# Patient Record
Sex: Female | Born: 1950 | Race: White | Hispanic: No | Marital: Married | Smoking: Never smoker
Health system: Southern US, Community
[De-identification: ages and names within clinical notes are randomized; demographics above are authoritative.]

## PROBLEM LIST (undated history)

## (undated) DIAGNOSIS — I509 Heart failure, unspecified: Secondary | ICD-10-CM

## (undated) DIAGNOSIS — F419 Anxiety disorder, unspecified: Secondary | ICD-10-CM

## (undated) DIAGNOSIS — F329 Major depressive disorder, single episode, unspecified: Secondary | ICD-10-CM

## (undated) DIAGNOSIS — Z973 Presence of spectacles and contact lenses: Secondary | ICD-10-CM

## (undated) DIAGNOSIS — Q760 Spina bifida occulta: Secondary | ICD-10-CM

## (undated) DIAGNOSIS — I10 Essential (primary) hypertension: Secondary | ICD-10-CM

## (undated) DIAGNOSIS — Z972 Presence of dental prosthetic device (complete) (partial): Secondary | ICD-10-CM

## (undated) DIAGNOSIS — G629 Polyneuropathy, unspecified: Secondary | ICD-10-CM

## (undated) DIAGNOSIS — M1990 Unspecified osteoarthritis, unspecified site: Secondary | ICD-10-CM

## (undated) DIAGNOSIS — Z9289 Personal history of other medical treatment: Secondary | ICD-10-CM

## (undated) DIAGNOSIS — N189 Chronic kidney disease, unspecified: Secondary | ICD-10-CM

## (undated) DIAGNOSIS — F32A Depression, unspecified: Secondary | ICD-10-CM

## (undated) DIAGNOSIS — E119 Type 2 diabetes mellitus without complications: Secondary | ICD-10-CM

## (undated) HISTORY — PX: OTHER SURGICAL HISTORY: SHX169

## (undated) HISTORY — PX: WISDOM TOOTH EXTRACTION: SHX21

## (undated) HISTORY — DX: Essential (primary) hypertension: I10

## (undated) HISTORY — DX: Type 2 diabetes mellitus without complications: E11.9

## (undated) HISTORY — DX: Spina bifida occulta: Q76.0

---

## 2014-07-08 ENCOUNTER — Encounter: Payer: Self-pay | Admitting: Nutrition

## 2014-07-08 ENCOUNTER — Encounter: Payer: Medicare Other | Attending: "Endocrinology | Admitting: Nutrition

## 2014-07-08 VITALS — Ht 66.0 in | Wt 247.0 lb

## 2014-07-08 DIAGNOSIS — E119 Type 2 diabetes mellitus without complications: Secondary | ICD-10-CM | POA: Insufficient documentation

## 2014-07-08 DIAGNOSIS — E1165 Type 2 diabetes mellitus with hyperglycemia: Secondary | ICD-10-CM

## 2014-07-08 DIAGNOSIS — E118 Type 2 diabetes mellitus with unspecified complications: Secondary | ICD-10-CM

## 2014-07-08 DIAGNOSIS — N183 Chronic kidney disease, stage 3 unspecified: Secondary | ICD-10-CM

## 2014-07-08 DIAGNOSIS — Z6839 Body mass index (BMI) 39.0-39.9, adult: Secondary | ICD-10-CM | POA: Insufficient documentation

## 2014-07-08 DIAGNOSIS — Z713 Dietary counseling and surveillance: Secondary | ICD-10-CM | POA: Insufficient documentation

## 2014-07-08 DIAGNOSIS — IMO0002 Reserved for concepts with insufficient information to code with codable children: Secondary | ICD-10-CM

## 2014-07-08 DIAGNOSIS — Z794 Long term (current) use of insulin: Secondary | ICD-10-CM | POA: Insufficient documentation

## 2014-07-08 NOTE — Progress Notes (Signed)
Medical Nutrition Therapy:  Appt start time: 1330 end time:  1500.  Assessment:  Primary concerns today: Diabetes. Most recent A1C was 7.8%.  Lost 35 bls in the past by following a very low carb high protein diet. 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 Has cut out one of her herbs that she feels may be making her kidneys worse. Is frustrated trying to figure out what to eat to help improve her blood sugars. Carbs are insuffient at meal times. Eats whole grains and fresh fruits and vegeables as she grow a garden and sells produce. Has eaten a lot more starchy vegetables and complex carbs over the winter months. Avoids processed foods and fast foods. Has Native [place] heritage.  Taking Levemir 20 units once a day accurately.  Most  meals are baked and broiled and grilled. Eats a lot of soups and stews. Grows a lot of her own vegetables.    BS 189-200's in am. Before lunch 140-160 and before supper 170-277 and bedtime in the upper 200's.    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    Has never been educated by an RD or CDE before. Diagnosed three years ago.    Has CKD and HTN. Doesn't eat a lot of salty or processed foods.  LDL elevated at 118 mg/dl. Cardiovascular risk factors.  Preferred Learning Style:  Auditory  Visual  Hands on  Learning Readiness:  Ready to make changes to improve her diabetes and cardiovascular risk.    Change in progress  MEDICATIONS: See list   DIETARY INTAKE:  24-hr recall:  B ( AM): 1-2 eggs with cheese or  Bacon, ezekiel bread, 1/2 of grapefruit. Snk ( AM): none  L ( PM): Toss salad, olives, avacado with dressing, with egg salad and 4 crackers, 4 shrimp, water and 1 cup hot tea sweetened stevia. Snk ( PM): none D ( PM): SHrimp , tomatoes, onions, and polenta 1/4 c, asparagus. 1/2 apple. Snk ( PM) :Beverages: water Usual physical activity:  gardening,  Estimated energy needs: 1600 calories 180 g carbohydrates 120 g protein 44 g fat  Progress Towards Goal(s):  In progress.   Nutritional Diagnosis:  NB-1.1 Food and nutrition-related knowledge deficit As related to Diabetes.  As evidenced by A1C 7.8%.    Intervention:  Nutrition counseling on diabetes disease, My Plate, CHO Counting, meal planning, portion control, treatment and s/s of hyper/hypoglycemia, complications of DM, target goals for BS and benefits of weight loss for improved BS control and need for cardio exercise of at least 30 minutes most days of the week.. Stressed need for a low fat high fiber low sodium diet based on cardiovascular risk factors. Goals: 1. Cut out graprefruit products as it may interfere with medications. 2. Eat 30-45 g Carbs per meal. Don't skip meals. Try to eat 4-5 hours apart. 3. Follow the Plate Method as discussed. 4. Increase physical activity to 30 minutes at least three times per week for needed weight loss. 5. Avoid snacks. 6. Measure foods out for accuracy of Carb intake. 7. Continue to test blood sugars as instructed. 8. Keep a food journal x  1 month. 9. Lose 1 lb per week 10. Get A1C down to 7% in 3-6 months.  Teaching Method Utilized:  Visual Auditory Hands on  Handouts given during visit include:  The Plate Method  The Carb Counting Book  Diabetes and You book  The Meal Plan Card  Barriers to learning/adherence to lifestyle change: none  Demonstrated degree of understanding via:  Teach Back   Monitoring/Evaluation:  Dietary intake, exercise, meal planning, food journal and body weight in 1 month(s).

## 2014-07-08 NOTE — Patient Instructions (Signed)
Goals: 1. Cut out graprefruit products as it may interfere with medications. 2. Eat 30-45 g Carbs per meal. Don't skip meals. Try to eat 4-5 hours apart. 3. Follow the Plate Method as discussed. 4. Increase physical activity to 30 minutes at least three times per week for needed weight loss. 5. Avoid snacks. 6. Measure foods out for accuracy of Carb intake. 7. Continue to test blood sugars as instructed. 8. Keep a food journal x  1 month. 9. Lose 1 lb per week 10. Get A1C down to 7% in 3-6 months.

## 2014-07-16 ENCOUNTER — Ambulatory Visit: Payer: Medicare Other | Admitting: Nutrition

## 2014-07-31 ENCOUNTER — Ambulatory Visit: Payer: Medicare Other | Admitting: Nutrition

## 2014-08-20 ENCOUNTER — Encounter: Payer: Medicare Other | Attending: "Endocrinology | Admitting: Nutrition

## 2014-08-20 VITALS — Ht 66.0 in | Wt 243.5 lb

## 2014-08-20 DIAGNOSIS — Z713 Dietary counseling and surveillance: Secondary | ICD-10-CM | POA: Insufficient documentation

## 2014-08-20 DIAGNOSIS — E1165 Type 2 diabetes mellitus with hyperglycemia: Secondary | ICD-10-CM

## 2014-08-20 DIAGNOSIS — Z794 Long term (current) use of insulin: Secondary | ICD-10-CM | POA: Insufficient documentation

## 2014-08-20 DIAGNOSIS — E118 Type 2 diabetes mellitus with unspecified complications: Secondary | ICD-10-CM

## 2014-08-20 DIAGNOSIS — N183 Chronic kidney disease, stage 3 (moderate): Secondary | ICD-10-CM | POA: Insufficient documentation

## 2014-08-20 DIAGNOSIS — E119 Type 2 diabetes mellitus without complications: Secondary | ICD-10-CM | POA: Diagnosis present

## 2014-08-20 DIAGNOSIS — Z6839 Body mass index (BMI) 39.0-39.9, adult: Secondary | ICD-10-CM | POA: Insufficient documentation

## 2014-08-20 DIAGNOSIS — IMO0002 Reserved for concepts with insufficient information to code with codable children: Secondary | ICD-10-CM

## 2014-08-20 NOTE — Patient Instructions (Signed)
Goals: 1. Increase more low carb vegetables with meals. 2. Eat 30-45 g Carbs per meal. Don't skip meals. Try to eat 4-5 hours apart. 3. Follow the Plate Method as discussed. 4. Increase physical activity to 30 minutes at least three times per week for needed weight loss. 5. Avoid snacks. 6. Chose apple or other fresh fruits intead of grapes with meal to see if it will help blood sugars. 7. Get A1C down to 7% in three months. 8. Keep testing blood sugars as instructed and take meds and insulin as prescribed.

## 2014-08-20 NOTE — Progress Notes (Signed)
  Medical Nutrition Therapy:  Appt start time: 0815end time:  0845 Assessment:  Primary concerns today: Diabetes follow up. I have been trying to do better. 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 I found out licorice herb makes blood sugars go up. 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 Eating better balanced meals more consistently now.  Most recent A1C was 8.2%.,  Lost 4 lbs. Doesn't like the metformin-thinks it causes her constipation and nausea.. Left ankle issues. FBS log brought in. BS higher at night before bed. Metformin and Levemir 30 units at night.  Food journal reveals she is eating better protein with meals and more consistent CHO. Needs more lower carb veggies and not just lettuce and cucumbers. PHysically  Busy working in [employer] but not exercising otherwise.    Has CKD and HTN.   Preferred Learning Style:  Auditory  Visual  Hands on  Learning Readiness:  Ready to make changes to improve her diabetes and cardiovascular risk.    Change in progress  MEDICATIONS: See list   DIETARY INTAKE:  24-hr recall:  B ( AM): 1 egg, a whole grain toast, and some fruit and occassional piece of homemade sausage. Snk ( AM): none  L ( PM):  Snk ( PM): none D ( PM): SHrimp , tomatoes, onions, and polenta 1/4 c, asparagus. 1/2 apple. Snk ( PM) :Beverages: water Usual physical activity: gardening,  Estimated energy needs: 1600 calories 180 g carbohydrates 120 g protein 44 g fat  Progress Towards Goal(s):  In progress.   Nutritional Diagnosis:  NB-1.1 Food and nutrition-related knowledge deficit As related to Diabetes.  As evidenced by A1C 7.8%.    Intervention:  Nutrition counseling on diabetes disease, My Plate, CHO Counting, meal planning, portion control, treatment and s/s of hyper/hypoglycemia, complications of DM, target goals for BS and benefits of weight loss for improved BS control and need for cardio exercise of at least 30 minutes most days of the  week.. Stressed need for a low fat high fiber low sodium diet based on cardiovascular risk factors. Goals: 1. Increase more low carb vegetables with meals. 2. Eat 30-45 g Carbs per meal. Don't skip meals. Try to eat 4-5 hours apart. 3. Follow the Plate Method as discussed. 4. Increase physical activity to 30 minutes at least three times per week for needed weight loss. 5. Avoid snacks. 6. Chose apple or other fresh fruits intead of grapes with meal to see if it will help blood sugars. 7. Get A1C down to 7% in three months. 8. Keep testing blood sugars as instructed and take meds and insulin as prescribed.  Teaching Method Utilized:  Visual Auditory Hands on  Handouts given during visit include:  The Plate Method  The Carb Counting Book  Diabetes and You book  The Meal Plan Card  Barriers to learning/adherence to lifestyle change: none  Demonstrated degree of understanding via:  Teach Back   Monitoring/Evaluation:  Dietary intake, exercise, meal planning, food journal and body weight in 1 month(s).

## 2015-07-10 HISTORY — PX: OTHER SURGICAL HISTORY: SHX169

## 2015-08-03 ENCOUNTER — Ambulatory Visit (INDEPENDENT_AMBULATORY_CARE_PROVIDER_SITE_OTHER): Payer: Medicare Other | Admitting: Orthopaedic Surgery

## 2015-08-03 ENCOUNTER — Encounter: Payer: Self-pay | Admitting: Orthopaedic Surgery

## 2015-08-03 ENCOUNTER — Ambulatory Visit (INDEPENDENT_AMBULATORY_CARE_PROVIDER_SITE_OTHER): Payer: Medicare Other

## 2015-08-03 VITALS — BP 205/87 | HR 79 | Temp 98.2°F | Ht 68.0 in | Wt 260.0 lb

## 2015-08-03 DIAGNOSIS — M25561 Pain in right knee: Secondary | ICD-10-CM

## 2015-08-03 NOTE — Progress Notes (Signed)
Subjective:    Patient ID: Natalie Friedman, female    DOB: 03-May-1950, 65 y.o.   MRN: [id number]  Knee Pain  The incident occurred more than 1 week ago. The incident occurred at home. The injury mechanism was a twisting injury. The pain is present in the right knee. The quality of the pain is described as aching. The pain is at a severity of 5/10. The pain is moderate. The pain has been worsening since onset. Associated symptoms include an inability to bear weight and a loss of motion. Pertinent negatives include no loss of sensation, muscle weakness, numbness or tingling. The symptoms are aggravated by weight bearing and movement. She has tried heat, immobilization, ice, non-weight bearing, rest and NSAIDs for the symptoms. The treatment provided mild relief.   She had a twisting injury and knee pain on the right in October, 2016.  She was seen by chiropractor and accupunturist.  She had continued pain.  She took Tylenol and used a cane.  Her knee got better by mid January but still hurt with any type of squatting.  She raises herbs on a farm.  Her pain came back several weeks ago.  She was seen in [place] and had x-rays and was told she needed surgery on the knee.  She did not bring in the CD disk of the x-rays.  She has no new trauma, no redness.  She has problems with full extension at times and has feeling of giving way but not actually giving way.  She is getting worse.  She is a diabetic on insulin.  Her last A1C was 7.5.  She had blood drawn this morning.  Her blood sugar was 79 this morning.  She says it is well controlled.  She also has hypertension well controlled.  Review of Systems  HENT: Negative for congestion.   Respiratory: Negative for cough and shortness of breath.   Cardiovascular: Negative for chest pain and leg swelling.  Endocrine: Positive for cold intolerance.  Musculoskeletal: Positive for joint swelling, arthralgias and gait problem.  Allergic/Immunologic: Positive for  environmental allergies.  Neurological: Negative for tingling and numbness.   Past Medical History  Diagnosis Date  . Diabetes mellitus without complication ([place])   . Hypertension   . Occult spina bifida     Past Surgical History  Procedure Laterality Date  . Ectopic      Current Outpatient Prescriptions on File Prior to Visit  Medication Sig Dispense Refill  . insulin detemir (LEVEMIR) 100 UNIT/ML injection Inject 20 Units into the skin at bedtime.    . Omega 3 1000 MG CAPS Take 1,000 mg by mouth.     No current facility-administered medications on file prior to visit.    Social History   Social History  . Marital Status: Unknown    Spouse Name: N/A  . Number of Children: N/A  . Years of Education: N/A   Occupational History  . Not on file.   Social History Main Topics  . Smoking status: Never Smoker   . Smokeless tobacco: Never Used  . Alcohol Use: No  . Drug Use: No  . Sexual Activity: Not on file   Other Topics Concern  . Not on file   Social History Narrative    BP 205/87 mmHg  Pulse 79  Temp(Src) 98.2 F (36.8 C)  Ht 5\' 8"  (1.727 m)  Wt 260 lb (117.935 kg)  BMI 39.54 kg/m2     Objective:   Physical Exam  Constitutional:  She is oriented to person, place, and time. She appears well-developed and well-nourished.  HENT:  Head: Normocephalic and atraumatic.  Eyes: Conjunctivae and EOM are normal. Pupils are equal, round, and reactive to light.  Neck: Normal range of motion. Neck supple.  Cardiovascular: Normal rate, regular rhythm and intact distal pulses.   Pulmonary/Chest: Effort normal.  Abdominal: Soft.  Musculoskeletal: She exhibits tenderness (Pain right knee with ROM 5 to 100 with crepitus and lateral pain.  NV intact.  1+ effusion.  Limp to the right.  Uses a cane.).       Right knee: She exhibits decreased range of motion and effusion. Tenderness found. Lateral joint line tenderness noted.       Legs: Neurological: She is alert and  oriented to person, place, and time. She displays normal reflexes. No cranial nerve deficit. She exhibits normal muscle tone. Coordination normal.  Skin: Skin is warm and dry.  Psychiatric: She has a normal mood and affect. Her behavior is normal. Judgment and thought content normal.    X-rays were done of the right knee and reported separately.  PROCEDURE NOTE:  The patient requests injections of the right knee , verbal consent was obtained.  The right knee was prepped appropriately after time out was performed.   Sterile technique was observed and injection of 1 cc of Depo-Medrol 40 mg with several cc's of plain xylocaine. Anesthesia was provided by ethyl chloride and a 20-gauge needle was used to inject the knee area. The injection was tolerated well.  A band aid dressing was applied.  The patient was advised to apply ice later today and tomorrow to the injection sight as needed.      Assessment & Plan:   Encounter Diagnosis  Name Primary?  . Right knee pain Yes   I told her and showed her the x-rays of the right knee and the findings.  She is wearing out the lateral side of the knee.  She is to get Medicare supplemental insurance and does not want any MRI or consideration of any knee surgery until she obtains that.  I injected the knee.  She has Ultram at home and will start that.  Return to clinic in two weeks.  Call if any problem  Precautions given.

## 2015-08-10 ENCOUNTER — Ambulatory Visit (INDEPENDENT_AMBULATORY_CARE_PROVIDER_SITE_OTHER): Payer: Medicare Other | Admitting: Orthopaedic Surgery

## 2015-08-10 VITALS — BP 191/105 | HR 85 | Temp 100.2°F | Ht 68.0 in | Wt 260.0 lb

## 2015-08-10 DIAGNOSIS — M25561 Pain in right knee: Secondary | ICD-10-CM | POA: Diagnosis not present

## 2015-08-10 NOTE — Patient Instructions (Signed)
Get MRI of the right knee

## 2015-08-10 NOTE — Progress Notes (Signed)
Patient Natalie Friedman, female DOB:12/15/1950, 65 y.o. [id number]  Chief Complaint  Patient presents with  . Follow-up    Right knee pain fell again 08/05/15 and 08/08/15    HPI  Natalie Friedman is a 65 y.o. female who has right knee pain.  I gave her an injection about a week ago. She went out in the garden and tripped over a vine in the ground and fell on the right knee and re-injured it.  She twisted as she fell.  The knee is more painful.  She cannot fully extend the knee now.  She has pain when trying to stand.  She is worse.  She is taking her medicine.  She has no other injury.  She is very painful over the medial joint line.  She has effusion.  I will order a MRI.  HPI  Body mass index is 39.54 kg/(m^2).   Review of Systems  HENT: Negative for congestion.   Respiratory: Negative for cough and shortness of breath.   Cardiovascular: Negative for chest pain and leg swelling.  Endocrine: Positive for cold intolerance.  Musculoskeletal: Positive for joint swelling, arthralgias and gait problem.  Allergic/Immunologic: Positive for environmental allergies.  Neurological: Negative for numbness.    Past Medical History  Diagnosis Date  . Diabetes mellitus without complication ([employer])   . Hypertension   . Occult spina bifida     Past Surgical History  Procedure Laterality Date  . Ectopic      No family history on file.  Social History Social History  Substance Use Topics  . Smoking status: Never Smoker   . Smokeless tobacco: Never Used  . Alcohol Use: No    No Known Allergies  Current Outpatient Prescriptions  Medication Sig Dispense Refill  . amLODipine (NORVASC) 10 MG tablet   0  . insulin detemir (LEVEMIR) 100 UNIT/ML injection Inject 20 Units into the skin at bedtime.    . Omega 3 1000 MG CAPS Take 1,000 mg by mouth.    . traMADol (ULTRAM) 50 MG tablet Take 50 mg by mouth every 6 (six) hours as needed.     No current facility-administered medications for this  visit.     Physical Exam  Blood pressure 191/105, pulse 85, temperature 100.2 F (37.9 C), height 5\' 8"  (1.727 m), weight 260 lb (117.935 kg).  Constitutional: overall normal hygiene, normal nutrition, well developed, normal grooming, normal body habitus. Assistive device:wheelchair  Musculoskeletal: gait and station Limp on right, difficult to stand, muscle tone and strength are normal, no tremors or atrophy is present.  .  Neurological: coordination overall normal.  Deep tendon reflex/nerve stretch intact.  Sensation normal.  Cranial nerves II-XII intact.   Skin:   normal overall no scars, lesions, ulcers or rashes. No psoriasis.  Psychiatric: Alert and oriented x 3.  Recent memory intact, remote memory unclear.  Normal mood and affect. Well groomed.  Good eye contact.  Cardiovascular: overall no swelling, no varicosities, no edema bilaterally, normal temperatures of the legs and arms, no clubbing, cyanosis and good capillary refill.  Lymphatic: palpation is normal.  The right lower extremity is examined:  Inspection:  Thigh:  Non-tender and no defects  Knee has swelling 2+ effusion.                        Joint tenderness is present  Patient is tender over the medial joint line  Lower Leg:  Has normal appearance and no tenderness or defects  Ankle:  Non-tender and no defects  Foot:  Non-tender and no defects Range of Motion:  Knee:  Range of motion is: -5 to 85 with pain, very tender.                        Crepitus is  present  Ankle:  Range of motion is normal. Strength and Tone:  The left lower extremity has normal strength and tone. Stability:  Knee:  The knee has positive medial McMurray.  Ankle:  The ankle is stable.  Left knee negative.  The patient has been educated about the nature of the problem(s) and counseled on treatment options.  The patient appeared to understand what I have discussed and is in agreement with it.  Encounter  Diagnosis  Name Primary?  . Right knee pain Yes    PLAN Call if any problems.  Precautions discussed.  Continue current medications.   Return to clinic after MRI of the right knee.  She has hurt it again and now has lack of full extension and more pain and more swelling.

## 2015-08-17 ENCOUNTER — Ambulatory Visit: Payer: Medicare Other | Admitting: Orthopaedic Surgery

## 2015-08-18 ENCOUNTER — Ambulatory Visit (HOSPITAL_COMMUNITY)
Admission: RE | Admit: 2015-08-18 | Discharge: 2015-08-18 | Disposition: A | Discharge status: Home / Self Care | Payer: Medicare Other | Source: Ambulatory Visit | Attending: Orthopaedic Surgery | Admitting: Orthopaedic Surgery

## 2015-08-18 DIAGNOSIS — M1711 Unilateral primary osteoarthritis, right knee: Secondary | ICD-10-CM | POA: Diagnosis not present

## 2015-08-18 DIAGNOSIS — M25561 Pain in right knee: Secondary | ICD-10-CM | POA: Diagnosis present

## 2015-08-18 DIAGNOSIS — M25461 Effusion, right knee: Secondary | ICD-10-CM | POA: Insufficient documentation

## 2015-08-18 DIAGNOSIS — S82144A Nondisplaced bicondylar fracture of right tibia, initial encounter for closed fracture: Secondary | ICD-10-CM | POA: Diagnosis not present

## 2015-08-18 DIAGNOSIS — S83281A Other tear of lateral meniscus, current injury, right knee, initial encounter: Secondary | ICD-10-CM | POA: Diagnosis not present

## 2015-08-18 DIAGNOSIS — X58XXXA Exposure to other specified factors, initial encounter: Secondary | ICD-10-CM | POA: Diagnosis not present

## 2015-08-19 ENCOUNTER — Encounter: Payer: Self-pay | Admitting: Orthopaedic Surgery

## 2015-08-19 ENCOUNTER — Ambulatory Visit (INDEPENDENT_AMBULATORY_CARE_PROVIDER_SITE_OTHER): Payer: Medicare Other | Admitting: Orthopaedic Surgery

## 2015-08-19 VITALS — BP 197/95 | HR 76 | Temp 98.1°F | Ht 68.0 in | Wt 260.0 lb

## 2015-08-19 DIAGNOSIS — M25561 Pain in right knee: Secondary | ICD-10-CM

## 2015-08-19 DIAGNOSIS — S82141A Displaced bicondylar fracture of right tibia, initial encounter for closed fracture: Secondary | ICD-10-CM

## 2015-08-19 DIAGNOSIS — E119 Type 2 diabetes mellitus without complications: Secondary | ICD-10-CM

## 2015-08-19 DIAGNOSIS — I10 Essential (primary) hypertension: Secondary | ICD-10-CM | POA: Diagnosis not present

## 2015-08-19 DIAGNOSIS — E669 Obesity, unspecified: Secondary | ICD-10-CM

## 2015-08-19 NOTE — Patient Instructions (Signed)
Stay off the right knee.  Use walker or crutches  X-rays of the right knee on return.

## 2015-08-19 NOTE — Progress Notes (Signed)
Patient Natalie Friedman, female DOB:1950-04-17, 64 y.o. [id number]  Chief Complaint  Patient presents with  . Follow-up    Review MRI of right knee    HPI  Natalie Friedman is a 65 y.o. female who had a fall to the right knee and knee pain.  She fell two weeks ago.  Her pain continued and was marked at times.  I had her get a MRI.  It was done yesterday.  Based on the report, she was called in today to be seen.  The MRI shows:  IMPRESSION: 1. Nondisplaced fracture of the anterior 2/3 of the medial tibial plateau without significant step-off. Extensive surrounding marrow edema. 2. Severe osteoarthritis. 3. Degenerative tearing of the lateral meniscus in the anterior horn and probably at the posterior root. 4. Abnormally thickened MCL with some adjacent edema. This could be from chronic remote tear or grade 2 sprain. 5. Severe chondral thinning in the lateral compartment. Moderate medial compartmental chondral thinning with some focal full-thickness cartilage loss. Patellofemoral cartilage cannot be readily assessed due to artifact. 6. Moderate knee effusion.  I went over each item of the MRI report and explained it to her in detail  I used a model to show her.  She asked appropriate questions.  She will stay off the leg and use walker or crutches.  She understands the significance of the injury.  It is stable but she needs to be careful.  Her diabetes controlled nicely.  Her hypertension is controlled.  She was unable to take Toradol and is taking Tylenol and herbs.  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 HPI  Body mass index is 39.54 kg/(m^2).   Review of Systems  HENT: Negative for congestion.   Respiratory: Negative for cough and shortness of breath.   Cardiovascular: Negative for chest pain and leg swelling.  Endocrine: Positive for cold intolerance.  Musculoskeletal: Positive for joint swelling, arthralgias and gait problem.  Allergic/Immunologic: Positive for environmental  allergies.  Neurological: Negative for numbness.    Past Medical History  Diagnosis Date  . Diabetes mellitus without complication ([place])   . Hypertension   . Occult spina bifida     Past Surgical History  Procedure Laterality Date  . Ectopic      History reviewed. No pertinent family history.  Social History Social History  Substance Use Topics  . Smoking status: Never Smoker   . Smokeless tobacco: Never Used  . Alcohol Use: No    No Known Allergies  Current Outpatient Prescriptions  Medication Sig Dispense Refill  . amLODipine (NORVASC) 10 MG tablet   0  . insulin detemir (LEVEMIR) 100 UNIT/ML injection Inject 20 Units into the skin at bedtime.    . Omega 3 1000 MG CAPS Take 1,000 mg by mouth.    . traMADol (ULTRAM) 50 MG tablet Take 50 mg by mouth every 6 (six) hours as needed. Reported on 08/19/2015     No current facility-administered medications for this visit.     Physical Exam  Blood pressure 197/95, pulse 76, temperature 98.1 F (36.7 C), height 5\' 8"  (1.727 m), weight 260 lb (117.935 kg).  Constitutional: overall normal hygiene, normal nutrition, well developed, normal grooming, normal body habitus. Assistive device:wheelchair  Musculoskeletal: gait and station Limp right, muscle tone and strength are normal, no tremors or atrophy is present.  .  Neurological: coordination overall normal.  Deep tendon reflex/nerve stretch intact.  Sensation normal.  Cranial nerves II-XII intact.   Skin:   normal overall no scars, lesions,  ulcers or rashes. No psoriasis.  Psychiatric: Alert and oriented x 3.  Recent memory intact, remote memory unclear.  Normal mood and affect. Well groomed.  Good eye contact.  Cardiovascular: overall no swelling, no varicosities, no edema bilaterally, normal temperatures of the legs and arms, no clubbing, cyanosis and good capillary refill.  Lymphatic: palpation is normal.  The right lower extremity is  examined:  Inspection:  Thigh:  Non-tender and no defects  Knee has swelling 2+ effusion.                        Joint tenderness is present                        Patient is tender over the medial joint line  Lower Leg:  Has normal appearance and no tenderness or defects  Ankle:  Non-tender and no defects  Foot:  Non-tender and no defects Range of Motion:  Knee:  Range of motion is: 0-100                        Crepitus is  present  Ankle:  Range of motion is normal. Strength and Tone:  The right lower extremity has normal strength and tone. Stability:  Knee:  The knee is stable.  Ankle:  The ankle is stable.   The left knee is negative.  The patient has been educated about the nature of the problem(s) and counseled on treatment options.  The patient appeared to understand what I have discussed and is in agreement with it.  Encounter Diagnoses  Name Primary?  . Tibial plateau fracture, right, closed, initial encounter Yes  . Right knee pain   . Diabetes mellitus without complication ([place])   . Essential hypertension   . Obesity     PLAN Call if any problems.  Precautions discussed.  Continue current medications.   Return to clinic 2 weeks  X-rays of the right knee on return. Use walker or crutches. Stay off right leg.

## 2015-08-24 ENCOUNTER — Ambulatory Visit: Payer: Medicare Other | Admitting: Orthopaedic Surgery

## 2015-09-02 ENCOUNTER — Encounter: Payer: Self-pay | Admitting: Orthopaedic Surgery

## 2015-09-02 ENCOUNTER — Ambulatory Visit: Payer: Medicare Other | Admitting: Orthopedic Surgery

## 2015-09-02 ENCOUNTER — Ambulatory Visit (INDEPENDENT_AMBULATORY_CARE_PROVIDER_SITE_OTHER): Payer: Medicare Other | Admitting: Orthopaedic Surgery

## 2015-09-02 ENCOUNTER — Ambulatory Visit (INDEPENDENT_AMBULATORY_CARE_PROVIDER_SITE_OTHER): Payer: Medicare Other

## 2015-09-02 VITALS — BP 148/84 | HR 84 | Temp 99.0°F | Ht 68.0 in | Wt 260.0 lb

## 2015-09-02 DIAGNOSIS — S82141D Displaced bicondylar fracture of right tibia, subsequent encounter for closed fracture with routine healing: Secondary | ICD-10-CM

## 2015-09-02 NOTE — Progress Notes (Signed)
CC:  My knee is better  She has a healing medial tibial plateau fracture nondisplaced on the right doing well.  She has little pain.  She has no new trauma.  ROM is 0 to 110.  NV is intact.  Encounter Diagnosis  Name Primary?  . Tibial plateau fracture, right, closed, with routine healing, subsequent encounter Yes   Return in one month.  Continue no weight bearing.  X-rays on return.  Call if any problem.  Precautions discussed.

## 2015-09-16 DIAGNOSIS — E119 Type 2 diabetes mellitus without complications: Secondary | ICD-10-CM | POA: Diagnosis not present

## 2015-09-16 DIAGNOSIS — H25013 Cortical age-related cataract, bilateral: Secondary | ICD-10-CM | POA: Diagnosis not present

## 2015-09-30 ENCOUNTER — Ambulatory Visit (INDEPENDENT_AMBULATORY_CARE_PROVIDER_SITE_OTHER): Payer: Medicaid Other

## 2015-09-30 ENCOUNTER — Encounter: Payer: Self-pay | Admitting: Orthopaedic Surgery

## 2015-09-30 ENCOUNTER — Ambulatory Visit: Payer: Medicare Other | Admitting: Orthopaedic Surgery

## 2015-09-30 VITALS — BP 175/92 | HR 76 | Temp 98.1°F

## 2015-09-30 DIAGNOSIS — S82141D Displaced bicondylar fracture of right tibia, subsequent encounter for closed fracture with routine healing: Secondary | ICD-10-CM

## 2015-09-30 DIAGNOSIS — M25561 Pain in right knee: Secondary | ICD-10-CM

## 2015-09-30 DIAGNOSIS — E669 Obesity, unspecified: Secondary | ICD-10-CM

## 2015-09-30 DIAGNOSIS — E119 Type 2 diabetes mellitus without complications: Secondary | ICD-10-CM

## 2015-09-30 DIAGNOSIS — I10 Essential (primary) hypertension: Secondary | ICD-10-CM

## 2015-09-30 NOTE — Progress Notes (Signed)
CC:  My knee is better  She has right knee pain that is improving.  She still lacks about 3 to 5 degrees of full extension of the knee.  She has less effusion.  She has no new trauma.  X-rays were done and reported separately.  Encounter Diagnoses  Name Primary?  . Right knee pain Yes  . Tibial plateau fracture, right, closed, with routine healing, subsequent encounter   . Essential hypertension   . Diabetes mellitus without complication ([place])   . Obesity     I have told her to begin weight bearing as tolerated.  Consider water therapy.  Begin PT for the knee.  She will need a total knee in the near future.  We talked about it and risks and imponderables.  She is planning to have it done.  Return in two weeks.  Call if any problem  Precautions discussed.  Electronically Signed Sanjuana Kava, MD 6/22/20174:04 PM

## 2015-10-06 ENCOUNTER — Telehealth: Payer: Self-pay

## 2015-10-06 DIAGNOSIS — M25661 Stiffness of right knee, not elsewhere classified: Secondary | ICD-10-CM | POA: Diagnosis not present

## 2015-10-06 DIAGNOSIS — M25561 Pain in right knee: Secondary | ICD-10-CM | POA: Diagnosis not present

## 2015-10-06 DIAGNOSIS — S82124S Nondisplaced fracture of lateral condyle of right tibia, sequela: Secondary | ICD-10-CM | POA: Diagnosis not present

## 2015-10-06 DIAGNOSIS — M62451 Contracture of muscle, right thigh: Secondary | ICD-10-CM | POA: Diagnosis not present

## 2015-10-14 ENCOUNTER — Ambulatory Visit: Payer: Medicare Other | Admitting: Orthopaedic Surgery

## 2015-10-14 DIAGNOSIS — M62451 Contracture of muscle, right thigh: Secondary | ICD-10-CM | POA: Diagnosis not present

## 2015-10-14 DIAGNOSIS — M25561 Pain in right knee: Secondary | ICD-10-CM | POA: Diagnosis not present

## 2015-10-14 DIAGNOSIS — M25661 Stiffness of right knee, not elsewhere classified: Secondary | ICD-10-CM | POA: Diagnosis not present

## 2015-10-14 DIAGNOSIS — S82124S Nondisplaced fracture of lateral condyle of right tibia, sequela: Secondary | ICD-10-CM | POA: Diagnosis not present

## 2015-10-18 DIAGNOSIS — S82124S Nondisplaced fracture of lateral condyle of right tibia, sequela: Secondary | ICD-10-CM | POA: Diagnosis not present

## 2015-10-18 DIAGNOSIS — M25661 Stiffness of right knee, not elsewhere classified: Secondary | ICD-10-CM | POA: Diagnosis not present

## 2015-10-18 DIAGNOSIS — M25561 Pain in right knee: Secondary | ICD-10-CM | POA: Diagnosis not present

## 2015-10-18 DIAGNOSIS — M62451 Contracture of muscle, right thigh: Secondary | ICD-10-CM | POA: Diagnosis not present

## 2015-10-19 DIAGNOSIS — M62451 Contracture of muscle, right thigh: Secondary | ICD-10-CM | POA: Diagnosis not present

## 2015-10-19 DIAGNOSIS — M25561 Pain in right knee: Secondary | ICD-10-CM | POA: Diagnosis not present

## 2015-10-19 DIAGNOSIS — M25661 Stiffness of right knee, not elsewhere classified: Secondary | ICD-10-CM | POA: Diagnosis not present

## 2015-10-19 DIAGNOSIS — S82124S Nondisplaced fracture of lateral condyle of right tibia, sequela: Secondary | ICD-10-CM | POA: Diagnosis not present

## 2015-10-20 ENCOUNTER — Encounter: Payer: Self-pay | Admitting: Orthopaedic Surgery

## 2015-10-20 ENCOUNTER — Ambulatory Visit: Payer: Medicare Other | Admitting: Orthopaedic Surgery

## 2015-10-20 VITALS — BP 137/89 | HR 84 | Temp 98.4°F | Ht 65.0 in | Wt 256.0 lb

## 2015-10-20 DIAGNOSIS — M25561 Pain in right knee: Secondary | ICD-10-CM

## 2015-10-20 DIAGNOSIS — I10 Essential (primary) hypertension: Secondary | ICD-10-CM

## 2015-10-20 DIAGNOSIS — E119 Type 2 diabetes mellitus without complications: Secondary | ICD-10-CM

## 2015-10-20 DIAGNOSIS — S82141D Displaced bicondylar fracture of right tibia, subsequent encounter for closed fracture with routine healing: Secondary | ICD-10-CM

## 2015-10-20 DIAGNOSIS — E669 Obesity, unspecified: Secondary | ICD-10-CM

## 2015-10-20 NOTE — Progress Notes (Signed)
CC:  I like therapy.  I am better  Her right knee has better motion after going to PT. She still has a ways to go however.  She is using her [place].  She still lacks full extension of the right knee by about 5 degrees actively, she can go to full extension with passive help.  NV is intact.  Encounter Diagnoses  Name Primary?  . Right knee pain Yes  . Tibial plateau fracture, right, closed, with routine healing, subsequent encounter   . Essential hypertension   . Diabetes mellitus without complication ([place])   . Obesity    Continue PT.    I will see her in two weeks.  Call if any problem.  Precautions discussed.  Electronically Signed Sanjuana Kava, MD 7/12/20172:37 PM

## 2015-10-20 NOTE — Patient Instructions (Addendum)
Continue therapy.  Continue to work on straightening the leg.

## 2015-10-21 DIAGNOSIS — M62451 Contracture of muscle, right thigh: Secondary | ICD-10-CM | POA: Diagnosis not present

## 2015-10-21 DIAGNOSIS — S82124S Nondisplaced fracture of lateral condyle of right tibia, sequela: Secondary | ICD-10-CM | POA: Diagnosis not present

## 2015-10-21 DIAGNOSIS — M25561 Pain in right knee: Secondary | ICD-10-CM | POA: Diagnosis not present

## 2015-10-21 DIAGNOSIS — M25661 Stiffness of right knee, not elsewhere classified: Secondary | ICD-10-CM | POA: Diagnosis not present

## 2015-10-25 DIAGNOSIS — S82124S Nondisplaced fracture of lateral condyle of right tibia, sequela: Secondary | ICD-10-CM | POA: Diagnosis not present

## 2015-10-25 DIAGNOSIS — M62451 Contracture of muscle, right thigh: Secondary | ICD-10-CM | POA: Diagnosis not present

## 2015-10-25 DIAGNOSIS — M25561 Pain in right knee: Secondary | ICD-10-CM | POA: Diagnosis not present

## 2015-10-25 DIAGNOSIS — M25661 Stiffness of right knee, not elsewhere classified: Secondary | ICD-10-CM | POA: Diagnosis not present

## 2015-10-26 DIAGNOSIS — M25561 Pain in right knee: Secondary | ICD-10-CM | POA: Diagnosis not present

## 2015-10-26 DIAGNOSIS — M25661 Stiffness of right knee, not elsewhere classified: Secondary | ICD-10-CM | POA: Diagnosis not present

## 2015-10-26 DIAGNOSIS — S82124S Nondisplaced fracture of lateral condyle of right tibia, sequela: Secondary | ICD-10-CM | POA: Diagnosis not present

## 2015-10-26 DIAGNOSIS — M62451 Contracture of muscle, right thigh: Secondary | ICD-10-CM | POA: Diagnosis not present

## 2015-10-28 DIAGNOSIS — M62451 Contracture of muscle, right thigh: Secondary | ICD-10-CM | POA: Diagnosis not present

## 2015-10-28 DIAGNOSIS — S82124S Nondisplaced fracture of lateral condyle of right tibia, sequela: Secondary | ICD-10-CM | POA: Diagnosis not present

## 2015-10-28 DIAGNOSIS — M25561 Pain in right knee: Secondary | ICD-10-CM | POA: Diagnosis not present

## 2015-10-28 DIAGNOSIS — M25661 Stiffness of right knee, not elsewhere classified: Secondary | ICD-10-CM | POA: Diagnosis not present

## 2015-11-01 DIAGNOSIS — M62451 Contracture of muscle, right thigh: Secondary | ICD-10-CM | POA: Diagnosis not present

## 2015-11-01 DIAGNOSIS — M25561 Pain in right knee: Secondary | ICD-10-CM | POA: Diagnosis not present

## 2015-11-01 DIAGNOSIS — S82124S Nondisplaced fracture of lateral condyle of right tibia, sequela: Secondary | ICD-10-CM | POA: Diagnosis not present

## 2015-11-01 DIAGNOSIS — M25661 Stiffness of right knee, not elsewhere classified: Secondary | ICD-10-CM | POA: Diagnosis not present

## 2015-11-02 DIAGNOSIS — M25661 Stiffness of right knee, not elsewhere classified: Secondary | ICD-10-CM | POA: Diagnosis not present

## 2015-11-02 DIAGNOSIS — N183 Chronic kidney disease, stage 3 (moderate): Secondary | ICD-10-CM | POA: Diagnosis not present

## 2015-11-02 DIAGNOSIS — E782 Mixed hyperlipidemia: Secondary | ICD-10-CM | POA: Diagnosis not present

## 2015-11-02 DIAGNOSIS — M25561 Pain in right knee: Secondary | ICD-10-CM | POA: Diagnosis not present

## 2015-11-02 DIAGNOSIS — E119 Type 2 diabetes mellitus without complications: Secondary | ICD-10-CM | POA: Diagnosis not present

## 2015-11-02 DIAGNOSIS — S82124S Nondisplaced fracture of lateral condyle of right tibia, sequela: Secondary | ICD-10-CM | POA: Diagnosis not present

## 2015-11-02 DIAGNOSIS — M62451 Contracture of muscle, right thigh: Secondary | ICD-10-CM | POA: Diagnosis not present

## 2015-11-02 DIAGNOSIS — I10 Essential (primary) hypertension: Secondary | ICD-10-CM | POA: Diagnosis not present

## 2015-11-03 ENCOUNTER — Encounter: Payer: Self-pay | Admitting: Orthopaedic Surgery

## 2015-11-03 ENCOUNTER — Ambulatory Visit: Payer: Medicare Other | Admitting: Orthopaedic Surgery

## 2015-11-03 VITALS — BP 170/85 | HR 82 | Temp 98.1°F | Ht 65.0 in | Wt 251.0 lb

## 2015-11-03 DIAGNOSIS — E669 Obesity, unspecified: Secondary | ICD-10-CM

## 2015-11-03 DIAGNOSIS — M25561 Pain in right knee: Secondary | ICD-10-CM

## 2015-11-03 DIAGNOSIS — E119 Type 2 diabetes mellitus without complications: Secondary | ICD-10-CM

## 2015-11-03 DIAGNOSIS — S82141D Displaced bicondylar fracture of right tibia, subsequent encounter for closed fracture with routine healing: Secondary | ICD-10-CM

## 2015-11-03 DIAGNOSIS — I10 Essential (primary) hypertension: Secondary | ICD-10-CM

## 2015-11-03 NOTE — Progress Notes (Signed)
CC:  My knee is better  She is going to PT and has improved.  She lacks about 15 degrees from full extension of the right knee.  She is riding a stationary bicycle now where before she could not.  Her pain is less.  ROM of right knee is 15 to 90 with some pain.  She has an effusion and edema of both lower legs.  NV is intact.  Encounter Diagnoses  Name Primary?  . Right knee pain Yes  . Tibial plateau fracture, right, closed, with routine healing, subsequent encounter   . Essential hypertension   . Diabetes mellitus without complication ([place])   . Obesity     Return in three weeks.  Continue PT.  Call if any problem.  Electronically Signed Sanjuana Kava, MD 7/26/20172:37 PM

## 2015-11-03 NOTE — Patient Instructions (Signed)
Return in 3 weeks

## 2015-11-04 DIAGNOSIS — S82124S Nondisplaced fracture of lateral condyle of right tibia, sequela: Secondary | ICD-10-CM | POA: Diagnosis not present

## 2015-11-04 DIAGNOSIS — M62451 Contracture of muscle, right thigh: Secondary | ICD-10-CM | POA: Diagnosis not present

## 2015-11-04 DIAGNOSIS — M25561 Pain in right knee: Secondary | ICD-10-CM | POA: Diagnosis not present

## 2015-11-04 DIAGNOSIS — M25661 Stiffness of right knee, not elsewhere classified: Secondary | ICD-10-CM | POA: Diagnosis not present

## 2015-11-08 DIAGNOSIS — M25561 Pain in right knee: Secondary | ICD-10-CM | POA: Diagnosis not present

## 2015-11-08 DIAGNOSIS — M62451 Contracture of muscle, right thigh: Secondary | ICD-10-CM | POA: Diagnosis not present

## 2015-11-08 DIAGNOSIS — S82124S Nondisplaced fracture of lateral condyle of right tibia, sequela: Secondary | ICD-10-CM | POA: Diagnosis not present

## 2015-11-08 DIAGNOSIS — M25661 Stiffness of right knee, not elsewhere classified: Secondary | ICD-10-CM | POA: Diagnosis not present

## 2015-11-09 DIAGNOSIS — M62451 Contracture of muscle, right thigh: Secondary | ICD-10-CM | POA: Diagnosis not present

## 2015-11-09 DIAGNOSIS — M25661 Stiffness of right knee, not elsewhere classified: Secondary | ICD-10-CM | POA: Diagnosis not present

## 2015-11-09 DIAGNOSIS — M25561 Pain in right knee: Secondary | ICD-10-CM | POA: Diagnosis not present

## 2015-11-09 DIAGNOSIS — S82124S Nondisplaced fracture of lateral condyle of right tibia, sequela: Secondary | ICD-10-CM | POA: Diagnosis not present

## 2015-11-11 DIAGNOSIS — M25561 Pain in right knee: Secondary | ICD-10-CM | POA: Diagnosis not present

## 2015-11-11 DIAGNOSIS — M62451 Contracture of muscle, right thigh: Secondary | ICD-10-CM | POA: Diagnosis not present

## 2015-11-11 DIAGNOSIS — S82124S Nondisplaced fracture of lateral condyle of right tibia, sequela: Secondary | ICD-10-CM | POA: Diagnosis not present

## 2015-11-11 DIAGNOSIS — M25661 Stiffness of right knee, not elsewhere classified: Secondary | ICD-10-CM | POA: Diagnosis not present

## 2015-11-15 DIAGNOSIS — M25561 Pain in right knee: Secondary | ICD-10-CM | POA: Diagnosis not present

## 2015-11-15 DIAGNOSIS — S82124S Nondisplaced fracture of lateral condyle of right tibia, sequela: Secondary | ICD-10-CM | POA: Diagnosis not present

## 2015-11-15 DIAGNOSIS — M25661 Stiffness of right knee, not elsewhere classified: Secondary | ICD-10-CM | POA: Diagnosis not present

## 2015-11-15 DIAGNOSIS — M62451 Contracture of muscle, right thigh: Secondary | ICD-10-CM | POA: Diagnosis not present

## 2015-11-16 DIAGNOSIS — S82124S Nondisplaced fracture of lateral condyle of right tibia, sequela: Secondary | ICD-10-CM | POA: Diagnosis not present

## 2015-11-16 DIAGNOSIS — M62451 Contracture of muscle, right thigh: Secondary | ICD-10-CM | POA: Diagnosis not present

## 2015-11-16 DIAGNOSIS — M25661 Stiffness of right knee, not elsewhere classified: Secondary | ICD-10-CM | POA: Diagnosis not present

## 2015-11-16 DIAGNOSIS — M25561 Pain in right knee: Secondary | ICD-10-CM | POA: Diagnosis not present

## 2015-11-18 DIAGNOSIS — S82124S Nondisplaced fracture of lateral condyle of right tibia, sequela: Secondary | ICD-10-CM | POA: Diagnosis not present

## 2015-11-18 DIAGNOSIS — M62451 Contracture of muscle, right thigh: Secondary | ICD-10-CM | POA: Diagnosis not present

## 2015-11-18 DIAGNOSIS — M25661 Stiffness of right knee, not elsewhere classified: Secondary | ICD-10-CM | POA: Diagnosis not present

## 2015-11-18 DIAGNOSIS — M25561 Pain in right knee: Secondary | ICD-10-CM | POA: Diagnosis not present

## 2015-11-22 DIAGNOSIS — S82124S Nondisplaced fracture of lateral condyle of right tibia, sequela: Secondary | ICD-10-CM | POA: Diagnosis not present

## 2015-11-22 DIAGNOSIS — M62451 Contracture of muscle, right thigh: Secondary | ICD-10-CM | POA: Diagnosis not present

## 2015-11-22 DIAGNOSIS — M25561 Pain in right knee: Secondary | ICD-10-CM | POA: Diagnosis not present

## 2015-11-22 DIAGNOSIS — M25661 Stiffness of right knee, not elsewhere classified: Secondary | ICD-10-CM | POA: Diagnosis not present

## 2015-11-23 DIAGNOSIS — S82124S Nondisplaced fracture of lateral condyle of right tibia, sequela: Secondary | ICD-10-CM | POA: Diagnosis not present

## 2015-11-23 DIAGNOSIS — M25561 Pain in right knee: Secondary | ICD-10-CM | POA: Diagnosis not present

## 2015-11-23 DIAGNOSIS — M25661 Stiffness of right knee, not elsewhere classified: Secondary | ICD-10-CM | POA: Diagnosis not present

## 2015-11-23 DIAGNOSIS — M62451 Contracture of muscle, right thigh: Secondary | ICD-10-CM | POA: Diagnosis not present

## 2015-11-24 ENCOUNTER — Encounter: Payer: Self-pay | Admitting: Orthopaedic Surgery

## 2015-11-24 ENCOUNTER — Ambulatory Visit (INDEPENDENT_AMBULATORY_CARE_PROVIDER_SITE_OTHER): Payer: Medicare Other | Admitting: Orthopaedic Surgery

## 2015-11-24 VITALS — BP 123/95 | HR 81 | Temp 98.1°F | Ht 65.5 in | Wt 261.0 lb

## 2015-11-24 DIAGNOSIS — S82141D Displaced bicondylar fracture of right tibia, subsequent encounter for closed fracture with routine healing: Secondary | ICD-10-CM | POA: Diagnosis not present

## 2015-11-24 DIAGNOSIS — M25561 Pain in right knee: Secondary | ICD-10-CM

## 2015-11-24 NOTE — Progress Notes (Signed)
Patient BP:[id number] Natalie Friedman, female DOB:[birth year], 65 y.o. [id number]  Chief Complaint  Patient presents with  . Follow-up    right knee    HPI  Natalie Friedman is a 65 y.o. female who has pain in the right knee post tibial plateau fracture.  She has had lack of full extension of the knee and is now going to PT and doing exercises at home.  She is improving.  She can now walk with the heel touching the ground and lacks about 5 degrees from full extension.  She still limps and uses her crutches but is better and has less pain.  She needs to continue. HPI  Body mass index is 42.77 kg/m.  ROS  Review of Systems  HENT: Negative for congestion.   Respiratory: Negative for cough and shortness of breath.   Cardiovascular: Negative for chest pain and leg swelling.  Endocrine: Positive for cold intolerance.  Musculoskeletal: Positive for arthralgias, gait problem and joint swelling.  Allergic/Immunologic: Positive for environmental allergies.  Neurological: Negative for numbness.    Past Medical History:  Diagnosis Date  . Diabetes mellitus without complication ([place])   . Hypertension   . Occult spina bifida     Past Surgical History:  Procedure Laterality Date  . ectopic      No family history on file.  Social History Social History  Substance Use Topics  . Smoking status: Never Smoker  . Smokeless tobacco: Never Used  . Alcohol use No    No Known Allergies  Current Outpatient Prescriptions  Medication Sig Dispense Refill  . amLODipine (NORVASC) 10 MG tablet   0  . insulin detemir (LEVEMIR) 100 UNIT/ML injection Inject 20 Units into the skin at bedtime.    . Omega 3 1000 MG CAPS Take 1,000 mg by mouth.    . traMADol (ULTRAM) 50 MG tablet Take 50 mg by mouth every 6 (six) hours as needed. Reported on 08/19/2015     No current facility-administered medications for this visit.      Physical Exam  Blood pressure (!) 123/95, pulse 81, temperature 98.1 F (36.7 C),  height 5' 5.5" (1.664 m), weight 261 lb (118.4 kg).  Constitutional: overall normal hygiene, normal nutrition, well developed, normal grooming, normal body habitus. Assistive device:crutches  Musculoskeletal: gait and station Limp right, muscle tone and strength are normal, no tremors or atrophy is present.  .  Neurological: coordination overall normal.  Deep tendon reflex/nerve stretch intact.  Sensation normal.  Cranial nerves II-XII intact.   Skin:   normal overall no scars, lesions, ulcers or rashes. No psoriasis.  Psychiatric: Alert and oriented x 3.  Recent memory intact, remote memory unclear.  Normal mood and affect. Well groomed.  Good eye contact.  Cardiovascular: overall no swelling, no varicosities, no edema bilaterally, normal temperatures of the legs and arms, no clubbing, cyanosis and good capillary refill.  Lymphatic: palpation is normal.  The right knee lacks about 5 degrees of full extension.  She is less tight around the hamstrings and is able to fully put her foot on the ground when walking now.  Previously she had heel rise of about a half inch.  NV is intact.  She has some effusion of the right knee but less.  Pulses normal.    The patient has been educated about the nature of the problem(s) and counseled on treatment options.  The patient appeared to understand what I have discussed and is in agreement with it.  Encounter Diagnoses  Name Primary?  Marland Kitchen  Tibial plateau fracture, right, closed, with routine healing, subsequent encounter Yes  . Right knee pain     PLAN Call if any problems.  Precautions discussed.  Continue current medications.   Return to clinic 3 weeks   Continue PT.  Electronically Signed Sanjuana Kava, MD 8/16/20172:17 PM

## 2015-11-24 NOTE — Progress Notes (Signed)
f °

## 2015-11-24 NOTE — Patient Instructions (Signed)
Continue therapy.  Precautions discussed.

## 2015-11-25 DIAGNOSIS — M25661 Stiffness of right knee, not elsewhere classified: Secondary | ICD-10-CM | POA: Diagnosis not present

## 2015-11-25 DIAGNOSIS — M25561 Pain in right knee: Secondary | ICD-10-CM | POA: Diagnosis not present

## 2015-11-25 DIAGNOSIS — S82124S Nondisplaced fracture of lateral condyle of right tibia, sequela: Secondary | ICD-10-CM | POA: Diagnosis not present

## 2015-11-25 DIAGNOSIS — M62451 Contracture of muscle, right thigh: Secondary | ICD-10-CM | POA: Diagnosis not present

## 2015-11-30 DIAGNOSIS — S82124S Nondisplaced fracture of lateral condyle of right tibia, sequela: Secondary | ICD-10-CM | POA: Diagnosis not present

## 2015-11-30 DIAGNOSIS — M62451 Contracture of muscle, right thigh: Secondary | ICD-10-CM | POA: Diagnosis not present

## 2015-11-30 DIAGNOSIS — M25561 Pain in right knee: Secondary | ICD-10-CM | POA: Diagnosis not present

## 2015-11-30 DIAGNOSIS — M25661 Stiffness of right knee, not elsewhere classified: Secondary | ICD-10-CM | POA: Diagnosis not present

## 2015-12-02 DIAGNOSIS — M25561 Pain in right knee: Secondary | ICD-10-CM | POA: Diagnosis not present

## 2015-12-02 DIAGNOSIS — M25661 Stiffness of right knee, not elsewhere classified: Secondary | ICD-10-CM | POA: Diagnosis not present

## 2015-12-02 DIAGNOSIS — S82124S Nondisplaced fracture of lateral condyle of right tibia, sequela: Secondary | ICD-10-CM | POA: Diagnosis not present

## 2015-12-02 DIAGNOSIS — M62451 Contracture of muscle, right thigh: Secondary | ICD-10-CM | POA: Diagnosis not present

## 2015-12-06 DIAGNOSIS — M62451 Contracture of muscle, right thigh: Secondary | ICD-10-CM | POA: Diagnosis not present

## 2015-12-06 DIAGNOSIS — M25661 Stiffness of right knee, not elsewhere classified: Secondary | ICD-10-CM | POA: Diagnosis not present

## 2015-12-06 DIAGNOSIS — M25561 Pain in right knee: Secondary | ICD-10-CM | POA: Diagnosis not present

## 2015-12-06 DIAGNOSIS — S82124S Nondisplaced fracture of lateral condyle of right tibia, sequela: Secondary | ICD-10-CM | POA: Diagnosis not present

## 2015-12-07 DIAGNOSIS — M62451 Contracture of muscle, right thigh: Secondary | ICD-10-CM | POA: Diagnosis not present

## 2015-12-07 DIAGNOSIS — M25561 Pain in right knee: Secondary | ICD-10-CM | POA: Diagnosis not present

## 2015-12-07 DIAGNOSIS — M25661 Stiffness of right knee, not elsewhere classified: Secondary | ICD-10-CM | POA: Diagnosis not present

## 2015-12-07 DIAGNOSIS — S82124S Nondisplaced fracture of lateral condyle of right tibia, sequela: Secondary | ICD-10-CM | POA: Diagnosis not present

## 2015-12-09 DIAGNOSIS — M25561 Pain in right knee: Secondary | ICD-10-CM | POA: Diagnosis not present

## 2015-12-09 DIAGNOSIS — M62451 Contracture of muscle, right thigh: Secondary | ICD-10-CM | POA: Diagnosis not present

## 2015-12-09 DIAGNOSIS — M25661 Stiffness of right knee, not elsewhere classified: Secondary | ICD-10-CM | POA: Diagnosis not present

## 2015-12-09 DIAGNOSIS — S82124S Nondisplaced fracture of lateral condyle of right tibia, sequela: Secondary | ICD-10-CM | POA: Diagnosis not present

## 2015-12-14 DIAGNOSIS — M62451 Contracture of muscle, right thigh: Secondary | ICD-10-CM | POA: Diagnosis not present

## 2015-12-14 DIAGNOSIS — M25561 Pain in right knee: Secondary | ICD-10-CM | POA: Diagnosis not present

## 2015-12-14 DIAGNOSIS — M25661 Stiffness of right knee, not elsewhere classified: Secondary | ICD-10-CM | POA: Diagnosis not present

## 2015-12-14 DIAGNOSIS — S82124S Nondisplaced fracture of lateral condyle of right tibia, sequela: Secondary | ICD-10-CM | POA: Diagnosis not present

## 2015-12-15 ENCOUNTER — Ambulatory Visit: Payer: Medicare Other | Admitting: Orthopaedic Surgery

## 2015-12-15 DIAGNOSIS — M25661 Stiffness of right knee, not elsewhere classified: Secondary | ICD-10-CM | POA: Diagnosis not present

## 2015-12-15 DIAGNOSIS — M62451 Contracture of muscle, right thigh: Secondary | ICD-10-CM | POA: Diagnosis not present

## 2015-12-15 DIAGNOSIS — M25561 Pain in right knee: Secondary | ICD-10-CM | POA: Diagnosis not present

## 2015-12-15 DIAGNOSIS — S82124S Nondisplaced fracture of lateral condyle of right tibia, sequela: Secondary | ICD-10-CM | POA: Diagnosis not present

## 2015-12-16 DIAGNOSIS — M62451 Contracture of muscle, right thigh: Secondary | ICD-10-CM | POA: Diagnosis not present

## 2015-12-16 DIAGNOSIS — M25661 Stiffness of right knee, not elsewhere classified: Secondary | ICD-10-CM | POA: Diagnosis not present

## 2015-12-16 DIAGNOSIS — S82124S Nondisplaced fracture of lateral condyle of right tibia, sequela: Secondary | ICD-10-CM | POA: Diagnosis not present

## 2015-12-16 DIAGNOSIS — M25561 Pain in right knee: Secondary | ICD-10-CM | POA: Diagnosis not present

## 2015-12-20 DIAGNOSIS — M62451 Contracture of muscle, right thigh: Secondary | ICD-10-CM | POA: Diagnosis not present

## 2015-12-20 DIAGNOSIS — M25661 Stiffness of right knee, not elsewhere classified: Secondary | ICD-10-CM | POA: Diagnosis not present

## 2015-12-20 DIAGNOSIS — S82124S Nondisplaced fracture of lateral condyle of right tibia, sequela: Secondary | ICD-10-CM | POA: Diagnosis not present

## 2015-12-20 DIAGNOSIS — M25561 Pain in right knee: Secondary | ICD-10-CM | POA: Diagnosis not present

## 2015-12-22 ENCOUNTER — Ambulatory Visit (INDEPENDENT_AMBULATORY_CARE_PROVIDER_SITE_OTHER): Payer: Medicare Other | Admitting: Orthopaedic Surgery

## 2015-12-22 VITALS — BP 192/97 | HR 77 | Ht 65.5 in

## 2015-12-22 DIAGNOSIS — M25561 Pain in right knee: Secondary | ICD-10-CM | POA: Diagnosis not present

## 2015-12-22 DIAGNOSIS — I10 Essential (primary) hypertension: Secondary | ICD-10-CM | POA: Diagnosis not present

## 2015-12-22 DIAGNOSIS — E119 Type 2 diabetes mellitus without complications: Secondary | ICD-10-CM

## 2015-12-22 DIAGNOSIS — M62451 Contracture of muscle, right thigh: Secondary | ICD-10-CM | POA: Diagnosis not present

## 2015-12-22 DIAGNOSIS — M25661 Stiffness of right knee, not elsewhere classified: Secondary | ICD-10-CM | POA: Diagnosis not present

## 2015-12-22 DIAGNOSIS — E669 Obesity, unspecified: Secondary | ICD-10-CM

## 2015-12-22 DIAGNOSIS — S82124S Nondisplaced fracture of lateral condyle of right tibia, sequela: Secondary | ICD-10-CM | POA: Diagnosis not present

## 2015-12-22 NOTE — Progress Notes (Signed)
Patient Natalie Friedman, female DOB:04/13/1950, 65 y.o. IOE:[id number]  Chief Complaint  Patient presents with  . Follow-up    RIGHT KNEE     HPI  Natalie Friedman is a 65 y.o. female who continues to have right knee pain.  She is post fracture of the tibial plateau on the right and post global period.  She still lacks about 5 to 10 degrees of full extension of the knee.  She is going to PT and is making slow but some progress.  She will need more PT.  She is on crutches.  She will need a total knee after she gets full extension.  She has no new trauma.  HPI  There is no height or weight on file to calculate BMI.  ROS  Review of Systems  HENT: Negative for congestion.   Respiratory: Negative for cough and shortness of breath.   Cardiovascular: Negative for chest pain and leg swelling.  Endocrine: Positive for cold intolerance.  Musculoskeletal: Positive for arthralgias, gait problem and joint swelling.  Allergic/Immunologic: Positive for environmental allergies.  Neurological: Negative for numbness.    Past Medical History:  Diagnosis Date  . Diabetes mellitus without complication ([place])   . Hypertension   . Occult spina bifida     Past Surgical History:  Procedure Laterality Date  . ectopic      No family history on file.  Social History Social History  Substance Use Topics  . Smoking status: Never Smoker  . Smokeless tobacco: Never Used  . Alcohol use No    No Known Allergies  Current Outpatient Prescriptions  Medication Sig Dispense Refill  . amLODipine (NORVASC) 10 MG tablet   0  . insulin detemir (LEVEMIR) 100 UNIT/ML injection Inject 20 Units into the skin at bedtime.    . Omega 3 1000 MG CAPS Take 1,000 mg by mouth.     No current facility-administered medications for this visit.      Physical Exam  Blood pressure (!) 192/97, pulse 77, height 5' 5.5" (1.664 m).  Constitutional: overall normal hygiene, normal nutrition, well developed, normal  grooming, normal body habitus. Assistive device:crutches  Musculoskeletal: gait and station Limp right, muscle tone and strength are normal, no tremors or atrophy is present.  .  Neurological: coordination overall normal.  Deep tendon reflex/nerve stretch intact.  Sensation normal.  Cranial nerves II-XII intact.   Skin:   Normal overall no scars, lesions, ulcers or rashes. No psoriasis.  Psychiatric: Alert and oriented x 3.  Recent memory intact, remote memory unclear.  Normal mood and affect. Well groomed.  Good eye contact.  Cardiovascular: overall no swelling, no varicosities, no edema bilaterally, normal temperatures of the legs and arms, no clubbing, cyanosis and good capillary refill.  Lymphatic: palpation is normal.  The right lower extremity is examined:  Inspection:  Thigh:  Non-tender and no defects  Knee has swelling 1+ effusion.                        Joint tenderness is present                        Patient is tender over the medial joint line  Lower Leg:  Has normal appearance and no tenderness or defects  Ankle:  Non-tender and no defects  Foot:  Non-tender and no defects Range of Motion:  Knee:  Range of motion is: 8 to 95  Crepitus is  present  Ankle:  Range of motion is normal. Strength and Tone:  The right lower extremity has normal strength and tone. Stability:  Knee:  The knee is stable.  Ankle:  The ankle is stable.    The patient has been educated about the nature of the problem(s) and counseled on treatment options.  The patient appeared to understand what I have discussed and is in agreement with it.  Encounter Diagnoses  Name Primary?  . Right knee pain Yes  . Essential hypertension   . Diabetes mellitus without complication ([place])   . Obesity     PLAN Call if any problems.  Precautions discussed.  Continue current medications.   Return to clinic 1 month   Continue PT.  Electronically Signed Sanjuana Kava,  MD 9/13/201710:34 AM

## 2015-12-23 DIAGNOSIS — M62451 Contracture of muscle, right thigh: Secondary | ICD-10-CM | POA: Diagnosis not present

## 2015-12-23 DIAGNOSIS — M25661 Stiffness of right knee, not elsewhere classified: Secondary | ICD-10-CM | POA: Diagnosis not present

## 2015-12-23 DIAGNOSIS — M25561 Pain in right knee: Secondary | ICD-10-CM | POA: Diagnosis not present

## 2015-12-23 DIAGNOSIS — S82124S Nondisplaced fracture of lateral condyle of right tibia, sequela: Secondary | ICD-10-CM | POA: Diagnosis not present

## 2015-12-27 DIAGNOSIS — M25561 Pain in right knee: Secondary | ICD-10-CM | POA: Diagnosis not present

## 2015-12-27 DIAGNOSIS — M62451 Contracture of muscle, right thigh: Secondary | ICD-10-CM | POA: Diagnosis not present

## 2015-12-27 DIAGNOSIS — S82124S Nondisplaced fracture of lateral condyle of right tibia, sequela: Secondary | ICD-10-CM | POA: Diagnosis not present

## 2015-12-27 DIAGNOSIS — M25661 Stiffness of right knee, not elsewhere classified: Secondary | ICD-10-CM | POA: Diagnosis not present

## 2015-12-28 DIAGNOSIS — M25561 Pain in right knee: Secondary | ICD-10-CM | POA: Diagnosis not present

## 2015-12-28 DIAGNOSIS — M62451 Contracture of muscle, right thigh: Secondary | ICD-10-CM | POA: Diagnosis not present

## 2015-12-28 DIAGNOSIS — M25661 Stiffness of right knee, not elsewhere classified: Secondary | ICD-10-CM | POA: Diagnosis not present

## 2015-12-28 DIAGNOSIS — S82124S Nondisplaced fracture of lateral condyle of right tibia, sequela: Secondary | ICD-10-CM | POA: Diagnosis not present

## 2015-12-30 DIAGNOSIS — M25661 Stiffness of right knee, not elsewhere classified: Secondary | ICD-10-CM | POA: Diagnosis not present

## 2015-12-30 DIAGNOSIS — M62451 Contracture of muscle, right thigh: Secondary | ICD-10-CM | POA: Diagnosis not present

## 2015-12-30 DIAGNOSIS — M25561 Pain in right knee: Secondary | ICD-10-CM | POA: Diagnosis not present

## 2015-12-30 DIAGNOSIS — S82124S Nondisplaced fracture of lateral condyle of right tibia, sequela: Secondary | ICD-10-CM | POA: Diagnosis not present

## 2016-01-03 DIAGNOSIS — M25661 Stiffness of right knee, not elsewhere classified: Secondary | ICD-10-CM | POA: Diagnosis not present

## 2016-01-03 DIAGNOSIS — S82124S Nondisplaced fracture of lateral condyle of right tibia, sequela: Secondary | ICD-10-CM | POA: Diagnosis not present

## 2016-01-03 DIAGNOSIS — M62451 Contracture of muscle, right thigh: Secondary | ICD-10-CM | POA: Diagnosis not present

## 2016-01-03 DIAGNOSIS — M25561 Pain in right knee: Secondary | ICD-10-CM | POA: Diagnosis not present

## 2016-01-05 DIAGNOSIS — M25661 Stiffness of right knee, not elsewhere classified: Secondary | ICD-10-CM | POA: Diagnosis not present

## 2016-01-05 DIAGNOSIS — M25561 Pain in right knee: Secondary | ICD-10-CM | POA: Diagnosis not present

## 2016-01-05 DIAGNOSIS — M62451 Contracture of muscle, right thigh: Secondary | ICD-10-CM | POA: Diagnosis not present

## 2016-01-05 DIAGNOSIS — S82124S Nondisplaced fracture of lateral condyle of right tibia, sequela: Secondary | ICD-10-CM | POA: Diagnosis not present

## 2016-01-07 DIAGNOSIS — S82124S Nondisplaced fracture of lateral condyle of right tibia, sequela: Secondary | ICD-10-CM | POA: Diagnosis not present

## 2016-01-07 DIAGNOSIS — M62451 Contracture of muscle, right thigh: Secondary | ICD-10-CM | POA: Diagnosis not present

## 2016-01-07 DIAGNOSIS — M25661 Stiffness of right knee, not elsewhere classified: Secondary | ICD-10-CM | POA: Diagnosis not present

## 2016-01-07 DIAGNOSIS — M25561 Pain in right knee: Secondary | ICD-10-CM | POA: Diagnosis not present

## 2016-01-11 DIAGNOSIS — M62451 Contracture of muscle, right thigh: Secondary | ICD-10-CM | POA: Diagnosis not present

## 2016-01-11 DIAGNOSIS — M25661 Stiffness of right knee, not elsewhere classified: Secondary | ICD-10-CM | POA: Diagnosis not present

## 2016-01-11 DIAGNOSIS — S82124S Nondisplaced fracture of lateral condyle of right tibia, sequela: Secondary | ICD-10-CM | POA: Diagnosis not present

## 2016-01-11 DIAGNOSIS — M25561 Pain in right knee: Secondary | ICD-10-CM | POA: Diagnosis not present

## 2016-01-13 DIAGNOSIS — S82124S Nondisplaced fracture of lateral condyle of right tibia, sequela: Secondary | ICD-10-CM | POA: Diagnosis not present

## 2016-01-13 DIAGNOSIS — M25661 Stiffness of right knee, not elsewhere classified: Secondary | ICD-10-CM | POA: Diagnosis not present

## 2016-01-13 DIAGNOSIS — M62451 Contracture of muscle, right thigh: Secondary | ICD-10-CM | POA: Diagnosis not present

## 2016-01-13 DIAGNOSIS — M25561 Pain in right knee: Secondary | ICD-10-CM | POA: Diagnosis not present

## 2016-01-14 DIAGNOSIS — M25661 Stiffness of right knee, not elsewhere classified: Secondary | ICD-10-CM | POA: Diagnosis not present

## 2016-01-14 DIAGNOSIS — M25561 Pain in right knee: Secondary | ICD-10-CM | POA: Diagnosis not present

## 2016-01-14 DIAGNOSIS — S82124S Nondisplaced fracture of lateral condyle of right tibia, sequela: Secondary | ICD-10-CM | POA: Diagnosis not present

## 2016-01-14 DIAGNOSIS — M62451 Contracture of muscle, right thigh: Secondary | ICD-10-CM | POA: Diagnosis not present

## 2016-01-17 DIAGNOSIS — M62451 Contracture of muscle, right thigh: Secondary | ICD-10-CM | POA: Diagnosis not present

## 2016-01-17 DIAGNOSIS — M25661 Stiffness of right knee, not elsewhere classified: Secondary | ICD-10-CM | POA: Diagnosis not present

## 2016-01-17 DIAGNOSIS — M25561 Pain in right knee: Secondary | ICD-10-CM | POA: Diagnosis not present

## 2016-01-17 DIAGNOSIS — S82124S Nondisplaced fracture of lateral condyle of right tibia, sequela: Secondary | ICD-10-CM | POA: Diagnosis not present

## 2016-01-19 ENCOUNTER — Ambulatory Visit (INDEPENDENT_AMBULATORY_CARE_PROVIDER_SITE_OTHER): Payer: Medicare Other | Admitting: Orthopaedic Surgery

## 2016-01-19 ENCOUNTER — Encounter: Payer: Self-pay | Admitting: Orthopaedic Surgery

## 2016-01-19 VITALS — BP 196/89 | HR 88 | Temp 98.1°F | Ht 65.5 in | Wt 271.0 lb

## 2016-01-19 DIAGNOSIS — M25661 Stiffness of right knee, not elsewhere classified: Secondary | ICD-10-CM | POA: Diagnosis not present

## 2016-01-19 DIAGNOSIS — G8929 Other chronic pain: Secondary | ICD-10-CM

## 2016-01-19 DIAGNOSIS — M25561 Pain in right knee: Secondary | ICD-10-CM | POA: Diagnosis not present

## 2016-01-19 DIAGNOSIS — M62451 Contracture of muscle, right thigh: Secondary | ICD-10-CM | POA: Diagnosis not present

## 2016-01-19 DIAGNOSIS — S82124S Nondisplaced fracture of lateral condyle of right tibia, sequela: Secondary | ICD-10-CM | POA: Diagnosis not present

## 2016-01-19 DIAGNOSIS — I10 Essential (primary) hypertension: Secondary | ICD-10-CM | POA: Diagnosis not present

## 2016-01-19 NOTE — Progress Notes (Signed)
Patient Natalie Friedman, female DOB:09-11-1950, 65 y.o. ZHY:[id number]  Chief Complaint  Patient presents with  . Follow-up    Right knee    HPI  Natalie Friedman is a 65 y.o. female who has chronic pain of the right knee post tibial plateau fracture, marked DJD and inability to fully extend her knee on the right. She has been doing to PT and has made good progress with getting better strength of the right thigh.  She is reaching a point that the knee cannot be further extended secondary to the degenerative changes.I have talked to the therapist from PT today, in fact, he came to the office to discuss her progress.  I want her to continue PT.  She is well motivated. She needs a total knee and once her overall strength improves and if we can get full extension of the knee or very close to it, then surgery can be done.  She understands this. HPI  Body mass index is 44.41 kg/m.  ROS  Review of Systems  HENT: Negative for congestion.   Respiratory: Negative for cough and shortness of breath.   Cardiovascular: Negative for chest pain and leg swelling.  Endocrine: Positive for cold intolerance.  Musculoskeletal: Positive for arthralgias, gait problem and joint swelling.  Allergic/Immunologic: Positive for environmental allergies.  Neurological: Negative for numbness.    Past Medical History:  Diagnosis Date  . Diabetes mellitus without complication ([place])   . Hypertension   . Occult spina bifida     Past Surgical History:  Procedure Laterality Date  . ectopic      History reviewed. No pertinent family history.  Social History Social History  Substance Use Topics  . Smoking status: Never Smoker  . Smokeless tobacco: Never Used  . Alcohol use No    No Known Allergies  Current Outpatient Prescriptions  Medication Sig Dispense Refill  . amLODipine (NORVASC) 10 MG tablet   0  . insulin detemir (LEVEMIR) 100 UNIT/ML injection Inject 20 Units into the skin at bedtime.    . Omega  3 1000 MG CAPS Take 1,000 mg by mouth.     No current facility-administered medications for this visit.      Physical Exam  Blood pressure (!) 196/89, pulse 88, temperature 98.1 F (36.7 C), height 5' 5.5" (1.664 m), weight 271 lb (122.9 kg).  Constitutional: overall normal hygiene, normal nutrition, well developed, normal grooming, normal body habitus. Assistive device:crutches  Musculoskeletal: gait and station Limp right, muscle tone and strength are normal, no tremors or atrophy is present.  .  Neurological: coordination overall normal.  Deep tendon reflex/nerve stretch intact.  Sensation normal.  Cranial nerves II-XII intact.   Skin:   Normal overall no scars, lesions, ulcers or rashes. No psoriasis.  Psychiatric: Alert and oriented x 3.  Recent memory intact, remote memory unclear.  Normal mood and affect. Well groomed.  Good eye contact.  Cardiovascular: overall no swelling, no varicosities, no edema bilaterally, normal temperatures of the legs and arms, no clubbing, cyanosis and good capillary refill.  Lymphatic: palpation is normal.  Her right knee lacks full extension today by 5 to 7 degrees actively.  She has a limp on the right but she is much stronger in the quads and hamstrings now.  She has less of a limp.  She has some effusion of the right knee but less.  NV intact.  Flexion to 105.  The patient has been educated about the nature of the problem(s) and counseled on treatment  options.  The patient appeared to understand what I have discussed and is in agreement with it.  Encounter Diagnoses  Name Primary?  . Chronic pain of right knee Yes  . Essential hypertension     PLAN Call if any problems.  Precautions discussed.  Continue current medications.   Return to clinic 6 weeks   Continue PT.  Electronically Signed Sanjuana Kava, MD 10/11/20179:35 AM

## 2016-01-20 DIAGNOSIS — S82124S Nondisplaced fracture of lateral condyle of right tibia, sequela: Secondary | ICD-10-CM | POA: Diagnosis not present

## 2016-01-20 DIAGNOSIS — M25561 Pain in right knee: Secondary | ICD-10-CM | POA: Diagnosis not present

## 2016-01-20 DIAGNOSIS — M25661 Stiffness of right knee, not elsewhere classified: Secondary | ICD-10-CM | POA: Diagnosis not present

## 2016-01-20 DIAGNOSIS — M62451 Contracture of muscle, right thigh: Secondary | ICD-10-CM | POA: Diagnosis not present

## 2016-01-24 DIAGNOSIS — M25661 Stiffness of right knee, not elsewhere classified: Secondary | ICD-10-CM | POA: Diagnosis not present

## 2016-01-24 DIAGNOSIS — M25561 Pain in right knee: Secondary | ICD-10-CM | POA: Diagnosis not present

## 2016-01-24 DIAGNOSIS — S82124S Nondisplaced fracture of lateral condyle of right tibia, sequela: Secondary | ICD-10-CM | POA: Diagnosis not present

## 2016-01-24 DIAGNOSIS — M62451 Contracture of muscle, right thigh: Secondary | ICD-10-CM | POA: Diagnosis not present

## 2016-01-28 DIAGNOSIS — M25661 Stiffness of right knee, not elsewhere classified: Secondary | ICD-10-CM | POA: Diagnosis not present

## 2016-01-28 DIAGNOSIS — S82124S Nondisplaced fracture of lateral condyle of right tibia, sequela: Secondary | ICD-10-CM | POA: Diagnosis not present

## 2016-01-28 DIAGNOSIS — M62451 Contracture of muscle, right thigh: Secondary | ICD-10-CM | POA: Diagnosis not present

## 2016-01-28 DIAGNOSIS — M25561 Pain in right knee: Secondary | ICD-10-CM | POA: Diagnosis not present

## 2016-01-31 DIAGNOSIS — S82124S Nondisplaced fracture of lateral condyle of right tibia, sequela: Secondary | ICD-10-CM | POA: Diagnosis not present

## 2016-01-31 DIAGNOSIS — M62451 Contracture of muscle, right thigh: Secondary | ICD-10-CM | POA: Diagnosis not present

## 2016-01-31 DIAGNOSIS — M25661 Stiffness of right knee, not elsewhere classified: Secondary | ICD-10-CM | POA: Diagnosis not present

## 2016-01-31 DIAGNOSIS — M25561 Pain in right knee: Secondary | ICD-10-CM | POA: Diagnosis not present

## 2016-02-02 DIAGNOSIS — M25561 Pain in right knee: Secondary | ICD-10-CM | POA: Diagnosis not present

## 2016-02-02 DIAGNOSIS — M62451 Contracture of muscle, right thigh: Secondary | ICD-10-CM | POA: Diagnosis not present

## 2016-02-02 DIAGNOSIS — S82124S Nondisplaced fracture of lateral condyle of right tibia, sequela: Secondary | ICD-10-CM | POA: Diagnosis not present

## 2016-02-02 DIAGNOSIS — M25661 Stiffness of right knee, not elsewhere classified: Secondary | ICD-10-CM | POA: Diagnosis not present

## 2016-02-04 DIAGNOSIS — M25661 Stiffness of right knee, not elsewhere classified: Secondary | ICD-10-CM | POA: Diagnosis not present

## 2016-02-04 DIAGNOSIS — S82124S Nondisplaced fracture of lateral condyle of right tibia, sequela: Secondary | ICD-10-CM | POA: Diagnosis not present

## 2016-02-04 DIAGNOSIS — M25561 Pain in right knee: Secondary | ICD-10-CM | POA: Diagnosis not present

## 2016-02-04 DIAGNOSIS — M62451 Contracture of muscle, right thigh: Secondary | ICD-10-CM | POA: Diagnosis not present

## 2016-02-07 DIAGNOSIS — M62451 Contracture of muscle, right thigh: Secondary | ICD-10-CM | POA: Diagnosis not present

## 2016-02-07 DIAGNOSIS — M25561 Pain in right knee: Secondary | ICD-10-CM | POA: Diagnosis not present

## 2016-02-07 DIAGNOSIS — S82124S Nondisplaced fracture of lateral condyle of right tibia, sequela: Secondary | ICD-10-CM | POA: Diagnosis not present

## 2016-02-07 DIAGNOSIS — M25661 Stiffness of right knee, not elsewhere classified: Secondary | ICD-10-CM | POA: Diagnosis not present

## 2016-02-09 DIAGNOSIS — S82124S Nondisplaced fracture of lateral condyle of right tibia, sequela: Secondary | ICD-10-CM | POA: Diagnosis not present

## 2016-02-09 DIAGNOSIS — M25661 Stiffness of right knee, not elsewhere classified: Secondary | ICD-10-CM | POA: Diagnosis not present

## 2016-02-09 DIAGNOSIS — M25561 Pain in right knee: Secondary | ICD-10-CM | POA: Diagnosis not present

## 2016-02-09 DIAGNOSIS — M62451 Contracture of muscle, right thigh: Secondary | ICD-10-CM | POA: Diagnosis not present

## 2016-02-16 DIAGNOSIS — M25661 Stiffness of right knee, not elsewhere classified: Secondary | ICD-10-CM | POA: Diagnosis not present

## 2016-02-16 DIAGNOSIS — M25561 Pain in right knee: Secondary | ICD-10-CM | POA: Diagnosis not present

## 2016-02-16 DIAGNOSIS — S82124S Nondisplaced fracture of lateral condyle of right tibia, sequela: Secondary | ICD-10-CM | POA: Diagnosis not present

## 2016-02-16 DIAGNOSIS — M62451 Contracture of muscle, right thigh: Secondary | ICD-10-CM | POA: Diagnosis not present

## 2016-02-18 DIAGNOSIS — S82124S Nondisplaced fracture of lateral condyle of right tibia, sequela: Secondary | ICD-10-CM | POA: Diagnosis not present

## 2016-02-18 DIAGNOSIS — M25561 Pain in right knee: Secondary | ICD-10-CM | POA: Diagnosis not present

## 2016-02-18 DIAGNOSIS — M25661 Stiffness of right knee, not elsewhere classified: Secondary | ICD-10-CM | POA: Diagnosis not present

## 2016-02-18 DIAGNOSIS — M62451 Contracture of muscle, right thigh: Secondary | ICD-10-CM | POA: Diagnosis not present

## 2016-02-21 DIAGNOSIS — M25561 Pain in right knee: Secondary | ICD-10-CM | POA: Diagnosis not present

## 2016-02-21 DIAGNOSIS — S82124S Nondisplaced fracture of lateral condyle of right tibia, sequela: Secondary | ICD-10-CM | POA: Diagnosis not present

## 2016-02-21 DIAGNOSIS — M62451 Contracture of muscle, right thigh: Secondary | ICD-10-CM | POA: Diagnosis not present

## 2016-02-21 DIAGNOSIS — M25661 Stiffness of right knee, not elsewhere classified: Secondary | ICD-10-CM | POA: Diagnosis not present

## 2016-02-23 DIAGNOSIS — M62451 Contracture of muscle, right thigh: Secondary | ICD-10-CM | POA: Diagnosis not present

## 2016-02-23 DIAGNOSIS — M25561 Pain in right knee: Secondary | ICD-10-CM | POA: Diagnosis not present

## 2016-02-23 DIAGNOSIS — S82124S Nondisplaced fracture of lateral condyle of right tibia, sequela: Secondary | ICD-10-CM | POA: Diagnosis not present

## 2016-02-23 DIAGNOSIS — M25661 Stiffness of right knee, not elsewhere classified: Secondary | ICD-10-CM | POA: Diagnosis not present

## 2016-02-25 DIAGNOSIS — M25561 Pain in right knee: Secondary | ICD-10-CM | POA: Diagnosis not present

## 2016-02-25 DIAGNOSIS — M62451 Contracture of muscle, right thigh: Secondary | ICD-10-CM | POA: Diagnosis not present

## 2016-02-25 DIAGNOSIS — M25661 Stiffness of right knee, not elsewhere classified: Secondary | ICD-10-CM | POA: Diagnosis not present

## 2016-02-25 DIAGNOSIS — S82124S Nondisplaced fracture of lateral condyle of right tibia, sequela: Secondary | ICD-10-CM | POA: Diagnosis not present

## 2016-02-27 DIAGNOSIS — M25661 Stiffness of right knee, not elsewhere classified: Secondary | ICD-10-CM | POA: Diagnosis not present

## 2016-02-27 DIAGNOSIS — M25561 Pain in right knee: Secondary | ICD-10-CM | POA: Diagnosis not present

## 2016-02-27 DIAGNOSIS — M62451 Contracture of muscle, right thigh: Secondary | ICD-10-CM | POA: Diagnosis not present

## 2016-02-27 DIAGNOSIS — S82124S Nondisplaced fracture of lateral condyle of right tibia, sequela: Secondary | ICD-10-CM | POA: Diagnosis not present

## 2016-02-29 DIAGNOSIS — S82124S Nondisplaced fracture of lateral condyle of right tibia, sequela: Secondary | ICD-10-CM | POA: Diagnosis not present

## 2016-02-29 DIAGNOSIS — M25661 Stiffness of right knee, not elsewhere classified: Secondary | ICD-10-CM | POA: Diagnosis not present

## 2016-02-29 DIAGNOSIS — M62451 Contracture of muscle, right thigh: Secondary | ICD-10-CM | POA: Diagnosis not present

## 2016-02-29 DIAGNOSIS — M25561 Pain in right knee: Secondary | ICD-10-CM | POA: Diagnosis not present

## 2016-03-01 ENCOUNTER — Ambulatory Visit (INDEPENDENT_AMBULATORY_CARE_PROVIDER_SITE_OTHER): Payer: Medicare Other | Admitting: Orthopaedic Surgery

## 2016-03-01 ENCOUNTER — Encounter: Payer: Self-pay | Admitting: Orthopaedic Surgery

## 2016-03-01 ENCOUNTER — Encounter: Payer: Self-pay | Admitting: Orthopedic Surgery

## 2016-03-01 VITALS — BP 174/86 | HR 80 | Temp 98.1°F | Ht 66.0 in | Wt 278.0 lb

## 2016-03-01 DIAGNOSIS — M25561 Pain in right knee: Secondary | ICD-10-CM | POA: Diagnosis not present

## 2016-03-01 DIAGNOSIS — G8929 Other chronic pain: Secondary | ICD-10-CM | POA: Diagnosis not present

## 2016-03-01 DIAGNOSIS — I10 Essential (primary) hypertension: Secondary | ICD-10-CM

## 2016-03-01 DIAGNOSIS — E119 Type 2 diabetes mellitus without complications: Secondary | ICD-10-CM

## 2016-03-01 NOTE — Progress Notes (Signed)
Patient referred to me by Dr. Luna Glasgow for possible total knee replacement  I reviewed his note is incorporated by reference she basically had some knee pain then sustained a tibial plateau fracture lost motion went to see physical therapy motion did not return especially extension.  The tibial plateau fracture was approximate 6 months ago. She was treated appropriately with nonoperative measures including cortisone injection and herbal medication treatments per her preference. 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 she has intermittent days where she can't walk or stand  She has risk factors of diabetes hypertension and a BMI of 44 she's currently using a cane since her injury  She has other joints with bother her including spina bifida occulta with probable herniated disc sometime ago which she is managed with activity modification and chiropractic manipulation  She complains that her right leg rolls out which she can correct with concentration  I discussed the risks of surgery included but not limited to bleeding infection pulmonary embolus blood clot preoperative knee stiffness leading to postoperative knee stiffness  She would like to think about things first and see [place] in December for rediscussion regarding right total knee replacement

## 2016-03-01 NOTE — Progress Notes (Signed)
CC:  I have pain of my right knee. I would like an injection.  The patient has chronic pain of the right knee.  There is no recent trauma.  There is no redness.  Injections in the past have helped.  The knee has no redness, has an effusion and crepitus present.  ROM of the right knee is 10 to 90 with pain.  She has been to PT and they are not getting any better extension even after multiple visits.  Impression:  Chronic knee pain right  Return: to see Dr. Aline Brochure for evaluation for possible total knee.  PROCEDURE NOTE:  The patient requests injections of the right knee , verbal consent was obtained.  The right knee was prepped appropriately after time out was performed.   Sterile technique was observed and injection of 1 cc of Depo-Medrol 40 mg with several cc's of plain xylocaine. Anesthesia was provided by ethyl chloride and a 20-gauge needle was used to inject the knee area. The injection was tolerated well.  A band aid dressing was applied.  The patient was advised to apply ice later today and tomorrow to the injection sight as needed.  Electronically Signed Sanjuana Kava, MD 11/22/201710:07 AM

## 2016-03-06 DIAGNOSIS — S82124S Nondisplaced fracture of lateral condyle of right tibia, sequela: Secondary | ICD-10-CM | POA: Diagnosis not present

## 2016-03-06 DIAGNOSIS — M25561 Pain in right knee: Secondary | ICD-10-CM | POA: Diagnosis not present

## 2016-03-06 DIAGNOSIS — M62451 Contracture of muscle, right thigh: Secondary | ICD-10-CM | POA: Diagnosis not present

## 2016-03-06 DIAGNOSIS — M25661 Stiffness of right knee, not elsewhere classified: Secondary | ICD-10-CM | POA: Diagnosis not present

## 2016-03-08 DIAGNOSIS — M25661 Stiffness of right knee, not elsewhere classified: Secondary | ICD-10-CM | POA: Diagnosis not present

## 2016-03-08 DIAGNOSIS — M62451 Contracture of muscle, right thigh: Secondary | ICD-10-CM | POA: Diagnosis not present

## 2016-03-08 DIAGNOSIS — M25561 Pain in right knee: Secondary | ICD-10-CM | POA: Diagnosis not present

## 2016-03-08 DIAGNOSIS — S82124S Nondisplaced fracture of lateral condyle of right tibia, sequela: Secondary | ICD-10-CM | POA: Diagnosis not present

## 2016-03-13 DIAGNOSIS — M62451 Contracture of muscle, right thigh: Secondary | ICD-10-CM | POA: Diagnosis not present

## 2016-03-13 DIAGNOSIS — S82124S Nondisplaced fracture of lateral condyle of right tibia, sequela: Secondary | ICD-10-CM | POA: Diagnosis not present

## 2016-03-13 DIAGNOSIS — M25661 Stiffness of right knee, not elsewhere classified: Secondary | ICD-10-CM | POA: Diagnosis not present

## 2016-03-13 DIAGNOSIS — M25561 Pain in right knee: Secondary | ICD-10-CM | POA: Diagnosis not present

## 2016-03-15 DIAGNOSIS — S82124S Nondisplaced fracture of lateral condyle of right tibia, sequela: Secondary | ICD-10-CM | POA: Diagnosis not present

## 2016-03-15 DIAGNOSIS — M25561 Pain in right knee: Secondary | ICD-10-CM | POA: Diagnosis not present

## 2016-03-15 DIAGNOSIS — M25661 Stiffness of right knee, not elsewhere classified: Secondary | ICD-10-CM | POA: Diagnosis not present

## 2016-03-15 DIAGNOSIS — M62451 Contracture of muscle, right thigh: Secondary | ICD-10-CM | POA: Diagnosis not present

## 2016-03-16 DIAGNOSIS — M25661 Stiffness of right knee, not elsewhere classified: Secondary | ICD-10-CM | POA: Diagnosis not present

## 2016-03-16 DIAGNOSIS — M25561 Pain in right knee: Secondary | ICD-10-CM | POA: Diagnosis not present

## 2016-03-16 DIAGNOSIS — S82124S Nondisplaced fracture of lateral condyle of right tibia, sequela: Secondary | ICD-10-CM | POA: Diagnosis not present

## 2016-03-16 DIAGNOSIS — M62451 Contracture of muscle, right thigh: Secondary | ICD-10-CM | POA: Diagnosis not present

## 2016-03-21 DIAGNOSIS — S82124S Nondisplaced fracture of lateral condyle of right tibia, sequela: Secondary | ICD-10-CM | POA: Diagnosis not present

## 2016-03-21 DIAGNOSIS — M62451 Contracture of muscle, right thigh: Secondary | ICD-10-CM | POA: Diagnosis not present

## 2016-03-21 DIAGNOSIS — M25661 Stiffness of right knee, not elsewhere classified: Secondary | ICD-10-CM | POA: Diagnosis not present

## 2016-03-21 DIAGNOSIS — M25561 Pain in right knee: Secondary | ICD-10-CM | POA: Diagnosis not present

## 2016-03-23 DIAGNOSIS — S82124S Nondisplaced fracture of lateral condyle of right tibia, sequela: Secondary | ICD-10-CM | POA: Diagnosis not present

## 2016-03-23 DIAGNOSIS — M62451 Contracture of muscle, right thigh: Secondary | ICD-10-CM | POA: Diagnosis not present

## 2016-03-23 DIAGNOSIS — M25561 Pain in right knee: Secondary | ICD-10-CM | POA: Diagnosis not present

## 2016-03-23 DIAGNOSIS — M25661 Stiffness of right knee, not elsewhere classified: Secondary | ICD-10-CM | POA: Diagnosis not present

## 2016-03-27 DIAGNOSIS — M25561 Pain in right knee: Secondary | ICD-10-CM | POA: Diagnosis not present

## 2016-03-27 DIAGNOSIS — M62451 Contracture of muscle, right thigh: Secondary | ICD-10-CM | POA: Diagnosis not present

## 2016-03-27 DIAGNOSIS — M25661 Stiffness of right knee, not elsewhere classified: Secondary | ICD-10-CM | POA: Diagnosis not present

## 2016-03-27 DIAGNOSIS — S82124S Nondisplaced fracture of lateral condyle of right tibia, sequela: Secondary | ICD-10-CM | POA: Diagnosis not present

## 2016-04-17 ENCOUNTER — Ambulatory Visit: Payer: Medicare Other | Admitting: Orthopedic Surgery

## 2016-04-17 DIAGNOSIS — J060 Acute laryngopharyngitis: Secondary | ICD-10-CM | POA: Diagnosis not present

## 2016-04-17 DIAGNOSIS — R05 Cough: Secondary | ICD-10-CM | POA: Diagnosis not present

## 2016-04-24 ENCOUNTER — Encounter: Payer: Self-pay | Admitting: Orthopedic Surgery

## 2016-04-24 ENCOUNTER — Ambulatory Visit (INDEPENDENT_AMBULATORY_CARE_PROVIDER_SITE_OTHER): Payer: Medicare Other | Admitting: Orthopedic Surgery

## 2016-04-24 VITALS — BP 167/98 | HR 96 | Ht 66.0 in | Wt 300.0 lb

## 2016-04-24 DIAGNOSIS — S82141D Displaced bicondylar fracture of right tibia, subsequent encounter for closed fracture with routine healing: Secondary | ICD-10-CM | POA: Diagnosis not present

## 2016-04-24 DIAGNOSIS — M25561 Pain in right knee: Secondary | ICD-10-CM | POA: Diagnosis not present

## 2016-04-24 DIAGNOSIS — M1711 Unilateral primary osteoarthritis, right knee: Secondary | ICD-10-CM | POA: Diagnosis not present

## 2016-04-24 DIAGNOSIS — E119 Type 2 diabetes mellitus without complications: Secondary | ICD-10-CM

## 2016-04-24 DIAGNOSIS — G8929 Other chronic pain: Secondary | ICD-10-CM

## 2016-04-24 NOTE — Progress Notes (Signed)
Patient ID: Natalie Friedman, female   DOB: 1950-05-06, 66 y.o.   MRN: [id number]  Chief Complaint  Patient presents with  . Knee Problem    DISCUSS RIGHT KNEE REPLACEMENT    HPI Natalie Friedman is a 66 y.o. female.  Natalie Friedman comes back to discuss right total knee. She had a tibial plateau fracture developed a post injury flexion contracture which could not be managed with physical therapy. She has a lot of risk factors that we discussed today including diabetes, deconditioning, obesity with a elevated BMI and peripheral edema with a risk for blood clot  Review of Systems Review of Systems  Constitutional: Negative for fever.  Respiratory: Negative for shortness of breath.      Past Medical History:  Diagnosis Date  . Diabetes mellitus without complication ([place])   . Hypertension   . Occult spina bifida     Past Surgical History:  Procedure Laterality Date  . ectopic      Social History Social History  Substance Use Topics  . Smoking status: Never Smoker  . Smokeless tobacco: Never Used  . Alcohol use No    No Known Allergies  Current Meds  Medication Sig  . amLODipine (NORVASC) 10 MG tablet   . insulin detemir (LEVEMIR) 100 UNIT/ML injection Inject 20 Units into the skin at bedtime.  . Omega 3 1000 MG CAPS Take 1,000 mg by mouth.      Physical Exam Physical Exam BP (!) 167/98   Pulse 96   Ht 5\' 6"  (1.676 m)   Wt 300 lb (136.1 kg)   BMI 48.42 kg/m   Gen. appearance. The patient is well-developed and well-nourished, grooming and hygiene are normal. There are no gross congenital abnormalities  The patient is alert and oriented to person place and time  Mood and affect Emotional, flat affect  Ambulation labored ambulation supported by a cane  Examination reveals the following: She has significant edema both lower extremities she just has really big ankles and lower legs. She has flexion contracture of the right knee and limited flexion of both knees  She has  tenderness along the medial and lateral joint lines of the right knee Data Reviewed X-ray reports reveal arthritis significant in the joint of the right knee  Assessment    Encounter Diagnoses  Name Primary?  . Chronic pain of right knee Yes  . Diabetes mellitus without complication ([place])   . Tibial plateau fracture, right, closed, with routine healing, subsequent encounter   . Primary osteoarthritis of right knee        Plan    She will get an A1c test done we will hear back from her try to schedule her knee surgery before the [employer] meeting       Arther Abbott 04/24/2016, 3:53 PM

## 2016-04-25 ENCOUNTER — Telehealth: Payer: Self-pay | Admitting: Orthopedic Surgery

## 2016-04-25 NOTE — Telephone Encounter (Signed)
Patient left message on voicemail for you to give her a call. She stated that she has some information for you.  [phone]

## 2016-04-28 NOTE — Telephone Encounter (Signed)
The information is patient's request to please keep her on Dr Harrison's surgery schedule on 05/25/16 - states take from "tentative" to scheduled, as she has moved up her appointment with primary care, Dr Wende Neighbors, to 05/16/16, for follow-up and A1C labs.  Also asking if she needs to bring in a copy of these results, or will she need other notes, etc, from Dr [place] Crane, for Dr Aline Brochure to review.  Her PH# is [phone]

## 2016-05-02 NOTE — Telephone Encounter (Signed)
SPOKE WITH PATIENT AND SHE IS TENTATIVELY ON THE BOOK FOR FEB 21

## 2016-05-08 DIAGNOSIS — R609 Edema, unspecified: Secondary | ICD-10-CM | POA: Diagnosis not present

## 2016-05-08 DIAGNOSIS — R601 Generalized edema: Secondary | ICD-10-CM | POA: Diagnosis not present

## 2016-05-12 ENCOUNTER — Encounter (HOSPITAL_COMMUNITY): Payer: Self-pay

## 2016-05-12 ENCOUNTER — Inpatient Hospital Stay (HOSPITAL_COMMUNITY): Payer: Medicare Other

## 2016-05-12 ENCOUNTER — Emergency Department (HOSPITAL_COMMUNITY): Payer: Medicare Other

## 2016-05-12 ENCOUNTER — Inpatient Hospital Stay (HOSPITAL_COMMUNITY)
Admission: EM | Admit: 2016-05-12 | Discharge: 2016-05-16 | DRG: 683 | Disposition: A | Discharge status: Home / Self Care | Payer: Medicare Other | Attending: Family Medicine | Admitting: Family Medicine

## 2016-05-12 DIAGNOSIS — E1121 Type 2 diabetes mellitus with diabetic nephropathy: Secondary | ICD-10-CM | POA: Diagnosis present

## 2016-05-12 DIAGNOSIS — R809 Proteinuria, unspecified: Secondary | ICD-10-CM | POA: Diagnosis not present

## 2016-05-12 DIAGNOSIS — M7989 Other specified soft tissue disorders: Secondary | ICD-10-CM

## 2016-05-12 DIAGNOSIS — L03115 Cellulitis of right lower limb: Secondary | ICD-10-CM | POA: Diagnosis not present

## 2016-05-12 DIAGNOSIS — E669 Obesity, unspecified: Secondary | ICD-10-CM | POA: Diagnosis present

## 2016-05-12 DIAGNOSIS — Z6841 Body Mass Index (BMI) 40.0 and over, adult: Secondary | ICD-10-CM

## 2016-05-12 DIAGNOSIS — R339 Retention of urine, unspecified: Secondary | ICD-10-CM | POA: Diagnosis present

## 2016-05-12 DIAGNOSIS — N189 Chronic kidney disease, unspecified: Secondary | ICD-10-CM | POA: Diagnosis not present

## 2016-05-12 DIAGNOSIS — N049 Nephrotic syndrome with unspecified morphologic changes: Secondary | ICD-10-CM | POA: Diagnosis not present

## 2016-05-12 DIAGNOSIS — E8770 Fluid overload, unspecified: Secondary | ICD-10-CM | POA: Diagnosis present

## 2016-05-12 DIAGNOSIS — R600 Localized edema: Secondary | ICD-10-CM | POA: Diagnosis not present

## 2016-05-12 DIAGNOSIS — Z7409 Other reduced mobility: Secondary | ICD-10-CM | POA: Diagnosis present

## 2016-05-12 DIAGNOSIS — E1122 Type 2 diabetes mellitus with diabetic chronic kidney disease: Secondary | ICD-10-CM | POA: Diagnosis not present

## 2016-05-12 DIAGNOSIS — Z794 Long term (current) use of insulin: Secondary | ICD-10-CM

## 2016-05-12 DIAGNOSIS — I509 Heart failure, unspecified: Secondary | ICD-10-CM | POA: Diagnosis not present

## 2016-05-12 DIAGNOSIS — I5030 Unspecified diastolic (congestive) heart failure: Secondary | ICD-10-CM | POA: Diagnosis not present

## 2016-05-12 DIAGNOSIS — J810 Acute pulmonary edema: Secondary | ICD-10-CM | POA: Diagnosis not present

## 2016-05-12 DIAGNOSIS — R079 Chest pain, unspecified: Secondary | ICD-10-CM | POA: Diagnosis not present

## 2016-05-12 DIAGNOSIS — I10 Essential (primary) hypertension: Secondary | ICD-10-CM

## 2016-05-12 DIAGNOSIS — R0602 Shortness of breath: Secondary | ICD-10-CM | POA: Diagnosis not present

## 2016-05-12 DIAGNOSIS — I129 Hypertensive chronic kidney disease with stage 1 through stage 4 chronic kidney disease, or unspecified chronic kidney disease: Principal | ICD-10-CM | POA: Diagnosis present

## 2016-05-12 DIAGNOSIS — N289 Disorder of kidney and ureter, unspecified: Secondary | ICD-10-CM | POA: Diagnosis not present

## 2016-05-12 DIAGNOSIS — N179 Acute kidney failure, unspecified: Secondary | ICD-10-CM

## 2016-05-12 DIAGNOSIS — J811 Chronic pulmonary edema: Secondary | ICD-10-CM | POA: Diagnosis not present

## 2016-05-12 DIAGNOSIS — I110 Hypertensive heart disease with heart failure: Secondary | ICD-10-CM | POA: Diagnosis not present

## 2016-05-12 DIAGNOSIS — R2689 Other abnormalities of gait and mobility: Secondary | ICD-10-CM

## 2016-05-12 DIAGNOSIS — I160 Hypertensive urgency: Secondary | ICD-10-CM | POA: Diagnosis present

## 2016-05-12 DIAGNOSIS — E1129 Type 2 diabetes mellitus with other diabetic kidney complication: Secondary | ICD-10-CM | POA: Diagnosis not present

## 2016-05-12 LAB — COMPREHENSIVE METABOLIC PANEL
ALK PHOS: 74 U/L (ref 38–126)
ALT: 14 U/L (ref 14–54)
AST: 13 U/L — AB (ref 15–41)
Albumin: 3.2 g/dL — ABNORMAL LOW (ref 3.5–5.0)
Anion gap: 6 (ref 5–15)
BILIRUBIN TOTAL: 0.5 mg/dL (ref 0.3–1.2)
BUN: 28 mg/dL — AB (ref 6–20)
CALCIUM: 9.1 mg/dL (ref 8.9–10.3)
CHLORIDE: 101 mmol/L (ref 101–111)
CO2: 33 mmol/L — ABNORMAL HIGH (ref 22–32)
CREATININE: 1.66 mg/dL — AB (ref 0.44–1.00)
GFR calc Af Amer: 36 mL/min — ABNORMAL LOW (ref >60)
GFR, EST NON AFRICAN AMERICAN: 31 mL/min — AB (ref >60)
Glucose, Bld: 94 mg/dL (ref 65–99)
Potassium: 3.8 mmol/L (ref 3.5–5.1)
Sodium: 140 mmol/L (ref 135–145)
Total Protein: 6.4 g/dL — ABNORMAL LOW (ref 6.5–8.1)

## 2016-05-12 LAB — CBC
HEMATOCRIT: 40.9 % (ref 36.0–46.0)
HEMOGLOBIN: 12.6 g/dL (ref 12.0–15.0)
MCH: 27.8 pg (ref 26.0–34.0)
MCHC: 30.8 g/dL (ref 30.0–36.0)
MCV: 90.1 fL (ref 78.0–100.0)
PLATELETS: 210 10*3/uL (ref 150–400)
RBC: 4.54 MIL/uL (ref 3.87–5.11)
RDW: 14.3 % (ref 11.5–15.5)
WBC: 6.7 10*3/uL (ref 4.0–10.5)

## 2016-05-12 LAB — D-DIMER, QUANTITATIVE (NOT AT ARMC): D DIMER QUANT: 1.28 ug{FEU}/mL — AB (ref 0.00–0.50)

## 2016-05-12 LAB — URINALYSIS, ROUTINE W REFLEX MICROSCOPIC
Bilirubin Urine: NEGATIVE
GLUCOSE, UA: 50 mg/dL — AB
Hgb urine dipstick: NEGATIVE
Ketones, ur: NEGATIVE mg/dL
LEUKOCYTES UA: NEGATIVE
Nitrite: NEGATIVE
PROTEIN: 100 mg/dL — AB
SPECIFIC GRAVITY, URINE: 1.01 (ref 1.005–1.030)
pH: 7 (ref 5.0–8.0)

## 2016-05-12 LAB — GLUCOSE, CAPILLARY: Glucose-Capillary: 134 mg/dL — ABNORMAL HIGH (ref 65–99)

## 2016-05-12 LAB — BRAIN NATRIURETIC PEPTIDE: B Natriuretic Peptide: 267 pg/mL — ABNORMAL HIGH (ref 0.0–100.0)

## 2016-05-12 LAB — TROPONIN I
Troponin I: 0.05 ng/mL (ref <0.03)
Troponin I: 0.04 ng/mL (ref <0.03)

## 2016-05-12 MED ORDER — TECHNETIUM TO 99M ALBUMIN AGGREGATED
4.0000 | Freq: Once | INTRAVENOUS | Status: AC | PRN
  Administered 2016-05-12: 4.4 via INTRAVENOUS

## 2016-05-12 MED ORDER — CARVEDILOL 3.125 MG PO TABS
3.1250 mg | ORAL_TABLET | Freq: Two times a day (BID) | ORAL | Status: DC
  Administered 2016-05-12 – 2016-05-16 (×9): 3.125 mg via ORAL
  Filled 2016-05-12 (×10): qty 1

## 2016-05-12 MED ORDER — ACETAMINOPHEN 325 MG PO TABS: 650.0000 mg | ORAL_TABLET | ORAL | Status: DC | PRN

## 2016-05-12 MED ORDER — POTASSIUM CHLORIDE CRYS ER 20 MEQ PO TBCR
40.0000 meq | EXTENDED_RELEASE_TABLET | Freq: Two times a day (BID) | ORAL | Status: DC
  Administered 2016-05-12 – 2016-05-16 (×8): 40 meq via ORAL
  Filled 2016-05-12 (×9): qty 2

## 2016-05-12 MED ORDER — TECHNETIUM TC 99M DIETHYLENETRIAME-PENTAACETIC ACID
30.0000 | Freq: Once | INTRAVENOUS | Status: AC | PRN
  Administered 2016-05-12: 32 via RESPIRATORY_TRACT

## 2016-05-12 MED ORDER — ZOLPIDEM TARTRATE 5 MG PO TABS: 5.0000 mg | ORAL_TABLET | Freq: Every evening | ORAL | Status: DC | PRN

## 2016-05-12 MED ORDER — ASPIRIN EC 81 MG PO TBEC
81.0000 mg | DELAYED_RELEASE_TABLET | Freq: Every day | ORAL | Status: DC
  Administered 2016-05-13 – 2016-05-16 (×4): 81 mg via ORAL
  Filled 2016-05-12 (×4): qty 1

## 2016-05-12 MED ORDER — INSULIN ASPART 100 UNIT/ML ~~LOC~~ SOLN
3.0000 [IU] | Freq: Three times a day (TID) | SUBCUTANEOUS | Status: DC
  Administered 2016-05-13 – 2016-05-16 (×11): 3 [IU] via SUBCUTANEOUS

## 2016-05-12 MED ORDER — INSULIN ASPART 100 UNIT/ML ~~LOC~~ SOLN
0.0000 [IU] | Freq: Three times a day (TID) | SUBCUTANEOUS | Status: DC
  Administered 2016-05-13 (×2): 1 [IU] via SUBCUTANEOUS
  Administered 2016-05-14: 2 [IU] via SUBCUTANEOUS
  Administered 2016-05-14 – 2016-05-15 (×2): 1 [IU] via SUBCUTANEOUS
  Administered 2016-05-15: 2 [IU] via SUBCUTANEOUS
  Administered 2016-05-15: 1 [IU] via SUBCUTANEOUS
  Administered 2016-05-16: 2 [IU] via SUBCUTANEOUS

## 2016-05-12 MED ORDER — ONDANSETRON HCL 4 MG/2ML IJ SOLN: 4.0000 mg | Freq: Four times a day (QID) | INTRAMUSCULAR | Status: DC | PRN

## 2016-05-12 MED ORDER — SODIUM CHLORIDE 0.9 % IV SOLN: 250.0000 mL | INTRAVENOUS | Status: DC | PRN

## 2016-05-12 MED ORDER — INSULIN DETEMIR 100 UNIT/ML ~~LOC~~ SOLN
10.0000 [IU] | Freq: Every day | SUBCUTANEOUS | Status: DC
  Administered 2016-05-12 – 2016-05-15 (×4): 10 [IU] via SUBCUTANEOUS
  Filled 2016-05-12 (×5): qty 0.1

## 2016-05-12 MED ORDER — FUROSEMIDE 10 MG/ML IJ SOLN
40.0000 mg | Freq: Once | INTRAMUSCULAR | Status: AC
  Administered 2016-05-12: 40 mg via INTRAVENOUS
  Filled 2016-05-12: qty 4

## 2016-05-12 MED ORDER — HEPARIN SODIUM (PORCINE) 5000 UNIT/ML IJ SOLN
5000.0000 [IU] | Freq: Three times a day (TID) | INTRAMUSCULAR | Status: DC
  Administered 2016-05-12 – 2016-05-16 (×11): 5000 [IU] via SUBCUTANEOUS
  Filled 2016-05-12 (×12): qty 1

## 2016-05-12 MED ORDER — SODIUM CHLORIDE 0.9% FLUSH
3.0000 mL | Freq: Two times a day (BID) | INTRAVENOUS | Status: DC
  Administered 2016-05-12 – 2016-05-16 (×8): 3 mL via INTRAVENOUS

## 2016-05-12 MED ORDER — HYDRALAZINE HCL 20 MG/ML IJ SOLN: 10.0000 mg | INTRAMUSCULAR | Status: DC | PRN

## 2016-05-12 MED ORDER — FUROSEMIDE 10 MG/ML IJ SOLN
40.0000 mg | Freq: Three times a day (TID) | INTRAMUSCULAR | Status: DC
Start: 2016-05-12 — End: 2016-05-14
  Administered 2016-05-12 – 2016-05-14 (×5): 40 mg via INTRAVENOUS
  Filled 2016-05-12 (×5): qty 4

## 2016-05-12 MED ORDER — ORAL CARE MOUTH RINSE
15.0000 mL | Freq: Two times a day (BID) | OROMUCOSAL | Status: DC
  Administered 2016-05-12 – 2016-05-16 (×6): 15 mL via OROMUCOSAL

## 2016-05-12 MED ORDER — SODIUM CHLORIDE 0.9% FLUSH: 3.0000 mL | INTRAVENOUS | Status: DC | PRN

## 2016-05-12 NOTE — H&P (Signed)
History and Physical  Natalie Friedman TDD:[id number] DOB: 09-10-1950 DOA: 05/12/2016  Referring physician: Tomi Bamberger PCP: Wende Neighbors, MD   Chief Complaint: edema  HPI: Natalie Friedman is a 66 y.o. female with past medical history of hypertension, no known history of heart failure who was sent to the emergency department after being seen by the PCP earlier today. There was concerned with worsening edema in the lower extremities. The patient had recently been treated for respiratory infection steroid taper. The patient reported that she continued to have increasing edema and shortness of breath over the past 2 weeks. The patient had been started on oral Lasix by the primary care provider there has been no significant improvement in the edema. She reports a gain of 30 pounds in weight over the past 2 weeks. The patient denies chest pain. The patient reports a dry hacking cough and shortness of breath with minimal exertion. In the emergency department to have hypertension with blood pressures in the 623J systolic range. The patient reported that she takes herbal medications to control her blood pressure. The patient also reports that she did not tolerate ACE inhibitor secondary to chronic cough. She also reports that she had intolerance to beta blockers secondary to severe fatigue. She was noted in the ED to have an elevated BNP and some pulmonary edema on chest x-ray. The patient reports that because of a knee injury she has been essentially immobile for the past 3 months. She reports very minimal ambulation. The patient is going to be admitted into the hospital for an acute exacerbation of CHF. She does not have a known history of this. She was also noted to have renal insufficiency which is not known to be chronic however old records and labs not available.  Review of Systems: All systems reviewed and apart from history of presenting illness, are negative.  Past Medical History:  Diagnosis Date  . Diabetes  mellitus without complication ([place])   . Hypertension   . Occult spina bifida    Past Surgical History:  Procedure Laterality Date  . ectopic    . patella fx Right 07/2015   Social History:  reports that she has never smoked. She has never used smokeless tobacco. She reports that she does not drink alcohol or use drugs.  No Known Allergies  History reviewed. No pertinent family history.   Prior to Admission medications   Medication Sig Start Date End Date Taking? Authorizing Provider  amLODipine (NORVASC) 10 MG tablet Take 10 mg by mouth daily.  07/26/15  Yes Historical Provider, MD  furosemide (LASIX) 20 MG tablet Take 60 mg by mouth daily.  05/08/16  Yes Historical Provider, MD  insulin detemir (LEVEMIR) 100 UNIT/ML injection Inject 20 Units into the skin at bedtime.   Yes Historical Provider, MD  Omega 3 1000 MG CAPS Take 1,000 mg by mouth.   Yes Historical Provider, MD  potassium chloride SA (K-DUR,KLOR-CON) 20 MEQ tablet Take 20 mEq by mouth daily.  05/08/16  Yes Historical Provider, MD   Physical Exam: Vitals:   05/12/16 1200 05/12/16 1230 05/12/16 1334 05/12/16 1400  BP: 174/84 176/89 (!) 206/95 (!) 203/96  Pulse: 66 66 74 79  Resp: 20   13  Temp:      TempSrc:      SpO2: 96% 97% 96% 98%  Weight:      Height:         General exam: obese female, lying comfortably supine on the gurney in no obvious  distress.  Head, eyes and ENT: Nontraumatic and normocephalic. Pupils equally reacting to light and accommodation. Oral mucosa moist.  Neck: Supple. No thyromegaly.  Lymphatics: No lymphadenopathy.  Respiratory system:  No increased work of breathing.  Cardiovascular system: S1 and S2 heard.   Gastrointestinal system: Abdomen is distended, soft and nontender. Normal bowel sounds heard. No organomegaly or masses appreciated.  Central nervous system: Alert and oriented. No focal neurological deficits.  Extremities: Symmetric 5 x 5 power. Peripheral pulses symmetrically  felt.  2+ pitting edema bilateral lower extremities.  Skin: chronic venous stasis changes BLEs.  Psychiatry: Pleasant and cooperative.  Labs on Admission:  Basic Metabolic Panel:  Recent Labs Lab 05/12/16 1202  NA 140  K 3.8  CL 101  CO2 33*  GLUCOSE 94  BUN 28*  CREATININE 1.66*  CALCIUM 9.1   Liver Function Tests:  Recent Labs Lab 05/12/16 1202  AST 13*  ALT 14  ALKPHOS 74  BILITOT 0.5  PROT 6.4*  ALBUMIN 3.2*   No results for input(s): LIPASE, AMYLASE in the last 168 hours. No results for input(s): AMMONIA in the last 168 hours. CBC:  Recent Labs Lab 05/12/16 1202  WBC 6.7  HGB 12.6  HCT 40.9  MCV 90.1  PLT 210   Cardiac Enzymes:  Recent Labs Lab 05/12/16 1202  TROPONINI 0.05*    BNP (last 3 results) No results for input(s): PROBNP in the last 8760 hours. CBG: No results for input(s): GLUCAP in the last 168 hours.  Radiological Exams on Admission: Dg Chest 2 View  Result Date: 05/12/2016 CLINICAL DATA:  Urinary retention.  Lower extremity swelling. EXAM: CHEST  2 VIEW COMPARISON:  None. FINDINGS: The patient has marked cardiomegaly. There is interstitial pulmonary edema. No pneumothorax or pleural effusion. No focal airspace disease. IMPRESSION: Cardiomegaly and mild interstitial edema. Electronically Signed   By: Inge Rise M.D.   On: 05/12/2016 12:33   EKG: Independently reviewed. NSR  Assessment/Plan Principal Problem:   CHF (congestive heart failure) (HCC) Active Problems:   Hypertensive urgency   Uncontrolled hypertension   DM (diabetes mellitus), type 2 with renal complications (HCC)   anasarca   Immobility   Proteinuria   1. Acute exacerbation of CHF - new diagnosis - given cardiomegaly, elevated BNP, poorly controlled hypertension I suspect this is congestive heart failure. The patient will need an echocardiogram to evaluate heart function and valves. Cardiology consult requested. Diuresis with IV furosemide, monitor intake  and output, daily weights. Monitor electrolytes closely. Evaluate for right heart strain. The patient does have immobility and will evaluate for PE. 2. Hypertensive urgency-IV hydralazine ordered. IV lasix as noted above.   3. Anasarca - secondary to profound volume overload, diuresing as noted above and follow closely. Discontinue amlodipine. 4. Proteinuria-I'm concerned about diabetic nephropathy. Monitor closely. Check renal ultrasound. 5. Acute on chronic renal insufficiency-unable to obtain any old labs to compare previous renal function tests but I suspect she has a chronic renal insufficiency secondary to medical renal disease including uncontrolled hypertension and diabetes mellitus. Requesting nephrology consultation. 6. Mobility-patient reports being immobile for last 3 months. I'm concerned about DVT, check ultrasound venous lower extremity studies. Check d-dimer. If elevated check VQ scan. 7. Diabetes mellitus with renal complications-resume basal insulin, reduced dose given renal insufficiency, monitor blood glucose with supplemental scale coverage as needed. Check A1c. 8. Order placed to try and obtain old records and labs.    DVT Prophylaxis: hep Code Status: full  Family Communication: bedside  Disposition  Plan: TBD   Time spent: 60 mins  Irwin Brakeman, MD [facility] Pager [phone]  If 7PM-7AM, please contact night-coverage [email or web address] Password [email or web address] 05/12/2016, 2:40 PM

## 2016-05-12 NOTE — Progress Notes (Signed)
Pt in room with side rails up and call light within reach. Nothing needed at this time.

## 2016-05-12 NOTE — ED Provider Notes (Signed)
[facility] DEPT Provider Note   CSN: [id number] Arrival date & time: 05/12/16  1006  By signing my name below, I, Hilbert Odor, attest that this documentation has been prepared under the direction and in the presence of Dorie Rank, MD. Electronically Signed: Hilbert Odor, Scribe. 05/12/16. 11:44 AM. History   Chief Complaint Chief Complaint  Patient presents with  . Urinary Retention  . Leg Swelling     The history is provided by the patient. No language interpreter was used.   HPI Comments: Natalie Friedman is a 66 y.o. female who presents to the Emergency Department complaining of urinary retention with associated edema for the past couple of weeks. Patient was recently diagnosed with bronchitis a couple of weeks and was given steroids. Shortly after she noticed some edema in her legs. She was seen by her PCP on Tuesday and was given Lasix with no significant relief thus far. She states that she has had significant weight gain over the past month. She reports a gain of 30 lbs. She also reports a dry cough and some SOB upon minimal exertion. She has a hx of diabetes.  Past Medical History:  Diagnosis Date  . Diabetes mellitus without complication ([place])   . Hypertension   . Occult spina bifida     Patient Active Problem List   Diagnosis Date Noted  . Diabetes mellitus without complication ([place]) 16/04/930  . Hypertension 08/19/2015    Past Surgical History:  Procedure Laterality Date  . ectopic    . patella fx Right 07/2015    OB History    No data available       Home Medications    Prior to Admission medications   Medication Sig Start Date End Date Taking? Authorizing Provider  amLODipine (NORVASC) 10 MG tablet Take 10 mg by mouth daily.  07/26/15  Yes Historical Provider, MD  furosemide (LASIX) 20 MG tablet Take 60 mg by mouth daily.  05/08/16  Yes Historical Provider, MD  insulin detemir (LEVEMIR) 100 UNIT/ML injection Inject 20 Units into the skin at  bedtime.   Yes Historical Provider, MD  Omega 3 1000 MG CAPS Take 1,000 mg by mouth.   Yes Historical Provider, MD  potassium chloride SA (K-DUR,KLOR-CON) 20 MEQ tablet Take 20 mEq by mouth daily.  05/08/16  Yes Historical Provider, MD    Family History No family history on file.  Social History Social History  Substance Use Topics  . Smoking status: Never Smoker  . Smokeless tobacco: Never Used  . Alcohol use No     Allergies   Patient has no known allergies.   Review of Systems Review of Systems  All other systems reviewed and are negative.    Physical Exam Updated Vital Signs BP (!) 206/95   Pulse 74   Temp 98.1 F (36.7 C) (Oral)   Resp 20   Ht 5\' 6"  (1.676 m)   Wt (!) 139.4 kg   SpO2 96%   BMI 49.60 kg/m   Physical Exam  Constitutional: No distress.  Overweight.  HENT:  Head: Normocephalic and atraumatic.  Right Ear: External ear normal.  Left Ear: External ear normal.  Eyes: Conjunctivae are normal. Right eye exhibits no discharge. Left eye exhibits no discharge. No scleral icterus.  Neck: Neck supple. No tracheal deviation present.  Cardiovascular: Normal rate, regular rhythm and intact distal pulses.   Pulmonary/Chest: Effort normal and breath sounds normal. No stridor. No respiratory distress. She has no wheezes. She has no rales.  Abdominal: Soft. Bowel sounds are normal. She exhibits no distension. There is no tenderness. There is no rebound and no guarding.  Edema of the pannus.  Musculoskeletal: She exhibits edema. She exhibits no tenderness.  Large amount of edema bilateral lower extremities up to the thighs.   Neurological: She is alert. She has normal strength. No cranial nerve deficit (no facial droop, extraocular movements intact, no slurred speech) or sensory deficit. She exhibits normal muscle tone. She displays no seizure activity. Coordination normal.  Skin: Skin is warm and dry. No rash noted. She is not diaphoretic.  Psychiatric: She has  a normal mood and affect.  Nursing note and vitals reviewed.    ED Treatments / Results  DIAGNOSTIC STUDIES: Oxygen Saturation is 90% on RA, normal by my interpretation.    COORDINATION OF CARE: 11:07 AM Discussed treatment plan with pt at bedside, which includes labs and CXR, and pt agreed to plan.  Labs (all labs ordered are listed, but only abnormal results are displayed) Labs Reviewed  COMPREHENSIVE METABOLIC PANEL - Abnormal; Notable for the following:       Result Value   CO2 33 (*)    BUN 28 (*)    Creatinine, Ser 1.66 (*)    Total Protein 6.4 (*)    Albumin 3.2 (*)    AST 13 (*)    GFR calc non Af Amer 31 (*)    GFR calc Af Amer 36 (*)    All other components within normal limits  TROPONIN I - Abnormal; Notable for the following:    Troponin I 0.05 (*)    All other components within normal limits  BRAIN NATRIURETIC PEPTIDE - Abnormal; Notable for the following:    B Natriuretic Peptide 267.0 (*)    All other components within normal limits  URINALYSIS, ROUTINE W REFLEX MICROSCOPIC - Abnormal; Notable for the following:    Glucose, UA 50 (*)    Protein, ur 100 (*)    Bacteria, UA RARE (*)    All other components within normal limits  CBC    EKG  EKG Interpretation  Date/Time:  Friday May 12 2016 10:40:28 EST Ventricular Rate:  79 PR Interval:    QRS Duration: 98 QT Interval:  409 QTC Calculation: 469 R Axis:   -111 Text Interpretation:  Sinus rhythm Atrial premature complex Left anterior fascicular block Consider right ventricular hypertrophy Consider anterior infarct No old tracing to compare Confirmed by Rubel Heckard  MD-J, Giordan Fordham ([id number]) on 05/12/2016 12:05:35 PM       Radiology Dg Chest 2 View  Result Date: 05/12/2016 CLINICAL DATA:  Urinary retention.  Lower extremity swelling. EXAM: CHEST  2 VIEW COMPARISON:  None. FINDINGS: The patient has marked cardiomegaly. There is interstitial pulmonary edema. No pneumothorax or pleural effusion. No focal airspace  disease. IMPRESSION: Cardiomegaly and mild interstitial edema. Electronically Signed   By: Inge Rise M.D.   On: 05/12/2016 12:33    Procedures Procedures (including critical care time)  Medications Ordered in ED Medications  furosemide (LASIX) injection 40 mg (not administered)     Initial Impression / Assessment and Plan / ED Course  I have reviewed the triage vital signs and the nursing notes.  Pertinent labs & imaging results that were available during my care of the patient were reviewed by me and considered in my medical decision making (see chart for details).  Clinical Course as of May 12 1353  Fri May 12, 2016  1217 Elevated protein in the urine.  ?  nephrotic syndrome  [JK]  1327 ?CHF vs nephrotic syndome.  She does have an elevated BNP and signs of pulm edema.  Will consult with medical service for admission, further evaluation.  [JK]    Clinical Course User Index [JK] Dorie Rank, MD   Patient's x-ray shows cardiomegaly and pulmonary edema. She also has proteinuria and renal deficiency. Possible her symptoms may be related to congestive heart failure less likely nephrotic syndrome. She has already been on oral Lasix without any significant relief. Patient does not have a history of congestive heart failure.  Consult with medical service for admission for further evaluation and treatment.  Final Clinical Impressions(s) / ED Diagnoses   Final diagnoses:  Acute pulmonary edema (HCC)  Acute on chronic congestive heart failure, unspecified congestive heart failure type (HCC)  Proteinuria, unspecified type   I personally performed the services described in this documentation, which was scribed in my presence.  The recorded information has been reviewed and is accurate.    Dorie Rank, MD 05/12/16 1355

## 2016-05-12 NOTE — ED Notes (Signed)
CRITICAL VALUE ALERT  Critical value received:  Troponin 0.05  Date of notification:  05/12/16  Time of notification:  1305  Critical value read back:yes  Nurse who received alert:  Tilden Fossa RN  MD notified (1st page):  Hillard Danker   Time of first page:  1306  MD notified (2nd page):  Time of second page:  Responding MD:  Hillard Danker  Time MD responded:  [phone]

## 2016-05-12 NOTE — ED Triage Notes (Signed)
Pt sent by PA from Dr Durene Cal office for evaluation of urinary retention, edema in lower extremities and abdomen, and abnormal kidney function. Pt reports that she feels as if she is not emptying bladder completely. States treated for bronchitis couple weeks ago and was given steroids and noticed edema in legs. Was given lasix of Tuesday by provider

## 2016-05-13 ENCOUNTER — Inpatient Hospital Stay (HOSPITAL_COMMUNITY): Payer: Medicare Other

## 2016-05-13 DIAGNOSIS — R079 Chest pain, unspecified: Secondary | ICD-10-CM

## 2016-05-13 LAB — BASIC METABOLIC PANEL
Anion gap: 7 (ref 5–15)
BUN: 28 mg/dL — AB (ref 6–20)
CHLORIDE: 101 mmol/L (ref 101–111)
CO2: 35 mmol/L — ABNORMAL HIGH (ref 22–32)
CREATININE: 1.8 mg/dL — AB (ref 0.44–1.00)
Calcium: 8.7 mg/dL — ABNORMAL LOW (ref 8.9–10.3)
GFR calc Af Amer: 32 mL/min — ABNORMAL LOW (ref >60)
GFR, EST NON AFRICAN AMERICAN: 28 mL/min — AB (ref >60)
Glucose, Bld: 145 mg/dL — ABNORMAL HIGH (ref 65–99)
Potassium: 3.8 mmol/L (ref 3.5–5.1)
SODIUM: 143 mmol/L (ref 135–145)

## 2016-05-13 LAB — CBC WITH DIFFERENTIAL/PLATELET
Basophils Absolute: 0 10*3/uL (ref 0.0–0.1)
Basophils Relative: 0 %
EOS ABS: 0.1 10*3/uL (ref 0.0–0.7)
EOS PCT: 2 %
HCT: 38.7 % (ref 36.0–46.0)
Hemoglobin: 12 g/dL (ref 12.0–15.0)
LYMPHS ABS: 1.4 10*3/uL (ref 0.7–4.0)
Lymphocytes Relative: 21 %
MCH: 27.9 pg (ref 26.0–34.0)
MCHC: 31 g/dL (ref 30.0–36.0)
MCV: 90 fL (ref 78.0–100.0)
MONO ABS: 0.6 10*3/uL (ref 0.1–1.0)
Monocytes Relative: 10 %
Neutro Abs: 4.4 10*3/uL (ref 1.7–7.7)
Neutrophils Relative %: 67 %
PLATELETS: 196 10*3/uL (ref 150–400)
RBC: 4.3 MIL/uL (ref 3.87–5.11)
RDW: 14.4 % (ref 11.5–15.5)
WBC: 6.5 10*3/uL (ref 4.0–10.5)

## 2016-05-13 LAB — TROPONIN I
TROPONIN I: 0.04 ng/mL — AB (ref <0.03)
Troponin I: 0.04 ng/mL (ref <0.03)

## 2016-05-13 LAB — GLUCOSE, CAPILLARY
GLUCOSE-CAPILLARY: 124 mg/dL — AB (ref 65–99)
GLUCOSE-CAPILLARY: 161 mg/dL — AB (ref 65–99)
Glucose-Capillary: 115 mg/dL — ABNORMAL HIGH (ref 65–99)
Glucose-Capillary: 133 mg/dL — ABNORMAL HIGH (ref 65–99)

## 2016-05-13 LAB — MAGNESIUM: MAGNESIUM: 1.9 mg/dL (ref 1.7–2.4)

## 2016-05-13 LAB — ECHOCARDIOGRAM COMPLETE
HEIGHTINCHES: 66 in
WEIGHTICAEL: 4698.44 [oz_av]

## 2016-05-13 NOTE — Progress Notes (Signed)
PROGRESS NOTE    Natalie Friedman  AOZ:[id number]  DOB: [birth year]  DOA: 05/12/2016 PCP: Wende Neighbors, MD  Hospital course: Yolandra Habig is a 66 y.o. female with past medical history of hypertension, no known history of heart failure who was sent to the emergency department after being seen by the PCP earlier today. There was concerned with worsening edema in the lower extremities.  She was admitted with CHF exacerbation and renal insufficiency.   Assessment & Plan:   1. Acute exacerbation of CHF - new diagnosis - given cardiomegaly, elevated BNP, poorly controlled hypertension I suspect this is congestive heart failure. The patient will need an echocardiogram to evaluate heart function and valves. Cardiology consult requested. Diuresis with IV furosemide, monitor intake and output, daily weights. Monitor electrolytes closely. Evaluate for right heart strain. The patient does have immobility and will evaluate for PE. 2. Hypertensive urgency-IV hydralazine ordered. IV lasix as noted above.   3. Anasarca - secondary to profound volume overload, diuresing as noted above and follow closely. Discontinue amlodipine. 4. Proteinuria-I'm concerned about diabetic nephropathy. Monitor closely. Check renal ultrasound. 5. Acute on chronic renal insufficiency-unable to obtain any old labs to compare previous renal function tests but I suspect she has a chronic renal insufficiency secondary to medical renal disease including uncontrolled hypertension and diabetes mellitus. Appreciate nephrology consultation. 6. Mobility-patient reports being immobile for last 3 months.  BLE dopplers and VQ scan negative for VTE. 7. Diabetes mellitus with renal complications-resume basal insulin, reduced dose given renal insufficiency, monitor blood glucose with supplemental scale coverage as needed. Check A1c. 8. Order placed to try and obtain old records and labs.    DVT Prophylaxis: hep Code Status: full  Family  Communication: bedside  Disposition Plan: TBD   Consultants:  Cardiology  nephrology  Subjective: Pt is starting to feel better after diuresis.  She has lost over 7 pounds since admission.    Objective: Vitals:   05/12/16 1500 05/12/16 1514 05/12/16 2012 05/13/16 0404  BP: (!) 141/122 157/95 (!) 167/69 (!) 151/68  Pulse:  77 71 69  Resp:   16 16  Temp:   98.3 F (36.8 C) 97.8 F (36.6 C)  TempSrc:   Oral Oral  SpO2:  93% 96% 96%  Weight:   133.3 kg (293 lb 14 oz) 133.2 kg (293 lb 10.4 oz)  Height:        Intake/Output Summary (Last 24 hours) at 05/13/16 0942 Last data filed at 05/13/16 0900  Gross per 24 hour  Intake              483 ml  Output             4300 ml  Net            -3817 ml   Filed Weights   05/12/16 1028 05/12/16 2012 05/13/16 0404  Weight: (!) 139.4 kg (307 lb 4.8 oz) 133.3 kg (293 lb 14 oz) 133.2 kg (293 lb 10.4 oz)    Exam:  General exam: obese female, lying comfortably supine on the gurney in no obvious distress.  Respiratory system:  No increased work of breathing. Cardiovascular system: S1 & S2 heard.  Gastrointestinal system: Abdomen is nondistended, soft and nontender. Normal bowel sounds heard. Central nervous system: Alert and oriented. No focal neurological deficits. Extremities: 2+ edema bilateral LEs.  Data Reviewed: Basic Metabolic Panel:  Recent Labs Lab 05/12/16 1202 05/13/16 0337  NA 140 143  K 3.8 3.8  CL 101 101  CO2 33* 35*  GLUCOSE 94 145*  BUN 28* 28*  CREATININE 1.66* 1.80*  CALCIUM 9.1 8.7*  MG  --  1.9   Liver Function Tests:  Recent Labs Lab 05/12/16 1202  AST 13*  ALT 14  ALKPHOS 74  BILITOT 0.5  PROT 6.4*  ALBUMIN 3.2*   No results for input(s): LIPASE, AMYLASE in the last 168 hours. No results for input(s): AMMONIA in the last 168 hours. CBC:  Recent Labs Lab 05/12/16 1202 05/13/16 0337  WBC 6.7 6.5  NEUTROABS  --  4.4  HGB 12.6 12.0  HCT 40.9 38.7  MCV 90.1 90.0  PLT 210 196    Cardiac Enzymes:  Recent Labs Lab 05/12/16 1202 05/12/16 1608 05/12/16 2239 05/13/16 0337  TROPONINI 0.05* 0.04* 0.04* 0.04*   CBG (last 3)   Recent Labs  05/12/16 2109 05/13/16 0724  GLUCAP 134* 115*   No results found for this or any previous visit (from the past 240 hour(s)).   Studies: Dg Chest 2 View  Result Date: 05/12/2016 CLINICAL DATA:  Urinary retention.  Lower extremity swelling. EXAM: CHEST  2 VIEW COMPARISON:  None. FINDINGS: The patient has marked cardiomegaly. There is interstitial pulmonary edema. No pneumothorax or pleural effusion. No focal airspace disease. IMPRESSION: Cardiomegaly and mild interstitial edema. Electronically Signed   By: Inge Rise M.D.   On: 05/12/2016 12:33   US Renal  Result Date: 05/12/2016 CLINICAL DATA:  Acute renal insufficiency. EXAM: RENAL / URINARY TRACT ULTRASOUND COMPLETE COMPARISON:  None. FINDINGS: Right Kidney: Length: 10.4 cm. Normal renal cortical thickness and echogenicity without focal lesions or hydronephrosis. No renal calculi. Left Kidney: Length: 12.3 cm. Normal renal cortical thickness and echogenicity without focal lesions or hydronephrosis. No renal calculi. Bladder: Normal IMPRESSION: Normal renal ultrasound examination. Electronically Signed   By: Marijo Sanes M.D.   On: 05/12/2016 17:01   Nm Pulmonary Perf And Vent  Result Date: 05/12/2016 CLINICAL DATA:  Shortness of breath and cough for 2 weeks. EXAM: NUCLEAR MEDICINE VENTILATION - PERFUSION LUNG SCAN TECHNIQUE: Ventilation images were obtained in multiple projections using inhaled aerosol Tc-87m DTPA. Perfusion images were obtained in multiple projections after intravenous injection of Tc-25m MAA. RADIOPHARMACEUTICALS:  Thirty-two mCi Technetium-29m DTPA aerosol inhalation and 4.4 mCi Technetium-62m MAA IV COMPARISON:  Chest x-ray 11/29/2016 FINDINGS: Ventilation: Deposition of pharmaceutical along the airways. Perfusion: No wedge shaped peripheral perfusion  defects to suggest acute pulmonary embolism. IMPRESSION: Low probability for clinically significant pulmonary embolus. Electronically Signed   By: Nolon Nations M.D.   On: 05/12/2016 20:17   US Venous Img Lower Bilateral  Result Date: 05/12/2016 CLINICAL DATA:  Bilateral lower extremity edema with shortness of breath and cough. EXAM: BILATERAL LOWER EXTREMITY VENOUS DUPLEX ULTRASOUND TECHNIQUE: Doppler venous assessment of the bilateral lower extremity deep venous system was performed, including characterization of spectral flow, compressibility, and phasicity. COMPARISON:  None. FINDINGS: There is complete compressibility of the bilateral common femoral, femoral, and popliteal veins. Doppler analysis demonstrates respiratory phasicity and augmentation of flow with calf compression. No obvious superficial vein or calf vein thrombosis. IMPRESSION: No evidence of lower extremity DVT. Electronically Signed   By: Marybelle Killings M.D.   On: 05/12/2016 15:40   Scheduled Meds: . aspirin EC  81 mg Oral Daily  . carvedilol  3.125 mg Oral BID WC  . furosemide  40 mg Intravenous Q8H  . heparin  5,000 Units Subcutaneous Q8H  . insulin aspart  0-9 Units Subcutaneous TID WC  . insulin  aspart  3 Units Subcutaneous TID WC  . insulin detemir  10 Units Subcutaneous QHS  . mouth rinse  15 mL Mouth Rinse BID  . potassium chloride SA  40 mEq Oral BID  . sodium chloride flush  3 mL Intravenous Q12H   Continuous Infusions:  Principal Problem:   CHF (congestive heart failure) (HCC) Active Problems:   Hypertensive urgency   Uncontrolled hypertension   DM (diabetes mellitus), type 2 with renal complications (HCC)   anasarca   Immobility   Proteinuria  Time spent:   Irwin Brakeman, MD, FAAFP [facility] Pager [phone]  If 7PM-7AM, please contact night-coverage [email or web address] Password [email or web address] 05/13/2016, 9:42 AM    LOS: 1 day

## 2016-05-13 NOTE — Consult Note (Signed)
Reason for Consult: Renal failure Referring Physician: Dr. Dolores Lory Fredenburg is an 66 y.o. female.  HPI: She is a patient with history of diabetes, hypertension, history of right knee injury presently came to the emergency room with complaints of increasing leg swelling, difficulty breathing for the last couple of weeks. According to patient she has recent history of bronchitis and was treated with steroid. After she completed she started having leg swelling, gained about 30 pounds the last couple of weeks. According to patient she has history of renal failure about 2 years ago which was thought to be secondary to medications. However she states that her renal function has improved and recently she thought it has recovered. Patient denies any previous history of kidney stone. She denies using any nonsteroidal however patient seems to be using some herbal medication and at this moment she does not the names. Patient still complains of weakness but otherwise feels better.  Past Medical History:  Diagnosis Date  . Diabetes mellitus without complication ([place])   . Hypertension   . Occult spina bifida     Past Surgical History:  Procedure Laterality Date  . ectopic    . patella fx Right 07/2015    History reviewed. No pertinent family history.  Social History:  reports that she has never smoked. She has never used smokeless tobacco. She reports that she does not drink alcohol or use drugs.  Allergies: No Known Allergies  Medications: I have reviewed the patient's current medications.  Results for orders placed or performed during the hospital encounter of 05/12/16 (from the past 48 hour(s))  Urinalysis, Routine w reflex microscopic     Status: Abnormal   Collection Time: 05/12/16 11:13 AM  Result Value Ref Range   Color, Urine YELLOW YELLOW   APPearance CLEAR CLEAR   Specific Gravity, Urine 1.010 1.005 - 1.030   pH 7.0 5.0 - 8.0   Glucose, UA 50 (A) NEGATIVE mg/dL   Hgb urine  dipstick NEGATIVE NEGATIVE   Bilirubin Urine NEGATIVE NEGATIVE   Ketones, ur NEGATIVE NEGATIVE mg/dL   Protein, ur 100 (A) NEGATIVE mg/dL   Nitrite NEGATIVE NEGATIVE   Leukocytes, UA NEGATIVE NEGATIVE   RBC / HPF 0-5 0 - 5 RBC/hpf   WBC, UA 0-5 0 - 5 WBC/hpf   Bacteria, UA RARE (A) NONE SEEN  CBC     Status: None   Collection Time: 05/12/16 12:02 PM  Result Value Ref Range   WBC 6.7 4.0 - 10.5 K/uL   RBC 4.54 3.87 - 5.11 MIL/uL   Hemoglobin 12.6 12.0 - 15.0 g/dL   HCT 40.9 36.0 - 46.0 %   MCV 90.1 78.0 - 100.0 fL   MCH 27.8 26.0 - 34.0 pg   MCHC 30.8 30.0 - 36.0 g/dL   RDW 14.3 11.5 - 15.5 %   Platelets 210 150 - 400 K/uL  Comprehensive metabolic panel     Status: Abnormal   Collection Time: 05/12/16 12:02 PM  Result Value Ref Range   Sodium 140 135 - 145 mmol/L   Potassium 3.8 3.5 - 5.1 mmol/L   Chloride 101 101 - 111 mmol/L   CO2 33 (H) 22 - 32 mmol/L   Glucose, Bld 94 65 - 99 mg/dL   BUN 28 (H) 6 - 20 mg/dL   Creatinine, Ser 1.66 (H) 0.44 - 1.00 mg/dL   Calcium 9.1 8.9 - 10.3 mg/dL   Total Protein 6.4 (L) 6.5 - 8.1 g/dL   Albumin 3.2 (L) 3.5 -  5.0 g/dL   AST 13 (L) 15 - 41 U/L   ALT 14 14 - 54 U/L   Alkaline Phosphatase 74 38 - 126 U/L   Total Bilirubin 0.5 0.3 - 1.2 mg/dL   GFR calc non Af Amer 31 (L) >60 mL/min   GFR calc Af Amer 36 (L) >60 mL/min    Comment: (NOTE) The eGFR has been calculated using the CKD EPI equation. This calculation has not been validated in all clinical situations. eGFR's persistently <60 mL/min signify possible Chronic Kidney Disease.    Anion gap 6 5 - 15  Troponin I     Status: Abnormal   Collection Time: 05/12/16 12:02 PM  Result Value Ref Range   Troponin I 0.05 (HH) <0.03 ng/mL    Comment: CRITICAL RESULT CALLED TO, READ BACK BY AND VERIFIED WITH: LASHLEY F. AT 1304 ON [id number] BY THOMPSON S.   Brain natriuretic peptide     Status: Abnormal   Collection Time: 05/12/16 12:02 PM  Result Value Ref Range   B Natriuretic Peptide  267.0 (H) 0.0 - 100.0 pg/mL  D-dimer, quantitative (not at [facility])     Status: Abnormal   Collection Time: 05/12/16 12:02 PM  Result Value Ref Range   D-Dimer, Quant 1.28 (H) 0.00 - 0.50 ug/mL-FEU    Comment: (NOTE) At the manufacturer cut-off of 0.50 ug/mL FEU, this assay has been documented to exclude PE with a sensitivity and negative predictive value of 97 to 99%.  At this time, this assay has not been approved by the FDA to exclude DVT/VTE. Results should be correlated with clinical presentation.   Troponin I     Status: Abnormal   Collection Time: 05/12/16  4:08 PM  Result Value Ref Range   Troponin I 0.04 (HH) <0.03 ng/mL    Comment: CRITICAL VALUE NOTED.  VALUE IS CONSISTENT WITH PREVIOUSLY REPORTED AND CALLED VALUE.  Glucose, capillary     Status: Abnormal   Collection Time: 05/12/16  9:09 PM  Result Value Ref Range   Glucose-Capillary 134 (H) 65 - 99 mg/dL   Comment 1 Notify RN    Comment 2 Document in Chart   Troponin I     Status: Abnormal   Collection Time: 05/12/16 10:39 PM  Result Value Ref Range   Troponin I 0.04 (HH) <0.03 ng/mL    Comment: CRITICAL VALUE NOTED.  VALUE IS CONSISTENT WITH PREVIOUSLY REPORTED AND CALLED VALUE.  Troponin I     Status: Abnormal   Collection Time: 05/13/16  3:37 AM  Result Value Ref Range   Troponin I 0.04 (HH) <0.03 ng/mL    Comment: CRITICAL VALUE NOTED.  VALUE IS CONSISTENT WITH PREVIOUSLY REPORTED AND CALLED VALUE.  Basic metabolic panel     Status: Abnormal   Collection Time: 05/13/16  3:37 AM  Result Value Ref Range   Sodium 143 135 - 145 mmol/L   Potassium 3.8 3.5 - 5.1 mmol/L   Chloride 101 101 - 111 mmol/L   CO2 35 (H) 22 - 32 mmol/L   Glucose, Bld 145 (H) 65 - 99 mg/dL   BUN 28 (H) 6 - 20 mg/dL   Creatinine, Ser 1.80 (H) 0.44 - 1.00 mg/dL   Calcium 8.7 (L) 8.9 - 10.3 mg/dL   GFR calc non Af Amer 28 (L) >60 mL/min   GFR calc Af Amer 32 (L) >60 mL/min    Comment: (NOTE) The eGFR has been calculated using the CKD EPI  equation. This calculation has not been  validated in all clinical situations. eGFR's persistently <60 mL/min signify possible Chronic Kidney Disease.    Anion gap 7 5 - 15  CBC WITH DIFFERENTIAL     Status: None   Collection Time: 05/13/16  3:37 AM  Result Value Ref Range   WBC 6.5 4.0 - 10.5 K/uL   RBC 4.30 3.87 - 5.11 MIL/uL   Hemoglobin 12.0 12.0 - 15.0 g/dL   HCT 38.7 36.0 - 46.0 %   MCV 90.0 78.0 - 100.0 fL   MCH 27.9 26.0 - 34.0 pg   MCHC 31.0 30.0 - 36.0 g/dL   RDW 14.4 11.5 - 15.5 %   Platelets 196 150 - 400 K/uL   Neutrophils Relative % 67 %   Neutro Abs 4.4 1.7 - 7.7 K/uL   Lymphocytes Relative 21 %   Lymphs Abs 1.4 0.7 - 4.0 K/uL   Monocytes Relative 10 %   Monocytes Absolute 0.6 0.1 - 1.0 K/uL   Eosinophils Relative 2 %   Eosinophils Absolute 0.1 0.0 - 0.7 K/uL   Basophils Relative 0 %   Basophils Absolute 0.0 0.0 - 0.1 K/uL  Magnesium     Status: None   Collection Time: 05/13/16  3:37 AM  Result Value Ref Range   Magnesium 1.9 1.7 - 2.4 mg/dL  Glucose, capillary     Status: Abnormal   Collection Time: 05/13/16  7:24 AM  Result Value Ref Range   Glucose-Capillary 115 (H) 65 - 99 mg/dL    Dg Chest 2 View  Result Date: 05/12/2016 CLINICAL DATA:  Urinary retention.  Lower extremity swelling. EXAM: CHEST  2 VIEW COMPARISON:  None. FINDINGS: The patient has marked cardiomegaly. There is interstitial pulmonary edema. No pneumothorax or pleural effusion. No focal airspace disease. IMPRESSION: Cardiomegaly and mild interstitial edema. Electronically Signed   By: Inge Rise M.D.   On: 05/12/2016 12:33   US Renal  Result Date: 05/12/2016 CLINICAL DATA:  Acute renal insufficiency. EXAM: RENAL / URINARY TRACT ULTRASOUND COMPLETE COMPARISON:  None. FINDINGS: Right Kidney: Length: 10.4 cm. Normal renal cortical thickness and echogenicity without focal lesions or hydronephrosis. No renal calculi. Left Kidney: Length: 12.3 cm. Normal renal cortical thickness and  echogenicity without focal lesions or hydronephrosis. No renal calculi. Bladder: Normal IMPRESSION: Normal renal ultrasound examination. Electronically Signed   By: Marijo Sanes M.D.   On: 05/12/2016 17:01   Nm Pulmonary Perf And Vent  Result Date: 05/12/2016 CLINICAL DATA:  Shortness of breath and cough for 2 weeks. EXAM: NUCLEAR MEDICINE VENTILATION - PERFUSION LUNG SCAN TECHNIQUE: Ventilation images were obtained in multiple projections using inhaled aerosol Tc-26mDTPA. Perfusion images were obtained in multiple projections after intravenous injection of Tc-941mAA. RADIOPHARMACEUTICALS:  Thirty-two mCi Technetium-9973mPA aerosol inhalation and 4.4 mCi Technetium-82m44m IV COMPARISON:  Chest x-ray 11/29/2016 FINDINGS: Ventilation: Deposition of pharmaceutical along the airways. Perfusion: No wedge shaped peripheral perfusion defects to suggest acute pulmonary embolism. IMPRESSION: Low probability for clinically significant pulmonary embolus. Electronically Signed   By: ElizNolon Nations.   On: 05/12/2016 20:17   Us VK[place]ous Img Lower Bilateral  Result Date: 05/12/2016 CLINICAL DATA:  Bilateral lower extremity edema with shortness of breath and cough. EXAM: BILATERAL LOWER EXTREMITY VENOUS DUPLEX ULTRASOUND TECHNIQUE: Doppler venous assessment of the bilateral lower extremity deep venous system was performed, including characterization of spectral flow, compressibility, and phasicity. COMPARISON:  None. FINDINGS: There is complete compressibility of the bilateral common femoral, femoral, and popliteal veins. Doppler analysis demonstrates respiratory phasicity and  augmentation of flow with calf compression. No obvious superficial vein or calf vein thrombosis. IMPRESSION: No evidence of lower extremity DVT. Electronically Signed   By: Marybelle Killings M.D.   On: 05/12/2016 15:40    Review of Systems  Constitutional: Positive for malaise/fatigue. Negative for chills and fever.  HENT: Positive for  congestion.   Respiratory: Positive for shortness of breath. Negative for cough.   Cardiovascular: Positive for leg swelling. Negative for orthopnea.  Gastrointestinal: Negative for abdominal pain, nausea and vomiting.  Genitourinary: Negative for frequency and urgency.  Neurological: Positive for weakness.   Blood pressure (!) 151/68, pulse 69, temperature 97.8 F (36.6 C), temperature source Oral, resp. rate 16, height '5\' 6"'  (1.676 m), weight 133.2 kg (293 lb 10.4 oz), SpO2 96 %. Physical Exam  Constitutional: She is oriented to person, place, and time. No distress.  Eyes: Left eye exhibits no discharge.  Neck: No JVD present.  Cardiovascular: Normal rate and regular rhythm.   Respiratory: No respiratory distress. She has wheezes. She has no rales.  GI: She exhibits no distension. There is no tenderness.  Musculoskeletal: She exhibits edema.  Neurological: She is alert and oriented to person, place, and time.    Assessment/Plan: Problem #1 CHF: Patient with severe cardiomegaly and pulmonary edema. Presently she is on Lasix she has a good urine output. Even though she has proteinuria at this moment it doesn't seem to be nephrotic range. She had about 4300 mL of urine output and feeling better. Problem #2 renal failure: At this moment no sure whether this is acute or chronic. Patient has previous history of acute kidney injury about 2 years ago. According to her renal function has improved. Ultrasound of the kidneys showed right kidney to be 10 .4 and left kidney to be 12.3. Hence patient with unequal kidney. There is no hydronephrosis. This could be secondary to hypertension/diabetes/renal artery stenosis Problem #3 hypertension: Poorly controlled. Patient does no seem to believe in medications and she has been switching on and off to herbal medications. Hence there seems to be an element of noncompliance. She seemed to have cough with ACE inhibitor and also feeling sluggish with beta  blockers. Since patient has unequal kidney at this moment need to rule out renal artery stenosis. 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 Other secondary and primary causes cannot be ruled out. Plan: Agree with Lasix 2] will do 24-hour urine for protein and given her electrophoresis 3] we'll check ANA, complement, hepatitis B surface antigen, hepatitis C antibody, ASO titer 4] will check Anca 5] will check renal panel in the morning  [facility] S 05/13/2016, 8:47 AM

## 2016-05-13 NOTE — Progress Notes (Signed)
*  PRELIMINARY RESULTS* Echocardiogram 2D Echocardiogram has been performed.  Leavy Cella 05/13/2016, 9:59 AM

## 2016-05-13 NOTE — Progress Notes (Signed)
Nutrition Brief Note   Noted new consult for diet education. RD not on site this date. If pt still admitted MOnday, will follow up to provide education in person.   Burtis Junes RD, LDN, CNSC Clinical Nutrition Pager: [phone] 05/13/2016 7:53 AM

## 2016-05-14 LAB — C4 COMPLEMENT: Complement C4, Body Fluid: 19 mg/dL (ref 14–44)

## 2016-05-14 LAB — BASIC METABOLIC PANEL
Anion gap: 7 (ref 5–15)
BUN: 30 mg/dL — AB (ref 6–20)
CALCIUM: 9.1 mg/dL (ref 8.9–10.3)
CO2: 36 mmol/L — ABNORMAL HIGH (ref 22–32)
Chloride: 98 mmol/L — ABNORMAL LOW (ref 101–111)
Creatinine, Ser: 1.87 mg/dL — ABNORMAL HIGH (ref 0.44–1.00)
GFR calc Af Amer: 31 mL/min — ABNORMAL LOW (ref >60)
GFR, EST NON AFRICAN AMERICAN: 27 mL/min — AB (ref >60)
Glucose, Bld: 133 mg/dL — ABNORMAL HIGH (ref 65–99)
POTASSIUM: 4.2 mmol/L (ref 3.5–5.1)
SODIUM: 141 mmol/L (ref 135–145)

## 2016-05-14 LAB — RENAL FUNCTION PANEL
ALBUMIN: 3.1 g/dL — AB (ref 3.5–5.0)
Anion gap: 7 (ref 5–15)
BUN: 30 mg/dL — AB (ref 6–20)
CALCIUM: 9.1 mg/dL (ref 8.9–10.3)
CO2: 37 mmol/L — ABNORMAL HIGH (ref 22–32)
CREATININE: 1.84 mg/dL — AB (ref 0.44–1.00)
Chloride: 99 mmol/L — ABNORMAL LOW (ref 101–111)
GFR calc Af Amer: 32 mL/min — ABNORMAL LOW (ref >60)
GFR, EST NON AFRICAN AMERICAN: 27 mL/min — AB (ref >60)
GLUCOSE: 131 mg/dL — AB (ref 65–99)
PHOSPHORUS: 3.9 mg/dL (ref 2.5–4.6)
Potassium: 4.3 mmol/L (ref 3.5–5.1)
SODIUM: 143 mmol/L (ref 135–145)

## 2016-05-14 LAB — GLUCOSE, CAPILLARY
GLUCOSE-CAPILLARY: 195 mg/dL — AB (ref 65–99)
GLUCOSE-CAPILLARY: 143 mg/dL — AB (ref 65–99)
Glucose-Capillary: 120 mg/dL — ABNORMAL HIGH (ref 65–99)
Glucose-Capillary: 184 mg/dL — ABNORMAL HIGH (ref 65–99)

## 2016-05-14 LAB — MAGNESIUM: Magnesium: 2 mg/dL (ref 1.7–2.4)

## 2016-05-14 LAB — CBC WITH DIFFERENTIAL/PLATELET
BASOS ABS: 0 10*3/uL (ref 0.0–0.1)
BASOS PCT: 0 %
EOS ABS: 0.1 10*3/uL (ref 0.0–0.7)
EOS PCT: 2 %
HCT: 41.3 % (ref 36.0–46.0)
Hemoglobin: 12.9 g/dL (ref 12.0–15.0)
LYMPHS PCT: 26 %
Lymphs Abs: 1.5 10*3/uL (ref 0.7–4.0)
MCH: 28.3 pg (ref 26.0–34.0)
MCHC: 31.2 g/dL (ref 30.0–36.0)
MCV: 90.6 fL (ref 78.0–100.0)
Monocytes Absolute: 0.8 10*3/uL (ref 0.1–1.0)
Monocytes Relative: 14 %
Neutro Abs: 3.3 10*3/uL (ref 1.7–7.7)
Neutrophils Relative %: 58 %
PLATELETS: 203 10*3/uL (ref 150–400)
RBC: 4.56 MIL/uL (ref 3.87–5.11)
RDW: 14.2 % (ref 11.5–15.5)
WBC: 5.8 10*3/uL (ref 4.0–10.5)

## 2016-05-14 LAB — HEMOGLOBIN A1C
Hgb A1c MFr Bld: 6.5 % — ABNORMAL HIGH (ref 4.8–5.6)
Mean Plasma Glucose: 140 mg/dL

## 2016-05-14 LAB — C3 COMPLEMENT: C3 Complement: 143 mg/dL (ref 82–167)

## 2016-05-14 LAB — COMPLEMENT, TOTAL: Compl, Total (CH50): >60 U/mL — ABNORMAL HIGH (ref 42–60)

## 2016-05-14 LAB — PROTEIN, URINE, 24 HOUR
Collection Interval-UPROT: 24 h
Protein, 24H Urine: 8104 mg/d — ABNORMAL HIGH (ref 50–100)
Protein, Urine: 139 mg/dL
Urine Total Volume-UPROT: 5830 mL

## 2016-05-14 LAB — HEPATITIS B SURFACE ANTIGEN: HEP B S AG: NEGATIVE

## 2016-05-14 LAB — HEPATITIS C ANTIBODY: HCV Ab: 0.1 {s_co_ratio} (ref 0.0–0.9)

## 2016-05-14 MED ORDER — TORSEMIDE 20 MG PO TABS
40.0000 mg | ORAL_TABLET | Freq: Every day | ORAL | Status: DC
  Administered 2016-05-14 – 2016-05-16 (×3): 40 mg via ORAL
  Filled 2016-05-14 (×3): qty 2

## 2016-05-14 NOTE — Progress Notes (Signed)
PROGRESS NOTE    Karelly Dewalt  IHK:[id number]  DOB: November 12, 1950  DOA: 05/12/2016 PCP: Wende Neighbors, MD  Hospital course: Camala Talwar is a 66 y.o. female with past medical history of hypertension, no known history of heart failure who was sent to the emergency department after being seen by the PCP earlier today. There was concerned with worsening edema in the lower extremities.  She was admitted with CHF exacerbation and renal insufficiency.   Assessment & Plan:   1. Acute exacerbation of CHF - new diagnosis - given cardiomegaly, elevated BNP, poorly controlled hypertension I suspect this is congestive heart failure. The patient will need an echocardiogram to evaluate heart function and valves. Echo with systolic function lower limit of normal but grade 2 DD.  Cardiology consult requested.  Nephrology team started patient on demadex 2/4.  Monitor intake and output, daily weights. Monitor electrolytes closely. PE and DVT ruled out.  2. Hypertensive urgency-improved now, on coreg BID, IV hydralazine ordered. IV lasix discontinued, now on demadex.   3. Anasarca - secondary to profound volume overload, diuresing as noted above and follow closely. Discontinue amlodipine. 4. Proteinuria-I'm concerned about diabetic nephropathy. 24 hour urine collection in process for nephrology team evaluation. Monitor closely.  5. Acute on chronic renal insufficiency-unable to obtain any old labs to compare previous renal function tests but I suspect she has a chronic renal insufficiency secondary to medical renal disease including uncontrolled hypertension and diabetes mellitus. Appreciate nephrology consultation. 6. Mobility-patient reports being immobile for last 3 months.  BLE dopplers and VQ scan negative for VTE. 7. Diabetes mellitus with renal complications-resume basal insulin, reduced dose given renal insufficiency, monitor blood glucose with supplemental scale coverage as needed. A1c 6.5%. 8. Order placed to  try and obtain old records and labs.    DVT Prophylaxis: hep Code Status: full  Family Communication: bedside  Disposition Plan: TBD   Consultants:  Cardiology  nephrology  Subjective: Pt is starting to feel better with diuresis.  She has lost over 11 pounds since admission.    Objective: Vitals:   05/13/16 0404 05/13/16 1526 05/13/16 2132 05/14/16 0634  BP: (!) 151/68 134/66 (!) 165/79 (!) 151/59  Pulse: 69 74 67 66  Resp: 16 16 16 16   Temp: 97.8 F (36.6 C) 98.2 F (36.8 C) 98.6 F (37 C) 98.1 F (36.7 C)  TempSrc: Oral Oral Oral Oral  SpO2: 96% (!) 85% 97% 96%  Weight: 133.2 kg (293 lb 10.4 oz)   131.4 kg (289 lb 11 oz)  Height:        Intake/Output Summary (Last 24 hours) at 05/14/16 1049 Last data filed at 05/13/16 2230  Gross per 24 hour  Intake              480 ml  Output             2850 ml  Net            -2370 ml   Filed Weights   05/12/16 2012 05/13/16 0404 05/14/16 0634  Weight: 133.3 kg (293 lb 14 oz) 133.2 kg (293 lb 10.4 oz) 131.4 kg (289 lb 11 oz)    Exam:  General exam: obese female, lying comfortably supine on the gurney in no obvious distress.  Respiratory system:  No increased work of breathing. Cardiovascular system: S1 & S2 heard.  Gastrointestinal system: Abdomen is slightly less edematous, nondistended, soft and nontender. Normal bowel sounds heard. Central nervous system: Alert and oriented. No focal neurological deficits.  Extremities: 2+ edema bilateral LEs.  Data Reviewed: Basic Metabolic Panel:  Recent Labs Lab 05/12/16 1202 05/13/16 0337 05/14/16 0540  NA 140 143 143  141  K 3.8 3.8 4.3  4.2  CL 101 101 99*  98*  CO2 33* 35* 37*  36*  GLUCOSE 94 145* 131*  133*  BUN 28* 28* 30*  30*  CREATININE 1.66* 1.80* 1.84*  1.87*  CALCIUM 9.1 8.7* 9.1  9.1  MG  --  1.9 2.0  PHOS  --   --  3.9   Liver Function Tests:  Recent Labs Lab 05/12/16 1202 05/14/16 0540  AST 13*  --   ALT 14  --   ALKPHOS 74  --     BILITOT 0.5  --   PROT 6.4*  --   ALBUMIN 3.2* 3.1*   No results for input(s): LIPASE, AMYLASE in the last 168 hours. No results for input(s): AMMONIA in the last 168 hours. CBC:  Recent Labs Lab 05/12/16 1202 05/13/16 0337 05/14/16 0540  WBC 6.7 6.5 5.8  NEUTROABS  --  4.4 3.3  HGB 12.6 12.0 12.9  HCT 40.9 38.7 41.3  MCV 90.1 90.0 90.6  PLT 210 196 203   Cardiac Enzymes:  Recent Labs Lab 05/12/16 1202 05/12/16 1608 05/12/16 2239 05/13/16 0337  TROPONINI 0.05* 0.04* 0.04* 0.04*   CBG (last 3)   Recent Labs  05/13/16 1615 05/13/16 2130 05/14/16 0722  GLUCAP 124* 161* 195*   No results found for this or any previous visit (from the past 240 hour(s)).   Studies: Dg Chest 2 View  Result Date: 05/13/2016 CLINICAL DATA:  CHF.  Leg swelling and shortness of breath. EXAM: CHEST  2 VIEW COMPARISON:  May 12, 2016 FINDINGS: Cardiomegaly and CHF. Scarring or atelectasis in the lateral left lung base. No other interval changes. IMPRESSION: Cardiomegaly and stable mild CHF. Electronically Signed   By: Dorise Bullion III M.D   On: 05/13/2016 12:05   Dg Chest 2 View  Result Date: 05/12/2016 CLINICAL DATA:  Urinary retention.  Lower extremity swelling. EXAM: CHEST  2 VIEW COMPARISON:  None. FINDINGS: The patient has marked cardiomegaly. There is interstitial pulmonary edema. No pneumothorax or pleural effusion. No focal airspace disease. IMPRESSION: Cardiomegaly and mild interstitial edema. Electronically Signed   By: Inge Rise M.D.   On: 05/12/2016 12:33   US Renal  Result Date: 05/12/2016 CLINICAL DATA:  Acute renal insufficiency. EXAM: RENAL / URINARY TRACT ULTRASOUND COMPLETE COMPARISON:  None. FINDINGS: Right Kidney: Length: 10.4 cm. Normal renal cortical thickness and echogenicity without focal lesions or hydronephrosis. No renal calculi. Left Kidney: Length: 12.3 cm. Normal renal cortical thickness and echogenicity without focal lesions or hydronephrosis. No renal  calculi. Bladder: Normal IMPRESSION: Normal renal ultrasound examination. Electronically Signed   By: Marijo Sanes M.D.   On: 05/12/2016 17:01   Nm Pulmonary Perf And Vent  Result Date: 05/12/2016 CLINICAL DATA:  Shortness of breath and cough for 2 weeks. EXAM: NUCLEAR MEDICINE VENTILATION - PERFUSION LUNG SCAN TECHNIQUE: Ventilation images were obtained in multiple projections using inhaled aerosol Tc-64m DTPA. Perfusion images were obtained in multiple projections after intravenous injection of Tc-54m MAA. RADIOPHARMACEUTICALS:  Thirty-two mCi Technetium-55m DTPA aerosol inhalation and 4.4 mCi Technetium-7m MAA IV COMPARISON:  Chest x-ray 11/29/2016 FINDINGS: Ventilation: Deposition of pharmaceutical along the airways. Perfusion: No wedge shaped peripheral perfusion defects to suggest acute pulmonary embolism. IMPRESSION: Low probability for clinically significant pulmonary embolus. Electronically Signed   By: Nolon Nations  M.D.   On: 05/12/2016 20:17   US Venous Img Lower Bilateral  Result Date: 05/12/2016 CLINICAL DATA:  Bilateral lower extremity edema with shortness of breath and cough. EXAM: BILATERAL LOWER EXTREMITY VENOUS DUPLEX ULTRASOUND TECHNIQUE: Doppler venous assessment of the bilateral lower extremity deep venous system was performed, including characterization of spectral flow, compressibility, and phasicity. COMPARISON:  None. FINDINGS: There is complete compressibility of the bilateral common femoral, femoral, and popliteal veins. Doppler analysis demonstrates respiratory phasicity and augmentation of flow with calf compression. No obvious superficial vein or calf vein thrombosis. IMPRESSION: No evidence of lower extremity DVT. Electronically Signed   By: Marybelle Killings M.D.   On: 05/12/2016 15:40   Scheduled Meds: . aspirin EC  81 mg Oral Daily  . carvedilol  3.125 mg Oral BID WC  . heparin  5,000 Units Subcutaneous Q8H  . insulin aspart  0-9 Units Subcutaneous TID WC  . insulin  aspart  3 Units Subcutaneous TID WC  . insulin detemir  10 Units Subcutaneous QHS  . mouth rinse  15 mL Mouth Rinse BID  . potassium chloride SA  40 mEq Oral BID  . sodium chloride flush  3 mL Intravenous Q12H  . torsemide  40 mg Oral Daily   Continuous Infusions:  Principal Problem:   CHF (congestive heart failure) ([facility]) Active Problems:   Hypertensive urgency   Uncontrolled hypertension   DM (diabetes mellitus), type 2 with renal complications ([facility])   anasarca   Immobility   Proteinuria  Time spent:   Irwin Brakeman, MD, FAAFP [facility] Pager [phone]  If 7PM-7AM, please contact night-coverage [email or web address] Password [email or web address] 05/14/2016, 10:49 AM    LOS: 2 days

## 2016-05-14 NOTE — Progress Notes (Signed)
Subjective: Interval History: has no complaint of nausea or vomiting. Patient is feeling much better. She denies any difficulty breathing..  Objective: Vital signs in last 24 hours: Temp:  [98.1 F (36.7 C)-98.6 F (37 C)] 98.1 F (36.7 C) (02/04 0634) Pulse Rate:  [66-74] 66 (02/04 0634) Resp:  [16] 16 (02/04 0634) BP: (134-165)/(59-79) 151/59 (02/04 0634) SpO2:  [85 %-97 %] 96 % (02/04 0634) Weight:  [131.4 kg (289 lb 11 oz)] 131.4 kg (289 lb 11 oz) (02/04 0634) Weight change: -7.99 kg (-17 lb 9.9 oz)  Intake/Output from previous day: 02/03 0701 - 02/04 0700 In: 720 [P.O.:720] Out: 2850 [Urine:2850] Intake/Output this shift: No intake/output data recorded.  General appearance: alert, cooperative and no distress Resp: diminished breath sounds posterior - bilateral Cardio: regular rate and rhythm Extremities: edema 2+ edema  Lab Results:  Recent Labs  05/13/16 0337 05/14/16 0540  WBC 6.5 5.8  HGB 12.0 12.9  HCT 38.7 41.3  PLT 196 203   BMET:  Recent Labs  05/13/16 0337 05/14/16 0540  NA 143 143  141  K 3.8 4.3  4.2  CL 101 99*  98*  CO2 35* 37*  36*  GLUCOSE 145* 131*  133*  BUN 28* 30*  30*  CREATININE 1.80* 1.84*  1.87*  CALCIUM 8.7* 9.1  9.1   No results for input(s): PTH in the last 72 hours. Iron Studies: No results for input(s): IRON, TIBC, TRANSFERRIN, FERRITIN in the last 72 hours.  Studies/Results: Dg Chest 2 View  Result Date: 05/13/2016 CLINICAL DATA:  CHF.  Leg swelling and shortness of breath. EXAM: CHEST  2 VIEW COMPARISON:  May 12, 2016 FINDINGS: Cardiomegaly and CHF. Scarring or atelectasis in the lateral left lung base. No other interval changes. IMPRESSION: Cardiomegaly and stable mild CHF. Electronically Signed   By: Dorise Bullion III M.D   On: 05/13/2016 12:05   Dg Chest 2 View  Result Date: 05/12/2016 CLINICAL DATA:  Urinary retention.  Lower extremity swelling. EXAM: CHEST  2 VIEW COMPARISON:  None. FINDINGS: The  patient has marked cardiomegaly. There is interstitial pulmonary edema. No pneumothorax or pleural effusion. No focal airspace disease. IMPRESSION: Cardiomegaly and mild interstitial edema. Electronically Signed   By: Inge Rise M.D.   On: 05/12/2016 12:33   US Renal  Result Date: 05/12/2016 CLINICAL DATA:  Acute renal insufficiency. EXAM: RENAL / URINARY TRACT ULTRASOUND COMPLETE COMPARISON:  None. FINDINGS: Right Kidney: Length: 10.4 cm. Normal renal cortical thickness and echogenicity without focal lesions or hydronephrosis. No renal calculi. Left Kidney: Length: 12.3 cm. Normal renal cortical thickness and echogenicity without focal lesions or hydronephrosis. No renal calculi. Bladder: Normal IMPRESSION: Normal renal ultrasound examination. Electronically Signed   By: Marijo Sanes M.D.   On: 05/12/2016 17:01   Nm Pulmonary Perf And Vent  Result Date: 05/12/2016 CLINICAL DATA:  Shortness of breath and cough for 2 weeks. EXAM: NUCLEAR MEDICINE VENTILATION - PERFUSION LUNG SCAN TECHNIQUE: Ventilation images were obtained in multiple projections using inhaled aerosol Tc-59m DTPA. Perfusion images were obtained in multiple projections after intravenous injection of Tc-87m MAA. RADIOPHARMACEUTICALS:  Thirty-two mCi Technetium-32m DTPA aerosol inhalation and 4.4 mCi Technetium-10m MAA IV COMPARISON:  Chest x-ray 11/29/2016 FINDINGS: Ventilation: Deposition of pharmaceutical along the airways. Perfusion: No wedge shaped peripheral perfusion defects to suggest acute pulmonary embolism. IMPRESSION: Low probability for clinically significant pulmonary embolus. Electronically Signed   By: Nolon Nations M.D.   On: 05/12/2016 20:17   US Venous Img Lower Bilateral  Result Date: 05/12/2016 CLINICAL DATA:  Bilateral lower extremity edema with shortness of breath and cough. EXAM: BILATERAL LOWER EXTREMITY VENOUS DUPLEX ULTRASOUND TECHNIQUE: Doppler venous assessment of the bilateral lower extremity deep venous  system was performed, including characterization of spectral flow, compressibility, and phasicity. COMPARISON:  None. FINDINGS: There is complete compressibility of the bilateral common femoral, femoral, and popliteal veins. Doppler analysis demonstrates respiratory phasicity and augmentation of flow with calf compression. No obvious superficial vein or calf vein thrombosis. IMPRESSION: No evidence of lower extremity DVT. Electronically Signed   By: Marybelle Killings M.D.   On: 05/12/2016 15:40    Scheduled: . aspirin EC  81 mg Oral Daily  . carvedilol  3.125 mg Oral BID WC  . furosemide  40 mg Intravenous Q8H  . heparin  5,000 Units Subcutaneous Q8H  . insulin aspart  0-9 Units Subcutaneous TID WC  . insulin aspart  3 Units Subcutaneous TID WC  . insulin detemir  10 Units Subcutaneous QHS  . mouth rinse  15 mL Mouth Rinse BID  . potassium chloride SA  40 mEq Oral BID  . sodium chloride flush  3 mL Intravenous Q12H    Assessment/Plan: Problem #1 CHF: Presently she is on Lasix IV. Her urine output is good she is about 2800 mL of urine output. Patient presently denies any difficulty breathing. Patient however was significant edema. Problem #2 renal failure possibly chronic. Her renal function at this moment seems to be further declining. Most likely from fluid removal. Problem #3 history of hypertension: Her blood pressure is reasonably controlled Problem #4 history of diabetes Problem #5 obesity Problem #6 history of chronic kidney: Need to rule out renal artery stenosis Problem #7 proteinuria Plan: We'll DC IV Lasix We'll start patient on Demadex 40 mg once a day We'll check her renal panel in the morning.    LOS: 2 days   Alzada Brazee S 05/14/2016,9:56 AM

## 2016-05-15 DIAGNOSIS — I5030 Unspecified diastolic (congestive) heart failure: Secondary | ICD-10-CM

## 2016-05-15 DIAGNOSIS — N049 Nephrotic syndrome with unspecified morphologic changes: Secondary | ICD-10-CM

## 2016-05-15 LAB — GLUCOSE, CAPILLARY
GLUCOSE-CAPILLARY: 181 mg/dL — AB (ref 65–99)
Glucose-Capillary: 136 mg/dL — ABNORMAL HIGH (ref 65–99)
Glucose-Capillary: 190 mg/dL — ABNORMAL HIGH (ref 65–99)
Glucose-Capillary: 145 mg/dL — ABNORMAL HIGH (ref 65–99)

## 2016-05-15 LAB — ANTINUCLEAR ANTIBODIES, IFA: ANTINUCLEAR ANTIBODIES, IFA: NEGATIVE

## 2016-05-15 LAB — RENAL FUNCTION PANEL
ALBUMIN: 3.1 g/dL — AB (ref 3.5–5.0)
Anion gap: 8 (ref 5–15)
BUN: 35 mg/dL — AB (ref 6–20)
CALCIUM: 9 mg/dL (ref 8.9–10.3)
CO2: 34 mmol/L — AB (ref 22–32)
Chloride: 98 mmol/L — ABNORMAL LOW (ref 101–111)
Creatinine, Ser: 2.02 mg/dL — ABNORMAL HIGH (ref 0.44–1.00)
GFR calc Af Amer: 28 mL/min — ABNORMAL LOW (ref >60)
GFR calc non Af Amer: 24 mL/min — ABNORMAL LOW (ref >60)
GLUCOSE: 162 mg/dL — AB (ref 65–99)
PHOSPHORUS: 4.1 mg/dL (ref 2.5–4.6)
Potassium: 4.5 mmol/L (ref 3.5–5.1)
SODIUM: 140 mmol/L (ref 135–145)

## 2016-05-15 LAB — BASIC METABOLIC PANEL
Anion gap: 7 (ref 5–15)
BUN: 35 mg/dL — AB (ref 6–20)
CALCIUM: 8.9 mg/dL (ref 8.9–10.3)
CO2: 34 mmol/L — ABNORMAL HIGH (ref 22–32)
CREATININE: 1.95 mg/dL — AB (ref 0.44–1.00)
Chloride: 99 mmol/L — ABNORMAL LOW (ref 101–111)
GFR calc non Af Amer: 25 mL/min — ABNORMAL LOW (ref >60)
GFR, EST AFRICAN AMERICAN: 29 mL/min — AB (ref >60)
Glucose, Bld: 160 mg/dL — ABNORMAL HIGH (ref 65–99)
Potassium: 4.5 mmol/L (ref 3.5–5.1)
SODIUM: 140 mmol/L (ref 135–145)

## 2016-05-15 LAB — MPO/PR-3 (ANCA) ANTIBODIES

## 2016-05-15 LAB — VITAMIN D 25 HYDROXY (VIT D DEFICIENCY, FRACTURES): VIT D 25 HYDROXY: 31.2 ng/mL (ref 30.0–100.0)

## 2016-05-15 NOTE — Progress Notes (Addendum)
Subjective: Interval History: Patient complains of some weakness otherwise feels okay. No difficulty breathing.  Objective: Vital signs in last 24 hours: Temp:  [97.8 F (36.6 C)-98.4 F (36.9 C)] 98.4 F (36.9 C) (02/05 0530) Pulse Rate:  [60-71] 60 (02/05 0813) Resp:  [18-20] 20 (02/05 0530) BP: (137-158)/(66-89) 147/66 (02/05 0813) SpO2:  [95 %-97 %] 97 % (02/05 0530) Weight:  [129.2 kg (284 lb 13.4 oz)] 129.2 kg (284 lb 13.4 oz) (02/05 0530) Weight change: -2.2 kg (-4 lb 13.6 oz)  Intake/Output from previous day: 02/04 0701 - 02/05 0700 In: 720 [P.O.:720] Out: 1600 [Urine:1600] Intake/Output this shift: No intake/output data recorded.  General appearance: alert, cooperative and no distress Resp: diminished breath sounds posterior - bilateral Cardio: regular rate and rhythm Extremities: edema 2+ edema  Lab Results:  Recent Labs  05/13/16 0337 05/14/16 0540  WBC 6.5 5.8  HGB 12.0 12.9  HCT 38.7 41.3  PLT 196 203   BMET:   Recent Labs  05/14/16 0540 05/15/16 0424  NA 143  141 140  140  K 4.3  4.2 4.5  4.5  CL 99*  98* 99*  98*  CO2 37*  36* 34*  34*  GLUCOSE 131*  133* 160*  162*  BUN 30*  30* 35*  35*  CREATININE 1.84*  1.87* 1.95*  2.02*  CALCIUM 9.1  9.1 8.9  9.0   No results for input(s): PTH in the last 72 hours. Iron Studies: No results for input(s): IRON, TIBC, TRANSFERRIN, FERRITIN in the last 72 hours.  Studies/Results: Dg Chest 2 View  Result Date: 05/13/2016 CLINICAL DATA:  CHF.  Leg swelling and shortness of breath. EXAM: CHEST  2 VIEW COMPARISON:  May 12, 2016 FINDINGS: Cardiomegaly and CHF. Scarring or atelectasis in the lateral left lung base. No other interval changes. IMPRESSION: Cardiomegaly and stable mild CHF. Electronically Signed   By: Dorise Bullion III M.D   On: 05/13/2016 12:05    Scheduled: . aspirin EC  81 mg Oral Daily  . carvedilol  3.125 mg Oral BID WC  . heparin  5,000 Units Subcutaneous Q8H  .  insulin aspart  0-9 Units Subcutaneous TID WC  . insulin aspart  3 Units Subcutaneous TID WC  . insulin detemir  10 Units Subcutaneous QHS  . mouth rinse  15 mL Mouth Rinse BID  . potassium chloride SA  40 mEq Oral BID  . sodium chloride flush  3 mL Intravenous Q12H  . torsemide  40 mg Oral Daily    Assessment/Plan: Problem #1 CHF: Patient is on Demadex 40 mg once a day. She has 1600 mL of urine output. Patient has lost about 10 kg since her admission. Still she seems to have some swelling. Problem #2 renal failure possibly chronic. Her renal function continued to decline. Patient is asymptomatic. At this moment seems to be related to her fluid removal. Baseline creatinine is not clear. Problem #3 history of hypertension: Her blood pressure is reasonably controlled Problem #4 history of diabetes: Reasonably controlled. Problem #5 obesity Problem #6 history of unequal kidney: Need to rule out renal artery stenosis Problem #7 proteinuria: Patient with 8 g of protein. Hence nephrotic range. Hepatitis B surface antigen is negative, complements normal, hepatitis C antibody is negative. The etiology for her proteinuria seems to be most likely from her diabetes. 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 Patient presently is not on ACE inhibitor. Patient seems to have reaction to ACE mainly cough. However  once her renal function stabilized she may benefit from Cozaar. Plan: We'll continue with Demadex Patient advised to decrease her salt and fluid intake We'll check her renal panel in the morning.    LOS: 3 days   Tylan Kinn S 05/15/2016,10:11 AM

## 2016-05-15 NOTE — Progress Notes (Signed)
PROGRESS NOTE    Natalie Friedman  HQI:[id number]  DOB: 10-Nov-1950  DOA: 05/12/2016 PCP: Wende Neighbors, MD  Hospital course: Natalie Friedman is a 66 y.o. female with past medical history of hypertension, no known history of heart failure who was sent to the emergency department after being seen by the PCP earlier today. There was concerned with worsening edema in the lower extremities.  She was admitted with CHF exacerbation and renal insufficiency.   Assessment & Plan:   1. Acute volume overload from nephrotic syndrome - new diagnosis - cardiology evaluated and don't feel like this is CHF, elevated BNP was mild,  Echo with systolic function lower limit of normal but grade 2 DD.  Cardiology consult requested.  Nephrology team started patient on demadex 2/4.  Monitor intake and output, daily weights. Monitor electrolytes closely. PE and DVT ruled out.  2. Hypertensive urgency-improved now, on coreg BID, IV hydralazine ordered. IV lasix discontinued, now on demadex.   3. Anasarca - secondary to profound volume overload, diuresing as noted above and follow closely. Discontinue amlodipine. 4. 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 24 hour urine collection with 8 gm of protein seen.    5. Acute on chronic renal insufficiency-unable to obtain any old labs to compare previous renal function tests but I suspect she has a chronic renal insufficiency secondary to medical renal disease including uncontrolled hypertension and diabetes mellitus. Appreciate nephrology consultation. 6. Mobility-patient reports being immobile for last 3 months.  BLE dopplers and VQ scan negative for VTE.  PT recommending outpatient PT (pt already has been doing this and thus will continue at discharge).  7. Diabetes mellitus with renal complications-resume basal insulin, reduced dose given renal insufficiency, monitor blood glucose with supplemental scale coverage as needed. A1c 6.5%. CBG (last 3)   Recent  Labs  05/14/16 2127 05/15/16 0756 05/15/16 1159  GLUCAP 184* 136* 190*     DVT Prophylaxis: hep Code Status: full  Family Communication: bedside  Disposition Plan: TBD   Consultants:  Cardiology  nephrology  Subjective: Pt is starting to feel better with diuresis.  She has lost over 11 pounds since admission.    Objective: Vitals:   05/14/16 1300 05/14/16 2119 05/15/16 0530 05/15/16 0813  BP: (!) 158/69 (!) 143/75 137/89 (!) 147/66  Pulse: 70 71 70 60  Resp: 18 18 20    Temp: 97.8 F (36.6 C) 98.2 F (36.8 C) 98.4 F (36.9 C)   TempSrc: Oral Oral Oral   SpO2: 95% 95% 97%   Weight:   129.2 kg (284 lb 13.4 oz)   Height:        Intake/Output Summary (Last 24 hours) at 05/15/16 1258 Last data filed at 05/15/16 0537  Gross per 24 hour  Intake              240 ml  Output             1600 ml  Net            -1360 ml   Filed Weights   05/13/16 0404 05/14/16 0634 05/15/16 0530  Weight: 133.2 kg (293 lb 10.4 oz) 131.4 kg (289 lb 11 oz) 129.2 kg (284 lb 13.4 oz)    Exam:  General exam: obese female, lying comfortably supine on the gurney in no obvious distress.  Respiratory system:  No increased work of breathing. Cardiovascular system: S1 & S2 heard.  Gastrointestinal system: Abdomen is slightly less edematous, nondistended, soft and nontender. Normal bowel sounds heard.  Central nervous system: Alert and oriented. No focal neurological deficits. Extremities: 2+ edema bilateral LEs.  Data Reviewed: Basic Metabolic Panel:  Recent Labs Lab 05/12/16 1202 05/13/16 0337 05/14/16 0540 05/15/16 0424  NA 140 143 143  141 140  140  K 3.8 3.8 4.3  4.2 4.5  4.5  CL 101 101 99*  98* 99*  98*  CO2 33* 35* 37*  36* 34*  34*  GLUCOSE 94 145* 131*  133* 160*  162*  BUN 28* 28* 30*  30* 35*  35*  CREATININE 1.66* 1.80* 1.84*  1.87* 1.95*  2.02*  CALCIUM 9.1 8.7* 9.1  9.1 8.9  9.0  MG  --  1.9 2.0  --   PHOS  --   --  3.9 4.1   Liver Function  Tests:  Recent Labs Lab 05/12/16 1202 05/14/16 0540 05/15/16 0424  AST 13*  --   --   ALT 14  --   --   ALKPHOS 74  --   --   BILITOT 0.5  --   --   PROT 6.4*  --   --   ALBUMIN 3.2* 3.1* 3.1*   No results for input(s): LIPASE, AMYLASE in the last 168 hours. No results for input(s): AMMONIA in the last 168 hours. CBC:  Recent Labs Lab 05/12/16 1202 05/13/16 0337 05/14/16 0540  WBC 6.7 6.5 5.8  NEUTROABS  --  4.4 3.3  HGB 12.6 12.0 12.9  HCT 40.9 38.7 41.3  MCV 90.1 90.0 90.6  PLT 210 196 203   Cardiac Enzymes:  Recent Labs Lab 05/12/16 1202 05/12/16 1608 05/12/16 2239 05/13/16 0337  TROPONINI 0.05* 0.04* 0.04* 0.04*   CBG (last 3)   Recent Labs  05/14/16 2127 05/15/16 0756 05/15/16 1159  GLUCAP 184* 136* 190*   No results found for this or any previous visit (from the past 240 hour(s)).   Studies: No results found. Scheduled Meds: . aspirin EC  81 mg Oral Daily  . carvedilol  3.125 mg Oral BID WC  . heparin  5,000 Units Subcutaneous Q8H  . insulin aspart  0-9 Units Subcutaneous TID WC  . insulin aspart  3 Units Subcutaneous TID WC  . insulin detemir  10 Units Subcutaneous QHS  . mouth rinse  15 mL Mouth Rinse BID  . potassium chloride SA  40 mEq Oral BID  . sodium chloride flush  3 mL Intravenous Q12H  . torsemide  40 mg Oral Daily   Continuous Infusions:  Principal Problem:   CHF (congestive heart failure) (HCC) Active Problems:   Hypertensive urgency   Uncontrolled hypertension   DM (diabetes mellitus), type 2 with renal complications (HCC)   anasarca   Immobility   Proteinuria  Time spent:   Irwin Brakeman, MD, FAAFP [facility] Pager [phone]  If 7PM-7AM, please contact night-coverage [email or web address] Password [email or web address] 05/15/2016, 12:58 PM    LOS: 3 days

## 2016-05-15 NOTE — Evaluation (Signed)
Physical Therapy Evaluation Patient Details Name: Natalie Friedman MRN: [id number] DOB: 1950/04/18 Today's Date: 05/15/2016   History of Present Illness  66 y.o. female with past medical history of hypertension, no known history of heart failure who was sent to the emergency department after being seen by the PCP earlier today. There was concerned with worsening edema in the lower extremities.  She was admitted with CHF exacerbation and renal insufficiency.   Clinical Impression  Pt received in bed, husband present, but stepped out for mobility assessment.  Pt normally uses a cane for ambulation, and requires some assistance for dressing - socks and shoes.  She is usually independent for bathing using the shower chair, however occasionally requires supervision.  During PT evaluation, she was modified independent for sit<>stand with RW, and was able to ambulate with min guard and RW x 142ft.  Further gait distance limited due to fatigue.  Pt is recommended to continue with OPPT upon d/c - she already is a patient at [facility] hand rehabilitation by Dr. Ruthe Mannan office and would like to continue there.  She may need a RW at d/c.      Follow Up Recommendations Outpatient PT (Pt already going to [facility] by Dr. Ruthe Mannan office.  She would like to continue there. )    Equipment Recommendations  Rolling walker with 5" wheels    Recommendations for Other Services       Precautions / Restrictions Precautions Precautions: None Restrictions Weight Bearing Restrictions: No      Mobility  Bed Mobility Overal bed mobility: Modified Independent                Transfers Overall transfer level: Modified independent Equipment used: Rolling walker (2 wheeled) Transfers: Sit to/from Stand Sit to Stand: Modified independent (Device/Increase time)            Ambulation/Gait Ambulation/Gait assistance: Min guard Ambulation Distance (Feet): 110 Feet Assistive device:  Rolling walker (2 wheeled) Gait Pattern/deviations: Step-through pattern;Antalgic   Gait velocity interpretation: <1.8 ft/sec, indicative of risk for recurrent falls General Gait Details: Increased toe out positioning on the right.  Noted antalgic gait pattern, and pt required cues to keep body inside the base of the RW.   Stairs            Wheelchair Mobility    Modified Rankin (Stroke Patients Only)       Balance Overall balance assessment: Needs assistance Sitting-balance support: Bilateral upper extremity supported;Feet supported Sitting balance-Leahy Scale: Good     Standing balance support: Bilateral upper extremity supported Standing balance-Leahy Scale: Fair Standing balance comment: UE's supported on the RW.                              Pertinent Vitals/Pain Pain Assessment: 0-10 Pain Score: 4  Pain Location: R knee, back pain Pain Descriptors / Indicators: Constant Pain Intervention(s): Limited activity within patient's tolerance;Monitored during session;Repositioned    Home Living   Living Arrangements: Spouse/significant other Available Help at Discharge: Family;Available 24 hours/day Type of Home: House Home Access: Stairs to enter   [employer] of Steps: 2 steps  Home Layout: Able to live on main level with bedroom/bathroom;Two level Home Equipment: Cane - single point;Walker - 4 wheels;Tub bench      Prior Function Level of Independence: Independent with assistive device(s)   Gait / Transfers Assistance Needed: Pt has been ambulating with a cane since November.  Pt states that  she mostly mobilizes at home, but does get out to doctors appointments and to PT at [facility] rehabilitation specialists.    ADL's / Homemaking Assistance Needed: assistance with dressing for socks and shoes only, and occasional supervision for bathing.  Not driving since April - states that stopping is the issues and cannot lift the R LE to switch  pedal.        Hand Dominance   Dominant Hand: Right    Extremity/Trunk Assessment   Upper Extremity Assessment Upper Extremity Assessment: Overall WFL for tasks assessed    Lower Extremity Assessment Lower Extremity Assessment: Generalized weakness       Communication   Communication: No difficulties  Cognition Arousal/Alertness: Awake/alert Behavior During Therapy: WFL for tasks assessed/performed Overall Cognitive Status: Within Functional Limits for tasks assessed                      General Comments General comments (skin integrity, edema, etc.): Noted B LE edema and R anterior tibial area is red compared to the L.  Pt states that her LE's were weeping at one point.  Also noted dry scaling skin on both LE's.     Exercises     Assessment/Plan    PT Assessment Patient needs continued PT services  PT Problem List Decreased strength;Decreased activity tolerance;Decreased balance;Decreased mobility;Cardiopulmonary status limiting activity;Decreased skin integrity;Obesity          PT Treatment Interventions DME instruction;Gait training;Functional mobility training;Therapeutic activities;Balance training;Patient/family education    PT Goals (Current goals can be found in the Care Plan section)  Acute Rehab PT Goals Patient Stated Goal: Pt wants to go home and continue her OPPT PT Goal Formulation: With patient Time For Goal Achievement: 05/22/16 Potential to Achieve Goals: Good    Frequency Min 2X/week   Barriers to discharge        Co-evaluation               End of Session Equipment Utilized During Treatment: Gait belt Activity Tolerance: Patient tolerated treatment well Patient left: in chair;with call bell/phone within reach Nurse Communication: Mobility status (Val, RN notified of pt's mobility, and mobility sheet left hanging in the room.)    Functional Assessment Tool Used: [employer] AM-PAC "6-clicks"  Functional Limitation:  Mobility: Walking and moving around Mobility: Walking and Moving Around Current Status [phone]): At least 20 percent but less than 40 percent impaired, limited or restricted Mobility: Walking and Moving Around Goal Status [phone]): At least 1 percent but less than 20 percent impaired, limited or restricted    Time: 8413-2440 PT Time Calculation (min) (ACUTE ONLY): 27 min   Charges:   PT Evaluation $PT Eval Low Complexity: 1 Procedure PT Treatments $Gait Training: 8-22 mins   PT G Codes:   PT G-Codes **NOT FOR INPATIENT CLASS** Functional Assessment Tool Used: [employer] AM-PAC "6-clicks"  Functional Limitation: Mobility: Walking and moving around Mobility: Walking and Moving Around Current Status [phone]): At least 20 percent but less than 40 percent impaired, limited or restricted Mobility: Walking and Moving Around Goal Status [phone]): At least 1 percent but less than 20 percent impaired, limited or restricted    Beth Jasleen Riepe, PT, DPT X: [phone]

## 2016-05-15 NOTE — Consult Note (Signed)
CARDIOLOGY CONSULT NOTE       Patient ID: Natalie Friedman MRN: [id number] DOB/AGE: Nov 11, 1950 66 y.o.  Admit date: 05/12/2016 Referring Physician: Wynetta Emery Primary Physician: Wende Neighbors, MD Primary Cardiologist:  [place]  Reason for Consultation: CHF  Principal Problem:   CHF (congestive heart failure) ([place]) Active Problems:   Uncontrolled hypertension   DM (diabetes mellitus), type 2 with renal complications ([place])   Hypertensive urgency   anasarca   Immobility   Proteinuria   HPI:  66 y.o. admitted with increasing LE edema. History of HTN.  Previous renal insuficiency That improved. Admitted with Cr 2.02  But nephrotic range proteinuria over 8 grams. No chest pain. Indicates strict low carb diet but has cheese puffs on tray.  Reviewed her echo and EF  Low normal with no valve disease. Her edema started a few weeks ago after getting steroids For bronchitis.  This am in no distress Slept well No palpitations, chest pain or syncope  ROS All other systems reviewed and negative except as noted above  Past Medical History:  Diagnosis Date  . Diabetes mellitus without complication ([place])   . Hypertension   . Occult spina bifida     History reviewed. No pertinent family history.  Social History   Social History  . Marital status: Unknown    Spouse name: N/A  . Number of children: N/A  . Years of education: N/A   Occupational History  . Not on file.   Social History Main Topics  . Smoking status: Never Smoker  . Smokeless tobacco: Never Used  . Alcohol use No  . Drug use: No  . Sexual activity: Not on file   Other Topics Concern  . Not on file   Social History Narrative  . No narrative on file    Past Surgical History:  Procedure Laterality Date  . ectopic    . patella fx Right 07/2015     . aspirin EC  81 mg Oral Daily  . carvedilol  3.125 mg Oral BID WC  . heparin  5,000 Units Subcutaneous Q8H  . insulin aspart  0-9 Units Subcutaneous TID WC  .  insulin aspart  3 Units Subcutaneous TID WC  . insulin detemir  10 Units Subcutaneous QHS  . mouth rinse  15 mL Mouth Rinse BID  . potassium chloride SA  40 mEq Oral BID  . sodium chloride flush  3 mL Intravenous Q12H  . torsemide  40 mg Oral Daily     Physical Exam: Blood pressure 137/89, pulse 70, temperature 98.4 F (36.9 C), temperature source Oral, resp. rate 20, height 5\' 6"  (1.676 m), weight 284 lb 13.4 oz (129.2 kg), SpO2 97 %.   Affect appropriate Chronically ill unkept obese female  HEENT: normal Neck supple with no adenopathy JVP normal no bruits no thyromegaly Lungs clear with no wheezing and good diaphragmatic motion Heart:  S1/S2 no murmur, no rub, gallop or click PMI normal Abdomen: benighn, BS positve, no tenderness, no AAA no bruit.  No HSM or HJR Distal pulses intact with no bruits Plus 2-3 bilateral edema with erythema  Neuro non-focal Skin warm and dry No muscular weakness   Labs:   Lab Results  Component Value Date   WBC 5.8 05/14/2016   HGB 12.9 05/14/2016   HCT 41.3 05/14/2016   MCV 90.6 05/14/2016   PLT 203 05/14/2016    Recent Labs Lab 05/12/16 1202  05/15/16 0424  NA 140  < > 140  140  K 3.8  < > 4.5  4.5  CL 101  < > 99*  98*  CO2 33*  < > 34*  34*  BUN 28*  < > 35*  35*  CREATININE 1.66*  < > 1.95*  2.02*  CALCIUM 9.1  < > 8.9  9.0  PROT 6.4*  --   --   BILITOT 0.5  --   --   ALKPHOS 74  --   --   ALT 14  --   --   AST 13*  --   --   GLUCOSE 94  < > 160*  162*  < > = values in this interval not displayed. Lab Results  Component Value Date   TROPONINI 0.04 ([place]) 05/13/2016   No results found for: CHOL No results found for: HDL No results found for: LDLCALC No results found for: TRIG No results found for: CHOLHDL No results found for: LDLDIRECT    Radiology: Dg Chest 2 View  Result Date: 05/13/2016 CLINICAL DATA:  CHF.  Leg swelling and shortness of breath. EXAM: CHEST  2 VIEW COMPARISON:  May 12, 2016  FINDINGS: Cardiomegaly and CHF. Scarring or atelectasis in the lateral left lung base. No other interval changes. IMPRESSION: Cardiomegaly and stable mild CHF. Electronically Signed   By: Dorise Bullion III M.D   On: 05/13/2016 12:05   Dg Chest 2 View  Result Date: 05/12/2016 CLINICAL DATA:  Urinary retention.  Lower extremity swelling. EXAM: CHEST  2 VIEW COMPARISON:  None. FINDINGS: The patient has marked cardiomegaly. There is interstitial pulmonary edema. No pneumothorax or pleural effusion. No focal airspace disease. IMPRESSION: Cardiomegaly and mild interstitial edema. Electronically Signed   By: Inge Rise M.D.   On: 05/12/2016 12:33   US Renal  Result Date: 05/12/2016 CLINICAL DATA:  Acute renal insufficiency. EXAM: RENAL / URINARY TRACT ULTRASOUND COMPLETE COMPARISON:  None. FINDINGS: Right Kidney: Length: 10.4 cm. Normal renal cortical thickness and echogenicity without focal lesions or hydronephrosis. No renal calculi. Left Kidney: Length: 12.3 cm. Normal renal cortical thickness and echogenicity without focal lesions or hydronephrosis. No renal calculi. Bladder: Normal IMPRESSION: Normal renal ultrasound examination. Electronically Signed   By: Marijo Sanes M.D.   On: 05/12/2016 17:01   Nm Pulmonary Perf And Vent  Result Date: 05/12/2016 CLINICAL DATA:  Shortness of breath and cough for 2 weeks. EXAM: NUCLEAR MEDICINE VENTILATION - PERFUSION LUNG SCAN TECHNIQUE: Ventilation images were obtained in multiple projections using inhaled aerosol Tc-66m DTPA. Perfusion images were obtained in multiple projections after intravenous injection of Tc-52m MAA. RADIOPHARMACEUTICALS:  Thirty-two mCi Technetium-103m DTPA aerosol inhalation and 4.4 mCi Technetium-60m MAA IV COMPARISON:  Chest x-ray 11/29/2016 FINDINGS: Ventilation: Deposition of pharmaceutical along the airways. Perfusion: No wedge shaped peripheral perfusion defects to suggest acute pulmonary embolism. IMPRESSION: Low probability for  clinically significant pulmonary embolus. Electronically Signed   By: Nolon Nations M.D.   On: 05/12/2016 20:17   US Venous Img Lower Bilateral  Result Date: 05/12/2016 CLINICAL DATA:  Bilateral lower extremity edema with shortness of breath and cough. EXAM: BILATERAL LOWER EXTREMITY VENOUS DUPLEX ULTRASOUND TECHNIQUE: Doppler venous assessment of the bilateral lower extremity deep venous system was performed, including characterization of spectral flow, compressibility, and phasicity. COMPARISON:  None. FINDINGS: There is complete compressibility of the bilateral common femoral, femoral, and popliteal veins. Doppler analysis demonstrates respiratory phasicity and augmentation of flow with calf compression. No obvious superficial vein or calf vein thrombosis. IMPRESSION: No evidence of lower extremity DVT. Electronically  Signed   By: Marybelle Killings M.D.   On: 05/12/2016 15:40    EKG:  SR low voltage    ASSESSMENT AND PLAN:   Edema:  This appears to be more nephrotic syndrome. Echo with low normal EF and should not account for this edema. Continue diuresis No further cardiac w/u is planned  Renal:  Dr Lowanda Foster has seen [place] no hydronephrosis labs ordered  DM:  Discussed low carb diet.  Target hemoglobin A1c is 6.5 or less.  Continue current medications.   SignedJenkins Rouge 05/15/2016, 7:44 AM

## 2016-05-15 NOTE — Care Management Note (Addendum)
Case Management Note  Patient Details  Name: Natalie Friedman MRN: [id number] Date of Birth: 23-Nov-1950  Subjective/Objective:                  Patient adm with CHF. Ind with ADL's, has a cane PTA. She has previously been receiving PT as OP. PT recommends patient start back with OP PT services. Patient would like to use [facility] again. CM will fax referral once referral form available.   Action/Plan: PT also recommends RW. Romualdo Bolk of [facility] notified and will obtain orders from chart.   Later entry 05/16/2016: CM spoke with [facility] yesterday. Referral faxed this morning. RW delivered to room yesterday. No other CM needs.   Expected Discharge Date:       05/16/2016           Expected Discharge Plan:  Home/Self Care (with OP PT referral)  In-House Referral:  NA  Discharge planning Services  CM Consult  Post Acute Care Choice:    Choice offered to:  Patient  DME Arranged:  [place] rolling DME Agency:  [place]:    [place]:     Status of Service:  Completed, signed off  If discussed at [place] of Stay Meetings, dates discussed:    Additional Comments:  Pricilla Moehle, Chauncey [place], RN 05/15/2016, 2:38 PM

## 2016-05-15 NOTE — Plan of Care (Signed)
Problem: Food- and Nutrition-Related Knowledge Deficit (NB-1.1) Goal: Nutrition education Formal process to instruct or train a patient/client in a skill or to impart knowledge to help patients/clients voluntarily manage or modify food choices and eating behavior to maintain or improve health. Outcome: Adequate for Discharge Nutrition Education Note  RD consulted for nutrition education regarding CHF.  RD provided "Eating Plan for Heart Failure". Reviewed patient's dietary recall. Provided examples on ways to decrease sodium intake in diet. Discouraged intake of processed foods and use of salt shaker. Encouraged fresh fruits and vegetables as well as whole grain sources of carbohydrates to maximize fiber intake.   RD discussed why it is important for patient to adhere to diet recommendations, and emphasized the role of fluids, foods to avoid, and importance of weighing self daily. Teach back method used.  Expect good compliance. Natalie Friedman is very motivated and already avoids using the salt shaker and  processed foods.   Body mass index is 45.97 kg/m. Pt meets criteria for obesity class III based on current BMI.   Current diet order is Heart Healthy/CHO modified, patient is consuming approximately >75% of meals at this time. Labs and medications reviewed. No further nutrition interventions warranted at this time. RD contact information provided. If additional nutrition issues arise, please re-consult RD.   Colman Cater Natalie,RD,CSG,LDN Office: [phone] Pager: [phone]

## 2016-05-16 DIAGNOSIS — N049 Nephrotic syndrome with unspecified morphologic changes: Secondary | ICD-10-CM

## 2016-05-16 DIAGNOSIS — E1121 Type 2 diabetes mellitus with diabetic nephropathy: Secondary | ICD-10-CM

## 2016-05-16 LAB — BASIC METABOLIC PANEL
ANION GAP: 7 (ref 5–15)
BUN: 34 mg/dL — AB (ref 6–20)
CALCIUM: 9.1 mg/dL (ref 8.9–10.3)
CO2: 33 mmol/L — AB (ref 22–32)
Chloride: 101 mmol/L (ref 101–111)
Creatinine, Ser: 1.83 mg/dL — ABNORMAL HIGH (ref 0.44–1.00)
GFR calc Af Amer: 32 mL/min — ABNORMAL LOW (ref >60)
GFR calc non Af Amer: 27 mL/min — ABNORMAL LOW (ref >60)
GLUCOSE: 171 mg/dL — AB (ref 65–99)
Potassium: 4.4 mmol/L (ref 3.5–5.1)
Sodium: 141 mmol/L (ref 135–145)

## 2016-05-16 LAB — GLUCOSE, CAPILLARY
GLUCOSE-CAPILLARY: 118 mg/dL — AB (ref 65–99)
Glucose-Capillary: 157 mg/dL — ABNORMAL HIGH (ref 65–99)

## 2016-05-16 MED ORDER — CARVEDILOL 3.125 MG PO TABS: 3.1250 mg | ORAL_TABLET | Freq: Two times a day (BID) | ORAL | 0 refills | Status: DC

## 2016-05-16 MED ORDER — INSULIN DETEMIR 100 UNIT/ML ~~LOC~~ SOLN: 10.0000 [IU] | Freq: Every day | SUBCUTANEOUS | 11 refills | Status: DC

## 2016-05-16 MED ORDER — LOSARTAN POTASSIUM 50 MG PO TABS
100.0000 mg | ORAL_TABLET | Freq: Every day | ORAL | Status: DC
  Administered 2016-05-16: 100 mg via ORAL
  Filled 2016-05-16: qty 2

## 2016-05-16 MED ORDER — LOSARTAN POTASSIUM 100 MG PO TABS: 100.0000 mg | ORAL_TABLET | Freq: Every day | ORAL | 0 refills | Status: DC

## 2016-05-16 MED ORDER — CEPHALEXIN 500 MG PO CAPS: 500.0000 mg | ORAL_CAPSULE | Freq: Three times a day (TID) | ORAL | 0 refills | Status: AC

## 2016-05-16 MED ORDER — TORSEMIDE 20 MG PO TABS: 40.0000 mg | ORAL_TABLET | Freq: Every day | ORAL | 0 refills | Status: DC

## 2016-05-16 MED ORDER — POTASSIUM CHLORIDE CRYS ER 10 MEQ PO TBCR: 10.0000 meq | EXTENDED_RELEASE_TABLET | Freq: Every day | ORAL | 0 refills | Status: DC

## 2016-05-16 MED ORDER — CEPHALEXIN 500 MG PO CAPS
500.0000 mg | ORAL_CAPSULE | Freq: Three times a day (TID) | ORAL | Status: DC
  Administered 2016-05-16: 500 mg via ORAL
  Filled 2016-05-16: qty 1

## 2016-05-16 MED ORDER — ASPIRIN 81 MG PO TBEC: 81.0000 mg | DELAYED_RELEASE_TABLET | Freq: Every day | ORAL | Status: DC

## 2016-05-16 NOTE — Progress Notes (Signed)
Subjective:  Denies SSCP, palpitations or Dyspnea Concern that RLE red  Objective:  Vitals:   05/15/16 1630 05/15/16 1924 05/15/16 1931 05/16/16 0346  BP: 135/70 (!) 151/60  (!) 152/78  Pulse: 65 68  67  Resp: 20 20  20   Temp: 98.2 F (36.8 C) 98.3 F (36.8 C)  98 F (36.7 C)  TempSrc:  Oral  Oral  SpO2: 91% 90% 91% 92%  Weight:    284 lb 10.2 oz (129.1 kg)  Height:        Intake/Output from previous day:  Intake/Output Summary (Last 24 hours) at 05/16/16 0757 Last data filed at 05/16/16 0346  Gross per 24 hour  Intake              720 ml  Output             3600 ml  Net            -2880 ml    Physical Exam: Affect appropriate Obese whtie female  HEENT: normal Neck supple with no adenopathy JVP normal no bruits no thyromegaly Lungs clear with no wheezing and good diaphragmatic motion Heart:  S1/S2 no murmur, no rub, gallop or click PMI normal Abdomen: benighn, BS positve, no tenderness, no AAA no bruit.  No HSM or HJR Distal pulses intact with no bruits Plus 2 bilateral edema Neuro non-focal Skin cellulitis RLE over dorsum of shin  No muscular weakness   Lab Results: Basic Metabolic Panel:  Recent Labs  05/14/16 0540 05/15/16 0424 05/16/16 0402  NA 143  141 140  140 141  K 4.3  4.2 4.5  4.5 4.4  CL 99*  98* 99*  98* 101  CO2 37*  36* 34*  34* 33*  GLUCOSE 131*  133* 160*  162* 171*  BUN 30*  30* 35*  35* 34*  CREATININE 1.84*  1.87* 1.95*  2.02* 1.83*  CALCIUM 9.1  9.1 8.9  9.0 9.1  MG 2.0  --   --   PHOS 3.9 4.1  --    Liver Function Tests:  Recent Labs  05/14/16 0540 05/15/16 0424  ALBUMIN 3.1* 3.1*   CBC:  Recent Labs  05/14/16 0540  WBC 5.8  NEUTROABS 3.3  HGB 12.9  HCT 41.3  MCV 90.6  PLT 203    Imaging: No results found.  Cardiac Studies:  ECG:  SR low voltage    Telemetry: NSR 05/16/2016   Echo:  EF 50-55%   Medications:   . aspirin EC  81 mg Oral Daily  . carvedilol  3.125 mg Oral BID WC    . heparin  5,000 Units Subcutaneous Q8H  . insulin aspart  0-9 Units Subcutaneous TID WC  . insulin aspart  3 Units Subcutaneous TID WC  . insulin detemir  10 Units Subcutaneous QHS  . mouth rinse  15 mL Mouth Rinse BID  . potassium chloride SA  40 mEq Oral BID  . sodium chloride flush  3 mL Intravenous Q12H  . torsemide  40 mg Oral Daily      Assessment/Plan:  Edema:  Nephrotic range proteinuria, low albumin and CRF. Continue current diuretics Does not need further cardiac evaluation Renal has seen but should follow labs drawn For w/u of nephrotic syndrome  DM:  Discussed low carb diet.  Target hemoglobin A1c is 6.5 or less.  Continue current medications.  Cellulitis:  Consider keflex for RLE erythema  Will sign off   Jenkins Rouge 05/16/2016, 7:57  AM    

## 2016-05-16 NOTE — Progress Notes (Signed)
Patient discharged home with instructions given,on medications,and follow up visits, patient verbalized understanding. Prescriptions sent to Pharmacy of choice documented on AVS. No c/o pain or discomfort noted. Accompanied by staff to an awaiting vehicle.

## 2016-05-16 NOTE — Discharge Summary (Signed)
Physician Discharge Summary  Natalie Friedman EYC:[id number] DOB: 06-07-1950 DOA: 05/12/2016  PCP: Wende Neighbors, MD Nephrologist: Roddie Mc Orthopedist: Dr. Kenton Kingfisher  Admit date: 05/12/2016 Discharge date: 05/16/2016  Admitted From: Home  Disposition:  Home   Recommendations for Outpatient Follow-up:  1. Follow up with PCP in 1 weeks 2. Please obtain BMP/CBC in one week 3. Follow up with nephrologist in 3 weeks  Discharge Condition: STABLE CODE STATUS: FULL   Brief/Interim Summary: HPI: Natalie Friedman is a 66 y.o. female with past medical history of hypertension, no known history of heart failure who was sent to the emergency department after being seen by the PCP earlier today. There was concerned with worsening edema in the lower extremities. The patient had recently been treated for respiratory infection steroid taper. The patient reported that she continued to have increasing edema and shortness of breath over the past 2 weeks. The patient had been started on oral Lasix by the primary care provider there has been no significant improvement in the edema. She reports a gain of 30 pounds in weight over the past 2 weeks. The patient denies chest pain. The patient reports a dry hacking cough and shortness of breath with minimal exertion. In the emergency department to have hypertension with blood pressures in the 149F systolic range. The patient reported that she takes herbal medications to control her blood pressure. The patient also reports that she did not tolerate ACE inhibitor secondary to chronic cough. She also reports that she had intolerance to beta blockers secondary to severe fatigue. She was noted in the ED to have an elevated BNP and some pulmonary edema on chest x-ray. The patient reports that because of a knee injury she has been essentially immobile for the past 3 months. She reports very minimal ambulation. The patient is going to be admitted into the hospital for an acute exacerbation of CHF.  She does not have a known history of this. She was also noted to have renal insufficiency which is not known to be chronic however old records and labs not available.  1. Acute volume overload from nephrotic syndrome - new diagnosis - cardiology evaluated and don't feel like this is CHF, elevated BNP was mild,  Echo with systolic function lower limit of normal but grade 2 DD.  Cardiology consult requested.  Nephrology team started patient on demadex 2/4.  Monitor intake and output, daily weights. Monitor electrolytes closely. PE and DVT ruled out.  2. Hypertensive urgency-improved now, on coreg BID, IV hydralazine ordered. IV lasix discontinued, now on demadex. Nephrologist added Cozaar 100 mg daily.  Follow up with PCP 1 week.  3. Anasarca - secondary to profound volume overload, diuresing as noted above and follow closely. Discontinued amlodipine. 4. 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 24 hour urine collection with 8 gm of protein seen.  Pt to get a renal biopsy in 2 weeks.  I called central scheduling to arrange and they say that they will contact the patient to schedule the procedure after the radiologist has reviewed.     5. Acute on chronic renal insufficiency-unable to obtain any old labs to compare previous renal function tests but I suspect she has a chronic renal insufficiency secondary to medical renal disease including uncontrolled hypertension and diabetes mellitus. Appreciate nephrology consultation.  Pt to have renal biopsy and follow up in 3 weeks with nephrologist.  6. Mobility-patient reports being immobile for last 3 months.  BLE dopplers and VQ scan negative for VTE.  PT recommending  outpatient PT (pt already has been doing this and thus will continue at discharge).   Pt to follow up with Dr. Kenton Kingfisher orthopedics.   7. Diabetes mellitus with renal complications-resume basal insulin, reduced dose given renal insufficiency, monitor blood glucose with  supplemental scale coverage as needed. A1c 6.5%. CBG (last 3)   Recent Labs (last 2 labs)    Recent Labs  05/14/16 2127 05/15/16 0756 05/15/16 1159  GLUCAP 184* 136* 190*      DVT Prophylaxis:hep Code Status:full Family Communication:bedside Disposition Plan:TBD  Consultants:  Cardiology  nephrology  Discharge Diagnoses:  Principal Problem:   CHF (congestive heart failure) ([place]) Active Problems:   Hypertensive urgency   Uncontrolled hypertension   DM (diabetes mellitus), type 2 with renal complications ([place])   anasarca   Immobility   Proteinuria   Nephrotic syndrome  Discharge Instructions  Discharge Instructions    Ambulatory referral to Interventional Radiology    Complete by:  As directed    Please arrange for renal biopsy in 1-2 weeks.   Increase activity slowly    Complete by:  As directed      Allergies as of 05/16/2016   No Known Allergies     Medication List    STOP taking these medications   amLODipine 10 MG tablet Commonly known as:  NORVASC   furosemide 20 MG tablet Commonly known as:  LASIX     TAKE these medications   aspirin 81 MG EC tablet Take 1 tablet (81 mg total) by mouth daily. Start taking on:  05/17/2016   carvedilol 3.125 MG tablet Commonly known as:  COREG Take 1 tablet (3.125 mg total) by mouth 2 (two) times daily with a meal.   cephALEXin 500 MG capsule Commonly known as:  KEFLEX Take 1 capsule (500 mg total) by mouth every 8 (eight) hours.   insulin detemir 100 UNIT/ML injection Commonly known as:  LEVEMIR Inject 0.1 mLs (10 Units total) into the skin at bedtime. What changed:  how much to take   losartan 100 MG tablet Commonly known as:  COZAAR Take 1 tablet (100 mg total) by mouth daily. Start taking on:  05/17/2016   Omega 3 1000 MG Caps Take 1,000 mg by mouth.   potassium chloride 10 MEQ tablet Commonly known as:  K-DUR,KLOR-CON Take 1 tablet (10 mEq total) by mouth daily. What  changed:  medication strength  how much to take   torsemide 20 MG tablet Commonly known as:  DEMADEX Take 2 tablets (40 mg total) by mouth daily. Start taking on:  05/17/2016            Durable Medical Equipment        Start     Ordered   05/15/16 1418  For home use only DME Walker rolling  Once    Question:  Patient needs a walker to treat with the following condition  Answer:  Weakness   05/15/16 1418     Follow-up Information    [facility] Troy S, MD Follow up in 3 week(s).   Specialty:  Nephrology Contact information: [address] [place] [address] [phone]        Wende Neighbors, MD. Schedule an appointment as soon as possible for a visit in 2 week(s).   Specialty:  Internal Medicine Contact information: [place] [address] [phone]        Pablo Lawrence, NP. Schedule an appointment as soon as possible for a visit in 1 week(s).  Specialty:  Adult Health Nurse Practitioner Contact information: [place] [address] [phone]        Arther Abbott, MD. Schedule an appointment as soon as possible for a visit in 2 week(s).   Specialties:  Orthopedic Surgery, Radiology Contact information: [address] [place] [address] [phone]          No Known Allergies  Echocardiogram Study Conclusions  - Left ventricle: The cavity size was normal. There was moderate concentric hypertrophy. Systolic function was at the lower limits   of normal. The estimated ejection fraction was in the range of 50% to 55%. Wall motion was normal; there were no regional wall   motion abnormalities. Features are consistent with a pseudonormal left ventricular filling pattern, with concomitant abnormal   relaxation and increased filling pressure (grade 2 diastolic dysfunction). - Left atrium: The atrium was mildly dilated.    Procedures/Studies: Dg Chest 2 View  Result Date:  05/13/2016 CLINICAL DATA:  CHF.  Leg swelling and shortness of breath. EXAM: CHEST  2 VIEW COMPARISON:  May 12, 2016 FINDINGS: Cardiomegaly and CHF. Scarring or atelectasis in the lateral left lung base. No other interval changes. IMPRESSION: Cardiomegaly and stable mild CHF. Electronically Signed   By: Dorise Bullion III M.D   On: 05/13/2016 12:05   Dg Chest 2 View  Result Date: 05/12/2016 CLINICAL DATA:  Urinary retention.  Lower extremity swelling. EXAM: CHEST  2 VIEW COMPARISON:  None. FINDINGS: The patient has marked cardiomegaly. There is interstitial pulmonary edema. No pneumothorax or pleural effusion. No focal airspace disease. IMPRESSION: Cardiomegaly and mild interstitial edema. Electronically Signed   By: Inge Rise M.D.   On: 05/12/2016 12:33   US Renal  Result Date: 05/12/2016 CLINICAL DATA:  Acute renal insufficiency. EXAM: RENAL / URINARY TRACT ULTRASOUND COMPLETE COMPARISON:  None. FINDINGS: Right Kidney: Length: 10.4 cm. Normal renal cortical thickness and echogenicity without focal lesions or hydronephrosis. No renal calculi. Left Kidney: Length: 12.3 cm. Normal renal cortical thickness and echogenicity without focal lesions or hydronephrosis. No renal calculi. Bladder: Normal IMPRESSION: Normal renal ultrasound examination. Electronically Signed   By: Marijo Sanes M.D.   On: 05/12/2016 17:01   Nm Pulmonary Perf And Vent  Result Date: 05/12/2016 CLINICAL DATA:  Shortness of breath and cough for 2 weeks. EXAM: NUCLEAR MEDICINE VENTILATION - PERFUSION LUNG SCAN TECHNIQUE: Ventilation images were obtained in multiple projections using inhaled aerosol Tc-47m DTPA. Perfusion images were obtained in multiple projections after intravenous injection of Tc-67m MAA. RADIOPHARMACEUTICALS:  Thirty-two mCi Technetium-37m DTPA aerosol inhalation and 4.4 mCi Technetium-75m MAA IV COMPARISON:  Chest x-ray 11/29/2016 FINDINGS: Ventilation: Deposition of pharmaceutical along the airways.  Perfusion: No wedge shaped peripheral perfusion defects to suggest acute pulmonary embolism. IMPRESSION: Low probability for clinically significant pulmonary embolus. Electronically Signed   By: Nolon Nations M.D.   On: 05/12/2016 20:17   US Venous Img Lower Bilateral  Result Date: 05/12/2016 CLINICAL DATA:  Bilateral lower extremity edema with shortness of breath and cough. EXAM: BILATERAL LOWER EXTREMITY VENOUS DUPLEX ULTRASOUND TECHNIQUE: Doppler venous assessment of the bilateral lower extremity deep venous system was performed, including characterization of spectral flow, compressibility, and phasicity. COMPARISON:  None. FINDINGS: There is complete compressibility of the bilateral common femoral, femoral, and popliteal veins. Doppler analysis demonstrates respiratory phasicity and augmentation of flow with calf compression. No obvious superficial vein or calf vein thrombosis. IMPRESSION: No evidence of lower extremity DVT. Electronically Signed   By: Marybelle Killings  M.D.   On: 05/12/2016 15:40    Subjective: Pt reports that she is noticing the improvement in the swelling in her legs and feels better.    Discharge Exam: Vitals:   05/15/16 1924 05/16/16 0346  BP: (!) 151/60 (!) 152/78  Pulse: 68 67  Resp: 20 20  Temp: 98.3 F (36.8 C) 98 F (36.7 C)   Vitals:   05/15/16 1630 05/15/16 1924 05/15/16 1931 05/16/16 0346  BP: 135/70 (!) 151/60  (!) 152/78  Pulse: 65 68  67  Resp: 20 20  20   Temp: 98.2 F (36.8 C) 98.3 F (36.8 C)  98 F (36.7 C)  TempSrc:  Oral  Oral  SpO2: 91% 90% 91% 92%  Weight:    129.1 kg (284 lb 10.2 oz)  Height:        General exam: obese female, lying comfortably supine on the gurney in no obvious distress.  Respiratory system:  No increased work of breathing. Cardiovascular system: S1 & S2 heard.  Gastrointestinal system: Abdomen is slightly less edematous, nondistended, soft and nontender. Normal bowel sounds heard. Central nervous system: Alert and  oriented. No focal neurological deficits. Extremities: 1+ edema bilateral LEs.  The results of significant diagnostics from this hospitalization (including imaging, microbiology, ancillary and laboratory) are listed below for reference.     Microbiology: No results found for this or any previous visit (from the past 240 hour(s)).   Labs: BNP (last 3 results)  Recent Labs  05/12/16 1202  BNP 062.6*   Basic Metabolic Panel:  Recent Labs Lab 05/12/16 1202 05/13/16 0337 05/14/16 0540 05/15/16 0424 05/16/16 0402  NA 140 143 143  141 140  140 141  K 3.8 3.8 4.3  4.2 4.5  4.5 4.4  CL 101 101 99*  98* 99*  98* 101  CO2 33* 35* 37*  36* 34*  34* 33*  GLUCOSE 94 145* 131*  133* 160*  162* 171*  BUN 28* 28* 30*  30* 35*  35* 34*  CREATININE 1.66* 1.80* 1.84*  1.87* 1.95*  2.02* 1.83*  CALCIUM 9.1 8.7* 9.1  9.1 8.9  9.0 9.1  MG  --  1.9 2.0  --   --   PHOS  --   --  3.9 4.1  --    Liver Function Tests:  Recent Labs Lab 05/12/16 1202 05/14/16 0540 05/15/16 0424  AST 13*  --   --   ALT 14  --   --   ALKPHOS 74  --   --   BILITOT 0.5  --   --   PROT 6.4*  --   --   ALBUMIN 3.2* 3.1* 3.1*   No results for input(s): LIPASE, AMYLASE in the last 168 hours. No results for input(s): AMMONIA in the last 168 hours. CBC:  Recent Labs Lab 05/12/16 1202 05/13/16 0337 05/14/16 0540  WBC 6.7 6.5 5.8  NEUTROABS  --  4.4 3.3  HGB 12.6 12.0 12.9  HCT 40.9 38.7 41.3  MCV 90.1 90.0 90.6  PLT 210 196 203   Cardiac Enzymes:  Recent Labs Lab 05/12/16 1202 05/12/16 1608 05/12/16 2239 05/13/16 0337  TROPONINI 0.05* 0.04* 0.04* 0.04*   BNP: Invalid input(s): POCBNP CBG:  Recent Labs Lab 05/15/16 0756 05/15/16 1159 05/15/16 1633 05/15/16 2112 05/16/16 0747  GLUCAP 136* 190* 145* 181* 157*   D-Dimer No results for input(s): DDIMER in the last 72 hours. Hgb A1c No results for input(s): HGBA1C in the last 72 hours. Lipid Profile  No results for  input(s): CHOL, HDL, LDLCALC, TRIG, CHOLHDL, LDLDIRECT in the last 72 hours. Thyroid function studies No results for input(s): TSH, T4TOTAL, T3FREE, THYROIDAB in the last 72 hours.  Invalid input(s): FREET3 Anemia work up No results for input(s): VITAMINB12, FOLATE, FERRITIN, TIBC, IRON, RETICCTPCT in the last 72 hours. Urinalysis    Component Value Date/Time   COLORURINE YELLOW 05/12/2016 1113   APPEARANCEUR CLEAR 05/12/2016 1113   LABSPEC 1.010 05/12/2016 1113   PHURINE 7.0 05/12/2016 1113   GLUCOSEU 50 (A) 05/12/2016 1113   HGBUR NEGATIVE 05/12/2016 1113   BILIRUBINUR NEGATIVE 05/12/2016 1113   KETONESUR NEGATIVE 05/12/2016 1113   PROTEINUR 100 (A) 05/12/2016 1113   NITRITE NEGATIVE 05/12/2016 1113   LEUKOCYTESUR NEGATIVE 05/12/2016 1113   Sepsis Labs Invalid input(s): PROCALCITONIN,  WBC,  LACTICIDVEN Microbiology No results found for this or any previous visit (from the past 240 hour(s)).  Time coordinating discharge: 33 minutes  SIGNED:  Irwin Brakeman, MD  [facility] 05/16/2016, 11:25 AM Pager   If 7PM-7AM, please contact night-coverage [email or web address] Password [email or web address]

## 2016-05-16 NOTE — Progress Notes (Signed)
Subjective: Interval History: Patient presently denies any difficulty breathing. She is feeling better. She has episode of confusion last night and she is assuming this is due to coreg. Patient denies any nausea or vomiting. She is feeling much better today.  Objective: Vital signs in last 24 hours: Temp:  [98 F (36.7 C)-98.3 F (36.8 C)] 98 F (36.7 C) (02/06 0346) Pulse Rate:  [60-68] 67 (02/06 0346) Resp:  [20] 20 (02/06 0346) BP: (135-152)/(60-78) 152/78 (02/06 0346) SpO2:  [90 %-92 %] 92 % (02/06 0346) Weight:  [129.1 kg (284 lb 10.2 oz)] 129.1 kg (284 lb 10.2 oz) (02/06 0346) Weight change: -0.09 kg (-3.2 oz)  Intake/Output from previous day: 02/05 0701 - 02/06 0700 In: 720 [P.O.:720] Out: 3600 [Urine:3600] Intake/Output this shift: No intake/output data recorded.  General appearance: alert, cooperative and no distress Resp: diminished breath sounds posterior - bilateral Cardio: regular rate and rhythm Extremities: edema 2+ edema  Lab Results:  Recent Labs  05/14/16 0540  WBC 5.8  HGB 12.9  HCT 41.3  PLT 203   BMET:   Recent Labs  05/15/16 0424 05/16/16 0402  NA 140  140 141  K 4.5  4.5 4.4  CL 99*  98* 101  CO2 34*  34* 33*  GLUCOSE 160*  162* 171*  BUN 35*  35* 34*  CREATININE 1.95*  2.02* 1.83*  CALCIUM 8.9  9.0 9.1   No results for input(s): PTH in the last 72 hours. Iron Studies: No results for input(s): IRON, TIBC, TRANSFERRIN, FERRITIN in the last 72 hours.  Studies/Results: No results found.  Scheduled: . aspirin EC  81 mg Oral Daily  . carvedilol  3.125 mg Oral BID WC  . heparin  5,000 Units Subcutaneous Q8H  . insulin aspart  0-9 Units Subcutaneous TID WC  . insulin aspart  3 Units Subcutaneous TID WC  . insulin detemir  10 Units Subcutaneous QHS  . mouth rinse  15 mL Mouth Rinse BID  . potassium chloride SA  40 mEq Oral BID  . sodium chloride flush  3 mL Intravenous Q12H  . torsemide  40 mg Oral Daily     Assessment/Plan: Problem #1 CHF: Patient is on Demadex 40 mg once a day. Patient has 3600 mL of urine output. Presently she is asymptomatic. Problem #2 renal failure possibly chronic. Her renal function has started improving. Problem #3 history of hypertension: Her blood pressure is reasonably controlled Problem #4 history of diabetes: Reasonably controlled. Problem #5 obesity Problem #6 history of an equal kidney: Need to rule out renal artery stenosis Problem #7 proteinuria: Nephrotic range. Possibly secondary to diabetes. Plan: 1][We'll continue with Demadex           2]Patient advised to decrease her salt and fluid intake           3]At this moment patient may require kidney biopsy as outpatient.           4]If patient is going to be discharged also urine 3 weeks.           5]We'll start patient on Cozaar 100 mg by mouth daily.    LOS: 4 days   Natalie Friedman S 05/16/2016,8:06 AM

## 2016-05-16 NOTE — Discharge Instructions (Signed)
You will receive a telephone call from central scheduling about setting up the renal biopsy.  If you haven't heard anything in 1 week please call the kidney doctor's office for help with scheduling your renal biopsy.

## 2016-05-22 DIAGNOSIS — I5030 Unspecified diastolic (congestive) heart failure: Secondary | ICD-10-CM | POA: Diagnosis not present

## 2016-05-24 DIAGNOSIS — N179 Acute kidney failure, unspecified: Secondary | ICD-10-CM | POA: Diagnosis not present

## 2016-05-24 DIAGNOSIS — E1129 Type 2 diabetes mellitus with other diabetic kidney complication: Secondary | ICD-10-CM | POA: Diagnosis not present

## 2016-05-24 DIAGNOSIS — Z09 Encounter for follow-up examination after completed treatment for conditions other than malignant neoplasm: Secondary | ICD-10-CM | POA: Diagnosis not present

## 2016-05-24 DIAGNOSIS — I10 Essential (primary) hypertension: Secondary | ICD-10-CM | POA: Diagnosis not present

## 2016-05-25 DIAGNOSIS — I10 Essential (primary) hypertension: Secondary | ICD-10-CM | POA: Diagnosis not present

## 2016-05-26 ENCOUNTER — Other Ambulatory Visit: Payer: Self-pay | Admitting: General Surgery

## 2016-05-26 ENCOUNTER — Other Ambulatory Visit: Payer: Self-pay | Admitting: Radiology

## 2016-05-29 ENCOUNTER — Ambulatory Visit (HOSPITAL_COMMUNITY)
Admission: RE | Admit: 2016-05-29 | Discharge: 2016-05-29 | Disposition: A | Discharge status: Home / Self Care | Payer: Medicare Other | Source: Ambulatory Visit | Attending: Family Medicine | Admitting: Family Medicine

## 2016-05-29 ENCOUNTER — Encounter (HOSPITAL_COMMUNITY): Payer: Self-pay

## 2016-05-29 DIAGNOSIS — Q059 Spina bifida, unspecified: Secondary | ICD-10-CM | POA: Diagnosis not present

## 2016-05-29 DIAGNOSIS — I509 Heart failure, unspecified: Secondary | ICD-10-CM | POA: Insufficient documentation

## 2016-05-29 DIAGNOSIS — Z794 Long term (current) use of insulin: Secondary | ICD-10-CM | POA: Insufficient documentation

## 2016-05-29 DIAGNOSIS — E1121 Type 2 diabetes mellitus with diabetic nephropathy: Secondary | ICD-10-CM | POA: Diagnosis not present

## 2016-05-29 DIAGNOSIS — N179 Acute kidney failure, unspecified: Secondary | ICD-10-CM

## 2016-05-29 DIAGNOSIS — Z7982 Long term (current) use of aspirin: Secondary | ICD-10-CM | POA: Diagnosis not present

## 2016-05-29 DIAGNOSIS — M7989 Other specified soft tissue disorders: Secondary | ICD-10-CM | POA: Diagnosis not present

## 2016-05-29 DIAGNOSIS — R809 Proteinuria, unspecified: Secondary | ICD-10-CM | POA: Diagnosis not present

## 2016-05-29 DIAGNOSIS — I130 Hypertensive heart and chronic kidney disease with heart failure and stage 1 through stage 4 chronic kidney disease, or unspecified chronic kidney disease: Secondary | ICD-10-CM | POA: Diagnosis not present

## 2016-05-29 DIAGNOSIS — N049 Nephrotic syndrome with unspecified morphologic changes: Secondary | ICD-10-CM | POA: Insufficient documentation

## 2016-05-29 LAB — CBC
HEMATOCRIT: 47 % — AB (ref 36.0–46.0)
Hemoglobin: 14.7 g/dL (ref 12.0–15.0)
MCH: 27.1 pg (ref 26.0–34.0)
MCHC: 31.3 g/dL (ref 30.0–36.0)
MCV: 86.7 fL (ref 78.0–100.0)
PLATELETS: 209 10*3/uL (ref 150–400)
RBC: 5.42 MIL/uL — ABNORMAL HIGH (ref 3.87–5.11)
RDW: 14 % (ref 11.5–15.5)
WBC: 8.5 10*3/uL (ref 4.0–10.5)

## 2016-05-29 LAB — PROTIME-INR
INR: 0.95
Prothrombin Time: 12.6 seconds (ref 11.4–15.2)

## 2016-05-29 LAB — APTT: APTT: 28 s (ref 24–36)

## 2016-05-29 LAB — GLUCOSE, CAPILLARY: Glucose-Capillary: 153 mg/dL — ABNORMAL HIGH (ref 65–99)

## 2016-05-29 MED ORDER — MIDAZOLAM HCL 2 MG/2ML IJ SOLN
INTRAMUSCULAR | Status: AC
  Filled 2016-05-29: qty 2

## 2016-05-29 MED ORDER — CLONIDINE HCL 0.1 MG PO TABS
0.1000 mg | ORAL_TABLET | Freq: Once | ORAL | Status: AC
  Administered 2016-05-29: 0.1 mg via ORAL
  Filled 2016-05-29: qty 1

## 2016-05-29 MED ORDER — FENTANYL CITRATE (PF) 100 MCG/2ML IJ SOLN
INTRAMUSCULAR | Status: AC
  Filled 2016-05-29: qty 2

## 2016-05-29 MED ORDER — CLONIDINE HCL 0.1 MG PO TABS
ORAL_TABLET | ORAL | Status: AC
  Filled 2016-05-29: qty 1

## 2016-05-29 MED ORDER — LIDOCAINE HCL 1 % IJ SOLN
INTRAMUSCULAR | Status: AC
  Filled 2016-05-29: qty 20

## 2016-05-29 MED ORDER — FENTANYL CITRATE (PF) 100 MCG/2ML IJ SOLN
INTRAMUSCULAR | Status: AC | PRN
  Administered 2016-05-29: 25 ug via INTRAVENOUS

## 2016-05-29 MED ORDER — SODIUM CHLORIDE 0.9 % IV SOLN: INTRAVENOUS | Status: DC

## 2016-05-29 MED ORDER — MIDAZOLAM HCL 2 MG/2ML IJ SOLN
INTRAMUSCULAR | Status: AC | PRN
  Administered 2016-05-29: 1 mg via INTRAVENOUS

## 2016-05-29 MED ORDER — HYDROCODONE-ACETAMINOPHEN 5-325 MG PO TABS: 1.0000 | ORAL_TABLET | ORAL | Status: DC | PRN

## 2016-05-29 NOTE — H&P (Signed)
Chief Complaint: Proteinuria  Referring Physician(s): Johnson,Clanford L  Supervising Physician: Markus Daft  Patient Status: [facility] - Out-pt  History of Present Illness: Natalie Friedman is a 66 y.o. female with history of diabetes, hypertension, spina bifida, and history of right knee injury who came to the emergency room on 05/12/2016 with complaints of increasing leg swelling, difficulty breathing for the last couple of weeks.   She c/o leg swelling and stated she gained about 30 pounds over the last couple of weeks.   She has a history of renal failure about 2 years ago which was thought to be secondary to medications. However she states that her renal function has improved and recently she thought it has recovered.   She was found to be in heart failure and also acute kidney injury with a creatinine of 1.87 and profound proteinuria.  She is here today for a random renal biopsy.  She feels ok today, no recent fever/chills/flu like symptoms.  She does not take blood thinners. She is NPO except for water about 2 and half hours ago.  Past Medical History:  Diagnosis Date  . Diabetes mellitus without complication ([place])   . Hypertension   . Occult spina bifida     Past Surgical History:  Procedure Laterality Date  . ectopic    . patella fx Right 07/2015    Allergies: Corticosteroids and Gluten meal  Medications: Prior to Admission medications   Medication Sig Start Date End Date Taking? Authorizing Provider  aspirin EC 81 MG EC tablet Take 1 tablet (81 mg total) by mouth daily. 05/17/16  Yes Clanford Marisa Hua, MD  Boswellia Serrata (BOSWELLIA PO) Take 500 mg by mouth 2 (two) times daily.   Yes Historical Provider, MD  carvedilol (COREG) 3.125 MG tablet Take 1 tablet (3.125 mg total) by mouth 2 (two) times daily with a meal. 05/16/16 06/15/16 Yes Clanford L Johnson, MD  insulin detemir (LEVEMIR) 100 UNIT/ML injection Inject 0.1 mLs (10 Units total) into the skin at bedtime.  05/16/16  Yes Clanford Marisa Hua, MD  losartan (COZAAR) 100 MG tablet Take 1 tablet (100 mg total) by mouth daily. 05/17/16  Yes Clanford L Johnson, MD  Omega 3 1000 MG CAPS Take 1,000 mg by mouth daily.    Yes Historical Provider, MD  potassium chloride SA (K-DUR,KLOR-CON) 10 MEQ tablet Take 1 tablet (10 mEq total) by mouth daily. 05/16/16  Yes Clanford Marisa Hua, MD  torsemide (DEMADEX) 20 MG tablet Take 2 tablets (40 mg total) by mouth daily. 05/17/16  Yes Clanford Marisa Hua, MD     History reviewed. No pertinent family history.  Social History   Social History  . Marital status: Unknown    Spouse name: N/A  . Number of children: N/A  . Years of education: N/A   Social History Main Topics  . Smoking status: Never Smoker  . Smokeless tobacco: Never Used  . Alcohol use No  . Drug use: No  . Sexual activity: Not Asked   Other Topics Concern  . None   Social History Narrative  . None    Review of Systems: A 12 point ROS discussed Review of Systems  Constitutional: Negative.   HENT: Negative.   Respiratory: Negative.   Cardiovascular: Positive for leg swelling.  Gastrointestinal: Negative.   Genitourinary: Positive for frequency.  Musculoskeletal:       Spina bifida  Neurological: Negative.   Hematological: Negative.   Psychiatric/Behavioral: Negative.     Vital Signs: BP Marland Kitchen)  180/94   Pulse 67   Temp 98.2 F (36.8 C) (Oral)   Resp 20   Ht 5\' 7"  (1.702 m)   Wt 275 lb (124.7 kg)   SpO2 98%   BMI 43.07 kg/m   Physical Exam  Constitutional:  Obese, NAD  HENT:  Head: Normocephalic and atraumatic.  Eyes: EOM are normal.  Neck: Normal range of motion.  Cardiovascular: Normal rate, regular rhythm and normal heart sounds.   Pulmonary/Chest: Effort normal. No respiratory distress. She has no wheezes.  Abdominal: Soft. She exhibits no distension. There is no tenderness.  Musculoskeletal: Normal range of motion.  Skin: Skin is warm and dry.  Psychiatric: She has a  normal mood and affect. Her behavior is normal. Judgment and thought content normal.  Vitals reviewed.   Mallampati Score:  MD Evaluation Airway: WNL Heart: WNL Abdomen: WNL Chest/ Lungs: WNL ASA  Classification: 3 Mallampati/Airway Score: Two  Imaging: Dg Chest 2 View  Result Date: 05/13/2016 CLINICAL DATA:  CHF.  Leg swelling and shortness of breath. EXAM: CHEST  2 VIEW COMPARISON:  May 12, 2016 FINDINGS: Cardiomegaly and CHF. Scarring or atelectasis in the lateral left lung base. No other interval changes. IMPRESSION: Cardiomegaly and stable mild CHF. Electronically Signed   By: Dorise Bullion III M.D   On: 05/13/2016 12:05   Dg Chest 2 View  Result Date: 05/12/2016 CLINICAL DATA:  Urinary retention.  Lower extremity swelling. EXAM: CHEST  2 VIEW COMPARISON:  None. FINDINGS: The patient has marked cardiomegaly. There is interstitial pulmonary edema. No pneumothorax or pleural effusion. No focal airspace disease. IMPRESSION: Cardiomegaly and mild interstitial edema. Electronically Signed   By: Inge Rise M.D.   On: 05/12/2016 12:33   US Renal  Result Date: 05/12/2016 CLINICAL DATA:  Acute renal insufficiency. EXAM: RENAL / URINARY TRACT ULTRASOUND COMPLETE COMPARISON:  None. FINDINGS: Right Kidney: Length: 10.4 cm. Normal renal cortical thickness and echogenicity without focal lesions or hydronephrosis. No renal calculi. Left Kidney: Length: 12.3 cm. Normal renal cortical thickness and echogenicity without focal lesions or hydronephrosis. No renal calculi. Bladder: Normal IMPRESSION: Normal renal ultrasound examination. Electronically Signed   By: Marijo Sanes M.D.   On: 05/12/2016 17:01   Nm Pulmonary Perf And Vent  Result Date: 05/12/2016 CLINICAL DATA:  Shortness of breath and cough for 2 weeks. EXAM: NUCLEAR MEDICINE VENTILATION - PERFUSION LUNG SCAN TECHNIQUE: Ventilation images were obtained in multiple projections using inhaled aerosol Tc-54m DTPA. Perfusion images were  obtained in multiple projections after intravenous injection of Tc-70m MAA. RADIOPHARMACEUTICALS:  Thirty-two mCi Technetium-42m DTPA aerosol inhalation and 4.4 mCi Technetium-57m MAA IV COMPARISON:  Chest x-ray 11/29/2016 FINDINGS: Ventilation: Deposition of pharmaceutical along the airways. Perfusion: No wedge shaped peripheral perfusion defects to suggest acute pulmonary embolism. IMPRESSION: Low probability for clinically significant pulmonary embolus. Electronically Signed   By: Nolon Nations M.D.   On: 05/12/2016 20:17   US Venous Img Lower Bilateral  Result Date: 05/12/2016 CLINICAL DATA:  Bilateral lower extremity edema with shortness of breath and cough. EXAM: BILATERAL LOWER EXTREMITY VENOUS DUPLEX ULTRASOUND TECHNIQUE: Doppler venous assessment of the bilateral lower extremity deep venous system was performed, including characterization of spectral flow, compressibility, and phasicity. COMPARISON:  None. FINDINGS: There is complete compressibility of the bilateral common femoral, femoral, and popliteal veins. Doppler analysis demonstrates respiratory phasicity and augmentation of flow with calf compression. No obvious superficial vein or calf vein thrombosis. IMPRESSION: No evidence of lower extremity DVT. Electronically Signed   By: Arnell Sieving  Hoss M.D.   On: 05/12/2016 15:40    Labs:  CBC:  Recent Labs  05/12/16 1202 05/13/16 0337 05/14/16 0540 05/29/16 0617  WBC 6.7 6.5 5.8 8.5  HGB 12.6 12.0 12.9 14.7  HCT 40.9 38.7 41.3 47.0*  PLT 210 196 203 209    COAGS:  Recent Labs  05/29/16 0617  INR 0.95  APTT 28    BMP:  Recent Labs  05/13/16 0337 05/14/16 0540 05/15/16 0424 05/16/16 0402  NA 143 143  141 140  140 141  K 3.8 4.3  4.2 4.5  4.5 4.4  CL 101 99*  98* 99*  98* 101  CO2 35* 37*  36* 34*  34* 33*  GLUCOSE 145* 131*  133* 160*  162* 171*  BUN 28* 30*  30* 35*  35* 34*  CALCIUM 8.7* 9.1  9.1 8.9  9.0 9.1  CREATININE 1.80* 1.84*  1.87* 1.95*   2.02* 1.83*  GFRNONAA 28* 27*  27* 25*  24* 27*  GFRAA 32* 32*  31* 29*  28* 32*    LIVER FUNCTION TESTS:  Recent Labs  05/12/16 1202 05/14/16 0540 05/15/16 0424  BILITOT 0.5  --   --   AST 13*  --   --   ALT 14  --   --   ALKPHOS 74  --   --   PROT 6.4*  --   --   ALBUMIN 3.2* 3.1* 3.1*    TUMOR MARKERS: No results for input(s): AFPTM, CEA, CA199, CHROMGRNA in the last 8760 hours.  Assessment and Plan:  Proteinuria Acute kidney injury  Will proceed with image guided random renal biopsy today by Dr. Anselm Pancoast.  Risks and Benefits discussed with the patient including, but not limited to bleeding, infection, damage to adjacent structures or low yield requiring additional tests.  All of the patient's questions were answered, patient is agreeable to proceed. Consent signed and in chart.  Thank you for this interesting consult.  I greatly enjoyed meeting Natalie Friedman and look forward to participating in their care.  A copy of this report was sent to the requesting provider on this date.  Electronically Signed: Murrell Redden PA-C 05/29/2016, 7:28 AM   I spent a total of  30 Minutes in face to face in clinical consultation, greater than 50% of which was counseling/coordinating care for renal biopsy

## 2016-05-29 NOTE — Discharge Instructions (Signed)
Percutaneous Kidney Biopsy, Care After °This sheet gives you information about how to care for yourself after your procedure. Your health care provider may also give you more specific instructions. If you have problems or questions, contact your health care provider. °What can I expect after the procedure? °After the procedure, it is common to have: °· Pain or soreness near the area where the needle went through your skin (biopsy site). °· Bright pink or cloudy urine for 24 hours after the procedure. °Follow these instructions at home: °Activity  °· Return to your normal activities as told by your health care provider. Ask your health care provider what activities are safe for you. °· Do not drive for 24 hours if you were given a medicine to help you relax (sedative). °· Do not lift anything that is heavier than 10 lb (4.5 kg) until your health care provider tells you that it is safe. °· Avoid activities that take a lot of effort (are strenuous) until your health care provider approves. Most people will have to wait 2 weeks before returning to activities such as exercise or sexual intercourse. °General instructions  °· Take over-the-counter and prescription medicines only as told by your health care provider. °· You may eat and drink after your procedure. Follow instructions from your health care provider about eating or drinking restrictions. °· Check your biopsy site every day for signs of infection. Check for: °¨ More redness, swelling, or pain. °¨ More fluid or blood. °¨ Warmth. °¨ Pus or a bad smell. °· Keep all follow-up visits as told by your health care provider. This is important. °Contact a health care provider if: °· You have more redness, swelling, or pain around your biopsy site. °· You have more fluid or blood coming from your biopsy site. °· Your biopsy site feels warm to the touch. °· You have pus or a bad smell coming from your biopsy site. °· You have blood in your urine more than 24 hours after  your procedure. °Get help right away if: °· You have dark red or brown urine. °· You have a fever. °· You are unable to urinate. °· You feel burning when you urinate. °· You feel faint. °· You have severe pain in your abdomen or side. °This information is not intended to replace advice given to you by your health care provider. Make sure you discuss any questions you have with your health care provider. °Document Released: 11/27/2012 Document Revised: 01/07/2016 Document Reviewed: 01/07/2016 °Elsevier Interactive Patient Education © 2017 [employer] ° °

## 2016-05-29 NOTE — Sedation Documentation (Signed)
Patient is resting comfortably. 

## 2016-05-29 NOTE — Procedures (Signed)
US guided core biopsies of left kidney lower pole.  No immediate complication.  Minimal blood loss.  See full report in Imaging.

## 2016-05-29 NOTE — Sedation Documentation (Addendum)
Patient into procedure room and BP 188/90 after positioned on stomach for procedure. Blood pressure cuff and placement changed and blood pressure still 172/101 when positioned back on her back. MD in room and aware. Order placed for clonidine. Patient brought back to the nurses station to monitor BP and give medication. Will continue to monitor.

## 2016-06-05 ENCOUNTER — Encounter (HOSPITAL_COMMUNITY): Payer: Self-pay

## 2016-06-06 DIAGNOSIS — E1129 Type 2 diabetes mellitus with other diabetic kidney complication: Secondary | ICD-10-CM | POA: Diagnosis not present

## 2016-06-06 DIAGNOSIS — N184 Chronic kidney disease, stage 4 (severe): Secondary | ICD-10-CM | POA: Diagnosis not present

## 2016-06-06 DIAGNOSIS — I10 Essential (primary) hypertension: Secondary | ICD-10-CM | POA: Diagnosis not present

## 2016-06-06 DIAGNOSIS — R601 Generalized edema: Secondary | ICD-10-CM | POA: Diagnosis not present

## 2016-06-09 ENCOUNTER — Ambulatory Visit (INDEPENDENT_AMBULATORY_CARE_PROVIDER_SITE_OTHER): Payer: Medicare Other | Admitting: Orthopedic Surgery

## 2016-06-09 ENCOUNTER — Encounter: Payer: Self-pay | Admitting: Orthopedic Surgery

## 2016-06-09 VITALS — BP 181/96 | HR 65 | Ht 67.0 in | Wt 269.0 lb

## 2016-06-09 DIAGNOSIS — M1711 Unilateral primary osteoarthritis, right knee: Secondary | ICD-10-CM | POA: Diagnosis not present

## 2016-06-09 MED ORDER — ACETAMINOPHEN-CODEINE #3 300-30 MG PO TABS: 1.0000 | ORAL_TABLET | Freq: Three times a day (TID) | ORAL | 0 refills | Status: DC | PRN

## 2016-06-09 NOTE — Progress Notes (Signed)
FOLLOW UP VISIT   Patient ID: Natalie Friedman, female   DOB: [birth year], 66 y.o.   MRN: [id number]  Chief Complaint  Patient presents with  . Follow-up    Discuss surgery right knee    HPI Natalie Friedman is a 66 y.o. female.   HPI  66 year old female prior evaluation for right total knee. She comes in after having a acute renal failure with proteinuria which is resolved and is resolving with a creatinine now of 1.8 but her GFR is less than 30. She has seen the kidney specialist and he has signed off on her having surgery.  She would like something for pain something mild.  Review of Systems Review of Systems   Persistent leg edema which has improved since she has been treated for her proteinuria and kidney failure  Physical Exam  She has a flexion contracture she only flexes the knee to about 100 her skin looks good around the knee but she has bilateral pitting edema which is somewhat chronic [place]'s with assistive device her mood is pleasant she jointed 3 her parents is normal her gait is altered by limping favoring the right lower extremity   MEDICAL DECISION MAKING  DATA   Creatinine as stated Hospital notes discharge summary included and reviewed  DIAGNOSIS  Encounter Diagnosis  Name Primary?  . Primary osteoarthritis of right knee Yes     PLAN(RISK)    Meds ordered this encounter  Medications  . acetaminophen-codeine (TYLENOL #3) 300-30 MG tablet    Sig: Take 1 tablet by mouth every 8 (eight) hours as needed for moderate pain.    Dispense:  30 tablet    Refill:  0   [place] controlled substance reporting system reviewed  Right total knee scheduled for April 12 patient will use a time between now and then to improve increase her protein as her albumin was also low. She's been made aware that she is a high-risk surgery patient for orthopedic complications

## 2016-06-09 NOTE — Patient Instructions (Signed)
Will be scheduled for surgery 07/20/16 Roselyn Reef, nurse will be in touch with details of dates

## 2016-06-12 ENCOUNTER — Other Ambulatory Visit: Payer: Self-pay | Admitting: *Deleted

## 2016-06-12 DIAGNOSIS — I10 Essential (primary) hypertension: Secondary | ICD-10-CM | POA: Diagnosis not present

## 2016-06-17 DIAGNOSIS — N183 Chronic kidney disease, stage 3 (moderate): Secondary | ICD-10-CM | POA: Diagnosis not present

## 2016-06-17 DIAGNOSIS — I10 Essential (primary) hypertension: Secondary | ICD-10-CM | POA: Diagnosis not present

## 2016-06-28 ENCOUNTER — Other Ambulatory Visit: Payer: Self-pay | Admitting: *Deleted

## 2016-06-28 DIAGNOSIS — Z96651 Presence of right artificial knee joint: Secondary | ICD-10-CM

## 2016-07-05 DIAGNOSIS — I10 Essential (primary) hypertension: Secondary | ICD-10-CM | POA: Diagnosis not present

## 2016-07-05 DIAGNOSIS — N183 Chronic kidney disease, stage 3 (moderate): Secondary | ICD-10-CM | POA: Diagnosis not present

## 2016-07-06 ENCOUNTER — Encounter: Payer: Self-pay | Admitting: *Deleted

## 2016-07-06 ENCOUNTER — Telehealth: Payer: Self-pay | Admitting: Orthopedic Surgery

## 2016-07-06 NOTE — Progress Notes (Signed)
Per [facility], surgical pre authorization is not required. Authorization required at time of admission.  Reference Ebony J. 07/06/16 11:43am

## 2016-07-06 NOTE — Telephone Encounter (Signed)
Patient states per her insurance the CPM Machine can be ordered from [facility] in [place] and they will pay. The number is [phone] and address is [address].  She wants to know if she can be transferred to [facility] and if so what does she need to do for that to happen.  Please call and advise

## 2016-07-13 NOTE — Telephone Encounter (Signed)
I will take care of CPM order  Rehab is up to her insurance and we will not know until after surgery

## 2016-07-14 NOTE — Patient Instructions (Signed)
Natalie Friedman  07/14/2016     @PREFPERIOPPHARMACY @   Your procedure is scheduled on  07/20/2016   Report to [facility] at  615  A.M.  Call this number if you have problems the morning of surgery:  [phone]   Remember:  Do not eat food or drink liquids after midnight.  Take these medicines the morning of surgery with A SIP OF WATER  Coreg, losartan. Take 1/2 of your usual insulin dosage the night before your surgery. DO NOT take any medications for diabetes the morning of your surgery.   Do not wear jewelry, make-up or nail polish.  Do not wear lotions, powders, or perfumes, or deoderant.  Do not shave 48 hours prior to surgery.  Men may shave face and neck.  Do not bring valuables to the hospital.  [facility] is not responsible for any belongings or valuables.  Contacts, dentures or bridgework may not be worn into surgery.  Leave your suitcase in the car.  After surgery it may be brought to your room.  For patients admitted to the hospital, discharge time will be determined by your treatment team.  Patients discharged the day of surgery will not be allowed to drive home.   Name and phone number of your driver:   family Special instructions:  None  Please read over the following fact sheets that you were given. Anesthesia Post-op Instructions and Care and Recovery After Surgery       Total Knee Replacement Total knee replacement is a surgery to replace your knee joint with a man-made (prosthetic) joint. The man-made joint is called a prosthesis. It replaces parts of the thigh bone (femur), lower leg bone (tibia), and kneecap (patella). This surgery is done to lessen pain and improve knee movement. What happens before the procedure?  Ask your doctor about:  Changing or stopping your normal medicines. This is especially important if you take diabetes medicines or blood thinners.  Taking medicines such as aspirin and ibuprofen. These medicines can thin your  blood. Do not take these medicines before your procedure if your doctor tells you not to.  Get all dental care that you need done before your procedure. Plan to not have dental work done for 3 months after your surgery.  Follow instructions from your doctor about what you cannot eat or drink.  Ask your doctor how your surgical site will be marked or identified.  You may be given antibiotic medicine to help prevent infection.  If your doctor prescribes physical therapy, do exercises as told.  Do not use any tobacco products, such as cigarettes, chewing tobacco, or e-cigarettes. If you need help quitting, ask your doctor.  You may have a physical exam.  You may have tests, such as:  X-rays.  MRI.  CT scan.  Bone scans.  You may have a blood or urine sample taken.  Plan to have someone take you home after the procedure.  If you will be going home right after the procedure, plan to have someone with you for at least 24 hours. It is best to have someone help care for you for at least 4-6 weeks after surgery. What happens during the procedure?  To reduce your risk of infection:  Your health care team will wash or sanitize their hands.  Your skin will be washed with soap.  An IV tube will be put into one of your veins.  You will be given one or  more of the following:  Sedative. This is a medicine that makes you relaxed.  Local anesthetic. This is a medicine to numb the area.  General anesthetic. This is a medicine that makes you fall asleep.  Spinal anesthetic. This is a medicine that numbs your body below the waist.  Regional anesthetic. This is a medicine that numbs everything below the injection site.  A cut (incision) will be made in your knee.  Damaged parts of your thigh bone, lower leg bone, and kneecap will be removed.  A piece of metal (liner) will be placed on your thigh bone. Pieces of plastic will be placed on your lower leg bone and the underside of your  kneecap.  One or more small tubes (drains) may be placed near your cut to help drain fluid.  Your cut will be closed with stitches (sutures), skin glue, or skin tape (adhesive) strips. Medicine may be put on your cut.  A bandage (dressing) will be placed over your cut. The procedure may vary among doctors and hospitals. What happens after the procedure?  Your blood pressure, heart rate, breathing rate, and blood oxygen level will be monitored often until the medicines you were given have worn off.  You may continue to get fluids and medicines through an IV tube.  You will have some pain. There will be medicines to help you.  You may have fluid coming from a drain.  You may have to wear special socks (compression stockings). These help to prevent blood clots and reduce swelling in your legs.  You will be told to move around as much as possible.  You may be given a continuous passive motion machine to use at home. You will be shown how to use this machine.  Do not drive for 24 hours if you received a sedative. This information is not intended to replace advice given to you by your health care provider. Make sure you discuss any questions you have with your health care provider. Document Released: 06/19/2011 Document Revised: 11/29/2015 Document Reviewed: 03/03/2015 Elsevier Interactive Patient Education  2017 [place].  Total Knee Replacement, Care After These instructions give you information about caring for yourself after your procedure. Your doctor may also give you more specific instructions. Call your doctor if you have any problems or questions after your procedure. Follow these instructions at home: Medicines   Take over-the-counter and prescription medicines only as told by your doctor.  If you were prescribed an antibiotic medicine, take it as told by your doctor. Do not stop taking the antibiotic even if you start to feel better.  If you were prescribed a blood  thinner (anticoagulant), take it as told by your doctor. If you have a splint or brace:   Wear the splint or brace as told by your doctor. Remove it only as told by your doctor.  Loosen the splint or brace if your toes tingle, get numb, or turn cold and blue.  Do not let your splint or brace get wet if it is not waterproof.  Keep the splint or brace clean. Bathing    Do not take baths, swim, or use a hot tub until your doctor says it is okay. Ask your doctor if you can take showers. You may only be allowed to take sponge baths for bathing.  If you have a splint or brace that is not waterproof, cover it with a watertight covering when you take a bath or a shower.  Keep your bandage (dressing)  dry until your doctor says it can be taken off. Incision care and drain care   Check your cut from surgery (incision) and your drain every day for signs of infection. Check for:  More redness, swelling, or pain.  More fluid or blood.  Warmth.  Pus or a bad smell.  Follow instructions from your doctor about how to take care of your cut from surgery. Make sure you:  Wash your hands with soap and water before you change your bandage. If you cannot use soap and water, use hand sanitizer.  Change your bandage as told by your doctor.  Leave stitches (sutures), skin glue, or skin tape (adhesive) strips in place. They may need to stay in place for 2 weeks or longer. If tape strips get loose and curl up, you may trim the loose edges. Do not remove tape strips completely unless your doctor says it is okay.  If you have a drain, follow instructions from your doctor about caring for it. Do not remove the drain tube or any bandages unless your doctor says it is okay. Managing pain, stiffness, and swelling    If directed, put ice on your knee.  Put ice in a plastic bag.  Place a towel between your skin and the bag.  Leave the ice on for 20 minutes, 2-3 times per day.  If directed, apply heat  to the affected area as often as told by your doctor. Use the heat source that your doctor recommends, such as a moist heat pack or a heating pad.  Place a towel between your skin and the heat source.  Leave the heat on for 20-30 minutes.  Remove the heat if your skin turns bright red. This is especially important if you are unable to feel pain, heat, or cold. You may have a greater risk of getting burned.  Move your toes often to avoid stiffness and to lessen swelling.  Raise (elevate) your knee above the level of your heart while you are sitting or lying down.  Wear elastic knee support for as long as told by your doctor. Driving    Do not drive until your doctor says it is okay. Ask your doctor when it is safe to drive if you have a splint or brace on your knee.  Do not drive or use heavy machinery while taking prescription pain medicine.  Do not drive for 24 hours if you received a sedative. Activity   Do not lift anything that is heavier than 10 lb (4.5 kg) until your doctor says it is okay.  Do not play contact sports until your doctor says it is okay.  Avoid high-impact activities, including running, jumping rope, and jumping jacks.  Avoid sitting for a long time without moving. Get up and move around at least every few hours.  If physical therapy was prescribed, do exercises as told by your doctor.  Return to your normal activities as told by your doctor. Ask your doctor what activities are safe for you. Safety   Do not use your leg to support your body weight until your doctor says that you can. Use crutches or a walker as told by your doctor. General instructions    Do not have any dental work done for at least 3 months after your surgery. When you do have dental work done, tell your dentist about your joint replacement.  Do not use any tobacco products, such as cigarettes, chewing tobacco, or e-cigarettes. If you need help quitting,  ask your doctor.  Wear  special socks (compression stockings) as told by your doctor.  If you have been sent home with a knee joint motion machine (continuous passive motion machine), use it as told by your doctor.  Drink enough fluid to keep your pee (urine) clear or pale yellow.  If you have been told to lose weight, follow instructions from your doctor about how to do this safely.  Keep all follow-up visits as told by your doctor. This is important. Contact a doctor if:  You have more redness, swelling, or pain around your cut from surgery or your drain.  You have more fluid or blood coming from your cut from surgery or your drain.  Your cut from surgery or your drain area feels warm to the touch.  You have pus or a bad smell coming from your cut from surgery or your drain.  You have a fever.  Your cut breaks open after your doctor removes your stitches, skin glue, or skin tape strips.  Your new joint feels loose.  You have knee pain that does not go away. Get help right away if:  You have a rash.  You have pain in your calf or thigh.  You have swelling in your calf or thigh.  You have shortness of breath.  You have trouble breathing.  You have chest pain.  Your ability to move your knee is getting worse. This information is not intended to replace advice given to you by your health care provider. Make sure you discuss any questions you have with your health care provider. Document Released: 06/19/2011 Document Revised: 11/29/2015 Document Reviewed: 03/03/2015 Elsevier Interactive Patient Education  2017 [employer] PATIENT INSTRUCTIONS POST-ANESTHESIA  IMMEDIATELY FOLLOWING SURGERY:  Do not drive or operate machinery for the first twenty four hours after surgery.  Do not make any important decisions for twenty four hours after surgery or while taking narcotic pain medications or sedatives.  If you develop intractable nausea and vomiting or a severe headache please notify your doctor  immediately.  FOLLOW-UP:  Please make an appointment with your surgeon as instructed. You do not need to follow up with anesthesia unless specifically instructed to do so.  WOUND CARE INSTRUCTIONS (if applicable):  Keep a dry clean dressing on the anesthesia/puncture wound site if there is drainage.  Once the wound has quit draining you may leave it open to air.  Generally you should leave the bandage intact for twenty four hours unless there is drainage.  If the epidural site drains for more than 36-48 hours please call the anesthesia department.  QUESTIONS?:  Please feel free to call your physician or the hospital operator if you have any questions, and they will be happy to assist you.

## 2016-07-17 ENCOUNTER — Encounter (HOSPITAL_COMMUNITY): Payer: Self-pay

## 2016-07-17 ENCOUNTER — Encounter (HOSPITAL_COMMUNITY)
Admission: RE | Admit: 2016-07-17 | Discharge: 2016-07-17 | Disposition: A | Discharge status: Home / Self Care | Payer: Medicare Other | Source: Ambulatory Visit | Attending: Orthopedic Surgery | Admitting: Orthopedic Surgery

## 2016-07-17 ENCOUNTER — Telehealth: Payer: Self-pay | Admitting: Orthopedic Surgery

## 2016-07-17 DIAGNOSIS — Z888 Allergy status to other drugs, medicaments and biological substances status: Secondary | ICD-10-CM | POA: Diagnosis not present

## 2016-07-17 DIAGNOSIS — E1121 Type 2 diabetes mellitus with diabetic nephropathy: Secondary | ICD-10-CM | POA: Diagnosis not present

## 2016-07-17 DIAGNOSIS — Q059 Spina bifida, unspecified: Secondary | ICD-10-CM | POA: Diagnosis not present

## 2016-07-17 DIAGNOSIS — Z794 Long term (current) use of insulin: Secondary | ICD-10-CM | POA: Diagnosis not present

## 2016-07-17 DIAGNOSIS — T402X5A Adverse effect of other opioids, initial encounter: Secondary | ICD-10-CM | POA: Diagnosis not present

## 2016-07-17 DIAGNOSIS — Z91018 Allergy to other foods: Secondary | ICD-10-CM | POA: Diagnosis not present

## 2016-07-17 DIAGNOSIS — E1129 Type 2 diabetes mellitus with other diabetic kidney complication: Secondary | ICD-10-CM | POA: Diagnosis not present

## 2016-07-17 DIAGNOSIS — E1142 Type 2 diabetes mellitus with diabetic polyneuropathy: Secondary | ICD-10-CM | POA: Diagnosis not present

## 2016-07-17 DIAGNOSIS — Z0183 Encounter for blood typing: Secondary | ICD-10-CM | POA: Insufficient documentation

## 2016-07-17 DIAGNOSIS — Z79899 Other long term (current) drug therapy: Secondary | ICD-10-CM | POA: Diagnosis not present

## 2016-07-17 DIAGNOSIS — R400 Somnolence: Secondary | ICD-10-CM | POA: Diagnosis not present

## 2016-07-17 DIAGNOSIS — I110 Hypertensive heart disease with heart failure: Secondary | ICD-10-CM | POA: Diagnosis not present

## 2016-07-17 DIAGNOSIS — M1711 Unilateral primary osteoarthritis, right knee: Secondary | ICD-10-CM | POA: Insufficient documentation

## 2016-07-17 DIAGNOSIS — E875 Hyperkalemia: Secondary | ICD-10-CM | POA: Diagnosis not present

## 2016-07-17 DIAGNOSIS — Z01812 Encounter for preprocedural laboratory examination: Secondary | ICD-10-CM

## 2016-07-17 DIAGNOSIS — N179 Acute kidney failure, unspecified: Secondary | ICD-10-CM | POA: Diagnosis not present

## 2016-07-17 DIAGNOSIS — M24561 Contracture, right knee: Secondary | ICD-10-CM | POA: Diagnosis not present

## 2016-07-17 DIAGNOSIS — R339 Retention of urine, unspecified: Secondary | ICD-10-CM | POA: Diagnosis not present

## 2016-07-17 DIAGNOSIS — Z972 Presence of dental prosthetic device (complete) (partial): Secondary | ICD-10-CM | POA: Diagnosis not present

## 2016-07-17 DIAGNOSIS — I509 Heart failure, unspecified: Secondary | ICD-10-CM | POA: Diagnosis not present

## 2016-07-17 HISTORY — DX: Heart failure, unspecified: I50.9

## 2016-07-17 HISTORY — DX: Unspecified osteoarthritis, unspecified site: M19.90

## 2016-07-17 HISTORY — DX: Polyneuropathy, unspecified: G62.9

## 2016-07-17 LAB — CBC WITH DIFFERENTIAL/PLATELET
BASOS ABS: 0 10*3/uL (ref 0.0–0.1)
Basophils Relative: 0 %
Eosinophils Absolute: 0.2 10*3/uL (ref 0.0–0.7)
Eosinophils Relative: 2 %
HEMATOCRIT: 44.4 % (ref 36.0–46.0)
Hemoglobin: 14.2 g/dL (ref 12.0–15.0)
LYMPHS ABS: 1.8 10*3/uL (ref 0.7–4.0)
Lymphocytes Relative: 23 %
MCH: 27 pg (ref 26.0–34.0)
MCHC: 32 g/dL (ref 30.0–36.0)
MCV: 84.6 fL (ref 78.0–100.0)
MONO ABS: 0.5 10*3/uL (ref 0.1–1.0)
MONOS PCT: 7 %
NEUTROS PCT: 68 %
Neutro Abs: 5.2 10*3/uL (ref 1.7–7.7)
Platelets: 182 10*3/uL (ref 150–400)
RBC: 5.25 MIL/uL — ABNORMAL HIGH (ref 3.87–5.11)
RDW: 14.6 % (ref 11.5–15.5)
WBC: 7.7 10*3/uL (ref 4.0–10.5)

## 2016-07-17 LAB — BASIC METABOLIC PANEL
Anion gap: 12 (ref 5–15)
BUN: 40 mg/dL — AB (ref 6–20)
CHLORIDE: 101 mmol/L (ref 101–111)
CO2: 27 mmol/L (ref 22–32)
Calcium: 9.3 mg/dL (ref 8.9–10.3)
Creatinine, Ser: 2.01 mg/dL — ABNORMAL HIGH (ref 0.44–1.00)
GFR, EST AFRICAN AMERICAN: 28 mL/min — AB (ref >60)
GFR, EST NON AFRICAN AMERICAN: 24 mL/min — AB (ref >60)
Glucose, Bld: 193 mg/dL — ABNORMAL HIGH (ref 65–99)
POTASSIUM: 4.3 mmol/L (ref 3.5–5.1)
SODIUM: 140 mmol/L (ref 135–145)

## 2016-07-17 LAB — ABO/RH: ABO/RH(D): A POS

## 2016-07-17 LAB — SURGICAL PCR SCREEN
MRSA, PCR: NEGATIVE
Staphylococcus aureus: NEGATIVE

## 2016-07-17 LAB — PROTIME-INR
INR: 0.94
PROTHROMBIN TIME: 12.6 s (ref 11.4–15.2)

## 2016-07-17 LAB — PREPARE RBC (CROSSMATCH)

## 2016-07-17 LAB — APTT: APTT: 28 s (ref 24–36)

## 2016-07-17 NOTE — Telephone Encounter (Signed)
Natalie Friedman is scheduled for surgery this coming Thursday.  She said that you and she discussed a medication that she wants to take after surgery in addition to pain medication.  She said you asked her to bring this information by and she did so today.    I have put this information in your basket in the front office for you to review.  Thanks

## 2016-07-19 NOTE — H&P (Signed)
TOTAL KNEE ADMISSION H&P  Patient is being admitted for right total knee arthroplasty.  Subjective:  Chief Complaint:right knee pain.  History of present illness: 66 year old female fell back in 2017 injured her right knee had a second fall during her treatment and eventually had an MRI which showed she had arthritis of the lateral compartment and medial tibial plateau fracture with some depression but no frank instability of the knee  She was treated with physical therapy and bracing and did not improve significantly to allow to return to her normal function  She now comes in for right total knee with a primary complaint of right knee pain medial and lateral with loss of motion including extension for 1 year.  She has trouble walking standing and doing activities of daily living  Patient Active Problem List   Diagnosis Date Noted  . Nephrotic syndrome 05/15/2016  . CHF (congestive heart failure) ([place]) 05/12/2016  . DM (diabetes mellitus), type 2 with renal complications ([place]) 59/[id number]  . Hypertensive urgency 05/12/2016  . anasarca 05/12/2016  . Immobility 05/12/2016  . Proteinuria 05/12/2016  . Uncontrolled hypertension 08/19/2015   Past Medical History:  Diagnosis Date  . Arthritis   . CHF (congestive heart failure) ([place])   . Diabetes mellitus without complication ([place])   . Hypertension   . Neuropathy ([place])   . Occult spina bifida     Past Surgical History:  Procedure Laterality Date  . ectopic    . patella fx Right 07/2015    No prescriptions prior to admission.   Allergies  Allergen Reactions  . Corticosteroids Other (See Comments)    Hallucinations/swelling in legs & feet/passed out (ORAL ROUTE ONLY PER PATIENT)   . Gluten Meal Other (See Comments)    Over time increases inflammation in body    Social History  Substance Use Topics  . Smoking status: Never Smoker  . Smokeless tobacco: Never Used  . Alcohol use No    No family history on file.   Review  of Systems  Constitutional: Negative for fever.  HENT: Positive for congestion. Negative for ear discharge, ear pain, hearing loss, nosebleeds and sore throat.   Eyes: Negative for blurred vision, pain, discharge and redness.  Respiratory: Positive for shortness of breath.        Occasional shortness of breath  Cardiovascular: Positive for leg swelling. Negative for chest pain and palpitations.  Gastrointestinal: Positive for abdominal pain.  Genitourinary: Negative for dysuria.  Musculoskeletal: Positive for myalgias.  Skin: Negative for itching and rash.  Neurological: Negative for dizziness, tingling, tremors, sensory change, focal weakness and loss of consciousness.  Endo/Heme/Allergies: Negative for environmental allergies and polydipsia. Does not bruise/bleed easily.  Psychiatric/Behavioral: Negative for hallucinations, substance abuse and suicidal ideas. The patient is nervous/anxious.   All other systems reviewed and are negative.   Objective:  Physical Exam  Vital signs in last 24 hours:  General appearance: This patient is obese. Her grooming and hygiene are normal. She is oriented 3. Her mood is pleasant her affect is anxious  She is ambulatory with assistive device favoring her right leg  On the right and left upper extremities her alignment is normal she hasa major contracture subluxation atrophy tremor skin lesions. Normal pulse and perfusion no lymph nodes are palpable sensation is normal she has no pathologic reflexes deep tendon reflexes are normal.  As far as the left lower extremity goes overall alignment is normal her range of motion is limited by her leg size but she  does have 125 of knee flexion the knee comes to full extension it is stable strength is normal skin is intact pulses are good temperature is normal pitting edema is noted, she has normal sensation and reflexes are normal  Her overall balance is good although limited by injury to her right leg  As  far as the right lower extremity goes She has a flexion contracture she only flexes the knee to about 100 her skin looks good around the knee but she has bilateral pitting edema which is somewhat chronic, the knee is stable to varus valgus stress testing and AP stress testing she has tenderness on the lateral and medial joint line the limb seems to be in reasonable alignment muscle tone and strength are normal    Labs: CBC Latest Ref Rng & Units 07/17/2016 05/29/2016 05/14/2016  WBC 4.0 - 10.5 K/uL 7.7 8.5 5.8  Hemoglobin 12.0 - 15.0 g/dL 14.2 14.7 12.9  Hematocrit 36.0 - 46.0 % 44.4 47.0(H) 41.3  Platelets 150 - 400 K/uL 182 209 203   BMP Latest Ref Rng & Units 07/17/2016 05/16/2016 05/15/2016  Glucose 65 - 99 mg/dL 193(H) 171(H) 160(H)  BUN 6 - 20 mg/dL 40(H) 34(H) 35(H)  Creatinine 0.44 - 1.00 mg/dL 2.01(H) 1.83(H) 1.95(H)  Sodium 135 - 145 mmol/L 140 141 140  Potassium 3.5 - 5.1 mmol/L 4.3 4.4 4.5  Chloride 101 - 111 mmol/L 101 101 99(L)  CO2 22 - 32 mmol/L 27 33(H) 34(H)  Calcium 8.9 - 10.3 mg/dL 9.3 9.1 8.9     Estimated body mass index is 44.01 kg/m as calculated from the following:   Height as of 07/17/16: 5\' 7"  (1.702 m).   Weight as of 07/17/16: 281 lb (127.5 kg).   Imaging Review Plain radiographs demonstrate severe degenerative joint disease of the right knee(s). The overall alignment isneutral. The bone quality appears to be good for age and reported activity level.  Assessment/Plan:  The patient has become severely decompensated in terms of her overall mobility and activity prior to her injuries. I had a long talk with her and her husband on several occasions and alerted them to the fact that she has a lot of risk factors for complications not limited to her size and her BMI greater than 40, her peripheral edema, her by diabetes.  However, I don't think there is a way to get her back to her baseline health and activity level without the surgery. She is willing to take the increased  risks.  Right total knee arthroplasty and have arranged for extra equipment to be brought in to handle any problems that may develop from the medial tibial plateau fracture  End stage arthritis, right knee   The patient history, physical examination, clinical judgment of the provider and imaging studies are consistent with end stage degenerative joint disease of the right knee(s) and total knee arthroplasty is deemed medically necessary. The treatment options including medical management, injection therapy arthroscopy and arthroplasty were discussed at length. The risks and benefits of total knee arthroplasty were presented and reviewed. The risks due to aseptic loosening, infection, stiffness, patella tracking problems, thromboembolic complications and other imponderables were discussed. The patient acknowledged the explanation, agreed to proceed with the plan and consent was signed. Patient is being admitted for inpatient treatment for surgery, pain control, PT, OT, prophylactic antibiotics, VTE prophylaxis, progressive ambulation and ADL's and discharge planning. The patient is planning to be discharged home with home health services

## 2016-07-20 ENCOUNTER — Inpatient Hospital Stay (HOSPITAL_COMMUNITY)
Admission: RE | Admit: 2016-07-20 | Discharge: 2016-07-24 | DRG: 470 | Disposition: A | Discharge status: Home Health | Payer: Medicare Other | Source: Ambulatory Visit | Attending: Orthopedic Surgery | Admitting: Orthopedic Surgery

## 2016-07-20 ENCOUNTER — Encounter (HOSPITAL_COMMUNITY)
Admission: RE | Disposition: A | Discharge status: Home Health | Payer: Self-pay | Source: Ambulatory Visit | Attending: Orthopedic Surgery

## 2016-07-20 ENCOUNTER — Encounter (HOSPITAL_COMMUNITY): Payer: Self-pay | Admitting: *Deleted

## 2016-07-20 ENCOUNTER — Inpatient Hospital Stay (HOSPITAL_COMMUNITY): Payer: Medicare Other | Admitting: Anesthesiology

## 2016-07-20 ENCOUNTER — Inpatient Hospital Stay (HOSPITAL_COMMUNITY): Payer: Medicare Other

## 2016-07-20 ENCOUNTER — Other Ambulatory Visit: Payer: Self-pay | Admitting: *Deleted

## 2016-07-20 DIAGNOSIS — I110 Hypertensive heart disease with heart failure: Secondary | ICD-10-CM | POA: Diagnosis present

## 2016-07-20 DIAGNOSIS — R400 Somnolence: Secondary | ICD-10-CM | POA: Diagnosis not present

## 2016-07-20 DIAGNOSIS — E875 Hyperkalemia: Secondary | ICD-10-CM | POA: Diagnosis not present

## 2016-07-20 DIAGNOSIS — E1129 Type 2 diabetes mellitus with other diabetic kidney complication: Secondary | ICD-10-CM | POA: Diagnosis not present

## 2016-07-20 DIAGNOSIS — T402X5A Adverse effect of other opioids, initial encounter: Secondary | ICD-10-CM | POA: Diagnosis not present

## 2016-07-20 DIAGNOSIS — Q059 Spina bifida, unspecified: Secondary | ICD-10-CM

## 2016-07-20 DIAGNOSIS — Z91018 Allergy to other foods: Secondary | ICD-10-CM | POA: Diagnosis not present

## 2016-07-20 DIAGNOSIS — E1142 Type 2 diabetes mellitus with diabetic polyneuropathy: Secondary | ICD-10-CM | POA: Diagnosis present

## 2016-07-20 DIAGNOSIS — Z972 Presence of dental prosthetic device (complete) (partial): Secondary | ICD-10-CM | POA: Diagnosis not present

## 2016-07-20 DIAGNOSIS — Y92230 Patient room in hospital as the place of occurrence of the external cause: Secondary | ICD-10-CM | POA: Diagnosis present

## 2016-07-20 DIAGNOSIS — R339 Retention of urine, unspecified: Secondary | ICD-10-CM | POA: Diagnosis not present

## 2016-07-20 DIAGNOSIS — I509 Heart failure, unspecified: Secondary | ICD-10-CM | POA: Diagnosis present

## 2016-07-20 DIAGNOSIS — Z888 Allergy status to other drugs, medicaments and biological substances status: Secondary | ICD-10-CM | POA: Diagnosis not present

## 2016-07-20 DIAGNOSIS — Z471 Aftercare following joint replacement surgery: Secondary | ICD-10-CM | POA: Diagnosis not present

## 2016-07-20 DIAGNOSIS — Z96651 Presence of right artificial knee joint: Secondary | ICD-10-CM

## 2016-07-20 DIAGNOSIS — Z794 Long term (current) use of insulin: Secondary | ICD-10-CM | POA: Diagnosis not present

## 2016-07-20 DIAGNOSIS — Z79899 Other long term (current) drug therapy: Secondary | ICD-10-CM | POA: Diagnosis not present

## 2016-07-20 DIAGNOSIS — E1121 Type 2 diabetes mellitus with diabetic nephropathy: Secondary | ICD-10-CM | POA: Diagnosis present

## 2016-07-20 DIAGNOSIS — Z6841 Body Mass Index (BMI) 40.0 and over, adult: Secondary | ICD-10-CM | POA: Diagnosis not present

## 2016-07-20 DIAGNOSIS — M1711 Unilateral primary osteoarthritis, right knee: Secondary | ICD-10-CM | POA: Diagnosis not present

## 2016-07-20 DIAGNOSIS — M24561 Contracture, right knee: Secondary | ICD-10-CM | POA: Diagnosis present

## 2016-07-20 DIAGNOSIS — N179 Acute kidney failure, unspecified: Secondary | ICD-10-CM | POA: Diagnosis not present

## 2016-07-20 HISTORY — PX: TOTAL KNEE ARTHROPLASTY: SHX125

## 2016-07-20 LAB — GLUCOSE, CAPILLARY
GLUCOSE-CAPILLARY: 146 mg/dL — AB (ref 65–99)
GLUCOSE-CAPILLARY: 153 mg/dL — AB (ref 65–99)
GLUCOSE-CAPILLARY: 189 mg/dL — AB (ref 65–99)
Glucose-Capillary: 183 mg/dL — ABNORMAL HIGH (ref 65–99)

## 2016-07-20 SURGERY — ARTHROPLASTY, KNEE, TOTAL
Anesthesia: General | Site: Knee | Laterality: Right

## 2016-07-20 MED ORDER — ONDANSETRON HCL 4 MG PO TABS: 4.0000 mg | ORAL_TABLET | Freq: Four times a day (QID) | ORAL | Status: DC | PRN

## 2016-07-20 MED ORDER — ARNICA MONTANA PO PLLT: PELLET | Freq: Two times a day (BID) | ORAL | Status: DC | PRN

## 2016-07-20 MED ORDER — ZOLPIDEM TARTRATE 5 MG PO TABS
5.0000 mg | ORAL_TABLET | Freq: Every evening | ORAL | Status: DC | PRN
  Administered 2016-07-21: 5 mg via ORAL
  Filled 2016-07-20: qty 1

## 2016-07-20 MED ORDER — MAGNESIUM HYDROXIDE 400 MG/5ML PO SUSP: 30.0000 mL | Freq: Every day | ORAL | Status: DC | PRN

## 2016-07-20 MED ORDER — CEFAZOLIN SODIUM-DEXTROSE 2-4 GM/100ML-% IV SOLN
2.0000 g | INTRAVENOUS | Status: AC
Start: 2016-07-20 — End: 2016-07-20
  Administered 2016-07-20: 2 g via INTRAVENOUS
  Filled 2016-07-20: qty 100

## 2016-07-20 MED ORDER — CARVEDILOL 12.5 MG PO TABS
25.0000 mg | ORAL_TABLET | Freq: Two times a day (BID) | ORAL | Status: DC
  Administered 2016-07-20 – 2016-07-24 (×7): 25 mg via ORAL
  Filled 2016-07-20 (×7): qty 2

## 2016-07-20 MED ORDER — SODIUM CHLORIDE 0.9 % IJ SOLN
INTRAMUSCULAR | Status: AC
  Filled 2016-07-20: qty 40

## 2016-07-20 MED ORDER — SODIUM CHLORIDE 0.9 % IV SOLN
INTRAVENOUS | Status: DC | PRN
  Administered 2016-07-20: 08:00:00 via INTRAVENOUS

## 2016-07-20 MED ORDER — GABAPENTIN 300 MG PO CAPS
300.0000 mg | ORAL_CAPSULE | Freq: Three times a day (TID) | ORAL | Status: DC
  Administered 2016-07-20 – 2016-07-21 (×5): 300 mg via ORAL
  Filled 2016-07-20 (×5): qty 1

## 2016-07-20 MED ORDER — 0.9 % SODIUM CHLORIDE (POUR BTL) OPTIME
TOPICAL | Status: DC | PRN
  Administered 2016-07-20: 1000 mL

## 2016-07-20 MED ORDER — ACETAMINOPHEN 325 MG PO TABS: 650.0000 mg | ORAL_TABLET | Freq: Four times a day (QID) | ORAL | Status: DC | PRN

## 2016-07-20 MED ORDER — STERILE WATER FOR IRRIGATION IR SOLN
Status: DC | PRN
  Administered 2016-07-20: 2000 mL

## 2016-07-20 MED ORDER — NEOSTIGMINE METHYLSULFATE 10 MG/10ML IV SOLN
INTRAVENOUS | Status: DC | PRN
  Administered 2016-07-20: 3 mg via INTRAVENOUS

## 2016-07-20 MED ORDER — LIDOCAINE HCL (PF) 1 % IJ SOLN
INTRAMUSCULAR | Status: AC
  Filled 2016-07-20: qty 15

## 2016-07-20 MED ORDER — GLYCOPYRROLATE 0.2 MG/ML IJ SOLN
INTRAMUSCULAR | Status: DC | PRN
  Administered 2016-07-20: 0.2 mg via INTRAVENOUS
  Administered 2016-07-20: 0.6 mg via INTRAVENOUS

## 2016-07-20 MED ORDER — TORSEMIDE 20 MG PO TABS
40.0000 mg | ORAL_TABLET | Freq: Every morning | ORAL | Status: DC
  Administered 2016-07-20 – 2016-07-23 (×4): 40 mg via ORAL
  Filled 2016-07-20 (×5): qty 2

## 2016-07-20 MED ORDER — PROPOFOL 10 MG/ML IV BOLUS
INTRAVENOUS | Status: DC | PRN
  Administered 2016-07-20: 140 mg via INTRAVENOUS

## 2016-07-20 MED ORDER — MIDAZOLAM HCL 2 MG/2ML IJ SOLN
1.0000 mg | INTRAMUSCULAR | Status: DC
  Administered 2016-07-20: 2 mg via INTRAVENOUS

## 2016-07-20 MED ORDER — BUPIVACAINE LIPOSOME 1.3 % IJ SUSP
INTRAMUSCULAR | Status: AC
  Filled 2016-07-20: qty 20

## 2016-07-20 MED ORDER — ALPHA-LIPOIC ACID 300 MG PO CAPS: 600.0000 mg | ORAL_CAPSULE | Freq: Every day | ORAL | Status: DC

## 2016-07-20 MED ORDER — GLYCOPYRROLATE 0.2 MG/ML IJ SOLN
0.1000 mg | Freq: Once | INTRAMUSCULAR | Status: AC
  Administered 2016-07-20: 0.02 mg via INTRAVENOUS

## 2016-07-20 MED ORDER — EPHEDRINE SULFATE 50 MG/ML IJ SOLN
INTRAMUSCULAR | Status: DC | PRN
  Administered 2016-07-20: 5 mg via INTRAVENOUS

## 2016-07-20 MED ORDER — BUPIVACAINE-EPINEPHRINE (PF) 0.5% -1:200000 IJ SOLN
INTRAMUSCULAR | Status: DC | PRN
  Administered 2016-07-20: 30 mL

## 2016-07-20 MED ORDER — METOCLOPRAMIDE HCL 10 MG PO TABS: 5.0000 mg | ORAL_TABLET | Freq: Three times a day (TID) | ORAL | Status: DC | PRN

## 2016-07-20 MED ORDER — LIDOCAINE HCL (CARDIAC) 10 MG/ML IV SOLN
INTRAVENOUS | Status: DC | PRN
  Administered 2016-07-20: 30 mg via INTRAVENOUS

## 2016-07-20 MED ORDER — ROCURONIUM BROMIDE 50 MG/5ML IV SOLN
INTRAVENOUS | Status: AC
  Filled 2016-07-20: qty 1

## 2016-07-20 MED ORDER — EPHEDRINE SULFATE 50 MG/ML IJ SOLN
INTRAMUSCULAR | Status: AC
  Filled 2016-07-20: qty 1

## 2016-07-20 MED ORDER — TRANEXAMIC ACID 1000 MG/10ML IV SOLN
1000.0000 mg | INTRAVENOUS | Status: AC
  Administered 2016-07-20: 1000 mg via INTRAVENOUS
  Filled 2016-07-20: qty 10

## 2016-07-20 MED ORDER — CHLORHEXIDINE GLUCONATE 4 % EX LIQD: 60.0000 mL | Freq: Once | CUTANEOUS | Status: DC

## 2016-07-20 MED ORDER — LOSARTAN POTASSIUM 50 MG PO TABS
100.0000 mg | ORAL_TABLET | Freq: Every day | ORAL | Status: DC
  Administered 2016-07-20 – 2016-07-24 (×5): 100 mg via ORAL
  Filled 2016-07-20 (×5): qty 2

## 2016-07-20 MED ORDER — HYDROCODONE-ACETAMINOPHEN 7.5-325 MG PO TABS
1.0000 | ORAL_TABLET | ORAL | Status: DC | PRN
  Administered 2016-07-21: 1 via ORAL
  Filled 2016-07-20: qty 1

## 2016-07-20 MED ORDER — MENTHOL 3 MG MT LOZG: 1.0000 | LOZENGE | OROMUCOSAL | Status: DC | PRN

## 2016-07-20 MED ORDER — SODIUM CHLORIDE 0.9 % IR SOLN
Status: DC | PRN
  Administered 2016-07-20: 3000 mL

## 2016-07-20 MED ORDER — ACETAMINOPHEN 650 MG RE SUPP: 650.0000 mg | Freq: Four times a day (QID) | RECTAL | Status: DC | PRN

## 2016-07-20 MED ORDER — PROPOFOL 10 MG/ML IV BOLUS
INTRAVENOUS | Status: AC
  Filled 2016-07-20: qty 40

## 2016-07-20 MED ORDER — LACTATED RINGERS IV SOLN
INTRAVENOUS | Status: DC
  Administered 2016-07-20: 07:00:00 via INTRAVENOUS

## 2016-07-20 MED ORDER — GLYCOPYRROLATE 0.2 MG/ML IJ SOLN
INTRAMUSCULAR | Status: AC
  Filled 2016-07-20: qty 1

## 2016-07-20 MED ORDER — NEOSTIGMINE METHYLSULFATE 10 MG/10ML IV SOLN
INTRAVENOUS | Status: AC
  Filled 2016-07-20: qty 1

## 2016-07-20 MED ORDER — HYDROMORPHONE HCL 1 MG/ML IJ SOLN
INTRAMUSCULAR | Status: AC
Start: 2016-07-20 — End: 2016-07-20
  Filled 2016-07-20: qty 1

## 2016-07-20 MED ORDER — CELECOXIB 100 MG PO CAPS
200.0000 mg | ORAL_CAPSULE | Freq: Two times a day (BID) | ORAL | Status: DC
  Administered 2016-07-20 – 2016-07-22 (×5): 200 mg via ORAL
  Filled 2016-07-20 (×8): qty 2

## 2016-07-20 MED ORDER — ONDANSETRON HCL 4 MG/2ML IJ SOLN
4.0000 mg | Freq: Four times a day (QID) | INTRAMUSCULAR | Status: DC | PRN
  Administered 2016-07-20 – 2016-07-21 (×2): 4 mg via INTRAVENOUS
  Filled 2016-07-20 (×2): qty 2

## 2016-07-20 MED ORDER — METHOCARBAMOL 500 MG PO TABS: 500.0000 mg | ORAL_TABLET | Freq: Four times a day (QID) | ORAL | Status: DC | PRN

## 2016-07-20 MED ORDER — DIPHENHYDRAMINE HCL 12.5 MG/5ML PO ELIX: 12.5000 mg | ORAL_SOLUTION | ORAL | Status: DC | PRN

## 2016-07-20 MED ORDER — HYDROMORPHONE HCL 1 MG/ML IJ SOLN
0.2500 mg | INTRAMUSCULAR | Status: DC | PRN
  Administered 2016-07-20 (×2): 0.5 mg via INTRAVENOUS

## 2016-07-20 MED ORDER — CEFAZOLIN SODIUM-DEXTROSE 2-4 GM/100ML-% IV SOLN
2.0000 g | Freq: Four times a day (QID) | INTRAVENOUS | Status: AC
  Administered 2016-07-20 (×2): 2 g via INTRAVENOUS
  Filled 2016-07-20 (×2): qty 100

## 2016-07-20 MED ORDER — HYDROMORPHONE HCL 1 MG/ML IJ SOLN: 0.5000 mg | INTRAMUSCULAR | Status: DC | PRN

## 2016-07-20 MED ORDER — OMEGA-3-ACID ETHYL ESTERS 1 G PO CAPS
1000.0000 mg | ORAL_CAPSULE | Freq: Every day | ORAL | Status: DC
  Administered 2016-07-20 – 2016-07-24 (×5): 1000 mg via ORAL
  Filled 2016-07-20 (×5): qty 1

## 2016-07-20 MED ORDER — METHOCARBAMOL 1000 MG/10ML IJ SOLN
500.0000 mg | Freq: Four times a day (QID) | INTRAMUSCULAR | Status: DC | PRN
  Filled 2016-07-20: qty 5

## 2016-07-20 MED ORDER — METOCLOPRAMIDE HCL 5 MG/ML IJ SOLN
5.0000 mg | Freq: Three times a day (TID) | INTRAMUSCULAR | Status: DC | PRN
  Administered 2016-07-20: 10 mg via INTRAVENOUS
  Filled 2016-07-20: qty 2

## 2016-07-20 MED ORDER — PHENOL 1.4 % MT LIQD: 1.0000 | OROMUCOSAL | Status: DC | PRN

## 2016-07-20 MED ORDER — ALUM & MAG HYDROXIDE-SIMETH 200-200-20 MG/5ML PO SUSP: 30.0000 mL | ORAL | Status: DC | PRN

## 2016-07-20 MED ORDER — ASPIRIN EC 325 MG PO TBEC
325.0000 mg | DELAYED_RELEASE_TABLET | Freq: Every day | ORAL | Status: DC
  Administered 2016-07-21 – 2016-07-24 (×3): 325 mg via ORAL
  Filled 2016-07-20 (×3): qty 1

## 2016-07-20 MED ORDER — ROCURONIUM BROMIDE 100 MG/10ML IV SOLN
INTRAVENOUS | Status: DC | PRN
  Administered 2016-07-20: 10 mg via INTRAVENOUS
  Administered 2016-07-20: 35 mg via INTRAVENOUS
  Administered 2016-07-20: 5 mg via INTRAVENOUS
  Administered 2016-07-20: 10 mg via INTRAVENOUS

## 2016-07-20 MED ORDER — POTASSIUM CHLORIDE CRYS ER 10 MEQ PO TBCR
10.0000 meq | EXTENDED_RELEASE_TABLET | Freq: Every day | ORAL | Status: DC
  Administered 2016-07-20 – 2016-07-21 (×2): 10 meq via ORAL
  Filled 2016-07-20 (×2): qty 1

## 2016-07-20 MED ORDER — BUPIVACAINE LIPOSOME 1.3 % IJ SUSP
20.0000 mL | Freq: Once | INTRAMUSCULAR | Status: DC
  Filled 2016-07-20: qty 20

## 2016-07-20 MED ORDER — DOCUSATE SODIUM 100 MG PO CAPS
100.0000 mg | ORAL_CAPSULE | Freq: Two times a day (BID) | ORAL | Status: DC
  Administered 2016-07-20 – 2016-07-24 (×8): 100 mg via ORAL
  Filled 2016-07-20 (×8): qty 1

## 2016-07-20 MED ORDER — LIDOCAINE HCL (PF) 1 % IJ SOLN
INTRAMUSCULAR | Status: AC
  Filled 2016-07-20: qty 5

## 2016-07-20 MED ORDER — KETOROLAC TROMETHAMINE 15 MG/ML IJ SOLN
7.5000 mg | Freq: Four times a day (QID) | INTRAMUSCULAR | Status: AC
  Administered 2016-07-20 – 2016-07-21 (×4): 7.5 mg via INTRAVENOUS
  Filled 2016-07-20 (×4): qty 1

## 2016-07-20 MED ORDER — GLYCOPYRROLATE 0.2 MG/ML IJ SOLN
INTRAMUSCULAR | Status: AC
  Filled 2016-07-20: qty 3

## 2016-07-20 MED ORDER — MIDAZOLAM HCL 2 MG/2ML IJ SOLN
INTRAMUSCULAR | Status: AC
  Filled 2016-07-20: qty 2

## 2016-07-20 MED ORDER — SODIUM CHLORIDE 0.9 % IJ SOLN
INTRAMUSCULAR | Status: AC
  Filled 2016-07-20: qty 10

## 2016-07-20 MED ORDER — SUCCINYLCHOLINE CHLORIDE 20 MG/ML IJ SOLN
INTRAMUSCULAR | Status: AC
  Filled 2016-07-20: qty 1

## 2016-07-20 MED ORDER — BISACODYL 5 MG PO TBEC: 5.0000 mg | DELAYED_RELEASE_TABLET | Freq: Every day | ORAL | Status: DC | PRN

## 2016-07-20 MED ORDER — SODIUM CHLORIDE 0.9 % IV SOLN
INTRAVENOUS | Status: DC | PRN
  Administered 2016-07-20: 60 mL

## 2016-07-20 MED ORDER — INSULIN DETEMIR 100 UNIT/ML ~~LOC~~ SOLN
30.0000 [IU] | Freq: Two times a day (BID) | SUBCUTANEOUS | Status: DC
  Administered 2016-07-20 – 2016-07-24 (×9): 30 [IU] via SUBCUTANEOUS
  Filled 2016-07-20 (×11): qty 0.3

## 2016-07-20 MED ORDER — FENTANYL CITRATE (PF) 250 MCG/5ML IJ SOLN
INTRAMUSCULAR | Status: AC
  Filled 2016-07-20: qty 5

## 2016-07-20 MED ORDER — BUPIVACAINE-EPINEPHRINE (PF) 0.5% -1:200000 IJ SOLN
INTRAMUSCULAR | Status: AC
  Filled 2016-07-20: qty 30

## 2016-07-20 MED ORDER — MAGNESIUM CITRATE PO SOLN: 1.0000 | Freq: Once | ORAL | Status: DC | PRN

## 2016-07-20 MED ORDER — ONDANSETRON HCL 4 MG/2ML IJ SOLN
4.0000 mg | Freq: Once | INTRAMUSCULAR | Status: AC
  Administered 2016-07-20: 4 mg via INTRAVENOUS

## 2016-07-20 MED ORDER — ONDANSETRON HCL 4 MG/2ML IJ SOLN
INTRAMUSCULAR | Status: AC
  Filled 2016-07-20: qty 2

## 2016-07-20 MED ORDER — FENTANYL CITRATE (PF) 100 MCG/2ML IJ SOLN
INTRAMUSCULAR | Status: DC | PRN
  Administered 2016-07-20: 25 ug via INTRAVENOUS
  Administered 2016-07-20: 50 ug via INTRAVENOUS
  Administered 2016-07-20: 25 ug via INTRAVENOUS
  Administered 2016-07-20 (×2): 50 ug via INTRAVENOUS
  Administered 2016-07-20 (×2): 25 ug via INTRAVENOUS

## 2016-07-20 MED ORDER — OXYCODONE HCL 5 MG PO TABS
5.0000 mg | ORAL_TABLET | ORAL | Status: DC | PRN
  Administered 2016-07-21: 10 mg via ORAL
  Filled 2016-07-20: qty 2

## 2016-07-20 MED ORDER — SODIUM CHLORIDE 0.9 % IV SOLN
INTRAVENOUS | Status: AC
  Administered 2016-07-20 – 2016-07-21 (×2): via INTRAVENOUS

## 2016-07-20 SURGICAL SUPPLY — 71 items
BAG HAMPER (MISCELLANEOUS) ×3 IMPLANT
BANDAGE ESMARK 6X9 LF (GAUZE/BANDAGES/DRESSINGS) ×1 IMPLANT
BIT DRILL 3.2X128 (BIT) IMPLANT
BIT DRILL 3.2X128MM (BIT)
BLADE HEX COATED 2.75 (ELECTRODE) ×3 IMPLANT
BLADE SAGITTAL 25.0X1.27X90 (BLADE) ×2 IMPLANT
BLADE SAGITTAL 25.0X1.27X90MM (BLADE) ×1
BNDG ESMARK 6X9 LF (GAUZE/BANDAGES/DRESSINGS) ×3
BOWL SMART MIX CTS (DISPOSABLE) IMPLANT
CAP KNEE TOTAL 3 SIGMA ×3 IMPLANT
CEMENT HV SMART SET (Cement) ×6 IMPLANT
CLOTH BEACON ORANGE TIMEOUT ST (SAFETY) ×3 IMPLANT
COOLER CRYO CUFF IC AND MOTOR (MISCELLANEOUS) ×3 IMPLANT
COVER LIGHT HANDLE STERIS (MISCELLANEOUS) ×6 IMPLANT
CUFF CRYO KNEE LG 20X31 COOLER (ORTHOPEDIC SUPPLIES) ×3 IMPLANT
CUFF CRYO KNEE18X23 MED (MISCELLANEOUS) IMPLANT
CUFF TOURNIQUET SINGLE 34IN LL (TOURNIQUET CUFF) ×3 IMPLANT
CUFF TOURNIQUET SINGLE 44IN (TOURNIQUET CUFF) IMPLANT
DECANTER SPIKE VIAL GLASS SM (MISCELLANEOUS) ×3 IMPLANT
DRAPE BACK TABLE (DRAPES) ×3 IMPLANT
DRAPE EXTREMITY T 121X128X90 (DRAPE) ×3 IMPLANT
DRESSING AQUACEL AG ADV 3.5X12 (MISCELLANEOUS) ×1 IMPLANT
DRSG AQUACEL AG ADV 3.5X12 (MISCELLANEOUS) ×3
DRSG MEPILEX BORDER 4X12 (GAUZE/BANDAGES/DRESSINGS) ×3 IMPLANT
DURAPREP 26ML APPLICATOR (WOUND CARE) ×6 IMPLANT
ELECT REM PT RETURN 9FT ADLT (ELECTROSURGICAL) ×3
ELECTRODE REM PT RTRN 9FT ADLT (ELECTROSURGICAL) ×1 IMPLANT
EVACUATOR 3/16  PVC DRAIN (DRAIN) ×2
EVACUATOR 3/16 PVC DRAIN (DRAIN) ×1 IMPLANT
GLOVE BIO SURGEON STRL SZ7 (GLOVE) ×6 IMPLANT
GLOVE BIOGEL PI IND STRL 7.0 (GLOVE) ×2 IMPLANT
GLOVE BIOGEL PI INDICATOR 7.0 (GLOVE) ×4
GLOVE SKINSENSE NS SZ8.0 LF (GLOVE) ×4
GLOVE SKINSENSE STRL SZ8.0 LF (GLOVE) ×2 IMPLANT
GLOVE SS N UNI LF 8.5 STRL (GLOVE) ×3 IMPLANT
GOWN STRL REUS W/ TWL LRG LVL3 (GOWN DISPOSABLE) ×1 IMPLANT
GOWN STRL REUS W/TWL LRG LVL3 (GOWN DISPOSABLE) ×8 IMPLANT
GOWN STRL REUS W/TWL XL LVL3 (GOWN DISPOSABLE) ×3 IMPLANT
HANDPIECE INTERPULSE COAX TIP (DISPOSABLE) ×2
HOOD W/PEELAWAY (MISCELLANEOUS) ×12 IMPLANT
INST SET MAJOR BONE (KITS) ×3 IMPLANT
IV NS IRRIG 3000ML ARTHROMATIC (IV SOLUTION) ×3 IMPLANT
KIT BLADEGUARD II DBL (SET/KITS/TRAYS/PACK) ×3 IMPLANT
KIT ROOM TURNOVER APOR (KITS) ×3 IMPLANT
MANIFOLD NEPTUNE II (INSTRUMENTS) ×3 IMPLANT
MARKER SKIN DUAL TIP RULER LAB (MISCELLANEOUS) ×3 IMPLANT
NEEDLE HYPO 21X1.5 SAFETY (NEEDLE) ×3 IMPLANT
NS IRRIG 1000ML POUR BTL (IV SOLUTION) ×3 IMPLANT
PACK TOTAL JOINT (CUSTOM PROCEDURE TRAY) ×3 IMPLANT
PAD ARMBOARD 7.5X6 YLW CONV (MISCELLANEOUS) ×3 IMPLANT
PAD DANNIFLEX CPM (ORTHOPEDIC SUPPLIES) ×3 IMPLANT
PIN TROCAR 3 INCH (PIN) IMPLANT
SAW OSC TIP CART 19.5X105X1.3 (SAW) ×3 IMPLANT
SET BASIN LINEN APH (SET/KITS/TRAYS/PACK) ×3 IMPLANT
SET HNDPC FAN SPRY TIP SCT (DISPOSABLE) ×1 IMPLANT
STAPLER VISISTAT 35W (STAPLE) ×6 IMPLANT
STEM TIBIA PFC 13X30MM (Stem) ×3 IMPLANT
SUT BRALON NAB BRD #1 30IN (SUTURE) ×9 IMPLANT
SUT MNCRL 0 VIOLET CTX 36 (SUTURE) ×1 IMPLANT
SUT MON AB 0 CT1 (SUTURE) ×3 IMPLANT
SUT MON AB 2-0 CT1 36 (SUTURE) IMPLANT
SUT MONOCRYL 0 CTX 36 (SUTURE) ×2
SYR 20CC LL (SYRINGE) IMPLANT
SYR 30ML LL (SYRINGE) ×3 IMPLANT
SYR BULB IRRIGATION 50ML (SYRINGE) ×3 IMPLANT
TAPE CLOTH SURG 4X10 WHT LF (GAUZE/BANDAGES/DRESSINGS) ×3 IMPLANT
TOWEL OR 17X26 4PK STRL BLUE (TOWEL DISPOSABLE) ×3 IMPLANT
TOWER CARTRIDGE SMART MIX (DISPOSABLE) ×3 IMPLANT
TRAY FOLEY W/METER SILVER 16FR (SET/KITS/TRAYS/PACK) ×3 IMPLANT
WATER STERILE IRR 1000ML POUR (IV SOLUTION) ×6 IMPLANT
YANKAUER SUCT 12FT TUBE ARGYLE (SUCTIONS) ×3 IMPLANT

## 2016-07-20 NOTE — Anesthesia Preprocedure Evaluation (Signed)
Anesthesia Evaluation  Patient identified by MRN, date of birth, ID band Patient awake    Reviewed: Allergy & Precautions, NPO status , Patient's Chart, lab work & pertinent test results, reviewed documented beta blocker date and time   Airway Mallampati: III  TM Distance: >3 FB Neck ROM: Full    Dental  (+) Teeth Intact, Partial Upper   Pulmonary neg pulmonary ROS,    breath sounds clear to auscultation       Cardiovascular hypertension, Pt. on medications and Pt. on home beta blockers +CHF ( Anasarca)   Rhythm:Regular Rate:Normal     Neuro/Psych Hx spina bifida Peripheral neuropathy    GI/Hepatic negative GI ROS,   Endo/Other  diabetes, Well Controlled, Type 2, Insulin DependentMorbid obesity  Renal/GU Renal InsufficiencyRenal disease ( Nephrotic syndrome)     Musculoskeletal  (+) Arthritis ,   Abdominal   Peds  Hematology   Anesthesia Other Findings   Reproductive/Obstetrics                             Anesthesia Physical Anesthesia Plan  ASA: III  Anesthesia Plan: General   Post-op Pain Management:    Induction: Intravenous, Rapid sequence and Cricoid pressure planned  Airway Management Planned: Oral ETT and Video Laryngoscope Planned  Additional Equipment:   Intra-op Plan:   Post-operative Plan: Extubation in OR  Informed Consent: I have reviewed the patients History and Physical, chart, labs and discussed the procedure including the risks, benefits and alternatives for the proposed anesthesia with the patient or authorized representative who has indicated his/her understanding and acceptance.     Plan Discussed with:   Anesthesia Plan Comments:         Anesthesia Quick Evaluation

## 2016-07-20 NOTE — Op Note (Addendum)
Surgical dictation for right total knee   66 year old female with prior medial tibial plateau fracture presented for right total knee replacement. She was treated with physical therapy and oral analgesics did not improve and had trouble walking and trouble straightening her leg after injury, (she developed a 15 flexion contracture)  Prior to the tibial plateau fracture she had sustained a twisting injury and started having pain in the right knee  Preop diagnosis osteoarthrosis right knee  Postop diagnosis same  Procedure right total knee arthroplasty  Implants size 3 femur, size 2.5 tibia, size 15 polyethylene PS insert, size 35 x 8.5 patella  Depew fixed-bearing Sigma posterior stabilized total knee   Surgical findings there was some softening of the medial tibial plateau. There was extensive scarring in the quadriceps/suprapatellar pouch. There was extensive arthritis of the femoral condyles and periphery of the lateral plateau. 15 flexion contracture under anesthesia.   The patient was identified by 2 approved identification mechanism. The operative extremity was evaluated and found to be acceptable for surgical treatment today. The chart was reviewed. The surgical site was confirmed and marked.  The patient was taken to the operating room and given the following antibiotic. Ancef 2 g. This is consistent with the SCIP medications.  The patient was given the following anesthetic: Gen. anesthesia  The patient was then placed supine on the operating table. A Foley catheter was inserted. The right lower extremity was prepped and draped sterilely from the toes to the groin.  Timeout procedure was executed confirming the patient's name, surgical site, antibiotic administration, x-rays available, and implants available.  The operative right leg,  was exsanguinated with a six-inch Esmarch and the tourniquet was inflated to 300 mmHg.  A straight midline incision was made and taken down to  the extensor mechanism. A medial arthrotomy was performed. The patella was everted and the patellofemoral ligament was released. The anterior cruciate ligament and PCL were resected.  The anterior horns of the lateral and medial meniscus were resected. The medial soft tissue sleeve was elevated to the mid coronal plane.  The external alignment guide for the tibial resection was then applied to the distal and proximal tibia and set for anatomic slope along with 10 MM resection  from the  higher side lateral .   Rotational alignment was set using the malleolus, the tibial tubercle and the tibial spines.  The proximal tibia was resected residual menisci were removed. The tibia was sized using a base plate to a size  2.5 .  A three-eighths inch drill bit was used to enter the femoral canal which was decompressed with suction and irrigation until clear. The distal femoral cutting guide was set for 11 mm distal resection,  5valgus alignment, for a right knee. The distal femur was resected and checked for flatness.  Extension gap was checked and was balanced with a 15 mm insert after release of the posterior capsule  The Depuy Sigma sizing femoral guide was placed and the femur was sized to a size he was between a 3 and a 4 and per protocol we took the size 3 . A 4-in-1 cutting block was placed along with collateral ligament retractors and the distal femoral cuts were completed.   Spacer blocks were used to confirm equal flexion extension gaps. A size 15 MM spacer block gave equal stability and flexion extension.  The notch cutting guide for the femur was then applied and the notch cut was made using a size 3 cutting guide  Trial reduction was completed using size 3 femur, size 2.5 tibia with a 15 mm polyethylene insert trial implants. Patella tracking was normal  We then skeletonized the patella. It measured 22 mm in thickness and the patellar resection was set for 12 mm, however I could not get  the cutting guide on so I cut the patella freehand to the level of the patella and quadriceps tendon respectively   he patella diameter measured 38 mm. We then drilled the peg holes for the patella  The proximal tibia was prepared using the size 2.5 base plate. We drilled centrally to allow for a stemmed implant  Laminar spreaders were used in extension to confirm rectangular extension gap no further releases were needed  Thorough irrigation was performed and the bone was dried and prepared for cement. The cement was mixed on the back table using third generation preparation techniques  60 cc of dilute Exparel was injected into the soft tissues including the posterior capsule.  The implants were then cemented in place and excess cement was removed. The cement was allowed to cure. Irrigation was repeated and excess and residual bone fragments and cement were removed.  A medium-size Hemovac was placed in the joint with 6 openings in the tube left in the joint.  The extensor mechanism was closed with #1 Bralon suture followed by subcutaneous tissue closure using 0 Monocryl suture  30 cc of Marcaine with epinephrine was injected into the joint  Skin approximation was performed using staples  A sterile dressing was applied, followed by a TED hose and then a Cryo/Cuff which was activated   The patient was taken recovery room in stable condition  [id number]  Addendum postoperative x-ray shows that the tibial stem is abutting the lateral tibial cortex. We will therefore use protected weightbearing for 6 weeks and then we will brace the leg for an additional 6 weeks. We will not inhibit her range of motion exercises.

## 2016-07-20 NOTE — Op Note (Signed)
Pt HR 47-51, MD advised, order of Robinul 1cc given. HR now 58-64

## 2016-07-20 NOTE — Progress Notes (Signed)
PT Cancellation Note  Patient Details Name: Natalie Friedman MRN: [id number] DOB: 09-17-1950   Cancelled Treatment:    Reason Eval/Treat Not Completed: Other (comment) (Attempted PT evaluation, however pt expressed that she was very nauseated and had been vomiting.  Will check back tomorrow. )  Beth Tashaun Obey, PT, DPT X: [phone]

## 2016-07-20 NOTE — Addendum Note (Signed)
Addendum  created 07/20/16 1225 by Vista Deck, CRNA   Charge Capture section accepted

## 2016-07-20 NOTE — Anesthesia Procedure Notes (Signed)
Procedure Name: Intubation Date/Time: 07/20/2016 7:45 AM Performed by: Vista Deck Pre-anesthesia Checklist: Patient identified, Emergency Drugs available, Suction available, Patient being monitored and Timeout performed Patient Re-evaluated:Patient Re-evaluated prior to inductionOxygen Delivery Method: Circle system utilized Preoxygenation: Pre-oxygenation with 100% oxygen Intubation Type: IV induction, Rapid sequence and Cricoid Pressure applied Ventilation: Mask ventilation without difficulty Laryngoscope Size: Glidescope and 3 (LoPro) Grade View: Grade I Tube type: Oral Tube size: 7.0 mm Number of attempts: 1 Airway Equipment and Method: Video-laryngoscopy,  Stylet and Oral airway Placement Confirmation: ETT inserted through vocal cords under direct vision,  positive ETCO2 and breath sounds checked- equal and bilateral Secured at: 22 cm Tube secured with: Tape Dental Injury: Teeth and Oropharynx as per pre-operative assessment

## 2016-07-20 NOTE — Interval H&P Note (Signed)
History and Physical Interval Note:  07/20/2016 7:23 AM  Natalie Friedman  has presented today for surgery, with the diagnosis of osteoarthritis right knee  The various methods of treatment have been discussed with the patient and family. After consideration of risks, benefits and other options for treatment, the patient has consented to  Procedure(s): TOTAL KNEE ARTHROPLASTY (Right) as a surgical intervention .  The patient's history has been reviewed, patient examined, no change in status, stable for surgery.  I have reviewed the patient's chart and labs.  Questions were answered to the patient's satisfaction.     Arther Abbott

## 2016-07-20 NOTE — Anesthesia Postprocedure Evaluation (Signed)
Anesthesia Post Note  Patient: Natalie Friedman  Procedure(s) Performed: Procedure(s) (LRB): TOTAL KNEE ARTHROPLASTY (Right)  Patient location during evaluation: PACU Anesthesia Type: General Level of consciousness: awake and alert Pain management: pain level not controlled Vital Signs Assessment: post-procedure vital signs reviewed and stable Respiratory status: spontaneous breathing and patient connected to nasal cannula oxygen Cardiovascular status: stable Anesthetic complications: no     Last Vitals:  Vitals:   07/20/16 1045 07/20/16 1100  BP: (!) 165/77 (!) 166/78  Pulse: (!) 50 (!) 56  Resp: 16 19  Temp:      Last Pain:  Vitals:   07/20/16 1021  TempSrc:   PainSc: 7                  Samin Milke

## 2016-07-20 NOTE — Transfer of Care (Signed)
Immediate Anesthesia Transfer of Care Note  Patient: Natalie Friedman  Procedure(s) Performed: Procedure(s): TOTAL KNEE ARTHROPLASTY (Right)  Patient Location: PACU  Anesthesia Type:General  Level of Consciousness: awake and patient cooperative  Airway & Oxygen Therapy: Patient Spontanous Breathing and non-rebreather face mask  Post-op Assessment: Report given to RN and Post -op Vital signs reviewed and stable  Post vital signs: Reviewed and stable  Last Vitals:  Vitals:   07/20/16 0700 07/20/16 0715  Resp: (!) 21 18  Temp:      Last Pain:  Vitals:   07/20/16 0636  TempSrc: Oral  PainSc: 2       Patients Stated Pain Goal: 6 (95/58/31 6742)  Complications: No apparent anesthesia complications

## 2016-07-20 NOTE — Care Management (Signed)
Patient Information   Patient Name Natalie Friedman, Natalie Friedman ([id number]) Sex Female DOB 1950/06/11  Room Bed  A338 A338-01  Patient Demographics   Address [address] [place] [address] Phone [phone] (Home) E-mail Address [email or web address]  Patient Ethnicity & Race   Ethnic Group Patient Race  Not Hispanic or Latino White or Caucasian  Emergency Contact(s)   Name Relation Home Work Mobile  Natalie Friedman,Natalie Friedman Spouse [phone]    Natalie Friedman, Natalie Friedman [phone]    Documents on File    Status Date Received Description  Documents for the Patient  [place] HIPAA NOTICE OF PRIVACY - Scanned Not Received    [facility] E-Signature HIPAA Notice of Privacy     Driver's License Not Received  05/2021  Insurance Card Not Received    Advance Directives/Living Will/HCPOA/POA Not Received    Other Photo ID Not Received    AMB Patient Logs/Info  07/24/14 BLOOD GLUCOSE LOG [facility]  AMB Correspondence  07/08/14 DIABETES EDU PATIENT LETTER [facility]  AMB Intake Forms/Questionnaires  07/24/14   AMB Correspondence  09/08/14 REFERRAL NIDA MD, G  Insurance Card Received 08/03/15 MEDICARE/ROSM  AMB Correspondence  07/17/15 REFERRAL KEATTS NP, Purvis Kilts MD  Insurance Card Received 08/10/15 ROSM/Medicare and Medicaid  AMB Correspondence  09/01/15 PRIOR APPROVAL [place] Card Received 11/03/15 UHC Medicare Dual Comp+[place] Medicaid  AMB Correspondence  10/06/15 POC KNEELING MD, J  AMB Correspondence  10/06/15 PLAN OF CARE [facility]  AMB Correspondence  10/28/15 PLAN OF CARE [facility]  AMB Correspondence  12/02/15 POC BOYD PT, Z  AMB Correspondence  12/27/15 POC [email or web address]  AMB Correspondence  01/19/16 PROGRESS NOTE KNEELING MD, J.  AMB Correspondence  01/31/16 10/17 DAILY NOTE KEELING MD, J.  AMB Correspondence  03/15/16 11/17 POC SNYDER PT, S  AMB Correspondence  03/27/16 OFFICE NOTES END OF  CARE PT & HAND  Insurance Card     Insurance Card Received 04/24/16 ROSM/UHC and Medicaid  AMB Intake Forms/Questionnaires (Deleted) 07/24/14   AMB Correspondence (Deleted) 09/08/14 REFERRAL GEBRESELASSIE MD, N  Insurance Card Received (Deleted) 10/14/15 ROSM/UHC  AMB Correspondence (Deleted) 12/27/15 POC BOYD PT, Z  AMB Correspondence (Deleted) 03/27/16 OFFICE NOTES END OF CARE  Documents for the Encounter  AOB (Assignment of Insurance Benefits) Not Received    E-signature AOB Signed 07/20/16   MEDICARE RIGHTS Not Received    E-signature Medicare Rights Signed 07/20/16   Admission Information   Attending Provider Admitting Provider Admission Type Admission Date/Time  Carole Civil, MD Carole Civil, MD Elective 07/20/16 [phone]  Discharge Date Hospital Service Auth/Cert Status Service Area   Surgery Incomplete [place]  Unit Room/Bed Admission Status   AP-DEPT 300 A338/A338-01 Admission (Confirmed)   Admission   Complaint  osteoarthritis right knee  Hospital Account   Name Acct ID Class Status Primary Coverage  Natalie Friedman, Natalie Friedman [id number] [place]      Guarantor Account (for Hospital Account [id number])   Name Relation to [place]? Acct Type  Natalie Friedman Self CHSA Yes Personal/Family  Address Phone    [address] [place], [place] [address] [phone])        Coverage Information (for Hospital Account [id number])   [place] MEDICARE   F/O Payor/Plan Precert #  [facility] #  Natalie Friedman, Natalie Friedman [id number]  [place]  [address]  [place] [address] [phone]  2. MEDICAID [place]/MEDICAID OF [place]   F/O Payor/Plan Precert #  MEDICAID [place]/MEDICAID OF [place]   Subscriber Subscriber #  Natalie Friedman, Natalie Friedman [id number]  Address Phone  [phone] [facility],

## 2016-07-21 LAB — BASIC METABOLIC PANEL
Anion gap: 6 (ref 5–15)
BUN: 36 mg/dL — AB (ref 6–20)
CALCIUM: 8.3 mg/dL — AB (ref 8.9–10.3)
CHLORIDE: 102 mmol/L (ref 101–111)
CO2: 30 mmol/L (ref 22–32)
Creatinine, Ser: 1.99 mg/dL — ABNORMAL HIGH (ref 0.44–1.00)
GFR, EST AFRICAN AMERICAN: 29 mL/min — AB (ref >60)
GFR, EST NON AFRICAN AMERICAN: 25 mL/min — AB (ref >60)
GLUCOSE: 118 mg/dL — AB (ref 65–99)
Potassium: 4.2 mmol/L (ref 3.5–5.1)
Sodium: 138 mmol/L (ref 135–145)

## 2016-07-21 LAB — CBC
HEMATOCRIT: 38.6 % (ref 36.0–46.0)
HEMOGLOBIN: 12 g/dL (ref 12.0–15.0)
MCH: 26.7 pg (ref 26.0–34.0)
MCHC: 31.1 g/dL (ref 30.0–36.0)
MCV: 86 fL (ref 78.0–100.0)
Platelets: 145 10*3/uL — ABNORMAL LOW (ref 150–400)
RBC: 4.49 MIL/uL (ref 3.87–5.11)
RDW: 15.1 % (ref 11.5–15.5)
WBC: 7.9 10*3/uL (ref 4.0–10.5)

## 2016-07-21 LAB — GLUCOSE, CAPILLARY
GLUCOSE-CAPILLARY: 162 mg/dL — AB (ref 65–99)
GLUCOSE-CAPILLARY: 172 mg/dL — AB (ref 65–99)
Glucose-Capillary: 156 mg/dL — ABNORMAL HIGH (ref 65–99)

## 2016-07-21 NOTE — Progress Notes (Signed)
Removed foley cath postop day one per MD order.  Patient tolerated well.  Will pass to oncoming nurse to be expecting patient to void.

## 2016-07-21 NOTE — Evaluation (Signed)
Occupational Therapy Evaluation Patient Details Name: Natalie Friedman MRN: [id number] DOB: 11/18/1950 Today's Date: 07/21/2016    History of Present Illness Pt is s/p R TKA.  Protected weightbearing for 6 weeks and then we will brace the leg for an additional 6 weeks.   Clinical Impression   Pt agreeable to participate in OT evaluation. Patient has all necessary DME at home. Education provided regarding the use of reach, sock aid, and long handled sponge once home to increase functional performance during bathing and dressing. Patient's husband is able to assist with bathing and dressing tasks as needed until patient is able to do so independently. No follow up OT needs at this time.     Follow Up Recommendations  No OT follow up    Equipment Recommendations  None recommended by OT       Precautions / Restrictions Precautions Precautions: Fall Precaution Comments: Due to recent surgery Restrictions Weight Bearing Restrictions: Yes RLE Weight Bearing:  (protected weightbearing for 6 weeks ) Other Position/Activity Restrictions: After the first 6 weels we will brace the leg for an additional 6 weeks      Mobility Bed Mobility Overal bed mobility: Modified Independent             General bed mobility comments: use of bed rail to get out of the bed.  Pt was able to scoot from foot up bed up towards head of bed while seated on EOB with increased time.         ADL either performed or assessed with clinical judgement   ADL Overall ADL's : Needs assistance/impaired                     Lower Body Dressing: Total assistance Lower Body Dressing Details (indicate cue type and reason): Donning hospital socks                     Vision Baseline Vision/History: Wears glasses Wears Glasses: Reading only (driving) Patient Visual Report: No change from baseline              Pertinent Vitals/Pain Pain Assessment: 0-10 Pain Score: 2  Pain Location: right  knee Pain Descriptors / Indicators: Aching Pain Intervention(s): Limited activity within patient's tolerance;Monitored during session;Repositioned     Hand Dominance Right   Extremity/Trunk Assessment Upper Extremity Assessment Upper Extremity Assessment: Overall WFL for tasks assessed   Lower Extremity Assessment Lower Extremity Assessment: RLE deficits/detail RLE Deficits / Details: Grossly 3/5 within available range.     Cervical / Trunk Assessment Cervical / Trunk Assessment: Kyphotic   Communication Communication Communication: No difficulties   Cognition Arousal/Alertness: Awake/alert Behavior During Therapy: WFL for tasks assessed/performed Overall Cognitive Status: Within Functional Limits for tasks assessed                                           Shoulder Instructions      Home Living Family/patient expects to be discharged to:: Private residence Living Arrangements: Spouse/significant other Available Help at Discharge: Family;Available 24 hours/day Type of Home: House Home Access: Stairs to enter [employer] of Steps: 2 steps  Entrance Stairs-Rails: None Home Layout: Able to live on main level with bedroom/bathroom;Two level     Bathroom Shower/Tub: Teacher, early years/pre: Standard     Home Equipment: Cane - single point;Walker - 4 wheels;Tub bench;Bedside commode;Walker -  2 wheels   Additional Comments: Pt states that she has been sleeping on a futon mattress that is on the floor.  To get up, she scoots to the foot of the matress, and flexes the R knee up, and uses a chair to pull herself up on.  When she gets into bed, she kneels down to get in the bed.        Prior Functioning/Environment Level of Independence: Independent with assistive device(s)  Gait / Transfers Assistance Needed: Pt states she was using a RW for ambulation, and she has a rollator for outside. ADL's / Homemaking Assistance Needed: assistance  with dressing for socks and shoes only, and occasional supervision for bathing.  Not driving since April - states that stopping is the issues and cannot lift the R LE to switch pedal.                    OT Goals(Current goals can be found in the care plan section) Acute Rehab OT Goals Patient Stated Goal: To get stronger  OT Frequency:      End of Session Equipment Utilized During Treatment: Gait belt  Activity Tolerance: Patient tolerated treatment well Patient left: Other (comment) (with physical therapist while seated on EOB)  OT Visit Diagnosis: Muscle weakness (generalized) (M62.81)                Time: 7673-4193 OT Time Calculation (min): 24 min Charges:  OT General Charges $OT Visit: 1 Procedure OT Evaluation $OT Eval Low Complexity: 1 Procedure G-Codes: OT G-codes **NOT FOR INPATIENT CLASS** Functional Assessment Tool Used: AM-PAC 6 Clicks Daily Activity Functional Limitation: Self care Self Care Current Status (X9024): At least 40 percent but less than 60 percent impaired, limited or restricted Self Care Goal Status (O9735): At least 40 percent but less than 60 percent impaired, limited or restricted Self Care Discharge Status [phone]): At least 40 percent but less than 60 percent impaired, limited or restricted   Ailene Ravel, OTR/L,CBIS  [phone]   Natalie Friedman, Natalie Friedman 07/21/2016, 11:04 AM

## 2016-07-21 NOTE — Care Management Important Message (Signed)
Important Message  Patient Details  Name: Natalie Friedman MRN: [id number] Date of Birth: May 01, 1950   Medicare Important Message Given:  Yes    Sherald Barge, RN 07/21/2016, 2:16 PM

## 2016-07-21 NOTE — Care Management Note (Signed)
Case Management Note  Patient Details  Name: Natalie Friedman MRN: [id number] Date of Birth: November 29, 1950  Subjective/Objective:                  Admitted s/p TKA. Pt lives at home with husband. Pt scared to return home after surgery, however PT has recommended HH and pt not able to pay privately. Pt has accepted HH through [facility], is aware they will come Sunday for PT. Pt has walker and BSC at home pta. She will need CPM and has requested it come from [place], she also would like hospital bed. Pt's husband at bedside for DC plans. No other needs communicated.   Action/Plan: Plan for DC home on 07/22/2016. Tim Justis, of [facility], aware of DC plan and will obtain pt info from chart. Pt info and orders faxed to [facility] bed and CPM to be delivered day of DC.   Expected Discharge Date:  07/22/16               Expected Discharge Plan:  [place]  In-House Referral:  NA  Discharge planning Services  CM Consult  Post Acute Care Choice:  Home Health, Durable Medical Equipment Choice offered to:  Patient  DME Arranged:  Hospital bed, Continuous passive motion machine DME Agency:  [place]:  [place]:  [facility]  Status of Service:  Completed, signed off   Sherald Barge, RN 07/21/2016, 2:18 PM

## 2016-07-21 NOTE — Progress Notes (Signed)
Inpatient Diabetes Program Recommendations  AACE/ADA: New Consensus Statement on Inpatient Glycemic Control (2015)  Target Ranges:  Prepandial:   less than 140 mg/dL      Peak postprandial:   less than 180 mg/dL (1-2 hours)      Critically ill patients:  140 - 180 mg/dL  Results for Natalie Friedman, Natalie Friedman (MRN [id number]) as of 07/21/2016 08:52  Ref. Range 07/21/2016 05:28  Glucose Latest Ref Range: 65 - 99 mg/dL 118 (H)   Results for Natalie Friedman, Natalie Friedman (MRN [id number]) as of 07/21/2016 08:52  Ref. Range 07/20/2016 10:37 07/20/2016 13:02 07/20/2016 21:15  Glucose-Capillary Latest Ref Range: 65 - 99 mg/dL 153 (H) 183 (H) 189 (H)    Review of Glycemic Control  Diabetes history: DM2 Outpatient Diabetes medications: Levemir 30 units BID Current orders for Inpatient glycemic control: Levemir 30 units BID  Inpatient Diabetes Program Recommendations: Correction (SSI): While inpatient, please consider ordering CBGs with Novolog correction scale ACHS.  Thanks, Barnie Alderman, RN, MSN, CDE Diabetes Coordinator Inpatient Diabetes Program [phone] (Team Pager from 8am to 5pm)

## 2016-07-21 NOTE — Anesthesia Postprocedure Evaluation (Signed)
Anesthesia Post Note  Patient: Natalie Friedman  Procedure(s) Performed: Procedure(s) (LRB): TOTAL KNEE ARTHROPLASTY (Right)  Patient location during evaluation: Nursing Unit Anesthesia Type: General Level of consciousness: awake and alert and oriented Pain management: pain level controlled Vital Signs Assessment: post-procedure vital signs reviewed and stable Respiratory status: patient connected to nasal cannula oxygen Cardiovascular status: stable : Some nausea and vomiting yesterday; treated with Zofran and Reglan; Better today. Anesthetic complications: no     Last Vitals:  Vitals:   07/21/16 0000 07/21/16 0400  BP: (!) 105/52 110/62  Pulse: 74 62  Resp: 18 20  Temp: 36.7 C 36.5 C    Last Pain:  Vitals:   07/21/16 0400  TempSrc: Oral  PainSc:                  Bronislaw Switzer A

## 2016-07-21 NOTE — Addendum Note (Signed)
Addendum  created 07/21/16 2712 by Mickel Baas, CRNA   Sign clinical note

## 2016-07-21 NOTE — Evaluation (Signed)
Physical Therapy Evaluation Patient Details Name: Natalie Friedman MRN: [id number] DOB: 03-14-1951 Today's Date: 07/21/2016   History of Present Illness  Pt is s/p R TKA.  Protected weightbearing for 6 weeks and then we will brace the leg for an additional 6 weeks.  Clinical Impression  Pt received in bed, and is agreeable to PT evaluation.  Pt is normally modified independent with ambulation using a RW.  She also states that she normally sleeps on a futon mattress on the floor.  Long discussion on more appropriate places for pt to sleep at home as pt states there is not a bed in the house.  Discussed option for sleeping on the sofa, and pt stated that they may rent a bed for awhile.  During PT evaluation she demonstrates modified independence with supine<>sit, and Min guard for sit<>stand with RW.  She also ambulated 13ft with RW and Min guard.  She did demonstrate SpO2 desaturation to 86% while mobilizing on RA, HR: 69bpm.  This improved to 90% with 2L of supplemental O2, and use of incentive spirometer.  Pt expressed her desire to go to SNF upon d/c, however she is moving well, and she is recommended to return home with HHPT.  Will need to practice steps at next visit to ensure she will be able to safely get in/out of the house.      Follow Up Recommendations Home health PT;Supervision - Intermittent    Equipment Recommendations  None recommended by PT    Recommendations for Other Services       Precautions / Restrictions Precautions Precautions: Fall Precaution Comments: Due to recent surgery Restrictions Weight Bearing Restrictions: Yes RLE Weight Bearing:  (protected weightbearing for 6 weeks ) Other Position/Activity Restrictions: After the first 6 weels we will brace the leg for an additional 6 weeks      Mobility  Bed Mobility Overal bed mobility: Modified Independent             General bed mobility comments: use of bed rail to get out of the bed.    Transfers Overall  transfer level: Needs assistance Equipment used: Rolling walker (2 wheeled) Transfers: Sit to/from Stand Sit to Stand: Min guard         General transfer comment: vc's for hand placement with one hand on the bed, and one hand on the RW.   Ambulation/Gait Ambulation/Gait assistance: Min guard Ambulation Distance (Feet): 60 Feet Assistive device: Rolling walker (2 wheeled) Gait Pattern/deviations: Step-to pattern;Trunk flexed   Gait velocity interpretation: <1.8 ft/sec, indicative of risk for recurrent falls General Gait Details: Pt maintained Protected weight bearing on R LE.  Pt required 1 standing rest break due to fatigue.  Pt noted to desaturate to 86% while on RA with HR at 69bpm.  This improved when placed back on 2L and pt used incentive spirometer.    Stairs            Wheelchair Mobility    Modified Rankin (Stroke Patients Only)       Balance Overall balance assessment: Needs assistance Sitting-balance support: Bilateral upper extremity supported;Feet supported Sitting balance-Leahy Scale: Good     Standing balance support: Bilateral upper extremity supported Standing balance-Leahy Scale: Fair                               Pertinent Vitals/Pain Pain Assessment: 0-10 Pain Score: 2  Pain Location: right knee Pain Descriptors / Indicators: Aching Pain  Intervention(s): Limited activity within patient's tolerance;Monitored during session;Repositioned    Home Living Family/patient expects to be discharged to:: Private residence Living Arrangements: Spouse/significant other Available Help at Discharge: Family;Available 24 hours/day Type of Home: House Home Access: Stairs to enter Entrance Stairs-Rails: None Entrance Stairs-Number of Steps: 2 steps  Home Layout: Able to live on main level with bedroom/bathroom;Two level Home Equipment: Cane - single point;Walker - 4 wheels;Tub bench;Bedside commode;Walker - 2 wheels Additional Comments: Pt  states that she has been sleeping on a futon mattress that is on the floor.  To get up, she scoots to the foot of the matress, and flexes the R knee up, and uses a chair to pull herself up on.  When she gets into bed, she kneels down to get in the bed.      Prior Function Level of Independence: Independent with assistive device(s)   Gait / Transfers Assistance Needed: Pt states she was using a RW for ambulation, and she has a rollator for outside.  ADL's / Homemaking Assistance Needed: assistance with dressing for socks and shoes only, and occasional supervision for bathing.  Not driving since April - states that stopping is the issues and cannot lift the R LE to switch pedal.        Hand Dominance   Dominant Hand: Right    Extremity/Trunk Assessment   Upper Extremity Assessment Upper Extremity Assessment: Overall WFL for tasks assessed    Lower Extremity Assessment Lower Extremity Assessment: RLE deficits/detail RLE Deficits / Details: Grossly 3/5 within available range.      Cervical / Trunk Assessment Cervical / Trunk Assessment: Kyphotic  Communication   Communication: No difficulties  Cognition Arousal/Alertness: Awake/alert Behavior During Therapy: WFL for tasks assessed/performed Overall Cognitive Status: Within Functional Limits for tasks assessed                                        General Comments      Exercises Total Joint Exercises Ankle Circles/Pumps: AROM;Both;20 reps;Supine Quad Sets: Strengthening;Both;10 reps;Supine Short Arc Quad: Strengthening;Right;10 reps;Supine Heel Slides: Strengthening;Right;10 reps;Supine;Limitations Heel Slides Limitations: with min A Straight Leg Raises: Strengthening;Right;Supine;10 reps;Limitations Straight Leg Raises Limitations: with Mod A Goniometric ROM: Pt demonstrates -15* lacking from full extension.    Assessment/Plan    PT Assessment Patient needs continued PT services  PT Problem List  Decreased strength;Decreased range of motion;Decreased activity tolerance;Decreased balance;Decreased mobility;Cardiopulmonary status limiting activity;Decreased skin integrity;Pain;Obesity       PT Treatment Interventions DME instruction;Gait training;Stair training;Functional mobility training;Therapeutic activities;Therapeutic exercise;Balance training;Neuromuscular re-education;Patient/family education    PT Goals (Current goals can be found in the Care Plan section)  Acute Rehab PT Goals Patient Stated Goal: To get stronger PT Goal Formulation: With patient Time For Goal Achievement: 07/28/16 Potential to Achieve Goals: Good    Frequency BID   Barriers to discharge        Co-evaluation               End of Session Equipment Utilized During Treatment: Gait belt Activity Tolerance: Patient tolerated treatment well Patient left: in chair;with call bell/phone within reach Nurse Communication: Mobility status Jenny Reichmann, RN visualized pt ambulating in hall, and mobility sheet left up in the room. ) PT Visit Diagnosis: Muscle weakness (generalized) (M62.81);Other abnormalities of gait and mobility (R26.89);Pain Pain - Right/Left: Right Pain - part of body: Knee    Time: 0930-1000 PT Time Calculation (  min) (ACUTE ONLY): 30 min   Charges:   PT Evaluation $PT Eval Low Complexity: 1 Procedure PT Treatments $Gait Training: 8-22 mins   PT G Codes:   PT G-Codes **NOT FOR INPATIENT CLASS** Functional Assessment Tool Used: AM-PAC 6 Clicks Basic Mobility;Clinical judgement Functional Limitation: Mobility: Walking and moving around Mobility: Walking and Moving Around Current Status (K9574): At least 20 percent but less than 40 percent impaired, limited or restricted Mobility: Walking and Moving Around Goal Status [phone]): At least 1 percent but less than 20 percent impaired, limited or restricted    Beth Angila Wombles, PT, DPT X: [phone]

## 2016-07-21 NOTE — Progress Notes (Signed)
Physical Therapy Treatment Patient Details Name: Natalie Friedman MRN: [id number] DOB: [birth year] Today's Date: 07/21/2016    History of Present Illness Pt is s/p R TKA.  Protected weightbearing for 6 weeks and then we will brace the leg for an additional 6 weeks.    PT Comments    Pt received sitting up in the chair, son present, and pt is agreeable to PT tx.  Pt demonstrates sit<>stand at Mod (I) with RW.  Gait distances is limited to 78ft with RW and Min guard due to increased in pt's chronic back pain.  Pt and son participated in hands on family training for stair negotiation and she was able to negotiate 2 steps with Min A from son.  Pt is on track to d/c home with HHPT.     Follow Up Recommendations  Home health PT;Supervision - Intermittent     Equipment Recommendations  None recommended by PT    Recommendations for Other Services       Precautions / Restrictions Precautions Precautions: Fall Precaution Comments: Due to recent surgery Restrictions Weight Bearing Restrictions: No RLE Weight Bearing: Weight bearing as tolerated Other Position/Activity Restrictions: After the first 6 weels we will brace the leg for an additional 6 weeks    Mobility  Bed Mobility Overal bed mobility: Modified Independent             General bed mobility comments: use of bed rail to get out of the bed.  Pt was able to scoot from foot up bed up towards head of bed while seated on EOB with increased time.   Transfers Overall transfer level: Modified independent Equipment used: Rolling walker (2 wheeled) Transfers: Sit to/from Stand Sit to Stand: Modified independent (Device/Increase time)         General transfer comment: vc's for hand placement with one hand on the bed, and one hand on the RW.   Ambulation/Gait Ambulation/Gait assistance: Min guard Ambulation Distance (Feet): 60 Feet Assistive device: Rolling walker (2 wheeled) Gait Pattern/deviations: Step-to pattern;Trunk  flexed   Gait velocity interpretation: <1.8 ft/sec, indicative of risk for recurrent falls General Gait Details: Pt continues to demonstrate forward flexed posture, and distance is limited due to pt's chronic back pain - she has a history spina bifida.    Stairs Stairs: Yes   Stair Management: No rails;Step to pattern;Backwards;With walker Number of Stairs: 2 General stair comments: Son present and participated in hands on stair training.  Pt was able to negotiate steps backwards with son providing stabilizing assistance to the RW while she negotiated the 2nd step.  Pt and son feel comfortable being able to perform this at home.   Wheelchair Mobility    Modified Rankin (Stroke Patients Only)       Balance Overall balance assessment: Needs assistance Sitting-balance support: Bilateral upper extremity supported;Feet supported Sitting balance-Leahy Scale: Good     Standing balance support: Bilateral upper extremity supported Standing balance-Leahy Scale: Fair                              Cognition Arousal/Alertness: Awake/alert Behavior During Therapy: WFL for tasks assessed/performed Overall Cognitive Status: Within Functional Limits for tasks assessed                                        Exercises Total Joint Exercises Ankle Circles/Pumps: AROM;Both;20 reps;Supine  Quad Sets: Strengthening;Both;10 reps;Supine Short Arc Quad: Strengthening;Right;10 reps;Supine Heel Slides: Strengthening;Right;10 reps;Supine;Limitations Heel Slides Limitations: with min A Straight Leg Raises: Strengthening;Right;Supine;10 reps;Limitations Straight Leg Raises Limitations: with Mod A Goniometric ROM: Pt demonstrates -15* lacking from full extension.     General Comments        Pertinent Vitals/Pain Pain Assessment: Faces Pain Score: 2  Faces Pain Scale: Hurts a little bit Pain Location: right knee Pain Descriptors / Indicators: Aching Pain  Intervention(s): Limited activity within patient's tolerance;Monitored during session;Premedicated before session;Repositioned    Home Living   Living Arrangements: Spouse/significant other Available Help at Discharge: Family;Available 24 hours/day Type of Home: House Home Access: Stairs to enter Entrance Stairs-Rails: None Home Layout: Able to live on main level with bedroom/bathroom;Two level Home Equipment: Cane - single point;Walker - 4 wheels;Tub bench;Bedside commode;Walker - 2 wheels Additional Comments: Pt states that she has been sleeping on a futon mattress that is on the floor.  To get up, she scoots to the foot of the matress, and flexes the R knee up, and uses a chair to pull herself up on.  When she gets into bed, she kneels down to get in the bed.      Prior Function Level of Independence: Independent with assistive device(s)  Gait / Transfers Assistance Needed: Pt states she was using a RW for ambulation, and she has a rollator for outside. ADL's / Homemaking Assistance Needed: assistance with dressing for socks and shoes only, and occasional supervision for bathing.  Not driving since April - states that stopping is the issues and cannot lift the R LE to switch pedal.     PT Goals (current goals can now be found in the care plan section) Acute Rehab PT Goals Patient Stated Goal: To get stronger PT Goal Formulation: With patient Time For Goal Achievement: 07/28/16 Potential to Achieve Goals: Good Progress towards PT goals: Progressing toward goals    Frequency    BID      PT Plan Current plan remains appropriate    Co-evaluation             End of Session Equipment Utilized During Treatment: Gait belt Activity Tolerance: Patient tolerated treatment well Patient left: in bed;with call bell/phone within reach;in CPM Nurse Communication: Mobility status Jenny Reichmann, RN visualized pt ambulating in hall, and mobility sheet left up in the room. ) PT Visit  Diagnosis: Muscle weakness (generalized) (M62.81);Other abnormalities of gait and mobility (R26.89);Pain Pain - Right/Left: Right Pain - part of body: Knee     Time: 1322-1407 PT Time Calculation (min) (ACUTE ONLY): 45 min  Charges:  $Gait Training: 23-37 mins $Therapeutic Exercise: 8-22 mins                    G Codes:  Functional Assessment Tool Used: AM-PAC 6 Clicks Basic Mobility Functional Limitation: Mobility: Walking and moving around Mobility: Walking and Moving Around Current Status (I4580): At least 20 percent but less than 40 percent impaired, limited or restricted Mobility: Walking and Moving Around Goal Status [phone]): At least 1 percent but less than 20 percent impaired, limited or restricted    Beth Cortnee Steinmiller, PT, DPT X: [phone]

## 2016-07-22 LAB — CBC
HCT: 39.6 % (ref 36.0–46.0)
Hemoglobin: 12.3 g/dL (ref 12.0–15.0)
MCH: 26.9 pg (ref 26.0–34.0)
MCHC: 31.1 g/dL (ref 30.0–36.0)
MCV: 86.5 fL (ref 78.0–100.0)
PLATELETS: 127 10*3/uL — AB (ref 150–400)
RBC: 4.58 MIL/uL (ref 3.87–5.11)
RDW: 15.5 % (ref 11.5–15.5)
WBC: 8.5 10*3/uL (ref 4.0–10.5)

## 2016-07-22 LAB — BASIC METABOLIC PANEL
ANION GAP: 8 (ref 5–15)
BUN: 49 mg/dL — AB (ref 6–20)
CALCIUM: 8.6 mg/dL — AB (ref 8.9–10.3)
CO2: 25 mmol/L (ref 22–32)
Chloride: 103 mmol/L (ref 101–111)
Creatinine, Ser: 2.81 mg/dL — ABNORMAL HIGH (ref 0.44–1.00)
GFR calc Af Amer: 19 mL/min — ABNORMAL LOW (ref >60)
GFR, EST NON AFRICAN AMERICAN: 16 mL/min — AB (ref >60)
GLUCOSE: 196 mg/dL — AB (ref 65–99)
POTASSIUM: 5.6 mmol/L — AB (ref 3.5–5.1)
SODIUM: 136 mmol/L (ref 135–145)

## 2016-07-22 LAB — GLUCOSE, CAPILLARY
GLUCOSE-CAPILLARY: 184 mg/dL — AB (ref 65–99)
GLUCOSE-CAPILLARY: 229 mg/dL — AB (ref 65–99)
Glucose-Capillary: 171 mg/dL — ABNORMAL HIGH (ref 65–99)

## 2016-07-22 MED ORDER — FUROSEMIDE 10 MG/ML IJ SOLN: 20.0000 mg | Freq: Once | INTRAMUSCULAR | Status: DC

## 2016-07-22 MED ORDER — SODIUM CHLORIDE 0.9 % IV BOLUS (SEPSIS): 500.0000 mL | Freq: Once | INTRAVENOUS | Status: DC

## 2016-07-22 MED ORDER — NALOXONE HCL 0.4 MG/ML IJ SOLN
0.4000 mg | Freq: Once | INTRAMUSCULAR | Status: AC
  Administered 2016-07-22: 0.4 mg via INTRAVENOUS
  Filled 2016-07-22: qty 1

## 2016-07-22 MED ORDER — HYDROCODONE-ACETAMINOPHEN 5-325 MG PO TABS
1.0000 | ORAL_TABLET | ORAL | Status: DC | PRN
  Filled 2016-07-22: qty 1

## 2016-07-22 MED ORDER — GABAPENTIN 100 MG PO CAPS
200.0000 mg | ORAL_CAPSULE | Freq: Three times a day (TID) | ORAL | Status: DC
  Administered 2016-07-22 – 2016-07-24 (×6): 200 mg via ORAL
  Filled 2016-07-22 (×6): qty 2

## 2016-07-22 MED ORDER — SODIUM CHLORIDE 0.9 % IV SOLN
INTRAVENOUS | Status: DC
  Administered 2016-07-22 – 2016-07-23 (×3): via INTRAVENOUS

## 2016-07-22 NOTE — Progress Notes (Signed)
Subjective: POD 2 RT TKA  HARD TO AROUSE, NURSES INDICATE STARTED AFTER OPIOID FOR PAIN   Objective: Vital signs in last 24 hours: Temp:  [97.9 F (36.6 C)-98.8 F (37.1 C)] 97.9 F (36.6 C) (04/14 0700) Pulse Rate:  [62-79] 76 (04/14 0700) Resp:  [18-20] 18 (04/14 0700) BP: (106-158)/(47-75) 158/75 (04/14 0700) SpO2:  [86 %-100 %] 96 % (04/14 0700)  Intake/Output from previous day: 04/13 0701 - 04/14 0700 In: 720 [P.O.:720] Out: 20 [Drains:20] Intake/Output this shift: No intake/output data recorded.   Recent Labs  07/21/16 0528 07/22/16 0732  HGB 12.0 12.3    Recent Labs  07/21/16 0528 07/22/16 0732  WBC 7.9 8.5  RBC 4.49 4.58  HCT 38.6 39.6  PLT 145* 127*    Recent Labs  07/21/16 0528 07/22/16 0732  NA 138 136  K 4.2 5.6*  CL 102 103  CO2 30 25  BUN 36* 49*  CREATININE 1.99* 2.81*  GLUCOSE 118* 196*  CALCIUM 8.3* 8.6*   No results for input(s): LABPT, INR in the last 72 hours.  Intact pulses distally Incision: dressing C/D/I No cellulitis present Compartment soft  Assessment/Plan: HYPERKALEMIA CR INCREASED TO 2.8 (FROM 1.99; PRE OP HAD RENAL CONSULT FOR ACUTE RENAL INSUFF; WHICH IMPROVED WITH MEDICAL MANAGEMENT)  PLAN: I M GIVING HER NARCAN CHANGING HER PAIN MED GIVING FLUID TO FLUSH THE KIDNEYS DECREASING HER GABAPENTIN   Arther Abbott 07/22/2016, 9:56 AM

## 2016-07-22 NOTE — Progress Notes (Signed)
PT Cancellation Note  Patient Details Name: Natalie Friedman MRN: [id number] DOB: November 20, 1950   Cancelled Treatment:    Reason Eval/Treat Not Completed: Patient's level of consciousness;Patient not medically ready (Pt is very difficult to arouse.  Able to open eyes with heavy tactile stimulus, but not able to maintain eyes open.  Spoke with RN who stated that Dr. Aline Brochure has just been to see the pt and ordering Narcan for her.  Will check back.)   Beth Yohance Hathorne, PT, DPT X: [phone]

## 2016-07-22 NOTE — Progress Notes (Signed)
Physical Therapy Treatment Patient Details Name: Natalie Friedman MRN: [id number] DOB: [birth year] Today's Date: 07/22/2016    History of Present Illness Pt is s/p R TKA.  Protected weightbearing for 6 weeks and then we will brace the leg for an additional 6 weeks.    PT Comments    Pt received in bed, awake, but still lethargic.  Pt requires increased cues for exercises today, as well as increased assistance for all functional mobiltiy.  She demonstrates UE tremors and has difficulty grasping things in her room such as the phone.  She was able to transfer bed<>BSC with Mod A and then returned to the bed with Mod/Max A with RW.  Will continue to progress pt as she is able, however her lethargy today limited her progress during tx.      Follow Up Recommendations  Home health PT;Supervision - Intermittent;Other (comment) (possible SNF depending on her progress tomorrow.  )     Equipment Recommendations  None recommended by PT    Recommendations for Other Services       Precautions / Restrictions Precautions Precautions: Fall Precaution Comments: Due to recent surgery and lethargy Restrictions RLE Weight Bearing: Weight bearing as tolerated Other Position/Activity Restrictions: After the first 6 weels we will brace the leg for an additional 6 weeks    Mobility  Bed Mobility Overal bed mobility: Needs Assistance Bed Mobility: Supine to Sit     Supine to sit: Min assist     General bed mobility comments: Pt demonstrates poor coordination today with UE's and attempts to sit up on EOB.  Therefore, requires assistance.   Transfers Overall transfer level: Needs assistance Equipment used: Rolling walker (2 wheeled) Transfers: Sit to/from [facility] Sit to Stand: Mod assist Stand pivot transfers: Mod assist       General transfer comment: vc's for hand placement, and pt was able to transfer bed<>BSC, however she is very unsteady today.  Upon return to the bed, she  demonstrated poor eccentric and unexpected lowering to the bed.  She was then able to perform lateral scoot towards the [facility].   Ambulation/Gait Ambulation/Gait assistance:  (NA due to lethargy and increased weakness noted today.  )               Stairs            Wheelchair Mobility    Modified Rankin (Stroke Patients Only)       Balance Overall balance assessment: Needs assistance Sitting-balance support: Bilateral upper extremity supported;Feet supported Sitting balance-Leahy Scale: Good     Standing balance support: Bilateral upper extremity supported Standing balance-Leahy Scale: Poor                              Cognition Arousal/Alertness: Lethargic Behavior During Therapy: WFL for tasks assessed/performed Overall Cognitive Status: Within Functional Limits for tasks assessed                                        Exercises Total Joint Exercises Ankle Circles/Pumps: AROM;Both;20 reps;Supine Quad Sets: Strengthening;Both;10 reps;Supine Short Arc Quad: Strengthening;Right;10 reps;Supine Heel Slides: Strengthening;Right;10 reps;Supine;Limitations AROM R knee: -8* extension, and 80* flexion   General Comments        Pertinent Vitals/Pain Pain Assessment: No/denies pain    Home Living  Prior Function            PT Goals (current goals can now be found in the care plan section) Acute Rehab PT Goals Patient Stated Goal: To get stronger PT Goal Formulation: With patient Time For Goal Achievement: 07/28/16 Potential to Achieve Goals: Good Progress towards PT goals: Progressing toward goals    Frequency    BID      PT Plan Current plan remains appropriate    Co-evaluation             End of Session Equipment Utilized During Treatment: Gait belt;Oxygen Activity Tolerance: Patient limited by lethargy Patient left: in bed;with call bell/phone within reach;in CPM   PT Visit  Diagnosis: Muscle weakness (generalized) (M62.81);Other abnormalities of gait and mobility (R26.89);Pain Pain - Right/Left: Right Pain - part of body: Knee     Time: 6578-4696 PT Time Calculation (min) (ACUTE ONLY): 43 min  Charges:  $Therapeutic Exercise: 8-22 mins $Therapeutic Activity: 23-37 mins                    G Codes:       Beth Marykathleen Russi, PT, DPT X: [phone]

## 2016-07-23 LAB — CBC
HEMATOCRIT: 35.7 % — AB (ref 36.0–46.0)
HEMOGLOBIN: 11.1 g/dL — AB (ref 12.0–15.0)
MCH: 26.9 pg (ref 26.0–34.0)
MCHC: 31.1 g/dL (ref 30.0–36.0)
MCV: 86.4 fL (ref 78.0–100.0)
Platelets: 128 10*3/uL — ABNORMAL LOW (ref 150–400)
RBC: 4.13 MIL/uL (ref 3.87–5.11)
RDW: 15.7 % — ABNORMAL HIGH (ref 11.5–15.5)
WBC: 7 10*3/uL (ref 4.0–10.5)

## 2016-07-23 LAB — BASIC METABOLIC PANEL
Anion gap: 6 (ref 5–15)
BUN: 58 mg/dL — ABNORMAL HIGH (ref 6–20)
CO2: 27 mmol/L (ref 22–32)
Calcium: 8.3 mg/dL — ABNORMAL LOW (ref 8.9–10.3)
Chloride: 105 mmol/L (ref 101–111)
Creatinine, Ser: 2.71 mg/dL — ABNORMAL HIGH (ref 0.44–1.00)
GFR, EST AFRICAN AMERICAN: 20 mL/min — AB (ref >60)
GFR, EST NON AFRICAN AMERICAN: 17 mL/min — AB (ref >60)
Glucose, Bld: 161 mg/dL — ABNORMAL HIGH (ref 65–99)
Potassium: 4.6 mmol/L (ref 3.5–5.1)
Sodium: 138 mmol/L (ref 135–145)

## 2016-07-23 LAB — GLUCOSE, CAPILLARY
GLUCOSE-CAPILLARY: 149 mg/dL — AB (ref 65–99)
GLUCOSE-CAPILLARY: 194 mg/dL — AB (ref 65–99)
Glucose-Capillary: 164 mg/dL — ABNORMAL HIGH (ref 65–99)
Glucose-Capillary: 135 mg/dL — ABNORMAL HIGH (ref 65–99)
Glucose-Capillary: 142 mg/dL — ABNORMAL HIGH (ref 65–99)

## 2016-07-23 MED ORDER — SODIUM CHLORIDE 0.9 % IV SOLN
INTRAVENOUS | Status: AC
  Administered 2016-07-23 (×2): via INTRAVENOUS

## 2016-07-23 MED ORDER — FUROSEMIDE 10 MG/ML IJ SOLN
20.0000 mg | Freq: Two times a day (BID) | INTRAMUSCULAR | Status: DC
  Administered 2016-07-23: 20 mg via INTRAVENOUS
  Filled 2016-07-23 (×3): qty 2

## 2016-07-23 NOTE — Progress Notes (Signed)
Physical Therapy Treatment Patient Details Name: Natalie Friedman MRN: [id number] DOB: 1950-10-16 Today's Date: 07/23/2016    History of Present Illness Pt is s/p R TKA.  Protected weightbearing for 6 weeks and then we will brace the leg for an additional 6 weeks.    PT Comments    Pt received in bed.  She is much more alert today, and she is agreeable to PT tx.  She demonstrates bed mobility at mod (I) level.  She did have difficulty with sit<>stand from the bed today, and required Mod A to perform this task to be able to transfer bed<>BSC.  However, she only required min guard for sit<>stand from other surfaces.  She ambulated 76ft with RW inside the room today.     Follow Up Recommendations  Home health PT;Supervision - Intermittent;Other (comment) (possible SNF pending progress)     Equipment Recommendations       Recommendations for Other Services       Precautions / Restrictions Precautions Precautions: Fall Precaution Comments: Due to recent surgery and lethargy Restrictions RLE Weight Bearing: Weight bearing as tolerated Other Position/Activity Restrictions: After the first 6 weels we will brace the leg for an additional 6 weeks    Mobility  Bed Mobility Overal bed mobility: Modified Independent Bed Mobility: Supine to Sit     Supine to sit: Modified independent (Device/Increase time)     General bed mobility comments: increased time  Transfers Overall transfer level: Needs assistance Equipment used: Rolling walker (2 wheeled) Transfers: Sit to/from Stand Sit to Stand: Mod assist         General transfer comment: Pt had great difficulty with sit<>stand from low surface of the bed today, however, she had no issues when going sit<>stand from chair surface when she had arm rests to push from.  She was assisted from the bed<>BSC where she was able to urinate, however she had to perform 1/2 stand transfer to be able to relieve her bladder.  RN notified.    Ambulation/Gait Ambulation/Gait assistance: Min guard Ambulation Distance (Feet): 40 Feet Assistive device: Rolling walker (2 wheeled) Gait Pattern/deviations: Step-to pattern;Trunk flexed   Gait velocity interpretation: <1.8 ft/sec, indicative of risk for recurrent falls General Gait Details: Pt continues to demonstrate forward flexed posture,   Stairs            Wheelchair Mobility    Modified Rankin (Stroke Patients Only)       Balance   Sitting-balance support: Bilateral upper extremity supported;Feet supported Sitting balance-Leahy Scale: Good     Standing balance support: Bilateral upper extremity supported Standing balance-Leahy Scale: Fair                              Cognition Arousal/Alertness: Awake/alert Behavior During Therapy: WFL for tasks assessed/performed Overall Cognitive Status: Within Functional Limits for tasks assessed                                        Exercises Total Joint Exercises Ankle Circles/Pumps: AROM;Both;20 reps;Supine Quad Sets: Strengthening;Both;10 reps;Supine Short Arc Quad: Right;10 reps;Supine;AAROM;Other (comment);Limitations Short Arc Quad Limitations: Pt requires assist to get started.   Heel Slides: Strengthening;Right;10 reps;Supine;Limitations Heel Slides Limitations: with assistance to get started.  VC's to maintain LE alignment.     General Comments        Pertinent Vitals/Pain Faces Pain Scale:  Hurts little more Pain Location: right knee Pain Descriptors / Indicators: Aching Pain Intervention(s): Limited activity within patient's tolerance;Monitored during session;Repositioned    Home Living                      Prior Function            PT Goals (current goals can now be found in the care plan section) Acute Rehab PT Goals Patient Stated Goal: To get stronger PT Goal Formulation: With patient Time For Goal Achievement: 07/28/16 Potential to Achieve  Goals: Good Progress towards PT goals: Progressing toward goals    Frequency    BID      PT Plan Current plan remains appropriate    Co-evaluation             End of Session Equipment Utilized During Treatment: Gait belt;Oxygen Activity Tolerance: Patient limited by lethargy Patient left: in chair;with call bell/phone within reach Nurse Communication: Mobility status PT Visit Diagnosis: Muscle weakness (generalized) (M62.81);Other abnormalities of gait and mobility (R26.89);Pain Pain - Right/Left: Right Pain - part of body: Knee     Time: 7425-9563 PT Time Calculation (min) (ACUTE ONLY): 40 min  Charges:  $Gait Training: 8-22 mins $Therapeutic Exercise: 8-22 mins $Therapeutic Activity: 8-22 mins                    G Codes:       Beth Maiah Sinning, PT, DPT X: [phone]

## 2016-07-23 NOTE — Progress Notes (Signed)
Notified on call MD Ninfa Linden) upon shift side report nurses noticed patient lower anterior lateral thigh is starting to bleed where the drain was removed. Awaiting response.

## 2016-07-23 NOTE — Progress Notes (Signed)
Pt unable to void still. Bladder scanned patient and found to have 602 mL of urine in bladder. In and out cath patient per order and 550 mL out. Will continue to monitor patient.

## 2016-07-23 NOTE — Progress Notes (Signed)
Physical Therapy Treatment Patient Details Name: Phiona Ramnauth MRN: [id number] DOB: 12-31-1950 Today's Date: 07/23/2016    History of Present Illness Pt is s/p R TKA.  Protected weightbearing for 6 weeks and then we will brace the leg for an additional 6 weeks.    PT Comments    Pt received sitting up in the chair.  She required assistance to perform exercises in the chair, but demonstrates improved sit<>stand with RW and only requiring supervision.  She was able to ambulate 41ft with RW, however she continues to be limited due to pain, and decreased endurance.   Pt states they are delivering a hospital bed to the house.   Continue to recommend HHPT at this point.    Follow Up Recommendations  Home health PT;Supervision - Intermittent     Equipment Recommendations  Hospital bed    Recommendations for Other Services       Precautions / Restrictions Precautions Precautions: Fall Precaution Comments: Due to recent surgery Restrictions RLE Weight Bearing: Weight bearing as tolerated Other Position/Activity Restrictions: After the first 6 weels we will brace the leg for an additional 6 weeks    Mobility  Bed Mobility Overal bed mobility: Modified Independent Bed Mobility: Supine to Sit     Supine to sit: Modified independent (Device/Increase time)     General bed mobility comments: increased time  Transfers Overall transfer level: Needs assistance Equipment used: Rolling walker (2 wheeled) Transfers: Sit to/from Stand Sit to Stand: Supervision         General transfer comment: Pt had great difficulty with sit<>stand from low surface of the bed today, however, she had no issues when going sit<>stand from chair surface when she had arm rests to push from.  She was assisted from the bed<>BSC where she was able to urinate, however she had to perform 1/2 stand transfer to be able to relieve her bladder.  RN notified.   Ambulation/Gait Ambulation/Gait assistance: Min  guard Ambulation Distance (Feet): 40 Feet Assistive device: Rolling walker (2 wheeled) Gait Pattern/deviations: Step-to pattern;Trunk flexed   Gait velocity interpretation: <1.8 ft/sec, indicative of risk for recurrent falls General Gait Details: Poor R foot clearance and noted increased hip circumduction and lateral flexion of the trunk to compensate.     Stairs            Wheelchair Mobility    Modified Rankin (Stroke Patients Only)       Balance Overall balance assessment: Needs assistance Sitting-balance support: Bilateral upper extremity supported;Feet supported Sitting balance-Leahy Scale: Good     Standing balance support: Bilateral upper extremity supported Standing balance-Leahy Scale: Fair                              Cognition Arousal/Alertness: Awake/alert Behavior During Therapy: WFL for tasks assessed/performed Overall Cognitive Status: Within Functional Limits for tasks assessed                                        Exercises Total Joint Exercises Ankle Circles/Pumps: AROM;Both;20 reps;Supine Quad Sets: Strengthening;Both;10 reps;Supine Short Arc Quad: Right;10 reps;Supine;AAROM;Other (comment);Limitations Short Arc Quad Limitations: Pt requires assist to get started.   Heel Slides: Strengthening;Right;10 reps;Supine;Limitations Heel Slides Limitations: with assistance to get started.  VC's to maintain LE alignment.  Long Arc Quad: Strengthening;Both;10 reps;Seated;Limitations;AAROM Long [employer] Limitations: Poor activation of quad in R  LE - unable to complete full range and required assistance to complete exercise.      General Comments        Pertinent Vitals/Pain Faces Pain Scale: Hurts even more Pain Location: right knee Pain Descriptors / Indicators: Aching Pain Intervention(s): Limited activity within patient's tolerance;Monitored during session;Repositioned    Home Living                       Prior Function            PT Goals (current goals can now be found in the care plan section) Acute Rehab PT Goals Patient Stated Goal: To get stronger PT Goal Formulation: With patient Time For Goal Achievement: 07/28/16 Potential to Achieve Goals: Good Progress towards PT goals: Progressing toward goals    Frequency    BID      PT Plan Current plan remains appropriate    Co-evaluation             End of Session Equipment Utilized During Treatment: Gait belt;Oxygen Activity Tolerance: Patient tolerated treatment well Patient left: in chair;with call bell/phone within reach Nurse Communication: Mobility status PT Visit Diagnosis: Muscle weakness (generalized) (M62.81);Other abnormalities of gait and mobility (R26.89);Pain Pain - Right/Left: Right Pain - part of body: Knee     Time: 6720-9470 PT Time Calculation (min) (ACUTE ONLY): 23 min  Charges:  $Gait Training: 8-22 mins $Therapeutic Exercise: 8-22 mins $Therapeutic Activity: 8-22 mins                    G Codes:       Beth Alanda Colton, PT, DPT X: [phone]

## 2016-07-23 NOTE — Progress Notes (Addendum)
Patient ID: Natalie Friedman, female   DOB: [birth year], 66 y.o.   MRN: [id number] BP (!) 154/69 (BP Location: Right Arm)   Pulse 63   Temp 98.5 F (36.9 C) (Oral)   Resp 19   Ht 5\' 7"  (1.702 m)   Wt 281 lb (127.5 kg)   SpO2 95%   BMI 44.01 kg/m    Postop day 3 right total knee arthroplasty Urinary retention Opioid-induced lethargy Acute renal insufficiency Hyperkalemia   CBC Latest Ref Rng & Units 07/23/2016 07/22/2016 07/21/2016  WBC 4.0 - 10.5 K/uL 7.0 8.5 7.9  Hemoglobin 12.0 - 15.0 g/dL 11.1(L) 12.3 12.0  Hematocrit 36.0 - 46.0 % 35.7(L) 39.6 38.6  Platelets 150 - 400 K/uL 128(L) 127(L) 145(L)    BMP Latest Ref Rng & Units 07/23/2016 07/22/2016 07/21/2016  Glucose 65 - 99 mg/dL 161(H) 196(H) 118(H)  BUN 6 - 20 mg/dL 58(H) 49(H) 36(H)  Creatinine 0.44 - 1.00 mg/dL 2.71(H) 2.81(H) 1.99(H)  Sodium 135 - 145 mmol/L 138 136 138  Potassium 3.5 - 5.1 mmol/L 4.6 5.6(H) 4.2  Chloride 101 - 111 mmol/L 105 103 102  CO2 22 - 32 mmol/L 27 25 30   Calcium 8.9 - 10.3 mg/dL 8.3(L) 8.6(L) 8.3(L)    Lethargic yesterday: Improved  CR DOWN TO 2.71 FROM 2.81 Urinary retention also required in and out cath hyperkalemia is improved to 4.6 from 5.6 Renal insufficiency improving  The patient's lethargy has resolved with Narcan and decrease of opioid dose.  She had urinary retention yesterday had had in and out cath  She is amenable to getting up today and trying to walk and go to the bathroom  She has acute on chronic renal failure with improvement in creatinine on fluids and Lasix  Continue IV fluids and repeat dose of Lasix 2 today to rid her of ongoing fluid  Discharge held up for renal failure and lethargy, urinary retention, hyperkalemia

## 2016-07-24 ENCOUNTER — Encounter (HOSPITAL_COMMUNITY): Payer: Self-pay | Admitting: Orthopedic Surgery

## 2016-07-24 LAB — BASIC METABOLIC PANEL
Anion gap: 9 (ref 5–15)
BUN: 61 mg/dL — ABNORMAL HIGH (ref 6–20)
CHLORIDE: 103 mmol/L (ref 101–111)
CO2: 28 mmol/L (ref 22–32)
Calcium: 8.8 mg/dL — ABNORMAL LOW (ref 8.9–10.3)
Creatinine, Ser: 2.35 mg/dL — ABNORMAL HIGH (ref 0.44–1.00)
GFR calc Af Amer: 23 mL/min — ABNORMAL LOW (ref >60)
GFR calc non Af Amer: 20 mL/min — ABNORMAL LOW (ref >60)
GLUCOSE: 155 mg/dL — AB (ref 65–99)
Potassium: 4.1 mmol/L (ref 3.5–5.1)
Sodium: 140 mmol/L (ref 135–145)

## 2016-07-24 LAB — GLUCOSE, CAPILLARY
GLUCOSE-CAPILLARY: 180 mg/dL — AB (ref 65–99)
Glucose-Capillary: 139 mg/dL — ABNORMAL HIGH (ref 65–99)

## 2016-07-24 MED ORDER — GABAPENTIN 100 MG PO CAPS: 200.0000 mg | ORAL_CAPSULE | Freq: Three times a day (TID) | ORAL | 5 refills | Status: DC

## 2016-07-24 MED ORDER — HYDROCODONE-ACETAMINOPHEN 5-325 MG PO TABS: 1.0000 | ORAL_TABLET | ORAL | 0 refills | Status: DC | PRN

## 2016-07-24 MED ORDER — ASPIRIN 325 MG PO TBEC: 325.0000 mg | DELAYED_RELEASE_TABLET | Freq: Every day | ORAL | 0 refills | Status: DC

## 2016-07-24 MED ORDER — BISACODYL 5 MG PO TBEC: 5.0000 mg | DELAYED_RELEASE_TABLET | Freq: Every day | ORAL | 0 refills | Status: DC | PRN

## 2016-07-24 NOTE — Discharge Instructions (Signed)
Preventing Healthcare-Associated Infections A healthcare-associated infection is an illness caused by germs that enter the body while you receive health care. Germs may be passed through:  A medical procedure.  A medical device.  Contact with an infected person.  Contact with contaminated surfaces.  Breathing contaminated air.  Improper use of antibiotic medicines. Common types of healthcare-associated infections include:  Bloodstream infections from IV lines.  Bacterial infections that do not respond to usual antibiotics, such as methicillin-resistant Staphylococcus aureus (MRSA).  Urinary tract infections (UTIs) from urinary catheters.  Surgical wound infections.  Pneumonia from breathing tubes.  Lower digestive tract infection after taking antibiotics. Healthcare-associated infections can be serious, because germs in health care settings can be harder to treat than the germs that cause other types of infections. It is important to take steps to prevent these infections. What puts me at risk for a healthcare-associated infection? You come into contact with germs any time you receive care in a health care facility. You may be at higher risk for a healthcare-associated infection if:  Your bodys defense system (immune system) is weak.  You are older than age 72.  You smoke.  You have surgery or a procedure.  You have an IV tube in a blood vessel that goes close to your heart (central line) and the tube is in place for a long time.  You have a thin, flexible tube in your bladder (urinary catheter).  You have a breathing tube and you are on a breathing machine (ventilator).  You are prescribed the wrong antibiotic or an antibiotic that you do not need.  You need to take antibiotics for a long time.  You get treatment at a medical facility that does not follow good infection-control practices.  You need treatment in an intensive care unit (ICU) or long-term care  facility. What are some ways to prevent healthcare-associated infections? To reduce your chances of getting a healthcare-associated infection:  Ask your health care team what steps are being taken to prevent healthcare-associated infections.  Wash your hands often with soap and water. If soap and water are not available, use an alcohol-based hand sanitizer.  Make sure your health care providers and any visitors wash their hands before and after they enter your room.  If you need a central line, urinary catheter, or ventilator, ask how long you will need to have it.  If you are having surgery, ask if you should shower with an antibacterial soap on the morning or night before your surgery.  If you are prescribed an antibiotic:  Ask your health care provider if you should have testing to make sure it is the right antibiotic.  Ask if you really need the antibiotic. Antibiotics are not needed for colds or viral infections.  Take the medicine exactly as told by your health care provider.  Stay up to date on your immunizations. This includes getting a flu shot every year.  Do not smoke. If you do smoke, stop smoking before you have surgery or other procedures. Where to find more information:  Centers for Disease Control and Prevention: DirectoryExpo.ch  Association for Professionals in Infection Control and Epidemiology: http://consumers.site.[employer].cz Summary  A healthcare-associated infection is an illness caused by germs that can be passed to you while you receive medical care.  You may be at higher risk for this kind of infection if you have a medical procedure, IV tube, breathing tube, or urinary catheter.  When you receive medical care, ask your health care providers what  they are doing to prevent these infections.  Take steps to reduce your risk of this kind of infection. For instance, wash  your hands often and get a flu shot every year. This information is not intended to replace advice given to you by your health care provider. Make sure you discuss any questions you have with your health care provider. Document Released: 08/17/2015 Document Revised: 12/09/2015 Document Reviewed: 12/09/2015 Elsevier Interactive Patient Education  2017 [employer].

## 2016-07-24 NOTE — Care Management Important Message (Signed)
Important Message  Patient Details  Name: Natalie Friedman MRN: [id number] Date of Birth: 05/17/1950   Medicare Important Message Given:  Yes    Sherald Barge, RN 07/24/2016, 9:49 AM

## 2016-07-24 NOTE — Discharge Summary (Signed)
Physician Discharge Summary  Patient ID: Natalie Friedman MRN: [id number] DOB/AGE: 06/10/1950 67 y.o.  Admit date: 07/20/2016 Discharge date: 07/24/2016  Admission Diagnoses: Primary osteoarthritis right knee   Discharge Diagnoses:  Active Problems:   Primary osteoarthritis of right knee   Right total knee replacement Acute renal insufficiency Chronic heart failure Opioid induced somnolence post op Hyperkalemai Urinary retention  Discharged Condition: stable  Hospital Course:  HD 1  07/20/16 the patient underwent uncomplicated right total knee surgery under general anesthesia HD 2  07/21/16 the patient well-developed opioid-induced lethargy, treated with Narcan HD 3  07/22/16 acute renal insufficiency with elevation of creatinine treated with fluids HD 4  07/23/16 she increased her ability to walk and get in and out of bed HD 5  07/24/16 she was afebrile with a stable wound, her creatinine corrected to 2.2. Her hemoglobin was 11. She had good urine output.  CBC Latest Ref Rng & Units 07/23/2016 07/22/2016 07/21/2016  WBC 4.0 - 10.5 K/uL 7.0 8.5 7.9  Hemoglobin 12.0 - 15.0 g/dL 11.1(L) 12.3 12.0  Hematocrit 36.0 - 46.0 % 35.7(L) 39.6 38.6  Platelets 150 - 400 K/uL 128(L) 127(L) 145(L)   BMP Latest Ref Rng & Units 07/24/2016 07/23/2016 07/22/2016  Glucose 65 - 99 mg/dL 155(H) 161(H) 196(H)  BUN 6 - 20 mg/dL 61(H) 58(H) 49(H)  Creatinine 0.44 - 1.00 mg/dL 2.35(H) 2.71(H) 2.81(H)  Sodium 135 - 145 mmol/L 140 138 136  Potassium 3.5 - 5.1 mmol/L 4.1 4.6 5.6(H)  Chloride 101 - 111 mmol/L 103 105 103  CO2 22 - 32 mmol/L 28 27 25   Calcium 8.9 - 10.3 mg/dL 8.8(L) 8.3(L) 8.6(L)     Discharge Exam: Blood pressure 126/71, pulse 68, temperature 99.2 F (37.3 C), temperature source Oral, resp. rate (!) 30, height 5\' 7"  (1.702 m), weight 295 lb 1.6 oz (133.9 kg), SpO2 96 %.   Disposition: 01-Home or Self Care  Discharge Instructions    CPM    Complete by:  As directed    Continuous passive  motion machine (CPM):      Use the CPM from 0 to 80 for 6 hours per day.      You may increase by 10 per day.  You may break it up into 2 or 3 sessions per day.      Use CPM for 2 weeks or until you are told to stop.   Call MD / Call 911    Complete by:  As directed    If you experience chest pain or shortness of breath, CALL 911 and be transported to the hospital emergency room.  If you develope a fever above 101 F, pus (white drainage) or increased drainage or redness at the wound, or calf pain, call your surgeon's office.   Change dressing    Complete by:  As directed    Do not change main dressing   The smaller dressing change as needed   Constipation Prevention    Complete by:  As directed    Drink plenty of fluids.  Prune juice may be helpful.  You may use a stool softener, such as Colace (over the counter) 100 mg twice a day.  Use MiraLax (over the counter) for constipation as needed.   Diet - low sodium heart healthy    Complete by:  As directed    Discharge instructions    Complete by:  As directed    Drink 2 liters of fluid daily   Do not put a pillow  under the knee. Place it under the heel.    Complete by:  As directed    Increase activity slowly as tolerated    Complete by:  As directed    TED hose    Complete by:  As directed    Use stockings (TED hose) for 2 weeks on both leg(s).  You may remove them at night for sleeping.     Allergies as of 07/24/2016      Reactions   Corticosteroids Other (See Comments)   Hallucinations/swelling in legs & feet/passed out (ORAL ROUTE ONLY PER PATIENT)   Gluten Meal Other (See Comments)   Over time increases inflammation in body      Medication List    STOP taking these medications   acetaminophen-codeine 300-30 MG tablet Commonly known as:  TYLENOL #3     TAKE these medications   Alpha-Lipoic Acid 300 MG Caps Take 600 mg by mouth daily.   aspirin 325 MG EC tablet Take 1 tablet (325 mg total) by mouth daily with  breakfast. What changed:  medication strength  how much to take  when to take this   bisacodyl 5 MG EC tablet Commonly known as:  DULCOLAX Take 1 tablet (5 mg total) by mouth daily as needed for moderate constipation.   carvedilol 3.125 MG tablet Commonly known as:  COREG Take 1 tablet (3.125 mg total) by mouth 2 (two) times daily with a meal.   carvedilol 25 MG tablet Commonly known as:  COREG Take 25 mg by mouth 2 (two) times daily.   gabapentin 100 MG capsule Commonly known as:  NEURONTIN Take 2 capsules (200 mg total) by mouth 3 (three) times daily.   HYDROcodone-acetaminophen 5-325 MG tablet Commonly known as:  NORCO/VICODIN Take 1 tablet by mouth every 4 (four) hours as needed for moderate pain.   insulin detemir 100 UNIT/ML injection Commonly known as:  LEVEMIR Inject 0.1 mLs (10 Units total) into the skin at bedtime. What changed:  how much to take  when to take this   losartan 100 MG tablet Commonly known as:  COZAAR Take 1 tablet (100 mg total) by mouth daily.   MILK THISTLE SEED CR PO Take 1.33 mLs by mouth daily.   Omega 3 1000 MG Caps Take 1,000 mg by mouth daily.   OVER THE COUNTER MEDICATION Take 1 tablet by mouth 2 (two) times daily. Terry Naturally supplement:  Turmeric [place]ia Curcumin   potassium chloride 10 MEQ tablet Commonly known as:  K-DUR,KLOR-CON Take 1 tablet (10 mEq total) by mouth daily.   torsemide 20 MG tablet Commonly known as:  DEMADEX Take 2 tablets (40 mg total) by mouth daily. What changed:  when to take this   TRAUMEEL EX Apply 1 application topically 2 (two) times daily as needed (pain).   ARNICA [place] PO Take 1 tablet by mouth 2 (two) times daily as needed (pain).            Durable Medical Equipment        Start     Ordered   07/21/16 1222  For home use only DME Hospital bed  Once    Question Answer Comment  Patient has (list medical condition): total knee replacement   The above medical  condition requires: Patient requires the ability to reposition frequently   Head must be elevated greater than: 30 degrees   Bed type Semi-electric   Trapeze Bar Yes   Support Surface: Gel Overlay  07/21/16 1223   07/20/16 1256  For home use only DME 3 n 1  Once     07/20/16 1256   07/20/16 1256  For home use only DME Walker rolling  Once    Question:  Patient needs a walker to treat with the following condition  Answer:  Total knee replacement status   07/20/16 1256   07/20/16 1216  DME 3 n 1  Once    Comments:  Home use   07/20/16 1216   07/20/16 1216  DME Walker rolling  Once    Comments:  Home use  Question:  Patient needs a walker to treat with the following condition  Answer:  History of total right knee replacement   07/20/16 1216   07/20/16 1216  DME Bedside commode  Once    Comments:  Home use  Question:  Patient needs a bedside commode to treat with the following condition  Answer:  History of total right knee replacement   07/20/16 1216       Signed: Arther Abbott 07/24/2016, 8:14 AM

## 2016-07-24 NOTE — Care Management Note (Signed)
Case Management Note  Patient Details  Name: Natalie Friedman MRN: [id number] Date of Birth: Jun 27, 1950  Expected Discharge Date:  07/24/16               Expected Discharge Plan:  [place]  In-House Referral:  NA  Discharge planning Services  CM Consult  Post Acute Care Choice:  Home Health, Durable Medical Equipment Choice offered to:  Patient  DME Arranged:  Hospital bed, Continuous passive motion machine DME Agency:  [facility]  HH Arranged:  PT [place] Agency:  [facility]  Status of Service:  Completed, signed off  Additional Comments: Pt discharging home today. [facility] rep aware of DC. [place] aware of DC today and will meet pt at home once DC'd.   Sherald Barge, RN 07/24/2016, 10:15 AM

## 2016-07-25 ENCOUNTER — Telehealth: Payer: Self-pay | Admitting: Orthopedic Surgery

## 2016-07-25 NOTE — Telephone Encounter (Signed)
Patient wanted to ask Dr. Aline Brochure a question about Gabapentin medication. She had TKA on 07/20/16. Please call and advise.  [phone]

## 2016-07-25 NOTE — Telephone Encounter (Signed)
Patient is asking what she is taking the Gabapentin for, I reviewed all notes and could not find reason.

## 2016-07-25 NOTE — Telephone Encounter (Signed)
She was on it when she came in   So I dont know ????

## 2016-07-26 DIAGNOSIS — E1140 Type 2 diabetes mellitus with diabetic neuropathy, unspecified: Secondary | ICD-10-CM | POA: Diagnosis not present

## 2016-07-26 DIAGNOSIS — I509 Heart failure, unspecified: Secondary | ICD-10-CM | POA: Diagnosis not present

## 2016-07-26 DIAGNOSIS — Z79891 Long term (current) use of opiate analgesic: Secondary | ICD-10-CM | POA: Diagnosis not present

## 2016-07-26 DIAGNOSIS — Z794 Long term (current) use of insulin: Secondary | ICD-10-CM | POA: Diagnosis not present

## 2016-07-26 DIAGNOSIS — I110 Hypertensive heart disease with heart failure: Secondary | ICD-10-CM | POA: Diagnosis not present

## 2016-07-26 DIAGNOSIS — Z7982 Long term (current) use of aspirin: Secondary | ICD-10-CM | POA: Diagnosis not present

## 2016-07-26 DIAGNOSIS — E1121 Type 2 diabetes mellitus with diabetic nephropathy: Secondary | ICD-10-CM | POA: Diagnosis not present

## 2016-07-26 DIAGNOSIS — Z96651 Presence of right artificial knee joint: Secondary | ICD-10-CM | POA: Diagnosis not present

## 2016-07-26 DIAGNOSIS — Z471 Aftercare following joint replacement surgery: Secondary | ICD-10-CM | POA: Diagnosis not present

## 2016-07-26 LAB — TYPE AND SCREEN
ABO/RH(D): A POS
Antibody Screen: NEGATIVE
UNIT DIVISION: 0
Unit division: 0

## 2016-07-26 LAB — BPAM RBC
BLOOD PRODUCT EXPIRATION DATE: 201804272359
Blood Product Expiration Date: 201804262359
ISSUE DATE / TIME: 201804150924
Unit Type and Rh: 600
Unit Type and Rh: 600

## 2016-07-26 NOTE — Telephone Encounter (Signed)
I checked dispense history and only written by you. She also checked with her PCP. Any reason she would need it now? If not can she stop it?

## 2016-07-26 NOTE — Telephone Encounter (Signed)
Stop THE MED

## 2016-07-26 NOTE — Telephone Encounter (Signed)
STOP THE MED

## 2016-07-27 NOTE — Telephone Encounter (Signed)
Patient aware and she reports that she stopped taking gabapentin and pain medication due to some undesirable side affects and she is managing okay

## 2016-07-28 DIAGNOSIS — Z7982 Long term (current) use of aspirin: Secondary | ICD-10-CM | POA: Diagnosis not present

## 2016-07-28 DIAGNOSIS — Z79891 Long term (current) use of opiate analgesic: Secondary | ICD-10-CM | POA: Diagnosis not present

## 2016-07-28 DIAGNOSIS — Z96651 Presence of right artificial knee joint: Secondary | ICD-10-CM | POA: Diagnosis not present

## 2016-07-28 DIAGNOSIS — I110 Hypertensive heart disease with heart failure: Secondary | ICD-10-CM | POA: Diagnosis not present

## 2016-07-28 DIAGNOSIS — Z794 Long term (current) use of insulin: Secondary | ICD-10-CM | POA: Diagnosis not present

## 2016-07-28 DIAGNOSIS — I509 Heart failure, unspecified: Secondary | ICD-10-CM | POA: Diagnosis not present

## 2016-07-28 DIAGNOSIS — Z471 Aftercare following joint replacement surgery: Secondary | ICD-10-CM | POA: Diagnosis not present

## 2016-07-28 DIAGNOSIS — E1140 Type 2 diabetes mellitus with diabetic neuropathy, unspecified: Secondary | ICD-10-CM | POA: Diagnosis not present

## 2016-07-28 DIAGNOSIS — E1121 Type 2 diabetes mellitus with diabetic nephropathy: Secondary | ICD-10-CM | POA: Diagnosis not present

## 2016-07-31 DIAGNOSIS — I509 Heart failure, unspecified: Secondary | ICD-10-CM | POA: Diagnosis not present

## 2016-07-31 DIAGNOSIS — E1121 Type 2 diabetes mellitus with diabetic nephropathy: Secondary | ICD-10-CM | POA: Diagnosis not present

## 2016-07-31 DIAGNOSIS — Z96651 Presence of right artificial knee joint: Secondary | ICD-10-CM | POA: Diagnosis not present

## 2016-07-31 DIAGNOSIS — E1140 Type 2 diabetes mellitus with diabetic neuropathy, unspecified: Secondary | ICD-10-CM | POA: Diagnosis not present

## 2016-07-31 DIAGNOSIS — Z794 Long term (current) use of insulin: Secondary | ICD-10-CM | POA: Diagnosis not present

## 2016-07-31 DIAGNOSIS — Z7982 Long term (current) use of aspirin: Secondary | ICD-10-CM | POA: Diagnosis not present

## 2016-07-31 DIAGNOSIS — I110 Hypertensive heart disease with heart failure: Secondary | ICD-10-CM | POA: Diagnosis not present

## 2016-07-31 DIAGNOSIS — Z79891 Long term (current) use of opiate analgesic: Secondary | ICD-10-CM | POA: Diagnosis not present

## 2016-07-31 DIAGNOSIS — Z471 Aftercare following joint replacement surgery: Secondary | ICD-10-CM | POA: Diagnosis not present

## 2016-08-02 DIAGNOSIS — Z96651 Presence of right artificial knee joint: Secondary | ICD-10-CM | POA: Diagnosis not present

## 2016-08-02 DIAGNOSIS — Z471 Aftercare following joint replacement surgery: Secondary | ICD-10-CM | POA: Diagnosis not present

## 2016-08-02 DIAGNOSIS — Z7982 Long term (current) use of aspirin: Secondary | ICD-10-CM | POA: Diagnosis not present

## 2016-08-02 DIAGNOSIS — I509 Heart failure, unspecified: Secondary | ICD-10-CM | POA: Diagnosis not present

## 2016-08-02 DIAGNOSIS — Z79891 Long term (current) use of opiate analgesic: Secondary | ICD-10-CM | POA: Diagnosis not present

## 2016-08-02 DIAGNOSIS — E1121 Type 2 diabetes mellitus with diabetic nephropathy: Secondary | ICD-10-CM | POA: Diagnosis not present

## 2016-08-02 DIAGNOSIS — E1140 Type 2 diabetes mellitus with diabetic neuropathy, unspecified: Secondary | ICD-10-CM | POA: Diagnosis not present

## 2016-08-02 DIAGNOSIS — Z794 Long term (current) use of insulin: Secondary | ICD-10-CM | POA: Diagnosis not present

## 2016-08-02 DIAGNOSIS — I110 Hypertensive heart disease with heart failure: Secondary | ICD-10-CM | POA: Diagnosis not present

## 2016-08-03 NOTE — Telephone Encounter (Signed)
Noted per Dr Aline Brochure 07/26/16.

## 2016-08-04 ENCOUNTER — Ambulatory Visit (INDEPENDENT_AMBULATORY_CARE_PROVIDER_SITE_OTHER): Payer: Self-pay | Admitting: Orthopedic Surgery

## 2016-08-04 ENCOUNTER — Encounter: Payer: Self-pay | Admitting: Orthopedic Surgery

## 2016-08-04 DIAGNOSIS — Z7982 Long term (current) use of aspirin: Secondary | ICD-10-CM | POA: Diagnosis not present

## 2016-08-04 DIAGNOSIS — Z96651 Presence of right artificial knee joint: Secondary | ICD-10-CM | POA: Diagnosis not present

## 2016-08-04 DIAGNOSIS — I509 Heart failure, unspecified: Secondary | ICD-10-CM | POA: Diagnosis not present

## 2016-08-04 DIAGNOSIS — Z79891 Long term (current) use of opiate analgesic: Secondary | ICD-10-CM | POA: Diagnosis not present

## 2016-08-04 DIAGNOSIS — Z4889 Encounter for other specified surgical aftercare: Secondary | ICD-10-CM

## 2016-08-04 DIAGNOSIS — Z471 Aftercare following joint replacement surgery: Secondary | ICD-10-CM | POA: Diagnosis not present

## 2016-08-04 DIAGNOSIS — E1140 Type 2 diabetes mellitus with diabetic neuropathy, unspecified: Secondary | ICD-10-CM | POA: Diagnosis not present

## 2016-08-04 DIAGNOSIS — Z794 Long term (current) use of insulin: Secondary | ICD-10-CM | POA: Diagnosis not present

## 2016-08-04 DIAGNOSIS — E1121 Type 2 diabetes mellitus with diabetic nephropathy: Secondary | ICD-10-CM | POA: Diagnosis not present

## 2016-08-04 DIAGNOSIS — I110 Hypertensive heart disease with heart failure: Secondary | ICD-10-CM | POA: Diagnosis not present

## 2016-08-04 NOTE — Progress Notes (Signed)
Patient ID: Natalie Friedman, female   DOB: 1950/10/17, 66 y.o.   MRN: [id number]  Chief Complaint  Patient presents with  . Follow-up    POST OP RT TKA, DOS 07/20/16    HPI Natalie Friedman is a 66 y.o. female.  First postop visit after knee replacement postop day 15. She has no real problems at this point other than some nausea which is relieved by eating   Allergies  Allergen Reactions  . Corticosteroids Other (See Comments)    Hallucinations/swelling in legs & feet/passed out (ORAL ROUTE ONLY PER PATIENT)   . Gluten Meal Other (See Comments)    Over time increases inflammation in body    Current Outpatient Prescriptions  Medication Sig Dispense Refill  . Alpha-Lipoic Acid 300 MG CAPS Take 600 mg by mouth daily.    Marland Kitchen aspirin EC 325 MG EC tablet Take 1 tablet (325 mg total) by mouth daily with breakfast. 30 tablet 0  . bisacodyl (DULCOLAX) 5 MG EC tablet Take 1 tablet (5 mg total) by mouth daily as needed for moderate constipation. 30 tablet 0  . carvedilol (COREG) 25 MG tablet Take 25 mg by mouth 2 (two) times daily.  0  . Homeopathic Products (ARNICA [place] PO) Take 1 tablet by mouth 2 (two) times daily as needed (pain).    . Homeopathic Products (TRAUMEEL EX) Apply 1 application topically 2 (two) times daily as needed (pain).    . insulin detemir (LEVEMIR) 100 UNIT/ML injection Inject 0.1 mLs (10 Units total) into the skin at bedtime. (Patient taking differently: Inject 30 Units into the skin 2 (two) times daily. ) 10 mL 11  . losartan (COZAAR) 100 MG tablet Take 1 tablet (100 mg total) by mouth daily. 30 tablet 0  . MILK THISTLE SEED CR PO Take 1.33 mLs by mouth daily.    . Omega 3 1000 MG CAPS Take 1,000 mg by mouth daily.     Marland Kitchen OVER THE COUNTER MEDICATION Take 1 tablet by mouth 2 (two) times daily. Terry Naturally supplement:  Turmeric Boswellia Curcumin    . potassium chloride SA (K-DUR,KLOR-CON) 10 MEQ tablet Take 1 tablet (10 mEq total) by mouth daily. 30 tablet 0  .  torsemide (DEMADEX) 20 MG tablet Take 2 tablets (40 mg total) by mouth daily. (Patient taking differently: Take 40 mg by mouth every morning. ) 30 tablet 0  . carvedilol (COREG) 3.125 MG tablet Take 1 tablet (3.125 mg total) by mouth 2 (two) times daily with a meal. (Patient not taking: Reported on 07/11/2016) 60 tablet 0   No current facility-administered medications for this visit.       Physical Exam Physical Exam There were no vitals taken for this visit.  Appearance of incision: Incision is clean dry and intact we removed the staples  The calf was supple and the Homans sign was normal, there is minimal peripheral edema  Assessment and plan The patient is doing well and is in good condition  Follow-up will be 4 WEEKS  9:19 AM Arther Abbott, MD 08/04/2016

## 2016-08-07 DIAGNOSIS — Z96651 Presence of right artificial knee joint: Secondary | ICD-10-CM | POA: Diagnosis not present

## 2016-08-07 DIAGNOSIS — I110 Hypertensive heart disease with heart failure: Secondary | ICD-10-CM | POA: Diagnosis not present

## 2016-08-07 DIAGNOSIS — E1121 Type 2 diabetes mellitus with diabetic nephropathy: Secondary | ICD-10-CM | POA: Diagnosis not present

## 2016-08-07 DIAGNOSIS — Z471 Aftercare following joint replacement surgery: Secondary | ICD-10-CM | POA: Diagnosis not present

## 2016-08-07 DIAGNOSIS — E1140 Type 2 diabetes mellitus with diabetic neuropathy, unspecified: Secondary | ICD-10-CM | POA: Diagnosis not present

## 2016-08-07 DIAGNOSIS — Z794 Long term (current) use of insulin: Secondary | ICD-10-CM | POA: Diagnosis not present

## 2016-08-07 DIAGNOSIS — I509 Heart failure, unspecified: Secondary | ICD-10-CM | POA: Diagnosis not present

## 2016-08-07 DIAGNOSIS — Z79891 Long term (current) use of opiate analgesic: Secondary | ICD-10-CM | POA: Diagnosis not present

## 2016-08-07 DIAGNOSIS — Z7982 Long term (current) use of aspirin: Secondary | ICD-10-CM | POA: Diagnosis not present

## 2016-08-08 ENCOUNTER — Telehealth: Payer: Self-pay | Admitting: Orthopedic Surgery

## 2016-08-08 NOTE — Telephone Encounter (Signed)
Per call from [facility] care therapist, Anne Ng, direct ph# [phone], called to relay that she had visited patient for home therapy; states patient relayed that she had a "tumble" from a dining room chair.  Anne Ng states patient has no pain or soreness, states surgical site looks good and is intact; no complaints.  States being discharged from home therapy as scheduled, and is set up to start out-patient therapy next week.  I called patient to follow up - she states as noted that she is doing fine; aware she is to call if any questions or concerns.  (patient ph# [phone])

## 2016-08-09 DIAGNOSIS — E1140 Type 2 diabetes mellitus with diabetic neuropathy, unspecified: Secondary | ICD-10-CM | POA: Diagnosis not present

## 2016-08-09 DIAGNOSIS — Z96651 Presence of right artificial knee joint: Secondary | ICD-10-CM | POA: Diagnosis not present

## 2016-08-09 DIAGNOSIS — Z794 Long term (current) use of insulin: Secondary | ICD-10-CM | POA: Diagnosis not present

## 2016-08-09 DIAGNOSIS — E1121 Type 2 diabetes mellitus with diabetic nephropathy: Secondary | ICD-10-CM | POA: Diagnosis not present

## 2016-08-09 DIAGNOSIS — Z79891 Long term (current) use of opiate analgesic: Secondary | ICD-10-CM | POA: Diagnosis not present

## 2016-08-09 DIAGNOSIS — I110 Hypertensive heart disease with heart failure: Secondary | ICD-10-CM | POA: Diagnosis not present

## 2016-08-09 DIAGNOSIS — Z7982 Long term (current) use of aspirin: Secondary | ICD-10-CM | POA: Diagnosis not present

## 2016-08-09 DIAGNOSIS — I509 Heart failure, unspecified: Secondary | ICD-10-CM | POA: Diagnosis not present

## 2016-08-09 DIAGNOSIS — Z471 Aftercare following joint replacement surgery: Secondary | ICD-10-CM | POA: Diagnosis not present

## 2016-08-11 DIAGNOSIS — I110 Hypertensive heart disease with heart failure: Secondary | ICD-10-CM | POA: Diagnosis not present

## 2016-08-11 DIAGNOSIS — Z471 Aftercare following joint replacement surgery: Secondary | ICD-10-CM | POA: Diagnosis not present

## 2016-08-11 DIAGNOSIS — I509 Heart failure, unspecified: Secondary | ICD-10-CM | POA: Diagnosis not present

## 2016-08-11 DIAGNOSIS — Z79891 Long term (current) use of opiate analgesic: Secondary | ICD-10-CM | POA: Diagnosis not present

## 2016-08-11 DIAGNOSIS — E1140 Type 2 diabetes mellitus with diabetic neuropathy, unspecified: Secondary | ICD-10-CM | POA: Diagnosis not present

## 2016-08-11 DIAGNOSIS — E1121 Type 2 diabetes mellitus with diabetic nephropathy: Secondary | ICD-10-CM | POA: Diagnosis not present

## 2016-08-11 DIAGNOSIS — Z96651 Presence of right artificial knee joint: Secondary | ICD-10-CM | POA: Diagnosis not present

## 2016-08-11 DIAGNOSIS — Z794 Long term (current) use of insulin: Secondary | ICD-10-CM | POA: Diagnosis not present

## 2016-08-11 DIAGNOSIS — Z7982 Long term (current) use of aspirin: Secondary | ICD-10-CM | POA: Diagnosis not present

## 2016-08-12 DIAGNOSIS — R609 Edema, unspecified: Secondary | ICD-10-CM | POA: Diagnosis not present

## 2016-08-15 DIAGNOSIS — M62551 Muscle wasting and atrophy, not elsewhere classified, right thigh: Secondary | ICD-10-CM | POA: Diagnosis not present

## 2016-08-15 DIAGNOSIS — M25661 Stiffness of right knee, not elsewhere classified: Secondary | ICD-10-CM | POA: Diagnosis not present

## 2016-08-15 DIAGNOSIS — W010XXS Fall on same level from slipping, tripping and stumbling without subsequent striking against object, sequela: Secondary | ICD-10-CM | POA: Diagnosis not present

## 2016-08-15 DIAGNOSIS — M25561 Pain in right knee: Secondary | ICD-10-CM | POA: Diagnosis not present

## 2016-08-17 DIAGNOSIS — M25661 Stiffness of right knee, not elsewhere classified: Secondary | ICD-10-CM | POA: Diagnosis not present

## 2016-08-17 DIAGNOSIS — W010XXS Fall on same level from slipping, tripping and stumbling without subsequent striking against object, sequela: Secondary | ICD-10-CM | POA: Diagnosis not present

## 2016-08-17 DIAGNOSIS — M62551 Muscle wasting and atrophy, not elsewhere classified, right thigh: Secondary | ICD-10-CM | POA: Diagnosis not present

## 2016-08-17 DIAGNOSIS — M25561 Pain in right knee: Secondary | ICD-10-CM | POA: Diagnosis not present

## 2016-08-22 DIAGNOSIS — M25661 Stiffness of right knee, not elsewhere classified: Secondary | ICD-10-CM | POA: Diagnosis not present

## 2016-08-22 DIAGNOSIS — W010XXS Fall on same level from slipping, tripping and stumbling without subsequent striking against object, sequela: Secondary | ICD-10-CM | POA: Diagnosis not present

## 2016-08-22 DIAGNOSIS — M25561 Pain in right knee: Secondary | ICD-10-CM | POA: Diagnosis not present

## 2016-08-22 DIAGNOSIS — M62551 Muscle wasting and atrophy, not elsewhere classified, right thigh: Secondary | ICD-10-CM | POA: Diagnosis not present

## 2016-08-24 DIAGNOSIS — W010XXS Fall on same level from slipping, tripping and stumbling without subsequent striking against object, sequela: Secondary | ICD-10-CM | POA: Diagnosis not present

## 2016-08-24 DIAGNOSIS — M25561 Pain in right knee: Secondary | ICD-10-CM | POA: Diagnosis not present

## 2016-08-24 DIAGNOSIS — M62551 Muscle wasting and atrophy, not elsewhere classified, right thigh: Secondary | ICD-10-CM | POA: Diagnosis not present

## 2016-08-24 DIAGNOSIS — M25661 Stiffness of right knee, not elsewhere classified: Secondary | ICD-10-CM | POA: Diagnosis not present

## 2016-08-25 DIAGNOSIS — M25561 Pain in right knee: Secondary | ICD-10-CM | POA: Diagnosis not present

## 2016-08-25 DIAGNOSIS — M25661 Stiffness of right knee, not elsewhere classified: Secondary | ICD-10-CM | POA: Diagnosis not present

## 2016-08-25 DIAGNOSIS — W010XXS Fall on same level from slipping, tripping and stumbling without subsequent striking against object, sequela: Secondary | ICD-10-CM | POA: Diagnosis not present

## 2016-08-25 DIAGNOSIS — R600 Localized edema: Secondary | ICD-10-CM | POA: Diagnosis not present

## 2016-08-25 DIAGNOSIS — M62551 Muscle wasting and atrophy, not elsewhere classified, right thigh: Secondary | ICD-10-CM | POA: Diagnosis not present

## 2016-08-25 DIAGNOSIS — I10 Essential (primary) hypertension: Secondary | ICD-10-CM | POA: Diagnosis not present

## 2016-08-29 DIAGNOSIS — M25661 Stiffness of right knee, not elsewhere classified: Secondary | ICD-10-CM | POA: Diagnosis not present

## 2016-08-29 DIAGNOSIS — W010XXS Fall on same level from slipping, tripping and stumbling without subsequent striking against object, sequela: Secondary | ICD-10-CM | POA: Diagnosis not present

## 2016-08-29 DIAGNOSIS — M62551 Muscle wasting and atrophy, not elsewhere classified, right thigh: Secondary | ICD-10-CM | POA: Diagnosis not present

## 2016-08-29 DIAGNOSIS — M25561 Pain in right knee: Secondary | ICD-10-CM | POA: Diagnosis not present

## 2016-08-31 DIAGNOSIS — W010XXS Fall on same level from slipping, tripping and stumbling without subsequent striking against object, sequela: Secondary | ICD-10-CM | POA: Diagnosis not present

## 2016-08-31 DIAGNOSIS — M62551 Muscle wasting and atrophy, not elsewhere classified, right thigh: Secondary | ICD-10-CM | POA: Diagnosis not present

## 2016-08-31 DIAGNOSIS — M25661 Stiffness of right knee, not elsewhere classified: Secondary | ICD-10-CM | POA: Diagnosis not present

## 2016-08-31 DIAGNOSIS — M25561 Pain in right knee: Secondary | ICD-10-CM | POA: Diagnosis not present

## 2016-09-01 ENCOUNTER — Encounter: Payer: Self-pay | Admitting: Orthopedic Surgery

## 2016-09-01 ENCOUNTER — Ambulatory Visit (INDEPENDENT_AMBULATORY_CARE_PROVIDER_SITE_OTHER): Payer: Self-pay | Admitting: Orthopedic Surgery

## 2016-09-01 DIAGNOSIS — W010XXS Fall on same level from slipping, tripping and stumbling without subsequent striking against object, sequela: Secondary | ICD-10-CM | POA: Diagnosis not present

## 2016-09-01 DIAGNOSIS — M25661 Stiffness of right knee, not elsewhere classified: Secondary | ICD-10-CM | POA: Diagnosis not present

## 2016-09-01 DIAGNOSIS — M25561 Pain in right knee: Secondary | ICD-10-CM | POA: Diagnosis not present

## 2016-09-01 DIAGNOSIS — Z96651 Presence of right artificial knee joint: Secondary | ICD-10-CM

## 2016-09-01 DIAGNOSIS — Z4889 Encounter for other specified surgical aftercare: Secondary | ICD-10-CM

## 2016-09-01 DIAGNOSIS — M62551 Muscle wasting and atrophy, not elsewhere classified, right thigh: Secondary | ICD-10-CM | POA: Diagnosis not present

## 2016-09-01 NOTE — Progress Notes (Signed)
Chief Complaint  Patient presents with  . Follow-up    Recheck on right TKA, DOS 07-20-16.    Patient is 6 weeks out from her total knee. She is making gains at hand and we have with her flexion and extension. I measured her extension to be -5 today  She is in with her with a cane she is not on any pain medication she does take some herbal medications including Boswellia  Return 6 weeks

## 2016-09-05 DIAGNOSIS — M25561 Pain in right knee: Secondary | ICD-10-CM | POA: Diagnosis not present

## 2016-09-05 DIAGNOSIS — M25661 Stiffness of right knee, not elsewhere classified: Secondary | ICD-10-CM | POA: Diagnosis not present

## 2016-09-05 DIAGNOSIS — M62551 Muscle wasting and atrophy, not elsewhere classified, right thigh: Secondary | ICD-10-CM | POA: Diagnosis not present

## 2016-09-05 DIAGNOSIS — W010XXS Fall on same level from slipping, tripping and stumbling without subsequent striking against object, sequela: Secondary | ICD-10-CM | POA: Diagnosis not present

## 2016-09-07 DIAGNOSIS — M25661 Stiffness of right knee, not elsewhere classified: Secondary | ICD-10-CM | POA: Diagnosis not present

## 2016-09-07 DIAGNOSIS — M62551 Muscle wasting and atrophy, not elsewhere classified, right thigh: Secondary | ICD-10-CM | POA: Diagnosis not present

## 2016-09-07 DIAGNOSIS — M25561 Pain in right knee: Secondary | ICD-10-CM | POA: Diagnosis not present

## 2016-09-07 DIAGNOSIS — W010XXS Fall on same level from slipping, tripping and stumbling without subsequent striking against object, sequela: Secondary | ICD-10-CM | POA: Diagnosis not present

## 2016-09-14 DIAGNOSIS — W010XXS Fall on same level from slipping, tripping and stumbling without subsequent striking against object, sequela: Secondary | ICD-10-CM | POA: Diagnosis not present

## 2016-09-14 DIAGNOSIS — M62551 Muscle wasting and atrophy, not elsewhere classified, right thigh: Secondary | ICD-10-CM | POA: Diagnosis not present

## 2016-09-14 DIAGNOSIS — M25561 Pain in right knee: Secondary | ICD-10-CM | POA: Diagnosis not present

## 2016-09-14 DIAGNOSIS — M25661 Stiffness of right knee, not elsewhere classified: Secondary | ICD-10-CM | POA: Diagnosis not present

## 2016-09-15 DIAGNOSIS — M25561 Pain in right knee: Secondary | ICD-10-CM | POA: Diagnosis not present

## 2016-09-15 DIAGNOSIS — W010XXS Fall on same level from slipping, tripping and stumbling without subsequent striking against object, sequela: Secondary | ICD-10-CM | POA: Diagnosis not present

## 2016-09-15 DIAGNOSIS — M62551 Muscle wasting and atrophy, not elsewhere classified, right thigh: Secondary | ICD-10-CM | POA: Diagnosis not present

## 2016-09-15 DIAGNOSIS — M25661 Stiffness of right knee, not elsewhere classified: Secondary | ICD-10-CM | POA: Diagnosis not present

## 2016-09-19 DIAGNOSIS — M25661 Stiffness of right knee, not elsewhere classified: Secondary | ICD-10-CM | POA: Diagnosis not present

## 2016-09-19 DIAGNOSIS — W010XXS Fall on same level from slipping, tripping and stumbling without subsequent striking against object, sequela: Secondary | ICD-10-CM | POA: Diagnosis not present

## 2016-09-19 DIAGNOSIS — M62551 Muscle wasting and atrophy, not elsewhere classified, right thigh: Secondary | ICD-10-CM | POA: Diagnosis not present

## 2016-09-19 DIAGNOSIS — M25561 Pain in right knee: Secondary | ICD-10-CM | POA: Diagnosis not present

## 2016-09-21 DIAGNOSIS — M25661 Stiffness of right knee, not elsewhere classified: Secondary | ICD-10-CM | POA: Diagnosis not present

## 2016-09-21 DIAGNOSIS — M62551 Muscle wasting and atrophy, not elsewhere classified, right thigh: Secondary | ICD-10-CM | POA: Diagnosis not present

## 2016-09-21 DIAGNOSIS — W010XXS Fall on same level from slipping, tripping and stumbling without subsequent striking against object, sequela: Secondary | ICD-10-CM | POA: Diagnosis not present

## 2016-09-21 DIAGNOSIS — M25561 Pain in right knee: Secondary | ICD-10-CM | POA: Diagnosis not present

## 2016-09-25 ENCOUNTER — Encounter: Payer: Self-pay | Admitting: Orthopedic Surgery

## 2016-09-25 ENCOUNTER — Ambulatory Visit (INDEPENDENT_AMBULATORY_CARE_PROVIDER_SITE_OTHER): Payer: Medicare Other

## 2016-09-25 ENCOUNTER — Ambulatory Visit (INDEPENDENT_AMBULATORY_CARE_PROVIDER_SITE_OTHER): Payer: Self-pay | Admitting: Orthopedic Surgery

## 2016-09-25 VITALS — Temp 98.6°F

## 2016-09-25 DIAGNOSIS — M25561 Pain in right knee: Secondary | ICD-10-CM

## 2016-09-25 DIAGNOSIS — T8450XD Infection and inflammatory reaction due to unspecified internal joint prosthesis, subsequent encounter: Secondary | ICD-10-CM

## 2016-09-25 MED ORDER — DOXYCYCLINE HYCLATE 100 MG PO TABS: 100.0000 mg | ORAL_TABLET | Freq: Two times a day (BID) | ORAL | Status: DC

## 2016-09-25 MED ORDER — DOXYCYCLINE HYCLATE 100 MG PO CAPS: 100.0000 mg | ORAL_CAPSULE | Freq: Two times a day (BID) | ORAL | 0 refills | Status: DC

## 2016-09-25 NOTE — Progress Notes (Signed)
Postop. Follow-up visit  Chief Complaint  Patient presents with  . Wound Check    Rt TKA, WOUND PROBLEM, DOS 07/20/16     66 years old. She had a uncomplicated right total knee area she presents complaining of warmth and drainage from the right knee wound. It should be noted that she has a large leg with significant amount of preop swelling and peripheral edema which was chronic.  Her recent symptoms started last night. She used some type of herbal treatment to draw fluid and/or drainage out of the wound and it became a vesicle and then popped. Clear drainage. Yellow colored drainage is noted. Her range of motion has actually improved and she is not having any pain with range of motion just pain superficially over the mid to proximal portion of the surgical site.   Surgical incision with mid to upper portion surrounding erythema tenderness clear to yellow drainage. Knee flexion 0-95 without pain or rigidity  Patient walking without any discomfort  X-ray x-ray shows no abnormalities  Patient will have workup for infection although this looks superficial  I will start oral antibiotics  Meds ordered this encounter  Medications  . doxycycline (VIBRA-TABS) tablet 100 mg  . doxycycline (VIBRAMYCIN) 100 MG capsule    Sig: Take 1 capsule (100 mg total) by mouth 2 (two) times daily.    Dispense:  14 capsule    Refill:  0   1 week fu

## 2016-09-26 DIAGNOSIS — W010XXS Fall on same level from slipping, tripping and stumbling without subsequent striking against object, sequela: Secondary | ICD-10-CM | POA: Diagnosis not present

## 2016-09-26 DIAGNOSIS — M25561 Pain in right knee: Secondary | ICD-10-CM | POA: Diagnosis not present

## 2016-09-26 DIAGNOSIS — M25661 Stiffness of right knee, not elsewhere classified: Secondary | ICD-10-CM | POA: Diagnosis not present

## 2016-09-26 DIAGNOSIS — M62551 Muscle wasting and atrophy, not elsewhere classified, right thigh: Secondary | ICD-10-CM | POA: Diagnosis not present

## 2016-09-27 DIAGNOSIS — T8450XD Infection and inflammatory reaction due to unspecified internal joint prosthesis, subsequent encounter: Secondary | ICD-10-CM | POA: Diagnosis not present

## 2016-10-02 ENCOUNTER — Encounter: Payer: Self-pay | Admitting: Orthopedic Surgery

## 2016-10-02 ENCOUNTER — Telehealth: Payer: Self-pay | Admitting: Orthopedic Surgery

## 2016-10-02 ENCOUNTER — Encounter: Payer: Self-pay | Admitting: *Deleted

## 2016-10-02 ENCOUNTER — Ambulatory Visit (INDEPENDENT_AMBULATORY_CARE_PROVIDER_SITE_OTHER): Payer: Self-pay | Admitting: Orthopedic Surgery

## 2016-10-02 DIAGNOSIS — T814XXD Infection following a procedure, subsequent encounter: Secondary | ICD-10-CM

## 2016-10-02 DIAGNOSIS — IMO0001 Reserved for inherently not codable concepts without codable children: Secondary | ICD-10-CM

## 2016-10-02 DIAGNOSIS — Z96651 Presence of right artificial knee joint: Secondary | ICD-10-CM

## 2016-10-02 DIAGNOSIS — Z4889 Encounter for other specified surgical aftercare: Secondary | ICD-10-CM

## 2016-10-02 MED ORDER — DOXYCYCLINE HYCLATE 100 MG PO CAPS: 100.0000 mg | ORAL_CAPSULE | Freq: Two times a day (BID) | ORAL | 0 refills | Status: DC

## 2016-10-02 NOTE — Progress Notes (Signed)
Chief Complaint  Patient presents with  . Follow-up    SUPERFICIAL WOUND INFECTION RT TKA, DOS 07/20/16    The patient had a right total knee in April she came in on the 18th with a draining wound we put her on oral antibiotics and are for laboratory test. Her white count was 7.7 her sedimentation rate was 32 not elevated with 0-40 is normal but C-reactive protein of 112.9 which is high  She has continued draining and now the erythema has extended distally  Her knee remains nontender her flexion is 100 without pain  I told her in order to preserve the prosthesis that we should do an incision and drainage and aspirate the knee through a separate puncture site to check for intra-articular involvement  She is agreeable to surgery on the right knee incision and drainage. Plus aspiration  Encounter Diagnoses  Name Primary?  . S/P total knee arthroplasty, right Yes  . Aftercare following surgery   . Superficial postoperative wound infection, subsequent encounter    Meds ordered this encounter  Medications  . doxycycline (VIBRAMYCIN) 100 MG capsule    Sig: Take 1 capsule (100 mg total) by mouth 2 (two) times daily.    Dispense:  14 capsule    Refill:  0

## 2016-10-02 NOTE — Telephone Encounter (Signed)
[id number] 

## 2016-10-02 NOTE — Telephone Encounter (Signed)
BRIAN AT [place] NEEDS CPT CODE FOR THE PATIENT'S SURGERY  PLEASE CALL HIM AT [phone]

## 2016-10-02 NOTE — Progress Notes (Signed)
Per patient plan pre auth not required for cpt 27443 outpatient.  Reference [id number]

## 2016-10-02 NOTE — Patient Instructions (Signed)
Natalie Friedman  10/02/2016     @PREFPERIOPPHARMACY @   Your procedure is scheduled on  10/04/2016  Report to [facility] at  1210  P.M.  Call this number if you have problems the morning of surgery:  [phone]   Remember:  Do not eat food or drink liquids after midnight.  Take these medicines the morning of surgery with A SIP OF WATER  Coreg, cozaar. Take 1/2 of your usual insulin dosage the night before your surgery. DO NOT take any medication for diabetes the morning of your surgery.   Do not wear jewelry, make-up or nail polish.  Do not wear lotions, powders, or perfumes, or deoderant.  Do not shave 48 hours prior to surgery.  Men may shave face and neck.  Do not bring valuables to the hospital.  [facility] is not responsible for any belongings or valuables.  Contacts, dentures or bridgework may not be worn into surgery.  Leave your suitcase in the car.  After surgery it may be brought to your room.  For patients admitted to the hospital, discharge time will be determined by your treatment team.  Patients discharged the day of surgery will not be allowed to drive home.   Name and phone number of your driver:   family Special instructions:  None  Please read over the following fact sheets that you were given. Anesthesia Post-op Instructions and Care and Recovery After Surgery       Incision and Drainage Incision and drainage is a surgical procedure to open and drain a fluid-filled sac. The sac may be filled with pus, mucus, or blood. Examples of fluid-filled sacs that may need surgical drainage include cysts, skin infections (abscesses), and red lumps that develop from a ruptured cyst or a small abscess (boils). You may need this procedure if the affected area is large, painful, infected, or not healing well. Tell a health care provider about:  Any allergies you have.  All medicines you are taking, including vitamins, herbs, eye drops, creams, and  over-the-counter medicines.  Any problems you or family members have had with anesthetic medicines.  Any blood disorders you have.  Any surgeries you have had.  Any medical conditions you have.  Whether you are pregnant or may be pregnant. What are the risks? Generally, this is a safe procedure. However, problems may occur, including:  Infection.  Bleeding.  Allergic reactions to medicines.  Scarring.  What happens before the procedure?  You may need an ultrasound or other imaging tests to see how large or deep the fluid-filled sac is.  You may have blood tests to check for infection.  You may get a tetanus shot.  You may be given antibiotic medicine to help prevent infection.  Follow instructions from your health care provider about eating or drinking restrictions.  Ask your health care provider about: ? Changing or stopping your regular medicines. This is especially important if you are taking diabetes medicines or blood thinners. ? Taking medicines such as aspirin and ibuprofen. These medicines can thin your blood. Do not take these medicines before your procedure if your health care provider instructs you not to.  Plan to have someone take you home after the procedure.  If you will be going home right after the procedure, plan to have someone stay with you for 24 hours. What happens during the procedure?  To reduce your risk of infection: ? Your health care team will  wash or sanitize their hands. ? Your skin will be washed with soap.  You will be given one or more of the following: ? A medicine to help you relax (sedative). ? A medicine to numb the area (local anesthetic). ? A medicine to make you fall asleep (general anesthetic).  An incision will be made in the top of the fluid-filled sac.  The contents of the sac may be squeezed out, or a syringe or tube (catheter)may be used to empty the sac.  The catheter may be left in place for several weeks to  drain any fluid. Or, your health care provider may stitch open the edges of the incision to make a long-term opening for drainage (marsupialization).  The inside of the sac may be washed out (irrigated) with a sterile solution and packed with gauze before it is covered with a bandage (dressing). The procedure may vary among health care providers and hospitals. What happens after the procedure?  Your blood pressure, heart rate, breathing rate, and blood oxygen level will be monitored often until the medicines you were given have worn off.  Do not drive for 24 hours if you received a sedative. This information is not intended to replace advice given to you by your health care provider. Make sure you discuss any questions you have with your health care provider. Document Released: 09/20/2000 Document Revised: 09/02/2015 Document Reviewed: 01/15/2015 Elsevier Interactive Patient Education  2017 [place].  Incision and Drainage, Care After Refer to this sheet in the next few weeks. These instructions provide you with information about caring for yourself after your procedure. Your health care provider may also give you more specific instructions. Your treatment has been planned according to current medical practices, but problems sometimes occur. Call your health care provider if you have any problems or questions after your procedure. What can I expect after the procedure? After the procedure, it is common to have:  Pain or discomfort around your incision site.  Drainage from your incision.  Follow these instructions at home:  Take over-the-counter and prescription medicines only as told by your health care provider.  If you were prescribed an antibiotic medicine, take it as told by your health care provider.Do not stop taking the antibiotic even if you start to feel better.  Followinstructions from your health care provider about: ? How to take care of your incision. ? When and how  you should change your packing and bandage (dressing). Wash your hands with soap and water before you change your dressing. If soap and water are not available, use hand sanitizer. ? When you should remove your dressing.  Do not take baths, swim, or use a hot tub until your health care provider approves.  Keep all follow-up visits as told by your health care provider. This is important.  Check your incision area every day for signs of infection. Check for: ? More redness, swelling, or pain. ? More fluid or blood. ? Warmth. ? Pus or a bad smell. Contact a health care provider if:  Your cyst or abscess returns.  You have a fever.  You have more redness, swelling, or pain around your incision.  You have more fluid or blood coming from your incision.  Your incision feels warm to the touch.  You have pus or a bad smell coming from your incision. Get help right away if:  You have severe pain or bleeding.  You cannot eat or drink without vomiting.  You have decreased urine output.  You become short of breath.  You have chest pain.  You cough up blood.  The area where the incision and drainage occurred becomes numb or it tingles. This information is not intended to replace advice given to you by your health care provider. Make sure you discuss any questions you have with your health care provider. Document Released: 06/19/2011 Document Revised: 08/27/2015 Document Reviewed: 01/15/2015 Elsevier Interactive Patient Education  2017 [place] Anesthesia, Adult General anesthesia is the use of medicines to make a person "go to sleep" (be unconscious) for a medical procedure. General anesthesia is often recommended when a procedure:  Is long.  Requires you to be still or in an unusual position.  Is major and can cause you to lose blood.  Is impossible to do without general anesthesia.  The medicines used for general anesthesia are called general anesthetics. In  addition to making you sleep, the medicines:  Prevent pain.  Control your blood pressure.  Relax your muscles.  Tell a health care provider about:  Any allergies you have.  All medicines you are taking, including vitamins, herbs, eye drops, creams, and over-the-counter medicines.  Any problems you or family members have had with anesthetic medicines.  Types of anesthetics you have had in the past.  Any bleeding disorders you have.  Any surgeries you have had.  Any medical conditions you have.  Any history of heart or lung conditions, such as heart failure, sleep apnea, or chronic obstructive pulmonary disease (COPD).  Whether you are pregnant or may be pregnant.  Whether you use tobacco, alcohol, marijuana, or street drugs.  Any history of Armed forces logistics/support/administrative officer.  Any history of depression or anxiety. What are the risks? Generally, this is a safe procedure. However, problems may occur, including:  Allergic reaction to anesthetics.  Lung and heart problems.  Inhaling food or liquids from your stomach into your lungs (aspiration).  Injury to nerves.  Waking up during your procedure and being unable to move (rare).  Extreme agitation or a state of mental confusion (delirium) when you wake up from the anesthetic.  Air in the bloodstream, which can lead to stroke.  These problems are more likely to develop if you are having a major surgery or if you have an advanced medical condition. You can prevent some of these complications by answering all of your health care provider's questions thoroughly and by following all pre-procedure instructions. General anesthesia can cause side effects, including:  Nausea or vomiting  A sore throat from the breathing tube.  Feeling cold or shivery.  Feeling tired, washed out, or achy.  Sleepiness or drowsiness.  Confusion or agitation.  What happens before the procedure? Staying hydrated Follow instructions from your health care  provider about hydration, which may include:  Up to 2 hours before the procedure - you may continue to drink clear liquids, such as water, clear fruit juice, black coffee, and plain tea.  Eating and drinking restrictions Follow instructions from your health care provider about eating and drinking, which may include:  8 hours before the procedure - stop eating heavy meals or foods such as meat, fried foods, or fatty foods.  6 hours before the procedure - stop eating light meals or foods, such as toast or cereal.  6 hours before the procedure - stop drinking milk or drinks that contain milk.  2 hours before the procedure - stop drinking clear liquids.  Medicines  Ask your health care provider about: ? Changing or stopping  your regular medicines. This is especially important if you are taking diabetes medicines or blood thinners. ? Taking medicines such as aspirin and ibuprofen. These medicines can thin your blood. Do not take these medicines before your procedure if your health care provider instructs you not to. ? Taking new dietary supplements or medicines. Do not take these during the week before your procedure unless your health care provider approves them.  If you are told to take a medicine or to continue taking a medicine on the day of the procedure, take the medicine with sips of water. General instructions   Ask if you will be going home the same day, the following day, or after a longer hospital stay. ? Plan to have someone take you home. ? Plan to have someone stay with you for the first 24 hours after you leave the hospital or clinic.  For 3-6 weeks before the procedure, try not to use any tobacco products, such as cigarettes, chewing tobacco, and e-cigarettes.  You may brush your teeth on the morning of the procedure, but make sure to spit out the toothpaste. What happens during the procedure?  You will be given anesthetics through a mask and through an IV tube in one of  your veins.  You may receive medicine to help you relax (sedative).  As soon as you are asleep, a breathing tube may be used to help you breathe.  An anesthesia specialist will stay with you throughout the procedure. He or she will help keep you comfortable and safe by continuing to give you medicines and adjusting the amount of medicine that you get. He or she will also watch your blood pressure, pulse, and oxygen levels to make sure that the anesthetics do not cause any problems.  If a breathing tube was used to help you breathe, it will be removed before you wake up. The procedure may vary among health care providers and hospitals. What happens after the procedure?  You will wake up, often slowly, after the procedure is complete, usually in a recovery area.  Your blood pressure, heart rate, breathing rate, and blood oxygen level will be monitored until the medicines you were given have worn off.  You may be given medicine to help you calm down if you feel anxious or agitated.  If you will be going home the same day, your health care provider may check to make sure you can stand, drink, and urinate.  Your health care providers will treat your pain and side effects before you go home.  Do not drive for 24 hours if you received a sedative.  You may: ? Feel nauseous and vomit. ? Have a sore throat. ? Have mental slowness. ? Feel cold or shivery. ? Feel sleepy. ? Feel tired. ? Feel sore or achy, even in parts of your body where you did not have surgery. This information is not intended to replace advice given to you by your health care provider. Make sure you discuss any questions you have with your health care provider. Document Released: 07/04/2007 Document Revised: 09/07/2015 Document Reviewed: 03/11/2015 Elsevier Interactive Patient Education  2018 [place] Anesthesia, Adult, Care After These instructions provide you with information about caring for yourself after  your procedure. Your health care provider may also give you more specific instructions. Your treatment has been planned according to current medical practices, but problems sometimes occur. Call your health care provider if you have any problems or questions after your procedure. What can  I expect after the procedure? After the procedure, it is common to have:  Vomiting.  A sore throat.  Mental slowness.  It is common to feel:  Nauseous.  Cold or shivery.  Sleepy.  Tired.  Sore or achy, even in parts of your body where you did not have surgery.  Follow these instructions at home: For at least 24 hours after the procedure:  Do not: ? Participate in activities where you could fall or become injured. ? Drive. ? Use heavy machinery. ? Drink alcohol. ? Take sleeping pills or medicines that cause drowsiness. ? Make important decisions or sign legal documents. ? Take care of children on your own.  Rest. Eating and drinking  If you vomit, drink water, juice, or soup when you can drink without vomiting.  Drink enough fluid to keep your urine clear or pale yellow.  Make sure you have little or no nausea before eating solid foods.  Follow the diet recommended by your health care provider. General instructions  Have a responsible adult stay with you until you are awake and alert.  Return to your normal activities as told by your health care provider. Ask your health care provider what activities are safe for you.  Take over-the-counter and prescription medicines only as told by your health care provider.  If you smoke, do not smoke without supervision.  Keep all follow-up visits as told by your health care provider. This is important. Contact a health care provider if:  You continue to have nausea or vomiting at home, and medicines are not helpful.  You cannot drink fluids or start eating again.  You cannot urinate after 8-12 hours.  You develop a skin rash.  You  have fever.  You have increasing redness at the site of your procedure. Get help right away if:  You have difficulty breathing.  You have chest pain.  You have unexpected bleeding.  You feel that you are having a life-threatening or urgent problem. This information is not intended to replace advice given to you by your health care provider. Make sure you discuss any questions you have with your health care provider. Document Released: 07/03/2000 Document Revised: 08/30/2015 Document Reviewed: 03/11/2015 Elsevier Interactive Patient Education  [employer].

## 2016-10-03 ENCOUNTER — Encounter (HOSPITAL_COMMUNITY): Payer: Self-pay

## 2016-10-03 ENCOUNTER — Encounter (HOSPITAL_COMMUNITY)
Admission: RE | Admit: 2016-10-03 | Discharge: 2016-10-03 | Disposition: A | Discharge status: Home / Self Care | Payer: Medicare Other | Source: Ambulatory Visit | Attending: Orthopedic Surgery | Admitting: Orthopedic Surgery

## 2016-10-03 DIAGNOSIS — F329 Major depressive disorder, single episode, unspecified: Secondary | ICD-10-CM | POA: Diagnosis not present

## 2016-10-03 DIAGNOSIS — N189 Chronic kidney disease, unspecified: Secondary | ICD-10-CM | POA: Diagnosis not present

## 2016-10-03 DIAGNOSIS — I130 Hypertensive heart and chronic kidney disease with heart failure and stage 1 through stage 4 chronic kidney disease, or unspecified chronic kidney disease: Secondary | ICD-10-CM | POA: Diagnosis not present

## 2016-10-03 DIAGNOSIS — Z794 Long term (current) use of insulin: Secondary | ICD-10-CM | POA: Diagnosis not present

## 2016-10-03 DIAGNOSIS — I509 Heart failure, unspecified: Secondary | ICD-10-CM | POA: Diagnosis not present

## 2016-10-03 DIAGNOSIS — Z79899 Other long term (current) drug therapy: Secondary | ICD-10-CM | POA: Diagnosis not present

## 2016-10-03 DIAGNOSIS — F419 Anxiety disorder, unspecified: Secondary | ICD-10-CM | POA: Diagnosis not present

## 2016-10-03 DIAGNOSIS — E1122 Type 2 diabetes mellitus with diabetic chronic kidney disease: Secondary | ICD-10-CM | POA: Diagnosis not present

## 2016-10-03 DIAGNOSIS — L03115 Cellulitis of right lower limb: Secondary | ICD-10-CM | POA: Diagnosis not present

## 2016-10-03 HISTORY — DX: Depression, unspecified: F32.A

## 2016-10-03 HISTORY — DX: Major depressive disorder, single episode, unspecified: F32.9

## 2016-10-03 HISTORY — DX: Anxiety disorder, unspecified: F41.9

## 2016-10-03 HISTORY — DX: Chronic kidney disease, unspecified: N18.9

## 2016-10-03 LAB — BASIC METABOLIC PANEL
Anion gap: 11 (ref 5–15)
BUN: 35 mg/dL — AB (ref 6–20)
CHLORIDE: 98 mmol/L — AB (ref 101–111)
CO2: 32 mmol/L (ref 22–32)
CREATININE: 1.63 mg/dL — AB (ref 0.44–1.00)
Calcium: 9.5 mg/dL (ref 8.9–10.3)
GFR calc non Af Amer: 31 mL/min — ABNORMAL LOW (ref >60)
GFR, EST AFRICAN AMERICAN: 36 mL/min — AB (ref >60)
Glucose, Bld: 157 mg/dL — ABNORMAL HIGH (ref 65–99)
Potassium: 4.1 mmol/L (ref 3.5–5.1)
Sodium: 141 mmol/L (ref 135–145)

## 2016-10-03 LAB — SURGICAL PCR SCREEN
MRSA, PCR: NEGATIVE
Staphylococcus aureus: NEGATIVE

## 2016-10-03 NOTE — H&P (Signed)
Natalie Friedman is an 66 y.o. female.   Chief Complaint: Drainage right knee HPI: This is a 66 year old female who presents for surgery in her right knee. She had an unconjugated right total knee did well but about a week or so ago she started having drainage from the niece came into the office she was treated with oral antibiotics she did not improve.  She did not complain of joint pain but did complain of discomfort over the area of drainage. This progressed to include cellulitis of her lower leg and I advised that she have incision and drainage to preserve the knee prosthesis although at this point we do not feel that that is involved in the infection  Past Medical History:  Diagnosis Date  . Anxiety   . Arthritis   . CHF (congestive heart failure) ([place])   . Chronic kidney disease   . Depression   . Diabetes mellitus without complication ([place])   . Hypertension   . Neuropathy   . Occult spina bifida     Past Surgical History:  Procedure Laterality Date  . ectopic    . patella fx Right 07/2015  . TOTAL KNEE ARTHROPLASTY Right 07/20/2016   Procedure: TOTAL KNEE ARTHROPLASTY;  Surgeon: Natalie Civil, MD;  Location: [facility];  Service: Orthopedics;  Laterality: Right;    No family history on file. Social History:  reports that she has never smoked. She has never used smokeless tobacco. She reports that she does not drink alcohol or use drugs.  Allergies:  Allergies  Allergen Reactions  . Corticosteroids Other (See Comments)    Hallucinations/swelling in legs & feet/passed out (ORAL ROUTE ONLY PER PATIENT)   . Gluten Meal Other (See Comments)    Over time increases inflammation in body  . Oxycodone Other (See Comments)    TOO STRONG, RESPIRATORY DEPRESSION    No prescriptions prior to admission.    Results for orders placed or performed during the hospital encounter of 10/03/16 (from the past 48 hour(s))  Surgical pcr screen     Status: None   Collection Time: 10/03/16   9:56 AM  Result Value Ref Range   MRSA, PCR NEGATIVE NEGATIVE   Staphylococcus aureus NEGATIVE NEGATIVE    Comment:        The Xpert SA Assay (FDA approved for NASAL specimens in patients over 24 years of age), is one component of a comprehensive surveillance program.  Test performance has been validated by [facility] for patients greater than or equal to 16 year old. It is not intended to diagnose infection nor to guide or monitor treatment.   Basic metabolic panel     Status: Abnormal   Collection Time: 10/03/16  9:56 AM  Result Value Ref Range   Sodium 141 135 - 145 mmol/L   Potassium 4.1 3.5 - 5.1 mmol/L   Chloride 98 (L) 101 - 111 mmol/L   CO2 32 22 - 32 mmol/L   Glucose, Bld 157 (H) 65 - 99 mg/dL   BUN 35 (H) 6 - 20 mg/dL   Creatinine, Ser 1.63 (H) 0.44 - 1.00 mg/dL   Calcium 9.5 8.9 - 10.3 mg/dL   GFR calc non Af Amer 31 (L) >60 mL/min   GFR calc Af Amer 36 (L) >60 mL/min    Comment: (NOTE) The eGFR has been calculated using the CKD EPI equation. This calculation has not been validated in all clinical situations. eGFR's persistently <60 mL/min signify possible Chronic Kidney Disease.  Anion gap 11 5 - 15   No results found.  Review of Systems  Constitutional: Positive for fever. Negative for chills and malaise/fatigue.  Musculoskeletal: Positive for myalgias.  Neurological: Negative for tingling.  All other systems reviewed and are negative.   There were no vitals taken for this visit. Physical Exam  This lady is somewhat heavy echogram me endomorphic body habitus she joint at 3 her affect and mood are flat she is usually an a pleasant mood she does not appear toxic  She is ambulatory with intermittent use of assistive devices  In the midline lower third of her incision there is a purulent area with surrounding erythema that extends distally towards the leg and foot which is tender. Her knee flexion is 95-100 degrees her knee is stable she has no  tenderness along the joint superiorly inferiorly medially or laterally her motor function is normal skin as described distally she has no peripheral edema although chronically she does and sensation is normal  Her other extremities are aligned properly no subluxation of the joint no joint contractures no atrophy no skin lesions and normal sensation and pulses  Plain films of the knee replacement show no evidence of periosteal reaction loosening or infection   Diagnosis cellulitis abscess right leg status post right total knee  Plan is for incision and drainage and then through a second area aspiration and culture if any fluid found  Natalie Abbott, MD 10/03/2016, 5:08 PM

## 2016-10-04 ENCOUNTER — Encounter (HOSPITAL_COMMUNITY): Payer: Self-pay

## 2016-10-04 ENCOUNTER — Ambulatory Visit (HOSPITAL_COMMUNITY): Payer: Medicare Other | Admitting: Certified Registered"

## 2016-10-04 ENCOUNTER — Encounter (HOSPITAL_COMMUNITY)
Admission: RE | Disposition: A | Discharge status: Home / Self Care | Payer: Self-pay | Source: Ambulatory Visit | Attending: Orthopedic Surgery

## 2016-10-04 ENCOUNTER — Ambulatory Visit (HOSPITAL_COMMUNITY)
Admission: RE | Admit: 2016-10-04 | Discharge: 2016-10-04 | Disposition: A | Discharge status: Home / Self Care | Payer: Medicare Other | Source: Ambulatory Visit | Attending: Orthopedic Surgery | Admitting: Orthopedic Surgery

## 2016-10-04 DIAGNOSIS — I130 Hypertensive heart and chronic kidney disease with heart failure and stage 1 through stage 4 chronic kidney disease, or unspecified chronic kidney disease: Secondary | ICD-10-CM | POA: Insufficient documentation

## 2016-10-04 DIAGNOSIS — N189 Chronic kidney disease, unspecified: Secondary | ICD-10-CM | POA: Insufficient documentation

## 2016-10-04 DIAGNOSIS — Z794 Long term (current) use of insulin: Secondary | ICD-10-CM | POA: Diagnosis not present

## 2016-10-04 DIAGNOSIS — I509 Heart failure, unspecified: Secondary | ICD-10-CM | POA: Insufficient documentation

## 2016-10-04 DIAGNOSIS — F329 Major depressive disorder, single episode, unspecified: Secondary | ICD-10-CM | POA: Diagnosis not present

## 2016-10-04 DIAGNOSIS — E1122 Type 2 diabetes mellitus with diabetic chronic kidney disease: Secondary | ICD-10-CM | POA: Diagnosis not present

## 2016-10-04 DIAGNOSIS — L02415 Cutaneous abscess of right lower limb: Secondary | ICD-10-CM

## 2016-10-04 DIAGNOSIS — T8453XA Infection and inflammatory reaction due to internal right knee prosthesis, initial encounter: Secondary | ICD-10-CM | POA: Diagnosis not present

## 2016-10-04 DIAGNOSIS — L03115 Cellulitis of right lower limb: Secondary | ICD-10-CM | POA: Insufficient documentation

## 2016-10-04 DIAGNOSIS — Z79899 Other long term (current) drug therapy: Secondary | ICD-10-CM | POA: Diagnosis not present

## 2016-10-04 DIAGNOSIS — F419 Anxiety disorder, unspecified: Secondary | ICD-10-CM | POA: Diagnosis not present

## 2016-10-04 HISTORY — PX: INCISION AND DRAINAGE: SHX5863

## 2016-10-04 LAB — GLUCOSE, CAPILLARY
GLUCOSE-CAPILLARY: 113 mg/dL — AB (ref 65–99)
Glucose-Capillary: 119 mg/dL — ABNORMAL HIGH (ref 65–99)

## 2016-10-04 SURGERY — INCISION AND DRAINAGE
Anesthesia: General | Site: Knee | Laterality: Right

## 2016-10-04 MED ORDER — LIDOCAINE HCL (PF) 1 % IJ SOLN
INTRAMUSCULAR | Status: AC
Start: 2016-10-04 — End: 2016-10-04
  Filled 2016-10-04: qty 5

## 2016-10-04 MED ORDER — FENTANYL CITRATE (PF) 100 MCG/2ML IJ SOLN
INTRAMUSCULAR | Status: DC | PRN
Start: 2016-10-04 — End: 2016-10-04
  Administered 2016-10-04 (×2): 50 ug via INTRAVENOUS

## 2016-10-04 MED ORDER — PROPOFOL 10 MG/ML IV BOLUS
INTRAVENOUS | Status: AC
  Filled 2016-10-04: qty 20

## 2016-10-04 MED ORDER — SUCCINYLCHOLINE CHLORIDE 20 MG/ML IJ SOLN
INTRAMUSCULAR | Status: DC | PRN
  Administered 2016-10-04: 80 mg via INTRAVENOUS

## 2016-10-04 MED ORDER — GLYCOPYRROLATE 0.2 MG/ML IJ SOLN
INTRAMUSCULAR | Status: AC
  Filled 2016-10-04: qty 1

## 2016-10-04 MED ORDER — SUCCINYLCHOLINE CHLORIDE 20 MG/ML IJ SOLN
INTRAMUSCULAR | Status: AC
  Filled 2016-10-04: qty 1

## 2016-10-04 MED ORDER — ONDANSETRON 4 MG PO TBDP
4.0000 mg | ORAL_TABLET | Freq: Once | ORAL | Status: AC
  Administered 2016-10-04: 4 mg via ORAL

## 2016-10-04 MED ORDER — LACTATED RINGERS IV SOLN
INTRAVENOUS | Status: DC
  Administered 2016-10-04: 14:00:00 via INTRAVENOUS

## 2016-10-04 MED ORDER — GLYCOPYRROLATE 0.2 MG/ML IJ SOLN
INTRAMUSCULAR | Status: DC | PRN
  Administered 2016-10-04: 0.2 mg via INTRAVENOUS

## 2016-10-04 MED ORDER — ALBUTEROL SULFATE (2.5 MG/3ML) 0.083% IN NEBU
INHALATION_SOLUTION | RESPIRATORY_TRACT | Status: AC
  Filled 2016-10-04: qty 3

## 2016-10-04 MED ORDER — LIDOCAINE 2% (20 MG/ML) 5 ML SYRINGE
INTRAMUSCULAR | Status: DC | PRN
  Administered 2016-10-04: 40 mg via INTRAVENOUS

## 2016-10-04 MED ORDER — ONDANSETRON HCL 4 MG/2ML IJ SOLN
INTRAMUSCULAR | Status: AC
  Filled 2016-10-04: qty 2

## 2016-10-04 MED ORDER — HYDROMORPHONE HCL 1 MG/ML IJ SOLN: 0.2500 mg | INTRAMUSCULAR | Status: DC | PRN

## 2016-10-04 MED ORDER — CHLORHEXIDINE GLUCONATE 4 % EX LIQD: 60.0000 mL | Freq: Once | CUTANEOUS | Status: DC

## 2016-10-04 MED ORDER — MIDAZOLAM HCL 2 MG/2ML IJ SOLN
1.0000 mg | Freq: Once | INTRAMUSCULAR | Status: AC | PRN
  Administered 2016-10-04: 1 mg via INTRAVENOUS
  Filled 2016-10-04: qty 2

## 2016-10-04 MED ORDER — ALBUTEROL SULFATE (2.5 MG/3ML) 0.083% IN NEBU
2.5000 mg | INHALATION_SOLUTION | Freq: Once | RESPIRATORY_TRACT | Status: AC
  Administered 2016-10-04: 2.5 mg via RESPIRATORY_TRACT

## 2016-10-04 MED ORDER — LACTATED RINGERS IV SOLN: INTRAVENOUS | Status: DC

## 2016-10-04 MED ORDER — SODIUM CHLORIDE 0.9 % IR SOLN
Status: DC | PRN
  Administered 2016-10-04: 1000 mL

## 2016-10-04 MED ORDER — ONDANSETRON 4 MG PO TBDP
ORAL_TABLET | ORAL | Status: AC
  Filled 2016-10-04: qty 1

## 2016-10-04 MED ORDER — FENTANYL CITRATE (PF) 100 MCG/2ML IJ SOLN
25.0000 ug | INTRAMUSCULAR | Status: DC | PRN
Start: 2016-10-04 — End: 2016-10-04

## 2016-10-04 MED ORDER — HYDROCODONE-ACETAMINOPHEN 5-325 MG PO TABS: 1.0000 | ORAL_TABLET | ORAL | 0 refills | Status: DC | PRN

## 2016-10-04 MED ORDER — FENTANYL CITRATE (PF) 100 MCG/2ML IJ SOLN
INTRAMUSCULAR | Status: AC
  Filled 2016-10-04: qty 2

## 2016-10-04 MED ORDER — PROPOFOL 10 MG/ML IV BOLUS
INTRAVENOUS | Status: DC | PRN
Start: 2016-10-04 — End: 2016-10-04
  Administered 2016-10-04 (×2): 100 mg via INTRAVENOUS
  Administered 2016-10-04 (×2): 50 mg via INTRAVENOUS

## 2016-10-04 MED ORDER — CEFAZOLIN SODIUM-DEXTROSE 2-4 GM/100ML-% IV SOLN
2.0000 g | INTRAVENOUS | Status: AC
  Administered 2016-10-04: 2 g via INTRAVENOUS
  Filled 2016-10-04: qty 100

## 2016-10-04 SURGICAL SUPPLY — 35 items
BAG HAMPER (MISCELLANEOUS) ×3 IMPLANT
BANDAGE ACE 4 STERILE (GAUZE/BANDAGES/DRESSINGS) ×3 IMPLANT
BANDAGE ACE 6X5 VEL STRL LF (GAUZE/BANDAGES/DRESSINGS) ×6 IMPLANT
BANDAGE ELASTIC 3 VELCRO ST LF (GAUZE/BANDAGES/DRESSINGS) IMPLANT
BANDAGE ELASTIC 6 VELCRO ST LF (GAUZE/BANDAGES/DRESSINGS) IMPLANT
BANDAGE ESMARK 4X12 BL STRL LF (DISPOSABLE) ×1 IMPLANT
BNDG CONFORM 2 STRL LF (GAUZE/BANDAGES/DRESSINGS) IMPLANT
BNDG ESMARK 4X12 BLUE STRL LF (DISPOSABLE) ×3
BNDG GAUZE ELAST 4 BULKY (GAUZE/BANDAGES/DRESSINGS) ×3 IMPLANT
CLOTH BEACON ORANGE TIMEOUT ST (SAFETY) ×3 IMPLANT
COVER LIGHT HANDLE STERIS (MISCELLANEOUS) ×6 IMPLANT
CUFF TOURNIQUET SINGLE 34IN LL (TOURNIQUET CUFF) ×3 IMPLANT
DRSG ALLEVYN 2X2 (GAUZE/BANDAGES/DRESSINGS) IMPLANT
ELECT REM PT RETURN 9FT ADLT (ELECTROSURGICAL) ×3
ELECTRODE REM PT RTRN 9FT ADLT (ELECTROSURGICAL) ×1 IMPLANT
GAUZE SPONGE 4X4 12PLY STRL (GAUZE/BANDAGES/DRESSINGS) IMPLANT
GLOVE BIOGEL PI IND STRL 7.0 (GLOVE) ×1 IMPLANT
GLOVE BIOGEL PI INDICATOR 7.0 (GLOVE) ×2
GLOVE SKINSENSE NS SZ8.0 LF (GLOVE) ×2
GLOVE SKINSENSE STRL SZ8.0 LF (GLOVE) ×1 IMPLANT
GLOVE SS N UNI LF 8.5 STRL (GLOVE) ×3 IMPLANT
GOWN STRL REUS W/TWL LRG LVL3 (GOWN DISPOSABLE) ×9 IMPLANT
GOWN STRL REUS W/TWL XL LVL3 (GOWN DISPOSABLE) ×3 IMPLANT
HAND ALUMI LG (SOFTGOODS) ×3 IMPLANT
KIT ROOM TURNOVER APOR (KITS) ×3 IMPLANT
MANIFOLD NEPTUNE II (INSTRUMENTS) ×3 IMPLANT
NS IRRIG 1000ML POUR BTL (IV SOLUTION) ×3 IMPLANT
PACK BASIC LIMB (CUSTOM PROCEDURE TRAY) IMPLANT
PAD ABD 5X9 TENDERSORB (GAUZE/BANDAGES/DRESSINGS) IMPLANT
PAD ARMBOARD 7.5X6 YLW CONV (MISCELLANEOUS) ×3 IMPLANT
SET BASIN LINEN APH (SET/KITS/TRAYS/PACK) ×3 IMPLANT
SPONGE GAUZE 4X4 12PLY (GAUZE/BANDAGES/DRESSINGS) ×3 IMPLANT
SUT ETHILON 3 0 FSL (SUTURE) IMPLANT
SYR BULB IRRIGATION 50ML (SYRINGE) ×3 IMPLANT
VESSEL LOOPS MAXI RED (MISCELLANEOUS) IMPLANT

## 2016-10-04 NOTE — Transfer of Care (Signed)
Immediate Anesthesia Transfer of Care Note  Patient: Natalie Friedman  Procedure(s) Performed: Procedure(s) with comments: INCISION AND DRAINAGE AND ASPIRATION (Right) - right knee wound  Patient Location: PACU  Anesthesia Type:General  Level of Consciousness: awake, oriented, drowsy and patient cooperative  Airway & Oxygen Therapy: Patient Spontanous Breathing and Patient connected to face mask oxygen  Post-op Assessment: Report given to RN, Post -op Vital signs reviewed and stable and Patient moving all extremities X 4  Post vital signs: Reviewed and stable  Last Vitals:  Vitals:   10/04/16 1249  BP: (!) 170/94  Pulse: (!) 58  Resp: 16  Temp: 36.7 C    Last Pain:  Vitals:   10/04/16 1249  TempSrc: Oral         Complications: No apparent anesthesia complications

## 2016-10-04 NOTE — Anesthesia Procedure Notes (Signed)
Procedure Name: LMA Insertion Date/Time: 10/04/2016 2:54 PM Performed by: Lajuana Carry E Pre-anesthesia Checklist: Patient identified, Emergency Drugs available, Suction available, Patient being monitored and Timeout performed Patient Re-evaluated:Patient Re-evaluated prior to inductionOxygen Delivery Method: Circle system utilized Preoxygenation: Pre-oxygenation with 100% oxygen Intubation Type: IV induction Ventilation: Mask ventilation without difficulty LMA: LMA inserted LMA Size: 4.0 Number of attempts: 1 Dental Injury: Teeth and Oropharynx as per pre-operative assessment

## 2016-10-04 NOTE — Interval H&P Note (Signed)
History and Physical Interval Note:  10/04/2016 2:11 PM  Francisca Langenderfer  has presented today for surgery, with the diagnosis of RIGHT TOTAL KNEE WOUND INFECTION  The various methods of treatment have been discussed with the patient and family. After consideration of risks, benefits and other options for treatment, the patient has consented to  Procedure(s) with comments: INCISION AND DRAINAGE AND ASPIRATION (Right) - right knee wound as a surgical intervention .  The patient's history has been reviewed, patient examined, no change in status, stable for surgery.  I have reviewed the patient's chart and labs.  Questions were answered to the patient's satisfaction.     Arther Abbott

## 2016-10-04 NOTE — Anesthesia Preprocedure Evaluation (Addendum)
Anesthesia Evaluation  Patient identified by MRN, date of birth, ID band Patient awake    Airway Mallampati: II       Dental  (+) Teeth Intact   Pulmonary    Pulmonary exam normal breath sounds clear to auscultation       Cardiovascular Exercise Tolerance: Poor hypertension, Pt. on medications and Pt. on home beta blockers +CHF (EF 50-55%)   Rhythm:Regular Rate:Normal     Neuro/Psych PSYCHIATRIC DISORDERS Anxiety Depression    GI/Hepatic   Endo/Other  diabetes, Well Controlled, Type 2  Renal/GU CRF and Renal hypertensionRenal diseaseBun 30 Cr 1.6     Musculoskeletal  (+) Arthritis ,   Abdominal (+) + obese,   Peds  Hematology   Anesthesia Other Findings   Reproductive/Obstetrics                            Anesthesia Physical Anesthesia Plan  ASA: IV  Anesthesia Plan: General   Post-op Pain Management:    Induction: Intravenous  PONV Risk Score and Plan: Ondansetron  Airway Management Planned: LMA  Additional Equipment:   Intra-op Plan:   Post-operative Plan: Extubation in OR  Informed Consent: I have reviewed the patients History and Physical, chart, labs and discussed the procedure including the risks, benefits and alternatives for the proposed anesthesia with the patient or authorized representative who has indicated his/her understanding and acceptance.     Plan Discussed with: CRNA  Anesthesia Plan Comments:         Anesthesia Quick Evaluation

## 2016-10-04 NOTE — Op Note (Signed)
Surgical dictation and operative note  Today's date 10/04/2016 5:08 PM  Preop diagnosis superficial infection right knee  Postop diagnosis same  Procedure incision and drainage right knee wound and aspiration right knee joint  Surgeon Aline Brochure  Anesthesia Gen.  Operative findings: I aspirated the knee and got out 8 mL of clear fluid which was sent for culture. The actual wound itself was opened down to fascia there was no breach in the fascia there was a small amount of fat necrosis which was debrided. The incision was 5 cm long and 2 and half centimeters deep  Sponge and needle counts were correct  We sent  specimen  Gram stain and culture of fluid right knee    The patient was identified in the preop area and chart review was completed surgical site was confirmed and marked as right knee  The patient was taken to surgery where she had general anesthesia with intubation after failure of LMA Prep was performed with Betadine due to the draining wound  After sterile prep and drape and gloves were changed. The leg was exsanguinated with elevation tourniquet was elevated.  We aspirated the    knee superior to the patella on the lateral side I was able to aspirate 8 mL of clear fluid this was sent for culture and Gram stain  Gloves were changed again  I then made an incision over the area of drainage debrided the skin edges. I took the incision down to the underlying fascia inspected the area and found fat necrosis on both the medial and lateral side. This was treated with sharp debridement. We irrigated the wound. We then packed it with saline dressing wet over dry  She was extubated and taken to recovery in stable condition  She is on doxycycline and actually in the preop area said her knee was getting somewhat better with that medication and she was having less drainage

## 2016-10-04 NOTE — Brief Op Note (Signed)
10/04/2016  3:47 PM  PATIENT:  Natalie Friedman  66 y.o. female  PRE-OPERATIVE DIAGNOSIS:  RIGHT TOTAL KNEE WOUND INFECTION  POST-OPERATIVE DIAGNOSIS:  RIGHT TOTAL KNEE WOUND superficial INFECTION  PROCEDURE:  Procedure(s) with comments: INCISION AND DRAINAGE AND ASPIRATION (Right) - right knee wound  SURGEON:  Surgeon(s) and Role:    Carole Civil, MD - Primary  PHYSICIAN ASSISTANT:   ASSISTANTS: none   ANESTHESIA:   general  EBL:  No intake/output data recorded.  BLOOD ADMINISTERED:none  DRAINS: none   LOCAL MEDICATIONS USED:  NONE  SPECIMEN:  Source of Specimen:  knee culture of wound superficially and Aspirate  DISPOSITION OF SPECIMEN:  microbiology  COUNTS:  YES  TOURNIQUET:   Total Tourniquet Time Documented: Thigh (Right) - 15 minutes Total: Thigh (Right) - 15 minutes   DICTATION: .Viviann Spare Dictation  PLAN OF CARE: Discharge to home after PACU  PATIENT DISPOSITION:  PACU - hemodynamically stable.   Delay start of Pharmacological VTE agent (>24hrs) due to surgical blood loss or risk of bleeding: not applicable

## 2016-10-04 NOTE — Anesthesia Procedure Notes (Signed)
Procedure Name: Intubation Date/Time: 10/04/2016 3:10 PM Performed by: Lajuana Carry E Pre-anesthesia Checklist: Patient identified, Emergency Drugs available, Suction available and Patient being monitored Patient Re-evaluated:Patient Re-evaluated prior to inductionOxygen Delivery Method: Circle system utilized Preoxygenation: Pre-oxygenation with 100% oxygen Intubation Type: IV induction and Combination inhalational/ intravenous induction Ventilation: Mask ventilation without difficulty Laryngoscope Size: Glidescope and 3 Grade View: Grade I Tube type: Oral Tube size: 7.0 mm Number of attempts: 2 Airway Equipment and Method: Stylet and Video-laryngoscopy Placement Confirmation: ETT inserted through vocal cords under direct vision,  positive ETCO2 and breath sounds checked- equal and bilateral Secured at: 22 cm Tube secured with: Tape Dental Injury: Teeth and Oropharynx as per pre-operative assessment  Comments: Glidescope used after unable to maintain adequate airway with LMA. Cords viewed, ett placed after second attempt and anectine given.

## 2016-10-06 ENCOUNTER — Encounter (HOSPITAL_COMMUNITY): Payer: Self-pay | Admitting: Orthopedic Surgery

## 2016-10-06 ENCOUNTER — Ambulatory Visit (INDEPENDENT_AMBULATORY_CARE_PROVIDER_SITE_OTHER): Payer: Self-pay | Admitting: Orthopedic Surgery

## 2016-10-06 DIAGNOSIS — IMO0001 Reserved for inherently not codable concepts without codable children: Secondary | ICD-10-CM

## 2016-10-06 DIAGNOSIS — Z4889 Encounter for other specified surgical aftercare: Secondary | ICD-10-CM

## 2016-10-06 DIAGNOSIS — T814XXD Infection following a procedure, subsequent encounter: Secondary | ICD-10-CM

## 2016-10-06 NOTE — Patient Instructions (Addendum)
Change dressing Sunday   Continue antibiotic

## 2016-10-06 NOTE — Progress Notes (Signed)
Patient ID: Natalie Friedman, female   DOB: December 17, 1950, 66 y.o.   MRN: [id number]  POSTOP VISIT   Chief Complaint  Patient presents with  . Follow-up    Post op #1, on righ tknee, I&D, DOS 10-04-16.    This is a follow-up visit the patient had a right total knee and in 6 weeks later developed a wound infection. She had I and D. She is on doxycycline. Her knee aspiration has grown nothing at this point  Her superficial wound infection is being treated with doxycycline  Recommend every other day dressing changes  We change the packing today looks very good her erythema and cellulitis in the distal portion of leg have all but resolved.     Encounter Diagnoses  Name Primary?  Marland Kitchen Aftercare following surgery Yes  . Superficial postoperative wound infection, subsequent encounter       11:40 AM Arther Abbott, MD 10/06/2016

## 2016-10-09 LAB — AEROBIC/ANAEROBIC CULTURE (SURGICAL/DEEP WOUND)
CULTURE: NO GROWTH
GRAM STAIN: NONE SEEN

## 2016-10-09 LAB — AEROBIC/ANAEROBIC CULTURE W GRAM STAIN (SURGICAL/DEEP WOUND)

## 2016-10-09 NOTE — Anesthesia Postprocedure Evaluation (Signed)
Anesthesia Post Note  Patient: Natalie Friedman  Procedure(s) Performed: Procedure(s) (LRB): INCISION AND DRAINAGE AND ASPIRATION (Right)  Patient location during evaluation: PACU Anesthesia Type: General Level of consciousness: awake and alert Pain management: satisfactory to patient Vital Signs Assessment: post-procedure vital signs reviewed and stable Respiratory status: respiratory function stable Cardiovascular status: stable Postop Assessment: no signs of nausea or vomiting Anesthetic complications: no Comments: Late entry 10/09/2016 0850 T. Angalina Ante CRNA     Last Vitals:  Vitals:   10/04/16 1630 10/04/16 1639  BP: (!) 158/89 (!) 160/88  Pulse: 61 68  Resp: 17 16  Temp:  36.7 C    Last Pain:  Vitals:   10/05/16 1102  TempSrc:   PainSc: 2                  Prather Failla

## 2016-10-10 ENCOUNTER — Ambulatory Visit (INDEPENDENT_AMBULATORY_CARE_PROVIDER_SITE_OTHER): Payer: Self-pay | Admitting: Orthopedic Surgery

## 2016-10-10 ENCOUNTER — Encounter: Payer: Self-pay | Admitting: Orthopedic Surgery

## 2016-10-10 DIAGNOSIS — T814XXD Infection following a procedure, subsequent encounter: Secondary | ICD-10-CM

## 2016-10-10 DIAGNOSIS — IMO0001 Reserved for inherently not codable concepts without codable children: Secondary | ICD-10-CM

## 2016-10-10 DIAGNOSIS — Z96651 Presence of right artificial knee joint: Secondary | ICD-10-CM

## 2016-10-10 DIAGNOSIS — Z4889 Encounter for other specified surgical aftercare: Secondary | ICD-10-CM

## 2016-10-10 MED ORDER — DOXYCYCLINE HYCLATE 100 MG PO CAPS: 100.0000 mg | ORAL_CAPSULE | Freq: Two times a day (BID) | ORAL | 1 refills | Status: DC

## 2016-10-10 NOTE — Progress Notes (Signed)
Post op  Chief Complaint  Patient presents with  . Follow-up    RT TKA I&D, DOS 07/20/16, 10/04/16   Superficial wound infection right knee status post right total knee  Current wet-to-dry dressings. On doxycycline The patient is a clean wound bed 3 cm long  Refilled doxycycline  Change dressing every other day  Return Friday for wound check  Encounter Diagnoses  Name Primary?  Marland Kitchen Aftercare following surgery Yes  . Superficial postoperative wound infection, subsequent encounter   . S/P total knee arthroplasty, right

## 2016-10-13 ENCOUNTER — Ambulatory Visit (INDEPENDENT_AMBULATORY_CARE_PROVIDER_SITE_OTHER): Payer: Self-pay | Admitting: Orthopedic Surgery

## 2016-10-13 ENCOUNTER — Encounter: Payer: Self-pay | Admitting: Orthopedic Surgery

## 2016-10-13 DIAGNOSIS — T814XXD Infection following a procedure, subsequent encounter: Secondary | ICD-10-CM

## 2016-10-13 DIAGNOSIS — IMO0001 Reserved for inherently not codable concepts without codable children: Secondary | ICD-10-CM

## 2016-10-13 DIAGNOSIS — Z96651 Presence of right artificial knee joint: Secondary | ICD-10-CM

## 2016-10-13 DIAGNOSIS — Z4889 Encounter for other specified surgical aftercare: Secondary | ICD-10-CM

## 2016-10-13 NOTE — Patient Instructions (Signed)
Continue doxycycline and continue wet-to-dry dressing changes daily

## 2016-10-13 NOTE — Progress Notes (Signed)
POST OP   This patient had a routine primary right total knee did well. However about 6 weeks later she developed wound infection distally  We did an incision and drainage and aspiration of the knee. Aspiration of the knee was negative  Retreating her for cellulitis/infection right knee with wet-to-dry dressing changes in doxycycline  She's here for wound check  Her wound measures 4 x 2 x 1 length by with by depth  It is clean and granulating well  In the cellulitis of the lower leg is much better     Encounter Diagnoses  Name Primary?  Marland Kitchen Aftercare following surgery Yes  . Superficial postoperative wound infection, subsequent encounter   . S/P total knee arthroplasty, right     Continue dressing changes Continue doxycycline Follow-up in 2 weeks

## 2016-10-27 ENCOUNTER — Ambulatory Visit (INDEPENDENT_AMBULATORY_CARE_PROVIDER_SITE_OTHER): Payer: Self-pay | Admitting: Orthopedic Surgery

## 2016-10-27 DIAGNOSIS — T814XXD Infection following a procedure, subsequent encounter: Secondary | ICD-10-CM

## 2016-10-27 DIAGNOSIS — Z4889 Encounter for other specified surgical aftercare: Secondary | ICD-10-CM

## 2016-10-27 DIAGNOSIS — IMO0001 Reserved for inherently not codable concepts without codable children: Secondary | ICD-10-CM

## 2016-10-27 DIAGNOSIS — Z96651 Presence of right artificial knee joint: Secondary | ICD-10-CM

## 2016-10-27 NOTE — Progress Notes (Signed)
POST OP VISIT   RT TKA 4-12 I/D WOUND 6-27  THE JOINT HAS NO PAIN AND THE ROM IS FREE AND PAIN FREE.  SHE HAS PRETIBIAL EDEMA AND MILD ERYTHEMA  THE WOUND IS HEALTHY AT ITS BED AND DOING WELL    Current Outpatient Prescriptions:  .  Alpha-Lipoic Acid 300 MG CAPS, Take 600 mg by mouth daily., Disp: , Rfl:  .  aspirin EC 325 MG EC tablet, Take 1 tablet (325 mg total) by mouth daily with breakfast., Disp: 30 tablet, Rfl: 0 .  carvedilol (COREG) 25 MG tablet, Take 25 mg by mouth 2 (two) times daily., Disp: , Rfl: 0 .  doxycycline (VIBRAMYCIN) 100 MG capsule, Take 1 capsule (100 mg total) by mouth 2 (two) times daily., Disp: 60 capsule, Rfl: 1 .  Homeopathic Products (ARNICA [place] PO), Take 1 tablet by mouth 2 (two) times daily as needed (pain)., Disp: , Rfl:  .  Homeopathic Products (TRAUMEEL EX), Apply 1 application topically 2 (two) times daily as needed (pain)., Disp: , Rfl:  .  HYDROcodone-acetaminophen (NORCO/VICODIN) 5-325 MG tablet, Take 1 tablet by mouth every 4 (four) hours as needed for moderate pain., Disp: 30 tablet, Rfl: 0 .  insulin detemir (LEVEMIR) 100 UNIT/ML injection, Inject 0.1 mLs (10 Units total) into the skin at bedtime. (Patient taking differently: Inject 30 Units into the skin 2 (two) times daily. ), Disp: 10 mL, Rfl: 11 .  losartan (COZAAR) 100 MG tablet, Take 1 tablet (100 mg total) by mouth daily., Disp: 30 tablet, Rfl: 0 .  MILK THISTLE SEED CR PO, Take 1.33 mLs by mouth daily., Disp: , Rfl:  .  Omega 3 1000 MG CAPS, Take 1,000 mg by mouth daily. , Disp: , Rfl:  .  OVER THE COUNTER MEDICATION, Take 1 tablet by mouth 2 (two) times daily. Terry Naturally supplement:  Turmeric [place]wellia Curcumin, Disp: , Rfl:  .  potassium chloride SA (K-DUR,KLOR-CON) 10 MEQ tablet, Take 1 tablet (10 mEq total) by mouth daily. (Patient taking differently: Take 10 mEq by mouth daily as needed. ), Disp: 30 tablet, Rfl: 0 .  torsemide (DEMADEX) 20 MG tablet, Take 2 tablets (40 mg total)  by mouth daily. (Patient taking differently: Take 40 mg by mouth daily as needed (edema). ), Disp: 30 tablet, Rfl: 0  Current Facility-Administered Medications:  .  doxycycline (VIBRA-TABS) tablet 100 mg, 100 mg, Oral, Q12H, Carole Civil, MD  Encounter Diagnoses  Name Primary?  Marland Kitchen Aftercare following surgery Yes  . Superficial postoperative wound infection, subsequent encounter   . S/P total knee arthroplasty, right     CONTINUE WITH THE DRESSINGS AND ANTIBX AND FOLLOW IN 1 WEEK

## 2016-10-30 DIAGNOSIS — Z1159 Encounter for screening for other viral diseases: Secondary | ICD-10-CM | POA: Diagnosis not present

## 2016-10-30 DIAGNOSIS — I10 Essential (primary) hypertension: Secondary | ICD-10-CM | POA: Diagnosis not present

## 2016-10-30 DIAGNOSIS — E1129 Type 2 diabetes mellitus with other diabetic kidney complication: Secondary | ICD-10-CM | POA: Diagnosis not present

## 2016-11-03 ENCOUNTER — Ambulatory Visit (INDEPENDENT_AMBULATORY_CARE_PROVIDER_SITE_OTHER): Payer: Self-pay | Admitting: Orthopedic Surgery

## 2016-11-03 DIAGNOSIS — IMO0001 Reserved for inherently not codable concepts without codable children: Secondary | ICD-10-CM

## 2016-11-03 DIAGNOSIS — T814XXD Infection following a procedure, subsequent encounter: Secondary | ICD-10-CM

## 2016-11-03 DIAGNOSIS — Z4889 Encounter for other specified surgical aftercare: Secondary | ICD-10-CM

## 2016-11-03 DIAGNOSIS — Z96651 Presence of right artificial knee joint: Secondary | ICD-10-CM

## 2016-11-03 MED ORDER — DOXYCYCLINE HYCLATE 100 MG PO CAPS: 100.0000 mg | ORAL_CAPSULE | Freq: Two times a day (BID) | ORAL | 1 refills | Status: DC

## 2016-11-03 NOTE — Progress Notes (Signed)
Routine postop visit  Status post right total knee April 12 Incision and drainage superficial wound infection June 27  The patient continues to have good joint motion without pain  Her wound is now approximately 2 cm long 1 cm deep and maybe 2 cm wide  She is on doxycycline  Her wound is healing nicely  She will continue with the wet-to-dry dressing changes in the doxycycline and follow-up in 2 weeks

## 2016-11-13 DIAGNOSIS — I10 Essential (primary) hypertension: Secondary | ICD-10-CM | POA: Diagnosis not present

## 2016-11-13 DIAGNOSIS — Z0000 Encounter for general adult medical examination without abnormal findings: Secondary | ICD-10-CM | POA: Diagnosis not present

## 2016-11-13 DIAGNOSIS — N183 Chronic kidney disease, stage 3 (moderate): Secondary | ICD-10-CM | POA: Diagnosis not present

## 2016-11-13 DIAGNOSIS — E1129 Type 2 diabetes mellitus with other diabetic kidney complication: Secondary | ICD-10-CM | POA: Diagnosis not present

## 2016-11-13 DIAGNOSIS — E782 Mixed hyperlipidemia: Secondary | ICD-10-CM | POA: Diagnosis not present

## 2016-11-13 NOTE — Telephone Encounter (Signed)
Sign off

## 2016-11-17 ENCOUNTER — Ambulatory Visit (INDEPENDENT_AMBULATORY_CARE_PROVIDER_SITE_OTHER): Payer: Medicare Other | Admitting: Orthopedic Surgery

## 2016-11-17 DIAGNOSIS — IMO0001 Reserved for inherently not codable concepts without codable children: Secondary | ICD-10-CM

## 2016-11-17 DIAGNOSIS — Z4889 Encounter for other specified surgical aftercare: Secondary | ICD-10-CM

## 2016-11-17 DIAGNOSIS — T814XXD Infection following a procedure, subsequent encounter: Secondary | ICD-10-CM

## 2016-11-17 DIAGNOSIS — Z96651 Presence of right artificial knee joint: Secondary | ICD-10-CM

## 2016-11-17 NOTE — Progress Notes (Signed)
Routine postop visit  Encounter Diagnoses  Name Primary?  Marland Kitchen Aftercare following surgery Yes  . Superficial postoperative wound infection, subsequent encounter   . S/P total knee arthroplasty, right     May 12 the patient had a right total knee  June 27 she had an incision and drainage of superficial wound infection  The wound has closed up nicely it is approximately 0.5 in depth by 1.5 cm in length by 0.7 cm in width  It has a clean granulation bed with no drainage  Continue oral antibiotics and wound care follow-up 3 weeks

## 2016-12-06 DIAGNOSIS — R809 Proteinuria, unspecified: Secondary | ICD-10-CM | POA: Diagnosis not present

## 2016-12-06 DIAGNOSIS — I10 Essential (primary) hypertension: Secondary | ICD-10-CM | POA: Diagnosis not present

## 2016-12-06 DIAGNOSIS — N183 Chronic kidney disease, stage 3 (moderate): Secondary | ICD-10-CM | POA: Diagnosis not present

## 2016-12-06 DIAGNOSIS — E1129 Type 2 diabetes mellitus with other diabetic kidney complication: Secondary | ICD-10-CM | POA: Diagnosis not present

## 2016-12-08 ENCOUNTER — Ambulatory Visit (INDEPENDENT_AMBULATORY_CARE_PROVIDER_SITE_OTHER): Payer: Medicare Other | Admitting: Orthopedic Surgery

## 2016-12-08 DIAGNOSIS — Z4889 Encounter for other specified surgical aftercare: Secondary | ICD-10-CM

## 2016-12-08 DIAGNOSIS — Z96651 Presence of right artificial knee joint: Secondary | ICD-10-CM

## 2016-12-08 DIAGNOSIS — T814XXD Infection following a procedure, subsequent encounter: Secondary | ICD-10-CM

## 2016-12-08 DIAGNOSIS — IMO0001 Reserved for inherently not codable concepts without codable children: Secondary | ICD-10-CM

## 2016-12-08 NOTE — Patient Instructions (Signed)
Stop antibiotics Use dressing when needed for soiled areas  Bike is appropriate and okay at this point

## 2016-12-08 NOTE — Progress Notes (Signed)
Postop visit  The patient had a right total knee on May 12 she developed a postoperative superficial infection had incision drainage on June 27  She was kept on oral antibiotics and dressing changes  The wound has closed  She also has chronic venous stasis disease and peripheral edema which currently is stable  Today she has an involuted contracted wound from the incision and drainage area the rest of the leg looks normal except for chronic peripheral edema she has minimal distal erythema  We will see her in 3 months as long as there are no problems  Encounter Diagnoses  Name Primary?  Marland Kitchen Aftercare following surgery Yes  . Superficial postoperative wound infection, subsequent encounter   . S/P total knee arthroplasty, right     The patient can stop her antibiotics, she can go on an exercise bike.

## 2017-01-03 ENCOUNTER — Telehealth: Payer: Self-pay | Admitting: *Deleted

## 2017-01-03 NOTE — Telephone Encounter (Signed)
Left message for patient to return my call.

## 2017-01-03 NOTE — Telephone Encounter (Signed)
Patient called stating she wants a referral to the physical therapist that is down stairs in our building. [facility]. Please advise

## 2017-01-03 NOTE — Telephone Encounter (Signed)
Patient is requesting an order to do physical therapy.. States she needs a little help with strengthening.Can we order PT once a week?[facility]atalie [facility] PT and hand specialist)

## 2017-01-03 NOTE — Telephone Encounter (Signed)
Hand specialist??  PT for knee yes

## 2017-01-04 ENCOUNTER — Other Ambulatory Visit: Payer: Self-pay | Admitting: *Deleted

## 2017-01-04 DIAGNOSIS — Z96651 Presence of right artificial knee joint: Secondary | ICD-10-CM

## 2017-01-04 NOTE — Telephone Encounter (Signed)
Faxed order to hand and rehab [place]

## 2017-01-11 DIAGNOSIS — M25561 Pain in right knee: Secondary | ICD-10-CM | POA: Diagnosis not present

## 2017-01-11 DIAGNOSIS — M25661 Stiffness of right knee, not elsewhere classified: Secondary | ICD-10-CM | POA: Diagnosis not present

## 2017-01-11 DIAGNOSIS — M62551 Muscle wasting and atrophy, not elsewhere classified, right thigh: Secondary | ICD-10-CM | POA: Diagnosis not present

## 2017-01-11 DIAGNOSIS — Z96651 Presence of right artificial knee joint: Secondary | ICD-10-CM | POA: Diagnosis not present

## 2017-01-15 DIAGNOSIS — M25661 Stiffness of right knee, not elsewhere classified: Secondary | ICD-10-CM | POA: Diagnosis not present

## 2017-01-15 DIAGNOSIS — M62551 Muscle wasting and atrophy, not elsewhere classified, right thigh: Secondary | ICD-10-CM | POA: Diagnosis not present

## 2017-01-15 DIAGNOSIS — Z96651 Presence of right artificial knee joint: Secondary | ICD-10-CM | POA: Diagnosis not present

## 2017-01-15 DIAGNOSIS — M25561 Pain in right knee: Secondary | ICD-10-CM | POA: Diagnosis not present

## 2017-01-25 DIAGNOSIS — Z96651 Presence of right artificial knee joint: Secondary | ICD-10-CM | POA: Diagnosis not present

## 2017-01-25 DIAGNOSIS — M25561 Pain in right knee: Secondary | ICD-10-CM | POA: Diagnosis not present

## 2017-01-25 DIAGNOSIS — M25661 Stiffness of right knee, not elsewhere classified: Secondary | ICD-10-CM | POA: Diagnosis not present

## 2017-01-25 DIAGNOSIS — M62551 Muscle wasting and atrophy, not elsewhere classified, right thigh: Secondary | ICD-10-CM | POA: Diagnosis not present

## 2017-01-29 DIAGNOSIS — M62551 Muscle wasting and atrophy, not elsewhere classified, right thigh: Secondary | ICD-10-CM | POA: Diagnosis not present

## 2017-01-29 DIAGNOSIS — Z96651 Presence of right artificial knee joint: Secondary | ICD-10-CM | POA: Diagnosis not present

## 2017-01-29 DIAGNOSIS — M25661 Stiffness of right knee, not elsewhere classified: Secondary | ICD-10-CM | POA: Diagnosis not present

## 2017-01-29 DIAGNOSIS — M25561 Pain in right knee: Secondary | ICD-10-CM | POA: Diagnosis not present

## 2017-01-31 DIAGNOSIS — Z96651 Presence of right artificial knee joint: Secondary | ICD-10-CM | POA: Diagnosis not present

## 2017-01-31 DIAGNOSIS — M25661 Stiffness of right knee, not elsewhere classified: Secondary | ICD-10-CM | POA: Diagnosis not present

## 2017-01-31 DIAGNOSIS — M25561 Pain in right knee: Secondary | ICD-10-CM | POA: Diagnosis not present

## 2017-01-31 DIAGNOSIS — M62551 Muscle wasting and atrophy, not elsewhere classified, right thigh: Secondary | ICD-10-CM | POA: Diagnosis not present

## 2017-02-12 DIAGNOSIS — M25661 Stiffness of right knee, not elsewhere classified: Secondary | ICD-10-CM | POA: Diagnosis not present

## 2017-02-12 DIAGNOSIS — M62551 Muscle wasting and atrophy, not elsewhere classified, right thigh: Secondary | ICD-10-CM | POA: Diagnosis not present

## 2017-02-12 DIAGNOSIS — Z96651 Presence of right artificial knee joint: Secondary | ICD-10-CM | POA: Diagnosis not present

## 2017-02-12 DIAGNOSIS — M25561 Pain in right knee: Secondary | ICD-10-CM | POA: Diagnosis not present

## 2017-02-19 DIAGNOSIS — Z96651 Presence of right artificial knee joint: Secondary | ICD-10-CM | POA: Diagnosis not present

## 2017-02-19 DIAGNOSIS — M25561 Pain in right knee: Secondary | ICD-10-CM | POA: Diagnosis not present

## 2017-02-19 DIAGNOSIS — M62551 Muscle wasting and atrophy, not elsewhere classified, right thigh: Secondary | ICD-10-CM | POA: Diagnosis not present

## 2017-02-19 DIAGNOSIS — M25661 Stiffness of right knee, not elsewhere classified: Secondary | ICD-10-CM | POA: Diagnosis not present

## 2017-03-09 ENCOUNTER — Ambulatory Visit (INDEPENDENT_AMBULATORY_CARE_PROVIDER_SITE_OTHER): Payer: Medicare Other

## 2017-03-09 ENCOUNTER — Encounter: Payer: Self-pay | Admitting: Orthopedic Surgery

## 2017-03-09 ENCOUNTER — Ambulatory Visit (INDEPENDENT_AMBULATORY_CARE_PROVIDER_SITE_OTHER): Payer: Medicare Other | Admitting: Orthopedic Surgery

## 2017-03-09 VITALS — BP 214/102 | HR 86 | Ht 67.0 in | Wt 295.0 lb

## 2017-03-09 DIAGNOSIS — Z96651 Presence of right artificial knee joint: Secondary | ICD-10-CM

## 2017-03-09 DIAGNOSIS — M5440 Lumbago with sciatica, unspecified side: Secondary | ICD-10-CM | POA: Diagnosis not present

## 2017-03-09 DIAGNOSIS — M4316 Spondylolisthesis, lumbar region: Secondary | ICD-10-CM | POA: Diagnosis not present

## 2017-03-09 NOTE — Progress Notes (Signed)
Progress Note   Patient ID: Natalie Friedman, female   DOB: Jul 10, 1950, 66 y.o.   MRN: [id number]  Chief Complaint  Patient presents with  . Difficulty Walking    states right knee feels okay, surgery 07/20/16 I and D 10/04/16 but has trouble walking     66 year old female had a right total knee in April 2018 comp gated by superficial wound infection treated with incision and drainage and wound care on October 04, 2016.  She was advised to go to physical therapy which she did however after 3 weeks of physical therapy her gait became even more labored.  And she became more unbalanced and had difficulty walking a right.  She has a history of spina bifida lower back pain requiring chiropractic manipulation.  She says she is not interested in back surgery.  However, she has not made significant gains in ability to ambulate since I last saw her and she has had to use the cane more  She has slight right sided gluteal pain radiating in the posterior lateral aspect of the right thigh  She has pain in the right knee in the anterior and lateral portion near the patellar tendon.  She denies any fever chills or malaise.  She has had to decrease her insulin because of improved glucose numbers.  She also complains of lower back pain     Review of Systems  Musculoskeletal: Positive for back pain and joint pain.  Neurological: Negative for tingling and sensory change.       Poor balance   Current Meds  Medication Sig  . Alpha-Lipoic Acid 300 MG CAPS Take 600 mg by mouth daily.  Marland Kitchen aspirin EC 325 MG EC tablet Take 1 tablet (325 mg total) by mouth daily with breakfast.  . Homeopathic Products (ARNICA [place]TA[place] PO) Take 1 tablet by mouth 2 (two) times daily as needed (pain).  . Homeopathic Products (TRAUMEEL EX) Apply 1 application topically 2 (two) times daily as needed (pain).  Marland Kitchen losartan (COZAAR) 100 MG tablet Take 1 tablet (100 mg total) by mouth daily.  Marland Kitchen MILK THISTLE SEED CR PO Take 1.33 mLs by mouth  daily.  . Omega 3 1000 MG CAPS Take 1,000 mg by mouth daily.   Marland Kitchen OVER THE COUNTER MEDICATION Take 1 tablet by mouth 2 (two) times daily. Terry Naturally supplement:  Turmeric Boswellia Curcumin  . torsemide (DEMADEX) 20 MG tablet Take 2 tablets (40 mg total) by mouth daily. (Patient taking differently: Take 40 mg by mouth daily as needed (edema). )  . TRESIBA FLEXTOUCH 200 UNIT/ML SOPN    Current Facility-Administered Medications for the 03/09/17 encounter (Office Visit) with Carole Civil, MD  Medication  . doxycycline (VIBRA-TABS) tablet 100 mg    Past Medical History:  Diagnosis Date  . Anxiety   . Arthritis   . CHF (congestive heart failure) ([place])   . Chronic kidney disease   . Depression   . Diabetes mellitus without complication ([place])   . Hypertension   . Neuropathy   . Occult spina bifida      Allergies  Allergen Reactions  . Corticosteroids Other (See Comments)    Hallucinations/swelling in legs & feet/passed out (ORAL ROUTE ONLY PER PATIENT)   . Gluten Meal Other (See Comments)    Over time increases inflammation in body  . Oxycodone Other (See Comments)    TOO STRONG, RESPIRATORY DEPRESSION    BP (!) 214/102   Pulse 86   Ht 5\' 7"  (1.702 m)  Wt 295 lb (133.8 kg)   BMI 46.20 kg/m    Physical Exam  Constitutional:  Well-developed female mildly obese to moderately obese.  Oriented x3 mood Pleasant affect normal ambulatory with a cane with a flexion posture of the lumbosacral junction  Musculoskeletal:  The knee incision has healed the area where the breakdown occurred is healed with some involution.  She has distal peripheral edema with erythema of the lower leg.  Knee joint no effusion.  Knee stable strength normal range of motion 110 degrees.  No instability is noted in the knee joint she is tender around the patellofemoral region skin as described peripheral edema as described sensation normal.  When she stands she has altered posture with a flexed  posture at the lumbosacral junction she has tenderness in the lower back    Ortho Exam   Medical decision-making  Imaging: X-rays of the knee joint show stable prosthesis no loosening stem in the tibia appears to abut up against the tibial shaft but there is no cortical breakthrough.  There are no signs of infection  Lumbar spine exam shows degenerative scoliosis and spondylolisthesis of L3 on L4 with osteopenia    Encounter Diagnoses  Name Primary?  . 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 Acute bilateral low back pain with sciatica, sciatica laterality unspecified   . Spondylolisthesis at L3-L4 level Yes      No orders of the defined types were placed in this encounter.  I advised that she get an MRI of the lumbar spine but she does not want to do that because she is not interested in surgery.  She prefers to try the chiropractor which is okay as long as she gets better within the next 3 months.  I told her if she does not then I want to get an MRI to image for neural elements including the spine to look for spinal stenosis and perhaps advancement of her disease process  Knee replacement stable Back pain and spondylolisthesis worsening    Arther Abbott, MD 03/09/2017 10:41 AM

## 2017-04-13 DIAGNOSIS — I10 Essential (primary) hypertension: Secondary | ICD-10-CM | POA: Diagnosis not present

## 2017-04-19 DIAGNOSIS — E1129 Type 2 diabetes mellitus with other diabetic kidney complication: Secondary | ICD-10-CM | POA: Diagnosis not present

## 2017-04-19 DIAGNOSIS — I10 Essential (primary) hypertension: Secondary | ICD-10-CM | POA: Diagnosis not present

## 2017-04-19 DIAGNOSIS — E782 Mixed hyperlipidemia: Secondary | ICD-10-CM | POA: Diagnosis not present

## 2017-04-19 DIAGNOSIS — N183 Chronic kidney disease, stage 3 (moderate): Secondary | ICD-10-CM | POA: Diagnosis not present

## 2017-04-23 DIAGNOSIS — E119 Type 2 diabetes mellitus without complications: Secondary | ICD-10-CM | POA: Diagnosis not present

## 2017-04-23 DIAGNOSIS — R600 Localized edema: Secondary | ICD-10-CM | POA: Diagnosis not present

## 2017-04-25 DIAGNOSIS — R809 Proteinuria, unspecified: Secondary | ICD-10-CM | POA: Diagnosis not present

## 2017-04-25 DIAGNOSIS — E870 Hyperosmolality and hypernatremia: Secondary | ICD-10-CM | POA: Diagnosis not present

## 2017-04-25 DIAGNOSIS — N184 Chronic kidney disease, stage 4 (severe): Secondary | ICD-10-CM | POA: Diagnosis not present

## 2017-04-25 DIAGNOSIS — Z79899 Other long term (current) drug therapy: Secondary | ICD-10-CM | POA: Diagnosis not present

## 2017-04-25 DIAGNOSIS — N179 Acute kidney failure, unspecified: Secondary | ICD-10-CM | POA: Diagnosis not present

## 2017-06-04 DIAGNOSIS — D649 Anemia, unspecified: Secondary | ICD-10-CM | POA: Diagnosis not present

## 2017-06-04 DIAGNOSIS — I10 Essential (primary) hypertension: Secondary | ICD-10-CM | POA: Diagnosis not present

## 2017-06-04 DIAGNOSIS — N184 Chronic kidney disease, stage 4 (severe): Secondary | ICD-10-CM | POA: Diagnosis not present

## 2017-06-04 DIAGNOSIS — E559 Vitamin D deficiency, unspecified: Secondary | ICD-10-CM | POA: Diagnosis not present

## 2017-06-04 DIAGNOSIS — E1129 Type 2 diabetes mellitus with other diabetic kidney complication: Secondary | ICD-10-CM | POA: Diagnosis not present

## 2017-06-06 ENCOUNTER — Ambulatory Visit (INDEPENDENT_AMBULATORY_CARE_PROVIDER_SITE_OTHER): Payer: Medicare Other | Admitting: Orthopedic Surgery

## 2017-06-06 ENCOUNTER — Encounter: Payer: Self-pay | Admitting: Orthopedic Surgery

## 2017-06-06 VITALS — BP 172/109 | HR 77 | Ht 67.0 in | Wt 275.0 lb

## 2017-06-06 DIAGNOSIS — M4316 Spondylolisthesis, lumbar region: Secondary | ICD-10-CM

## 2017-06-06 DIAGNOSIS — Z96651 Presence of right artificial knee joint: Secondary | ICD-10-CM

## 2017-06-06 DIAGNOSIS — E559 Vitamin D deficiency, unspecified: Secondary | ICD-10-CM | POA: Diagnosis not present

## 2017-06-06 DIAGNOSIS — N2581 Secondary hyperparathyroidism of renal origin: Secondary | ICD-10-CM | POA: Diagnosis not present

## 2017-06-06 DIAGNOSIS — R809 Proteinuria, unspecified: Secondary | ICD-10-CM | POA: Diagnosis not present

## 2017-06-06 DIAGNOSIS — N184 Chronic kidney disease, stage 4 (severe): Secondary | ICD-10-CM | POA: Diagnosis not present

## 2017-06-06 NOTE — Progress Notes (Signed)
Progress Note   Patient ID: Natalie Friedman, female   DOB: 1951/03/25, 67 y.o.   MRN: [id number]  Chief Complaint  Patient presents with  . Back Pain    HPI   Review of Systems  Genitourinary:       Fluid retention   Skin:       Erythema right leg    Current Meds  Medication Sig  . Alpha-Lipoic Acid 300 MG CAPS Take 600 mg by mouth daily.  Marland Kitchen doxycycline (VIBRAMYCIN) 100 MG capsule Take 1 capsule (100 mg total) by mouth 2 (two) times daily.  . Homeopathic Products (ARNICA MONTANA PO) Take 1 tablet by mouth 2 (two) times daily as needed (pain).  . Homeopathic Products (TRAUMEEL EX) Apply 1 application topically 2 (two) times daily as needed (pain).  Marland Kitchen losartan (COZAAR) 100 MG tablet Take 1 tablet (100 mg total) by mouth daily.  Marland Kitchen MILK THISTLE SEED CR PO Take 1.33 mLs by mouth daily.  Marland Kitchen olmesartan (BENICAR) 40 MG tablet Take 40 mg by mouth daily.  . Omega 3 1000 MG CAPS Take 1,000 mg by mouth daily.   Marland Kitchen OVER THE COUNTER MEDICATION Take 1 tablet by mouth 2 (two) times daily. Terry Naturally supplement:  Turmeric Boswellia Curcumin  . torsemide (DEMADEX) 20 MG tablet Take 2 tablets (40 mg total) by mouth daily. (Patient taking differently: Take 40 mg by mouth daily as needed (edema). )  . TRESIBA FLEXTOUCH 200 UNIT/ML SOPN   . TURMERIC PO Take by mouth.   Current Facility-Administered Medications for the 06/06/17 encounter (Office Visit) with Carole Civil, MD  Medication  . doxycycline (VIBRA-TABS) tablet 100 mg    Allergies  Allergen Reactions  . Corticosteroids Other (See Comments)    Hallucinations/swelling in legs & feet/passed out (ORAL ROUTE ONLY PER PATIENT)   . Gluten Meal Other (See Comments)    Over time increases inflammation in body  . Oxycodone Other (See Comments)    TOO STRONG, RESPIRATORY DEPRESSION     BP (!) 172/109   Pulse 77   Ht 5\' 7"  (1.702 m)   Wt 275 lb (124.7 kg)   BMI 43.07 kg/m   Physical Exam  Constitutional: She is oriented to  person, place, and time. She appears well-developed and well-nourished.  Musculoskeletal:  Gait is supported by a cane   Right lower leg erythema as well as left foot bilateral peripheral edema.  Normal  Gross stability of the knee is normal  Neurological: She is alert and oriented to person, place, and time.  Psychiatric: She has a normal mood and affect. Judgment normal.  Vitals reviewed.    Medical decision-making Encounter Diagnoses  Name Primary?  . Spondylolisthesis at L3-L4 level Yes  . S/P total knee arthroplasty, right    To see her in June after the second surgery anniversary and start doing her films that day  Arther Abbott, MD 06/06/2017 10:19 AM

## 2017-07-25 DIAGNOSIS — N184 Chronic kidney disease, stage 4 (severe): Secondary | ICD-10-CM | POA: Diagnosis not present

## 2017-07-25 DIAGNOSIS — R944 Abnormal results of kidney function studies: Secondary | ICD-10-CM | POA: Diagnosis not present

## 2017-07-25 DIAGNOSIS — E1129 Type 2 diabetes mellitus with other diabetic kidney complication: Secondary | ICD-10-CM | POA: Diagnosis not present

## 2017-07-25 DIAGNOSIS — E875 Hyperkalemia: Secondary | ICD-10-CM | POA: Diagnosis not present

## 2017-07-25 DIAGNOSIS — D696 Thrombocytopenia, unspecified: Secondary | ICD-10-CM | POA: Diagnosis not present

## 2017-07-25 DIAGNOSIS — R600 Localized edema: Secondary | ICD-10-CM | POA: Diagnosis not present

## 2017-07-27 DIAGNOSIS — Z79899 Other long term (current) drug therapy: Secondary | ICD-10-CM | POA: Diagnosis not present

## 2017-07-27 DIAGNOSIS — N184 Chronic kidney disease, stage 4 (severe): Secondary | ICD-10-CM | POA: Diagnosis not present

## 2017-07-27 DIAGNOSIS — I10 Essential (primary) hypertension: Secondary | ICD-10-CM | POA: Diagnosis not present

## 2017-07-27 DIAGNOSIS — D509 Iron deficiency anemia, unspecified: Secondary | ICD-10-CM | POA: Diagnosis not present

## 2017-07-27 DIAGNOSIS — E559 Vitamin D deficiency, unspecified: Secondary | ICD-10-CM | POA: Diagnosis not present

## 2017-07-27 DIAGNOSIS — E1122 Type 2 diabetes mellitus with diabetic chronic kidney disease: Secondary | ICD-10-CM | POA: Diagnosis not present

## 2017-07-27 DIAGNOSIS — E782 Mixed hyperlipidemia: Secondary | ICD-10-CM | POA: Diagnosis not present

## 2017-07-27 DIAGNOSIS — R600 Localized edema: Secondary | ICD-10-CM | POA: Diagnosis not present

## 2017-07-27 DIAGNOSIS — N183 Chronic kidney disease, stage 3 (moderate): Secondary | ICD-10-CM | POA: Diagnosis not present

## 2017-08-01 DIAGNOSIS — R809 Proteinuria, unspecified: Secondary | ICD-10-CM | POA: Diagnosis not present

## 2017-08-01 DIAGNOSIS — N2581 Secondary hyperparathyroidism of renal origin: Secondary | ICD-10-CM | POA: Diagnosis not present

## 2017-08-01 DIAGNOSIS — E559 Vitamin D deficiency, unspecified: Secondary | ICD-10-CM | POA: Diagnosis not present

## 2017-08-01 DIAGNOSIS — N184 Chronic kidney disease, stage 4 (severe): Secondary | ICD-10-CM | POA: Diagnosis not present

## 2017-10-03 ENCOUNTER — Ambulatory Visit (INDEPENDENT_AMBULATORY_CARE_PROVIDER_SITE_OTHER): Payer: Medicare Other | Admitting: Orthopedic Surgery

## 2017-10-03 ENCOUNTER — Encounter: Payer: Self-pay | Admitting: Orthopedic Surgery

## 2017-10-03 ENCOUNTER — Ambulatory Visit (INDEPENDENT_AMBULATORY_CARE_PROVIDER_SITE_OTHER): Payer: Medicare Other

## 2017-10-03 VITALS — BP 208/95 | HR 68 | Ht 67.0 in | Wt 257.0 lb

## 2017-10-03 DIAGNOSIS — Z96651 Presence of right artificial knee joint: Secondary | ICD-10-CM

## 2017-10-03 NOTE — Progress Notes (Signed)
ANNUAL FOLLOW UP FOR  Right  TKA   Chief Complaint  Patient presents with  . Routine Post Op    knee replacement 07/20/16     HPI: The patient is here for the annual  follow-up x-ray for knee replacement. The patient is not complaining of pain weakness instability or stiffness in the repaired knee.   Review of Systems  Musculoskeletal: Positive for back pain.    Past Medical History:  Diagnosis Date  . Anxiety   . Arthritis   . CHF (congestive heart failure) ([place])   . Chronic kidney disease   . Depression   . Diabetes mellitus without complication ([place])   . Hypertension   . Neuropathy   . Occult spina bifida      Examination of the right  KNEE  BP (!) 208/95   Pulse 68   Ht 5\' 7"  (1.702 m)   Wt 257 lb (116.6 kg)   BMI 40.25 kg/m   General the patient is normally groomed in no distress  Mood normal Affect pleasant   The patient is Awake and alert ; oriented normal   Inspection shows : incision healed nicely without erythema, no tenderness no swelling  Range of motion total range of motion is 3-110  Stability the knee is stable anterior to posterior as well as medial to lateral  Strength quadriceps strength is normal  Skin no erythema around the skin incision  Cardiovascular mild chronic EDEMA   Neuro: normal sensation in the operative leg   Gait: normal expected gait with occasional  cane    Medical decision-making section  X-rays ordered with the following personal interpretation  Normal alignment without loosening   Diagnosis  Encounter Diagnosis  Name Primary?  . 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

## 2017-11-12 DIAGNOSIS — Z79899 Other long term (current) drug therapy: Secondary | ICD-10-CM | POA: Diagnosis not present

## 2017-11-12 DIAGNOSIS — I10 Essential (primary) hypertension: Secondary | ICD-10-CM | POA: Diagnosis not present

## 2017-11-12 DIAGNOSIS — N183 Chronic kidney disease, stage 3 (moderate): Secondary | ICD-10-CM | POA: Diagnosis not present

## 2017-11-12 DIAGNOSIS — D509 Iron deficiency anemia, unspecified: Secondary | ICD-10-CM | POA: Diagnosis not present

## 2017-11-12 DIAGNOSIS — E559 Vitamin D deficiency, unspecified: Secondary | ICD-10-CM | POA: Diagnosis not present

## 2017-11-12 DIAGNOSIS — R809 Proteinuria, unspecified: Secondary | ICD-10-CM | POA: Diagnosis not present

## 2017-11-19 DIAGNOSIS — E559 Vitamin D deficiency, unspecified: Secondary | ICD-10-CM | POA: Diagnosis not present

## 2017-11-19 DIAGNOSIS — N2581 Secondary hyperparathyroidism of renal origin: Secondary | ICD-10-CM | POA: Diagnosis not present

## 2017-11-19 DIAGNOSIS — N184 Chronic kidney disease, stage 4 (severe): Secondary | ICD-10-CM | POA: Diagnosis not present

## 2017-11-19 DIAGNOSIS — R809 Proteinuria, unspecified: Secondary | ICD-10-CM | POA: Diagnosis not present

## 2017-11-26 DIAGNOSIS — E1122 Type 2 diabetes mellitus with diabetic chronic kidney disease: Secondary | ICD-10-CM | POA: Diagnosis not present

## 2017-11-26 DIAGNOSIS — E875 Hyperkalemia: Secondary | ICD-10-CM | POA: Diagnosis not present

## 2017-11-26 DIAGNOSIS — E782 Mixed hyperlipidemia: Secondary | ICD-10-CM | POA: Diagnosis not present

## 2017-11-26 DIAGNOSIS — I10 Essential (primary) hypertension: Secondary | ICD-10-CM | POA: Diagnosis not present

## 2017-11-28 DIAGNOSIS — N184 Chronic kidney disease, stage 4 (severe): Secondary | ICD-10-CM | POA: Diagnosis not present

## 2017-11-28 DIAGNOSIS — E782 Mixed hyperlipidemia: Secondary | ICD-10-CM | POA: Diagnosis not present

## 2017-11-28 DIAGNOSIS — I10 Essential (primary) hypertension: Secondary | ICD-10-CM | POA: Diagnosis not present

## 2017-11-28 DIAGNOSIS — R600 Localized edema: Secondary | ICD-10-CM | POA: Diagnosis not present

## 2017-11-28 DIAGNOSIS — E1169 Type 2 diabetes mellitus with other specified complication: Secondary | ICD-10-CM | POA: Diagnosis not present

## 2018-01-17 DIAGNOSIS — N184 Chronic kidney disease, stage 4 (severe): Secondary | ICD-10-CM | POA: Diagnosis not present

## 2018-01-17 DIAGNOSIS — Z79899 Other long term (current) drug therapy: Secondary | ICD-10-CM | POA: Diagnosis not present

## 2018-01-17 DIAGNOSIS — E1129 Type 2 diabetes mellitus with other diabetic kidney complication: Secondary | ICD-10-CM | POA: Diagnosis not present

## 2018-01-17 DIAGNOSIS — R809 Proteinuria, unspecified: Secondary | ICD-10-CM | POA: Diagnosis not present

## 2018-01-17 DIAGNOSIS — D649 Anemia, unspecified: Secondary | ICD-10-CM | POA: Diagnosis not present

## 2018-01-30 DIAGNOSIS — R809 Proteinuria, unspecified: Secondary | ICD-10-CM | POA: Diagnosis not present

## 2018-01-30 DIAGNOSIS — I10 Essential (primary) hypertension: Secondary | ICD-10-CM | POA: Diagnosis not present

## 2018-01-30 DIAGNOSIS — E1129 Type 2 diabetes mellitus with other diabetic kidney complication: Secondary | ICD-10-CM | POA: Diagnosis not present

## 2018-01-30 DIAGNOSIS — N184 Chronic kidney disease, stage 4 (severe): Secondary | ICD-10-CM | POA: Diagnosis not present

## 2018-03-06 DIAGNOSIS — E875 Hyperkalemia: Secondary | ICD-10-CM | POA: Diagnosis not present

## 2018-03-06 DIAGNOSIS — N184 Chronic kidney disease, stage 4 (severe): Secondary | ICD-10-CM | POA: Diagnosis not present

## 2018-03-06 DIAGNOSIS — E1129 Type 2 diabetes mellitus with other diabetic kidney complication: Secondary | ICD-10-CM | POA: Diagnosis not present

## 2018-03-06 DIAGNOSIS — E782 Mixed hyperlipidemia: Secondary | ICD-10-CM | POA: Diagnosis not present

## 2018-03-06 DIAGNOSIS — I10 Essential (primary) hypertension: Secondary | ICD-10-CM | POA: Diagnosis not present

## 2018-03-13 ENCOUNTER — Other Ambulatory Visit (HOSPITAL_COMMUNITY): Payer: Self-pay | Admitting: Adult Health Nurse Practitioner

## 2018-03-13 DIAGNOSIS — E1129 Type 2 diabetes mellitus with other diabetic kidney complication: Secondary | ICD-10-CM | POA: Diagnosis not present

## 2018-03-13 DIAGNOSIS — N184 Chronic kidney disease, stage 4 (severe): Secondary | ICD-10-CM | POA: Diagnosis not present

## 2018-03-13 DIAGNOSIS — R42 Dizziness and giddiness: Secondary | ICD-10-CM

## 2018-03-13 DIAGNOSIS — E782 Mixed hyperlipidemia: Secondary | ICD-10-CM | POA: Diagnosis not present

## 2018-03-13 DIAGNOSIS — Z0000 Encounter for general adult medical examination without abnormal findings: Secondary | ICD-10-CM | POA: Diagnosis not present

## 2018-03-13 DIAGNOSIS — I10 Essential (primary) hypertension: Secondary | ICD-10-CM | POA: Diagnosis not present

## 2018-03-22 ENCOUNTER — Ambulatory Visit (HOSPITAL_COMMUNITY)
Admission: RE | Admit: 2018-03-22 | Discharge: 2018-03-22 | Disposition: A | Discharge status: Home / Self Care | Payer: Medicare Other | Source: Ambulatory Visit | Attending: Adult Health Nurse Practitioner | Admitting: Adult Health Nurse Practitioner

## 2018-03-22 DIAGNOSIS — G9389 Other specified disorders of brain: Secondary | ICD-10-CM | POA: Diagnosis not present

## 2018-03-22 DIAGNOSIS — R42 Dizziness and giddiness: Secondary | ICD-10-CM | POA: Diagnosis not present

## 2018-03-22 DIAGNOSIS — N289 Disorder of kidney and ureter, unspecified: Secondary | ICD-10-CM | POA: Insufficient documentation

## 2018-03-22 DIAGNOSIS — I6782 Cerebral ischemia: Secondary | ICD-10-CM | POA: Insufficient documentation

## 2018-03-22 DIAGNOSIS — J341 Cyst and mucocele of nose and nasal sinus: Secondary | ICD-10-CM | POA: Diagnosis not present

## 2018-03-22 DIAGNOSIS — I10 Essential (primary) hypertension: Secondary | ICD-10-CM | POA: Diagnosis not present

## 2018-03-26 DIAGNOSIS — D333 Benign neoplasm of cranial nerves: Secondary | ICD-10-CM | POA: Diagnosis not present

## 2018-03-28 ENCOUNTER — Other Ambulatory Visit: Payer: Self-pay | Admitting: Neurological Surgery

## 2018-03-28 DIAGNOSIS — D333 Benign neoplasm of cranial nerves: Secondary | ICD-10-CM | POA: Diagnosis not present

## 2018-04-11 NOTE — Pre-Procedure Instructions (Signed)
Natalie Friedman  04/11/2018      [facility] [phone] - [place], [place] - [address] AT [place] [address] [place] [address] Phone: [phone] Fax: [place], [place] [address] [place] [address] Phone: [phone] Fax: [phone]    Your procedure is scheduled on January 7th.  Report to [facility] Admitting at Section.M.  Call this number if you have problems the morning of surgery:  [phone]   Remember:  Do not eat or drink after midnight.    Take these medicines the morning of surgery with A SIP OF WATER  amLODipine (NORVASC)   7 days prior to surgery STOP taking any Aspirin (unless otherwise instructed by your surgeon), Aleve, Naproxen, Ibuprofen, Motrin, Advil, Goody's, BC's, all herbal medications, fish oil, and all vitamins.   WHAT DO I DO ABOUT MY DIABETES MEDICATION?   Marland Kitchen Do not take oral diabetes medicines (pills) the morning of surgery.  . The day of surgery, do not take other diabetes injectables, including Byetta (exenatide), Bydureon (exenatide ER), Victoza (liraglutide), or Trulicity (dulaglutide).  . If your CBG is greater than 220 mg/dL, you may take  of your sliding scale (correction) dose of insulin.   How to Manage Your Diabetes Before and After Surgery  Why is it important to control my blood sugar before and after surgery? . Improving blood sugar levels before and after surgery helps healing and can limit problems. . A way of improving blood sugar control is eating a healthy diet by: o  Eating less sugar and carbohydrates o  Increasing activity/exercise o  Talking with your doctor about reaching your blood sugar goals . High blood sugars (greater than 180 mg/dL) can raise your risk of infections and slow your recovery, so you will need to focus on controlling your diabetes during the weeks before  surgery. . Make sure that the doctor who takes care of your diabetes knows about your planned surgery including the date and location.  How do I manage my blood sugar before surgery? . Check your blood sugar at least 4 times a day, starting 2 days before surgery, to make sure that the level is not too high or low. o Check your blood sugar the morning of your surgery when you wake up and every 2 hours until you get to the Short Stay unit. . If your blood sugar is less than 70 mg/dL, you will need to treat for low blood sugar: o Do not take insulin. o Treat a low blood sugar (less than 70 mg/dL) with  cup of clear juice (cranberry or apple), 4 glucose tablets, OR glucose gel. o Recheck blood sugar in 15 minutes after treatment (to make sure it is greater than 70 mg/dL). If your blood sugar is not greater than 70 mg/dL on recheck, call [phone] for further instructions. . Report your blood sugar to the short stay nurse when you get to Short Stay.  . If you are admitted to the hospital after surgery: o Your blood sugar will be checked by the staff and you will probably be given insulin after surgery (instead of oral diabetes medicines) to make sure you have good blood sugar levels. o The goal for blood sugar control after surgery is 80-180 mg/dL.   Do not wear jewelry, make-up or nail polish.  Do not wear lotions, powders, or perfumes, or deodorant.  Do  not shave 48 hours prior to surgery.  Men may shave face and neck.  Do not bring valuables to the hospital.  [facility] is not responsible for any belongings or valuables.  Contacts, dentures or bridgework may not be worn into surgery.  Leave your suitcase in the car.  After surgery it may be brought to your room.  For patients admitted to the hospital, discharge time will be determined by your treatment team.  Patients discharged the day of surgery will not be allowed to drive home.    [place]- Preparing For Surgery  Before  surgery, you can play an important role. Because skin is not sterile, your skin needs to be as free of germs as possible. You can reduce the number of germs on your skin by washing with CHG (chlorahexidine gluconate) Soap before surgery.  CHG is an antiseptic cleaner which kills germs and bonds with the skin to continue killing germs even after washing.    Oral Hygiene is also important to reduce your risk of infection.  Remember - BRUSH YOUR TEETH THE MORNING OF SURGERY WITH YOUR REGULAR TOOTHPASTE  Please do not use if you have an allergy to CHG or antibacterial soaps. If your skin becomes reddened/irritated stop using the CHG.  Do not shave (including legs and underarms) for at least 48 hours prior to first CHG shower. It is OK to shave your face.  Please follow these instructions carefully.   1. Shower the NIGHT BEFORE SURGERY and the MORNING OF SURGERY with CHG.   2. If you chose to wash your hair, wash your hair first as usual with your normal shampoo.  3. After you shampoo, rinse your hair and body thoroughly to remove the shampoo.  4. Use CHG as you would any other liquid soap. You can apply CHG directly to the skin and wash gently with a scrungie or a clean washcloth.   5. Apply the CHG Soap to your body ONLY FROM THE NECK DOWN.  Do not use on open wounds or open sores. Avoid contact with your eyes, ears, mouth and genitals (private parts). Wash Face and genitals (private parts)  with your normal soap.  6. Wash thoroughly, paying special attention to the area where your surgery will be performed.  7. Thoroughly rinse your body with warm water from the neck down.  8. DO NOT shower/wash with your normal soap after using and rinsing off the CHG Soap.  9. Pat yourself dry with a CLEAN TOWEL.  10. Wear CLEAN PAJAMAS to bed the night before surgery, wear comfortable clothes the morning of surgery  11. Place CLEAN SHEETS on your bed the night of your first shower and DO NOT SLEEP WITH  PETS.    Day of Surgery:  Do not apply any deodorants/lotions.  Please wear clean clothes to the hospital/surgery center.   Remember to brush your teeth WITH YOUR REGULAR TOOTHPASTE.    Please read over the following fact sheets that you were given.

## 2018-04-12 ENCOUNTER — Other Ambulatory Visit: Payer: Self-pay

## 2018-04-12 ENCOUNTER — Encounter (HOSPITAL_COMMUNITY)
Admission: RE | Admit: 2018-04-12 | Discharge: 2018-04-12 | Disposition: A | Discharge status: Home / Self Care | Payer: Medicare Other | Source: Ambulatory Visit | Attending: Neurological Surgery | Admitting: Neurological Surgery

## 2018-04-12 ENCOUNTER — Encounter (HOSPITAL_COMMUNITY): Payer: Self-pay

## 2018-04-12 DIAGNOSIS — Z96651 Presence of right artificial knee joint: Secondary | ICD-10-CM | POA: Diagnosis not present

## 2018-04-12 DIAGNOSIS — R296 Repeated falls: Secondary | ICD-10-CM | POA: Diagnosis not present

## 2018-04-12 DIAGNOSIS — I5032 Chronic diastolic (congestive) heart failure: Secondary | ICD-10-CM | POA: Diagnosis not present

## 2018-04-12 DIAGNOSIS — G9389 Other specified disorders of brain: Secondary | ICD-10-CM | POA: Diagnosis not present

## 2018-04-12 DIAGNOSIS — R5381 Other malaise: Secondary | ICD-10-CM | POA: Diagnosis not present

## 2018-04-12 DIAGNOSIS — H9191 Unspecified hearing loss, right ear: Secondary | ICD-10-CM | POA: Diagnosis not present

## 2018-04-12 DIAGNOSIS — N184 Chronic kidney disease, stage 4 (severe): Secondary | ICD-10-CM | POA: Diagnosis not present

## 2018-04-12 DIAGNOSIS — I130 Hypertensive heart and chronic kidney disease with heart failure and stage 1 through stage 4 chronic kidney disease, or unspecified chronic kidney disease: Secondary | ICD-10-CM | POA: Diagnosis not present

## 2018-04-12 DIAGNOSIS — Z01818 Encounter for other preprocedural examination: Secondary | ICD-10-CM

## 2018-04-12 DIAGNOSIS — E1122 Type 2 diabetes mellitus with diabetic chronic kidney disease: Secondary | ICD-10-CM | POA: Diagnosis not present

## 2018-04-12 DIAGNOSIS — E1140 Type 2 diabetes mellitus with diabetic neuropathy, unspecified: Secondary | ICD-10-CM | POA: Diagnosis not present

## 2018-04-12 DIAGNOSIS — R112 Nausea with vomiting, unspecified: Secondary | ICD-10-CM | POA: Diagnosis not present

## 2018-04-12 DIAGNOSIS — R270 Ataxia, unspecified: Secondary | ICD-10-CM | POA: Diagnosis not present

## 2018-04-12 DIAGNOSIS — G510 Bell's palsy: Secondary | ICD-10-CM | POA: Diagnosis not present

## 2018-04-12 DIAGNOSIS — Z800 Family history of malignant neoplasm of digestive organs: Secondary | ICD-10-CM | POA: Diagnosis not present

## 2018-04-12 DIAGNOSIS — N179 Acute kidney failure, unspecified: Secondary | ICD-10-CM | POA: Diagnosis not present

## 2018-04-12 DIAGNOSIS — R1311 Dysphagia, oral phase: Secondary | ICD-10-CM | POA: Diagnosis not present

## 2018-04-12 DIAGNOSIS — D333 Benign neoplasm of cranial nerves: Secondary | ICD-10-CM | POA: Diagnosis not present

## 2018-04-12 DIAGNOSIS — H532 Diplopia: Secondary | ICD-10-CM | POA: Diagnosis not present

## 2018-04-12 DIAGNOSIS — R471 Dysarthria and anarthria: Secondary | ICD-10-CM | POA: Diagnosis not present

## 2018-04-12 DIAGNOSIS — Q059 Spina bifida, unspecified: Secondary | ICD-10-CM | POA: Diagnosis not present

## 2018-04-12 DIAGNOSIS — M1990 Unspecified osteoarthritis, unspecified site: Secondary | ICD-10-CM | POA: Diagnosis not present

## 2018-04-12 DIAGNOSIS — D62 Acute posthemorrhagic anemia: Secondary | ICD-10-CM | POA: Diagnosis not present

## 2018-04-12 DIAGNOSIS — R0902 Hypoxemia: Secondary | ICD-10-CM | POA: Diagnosis not present

## 2018-04-12 LAB — CBC
HCT: 38.4 % (ref 36.0–46.0)
Hemoglobin: 12.4 g/dL (ref 12.0–15.0)
MCH: 30.2 pg (ref 26.0–34.0)
MCHC: 32.3 g/dL (ref 30.0–36.0)
MCV: 93.7 fL (ref 80.0–100.0)
Platelets: 200 10*3/uL (ref 150–400)
RBC: 4.1 MIL/uL (ref 3.87–5.11)
RDW: 11.2 % — ABNORMAL LOW (ref 11.5–15.5)
WBC: 7.4 10*3/uL (ref 4.0–10.5)
nRBC: 0 % (ref 0.0–0.2)

## 2018-04-12 LAB — BASIC METABOLIC PANEL
Anion gap: 10 (ref 5–15)
BUN: 32 mg/dL — ABNORMAL HIGH (ref 8–23)
CHLORIDE: 105 mmol/L (ref 98–111)
CO2: 24 mmol/L (ref 22–32)
Calcium: 9.5 mg/dL (ref 8.9–10.3)
Creatinine, Ser: 2.4 mg/dL — ABNORMAL HIGH (ref 0.44–1.00)
GFR calc Af Amer: 23 mL/min — ABNORMAL LOW (ref >60)
GFR calc non Af Amer: 20 mL/min — ABNORMAL LOW (ref >60)
GLUCOSE: 227 mg/dL — AB (ref 70–99)
Potassium: 4.2 mmol/L (ref 3.5–5.1)
Sodium: 139 mmol/L (ref 135–145)

## 2018-04-12 LAB — TYPE AND SCREEN
ABO/RH(D): A POS
Antibody Screen: NEGATIVE

## 2018-04-12 LAB — HEMOGLOBIN A1C
Hgb A1c MFr Bld: 6.5 % — ABNORMAL HIGH (ref 4.8–5.6)
Mean Plasma Glucose: 139.85 mg/dL

## 2018-04-12 LAB — ABO/RH: ABO/RH(D): A POS

## 2018-04-12 LAB — GLUCOSE, CAPILLARY: Glucose-Capillary: 213 mg/dL — ABNORMAL HIGH (ref 70–99)

## 2018-04-12 NOTE — Progress Notes (Addendum)
PCP -  Allyn Kenner, MD Cardiologist - pt denies  Chest x-ray - pt denies past year, no recent respiratory infections/complications EKG - 08/08/8333 in EPIC  Stress Test - pt denies ECHO - 05/2016 in EPIC  Cardiac Cath - pt denies  Sleep Study - pt denies CPAP - n/a  Fasting Blood Sugar -  120 Checks Blood Sugar 2  times a day  Blood Thinner Instructions: pt denies Aspirin Instructions: pt denies  Anesthesia review:   Patient denies shortness of breath, fever, cough and chest pain at PAT appointment  Patient verbalized understanding of instructions that were given to them at the PAT appointment. Patient was also instructed that they will need to review over the PAT instructions again at home before surgery.

## 2018-04-15 ENCOUNTER — Other Ambulatory Visit (HOSPITAL_COMMUNITY): Payer: Self-pay | Admitting: Neurological Surgery

## 2018-04-15 ENCOUNTER — Ambulatory Visit (HOSPITAL_COMMUNITY)
Admission: RE | Admit: 2018-04-15 | Discharge: 2018-04-15 | Disposition: A | Discharge status: Home / Self Care | Payer: Medicare Other | Source: Ambulatory Visit | Attending: Neurological Surgery | Admitting: Neurological Surgery

## 2018-04-15 ENCOUNTER — Other Ambulatory Visit: Payer: Self-pay | Admitting: Neurological Surgery

## 2018-04-15 DIAGNOSIS — G9389 Other specified disorders of brain: Secondary | ICD-10-CM | POA: Diagnosis not present

## 2018-04-15 DIAGNOSIS — D333 Benign neoplasm of cranial nerves: Secondary | ICD-10-CM | POA: Insufficient documentation

## 2018-04-15 NOTE — Progress Notes (Signed)
Anesthesia Chart Review:  Case:  [id number] Date/Time:  04/16/18 0715   Procedures:      Right craniotomy for tumor resection with brainlab (Right ) - Right craniotomy for tumor resection with brainlab     APPLICATION OF CRANIAL NAVIGATION (N/A )   Anesthesia type:  General   Pre-op diagnosis:  Acoustic Neuroma   Location:  [facility] OR ROOM 19 / [place] OR   Surgeon:  Judith Part, MD      DISCUSSION: 68 yo female never smoker. Pertinent hx includes DMII, HTN, CKD IV, Neuropathy.  Pt with stage 4 CKD. Review of nephrology notes in care everywhere shows baseline creatinine ~2.3. Creatinine on preop labs 2.40.  Pt had eval by Dr. Johnsie Cancel in 05/2016 while hospitalized for fluid overload. He believed it to be more related to nephrotic syndrome based on proteinuria and reassuring echo. Echo showed EF 50-55%, normal wall motion, grade 2 dd, normal valves. It does not appear that cardiology followup was recommended.   Anticipate she can proceed as planned barring acute status change.  VS: BP (!) 142/80 Comment: manually rechecked after PAT visit  Pulse 91   Temp 36.9 C   Ht 5\' 5"  (1.651 m)   Wt 105 kg   SpO2 99%   BMI 38.51 kg/m   PROVIDERS: Celene Squibb, MD is PCP  Ramiro Harvest, MD is Nephrologist, last seen 01/30/2018  LABS: Labs reviewed: Acceptable for surgery. Elevated creatinine c/w pt's CKD IV (all labs ordered are listed, but only abnormal results are displayed)  Labs Reviewed  GLUCOSE, CAPILLARY - Abnormal; Notable for the following components:      Result Value   Glucose-Capillary 213 (*)    All other components within normal limits  CBC - Abnormal; Notable for the following components:   RDW 11.2 (*)    All other components within normal limits  BASIC METABOLIC PANEL - Abnormal; Notable for the following components:   Glucose, Bld 227 (*)    BUN 32 (*)    Creatinine, Ser 2.40 (*)    GFR calc non Af Amer 20 (*)    GFR calc Af Amer 23 (*)    All other components within  normal limits  HEMOGLOBIN A1C - Abnormal; Notable for the following components:   Hgb A1c MFr Bld 6.5 (*)    All other components within normal limits  TYPE AND SCREEN  ABO/RH     IMAGES: MRI Brain 03/22/2018: IMPRESSION: Large mass lesion right cerebellar pontine angle cistern extending into the right internal auditory canal most compatible with vestibular schwannoma. Differential includes meningioma however the extension into the internal auditory canal is very supportive of vestibular schwannoma. There is significant mass-effect on the pons and brachium pontis. Intravenous contrast not given due to renal insufficiency.  Advanced chronic microvascular ischemic changes.  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   EKG: 04/12/2018: Sinus rhythm with marked sinus arrhythmia. Rate 86. LAD.Marland Kitchen Nonspecific ST and T wave abnormality.  CV: TTE 05/13/2016: Study Conclusions  - Left ventricle: The cavity size was normal. There was moderate   concentric hypertrophy. Systolic function was at the lower limits   of normal. The estimated ejection fraction was in the range of   50% to 55%. Wall motion was normal; there were no regional wall   motion abnormalities. Features are consistent with a pseudonormal  left ventricular filling pattern, with concomitant abnormal   relaxation and increased filling pressure (grade 2 diastolic   dysfunction). - Left atrium: The atrium was mildly dilated.   Past Medical History:  Diagnosis Date  . Anxiety   . Arthritis   . CHF (congestive heart failure) ([place])   . Chronic kidney disease   . Depression   . Diabetes mellitus without complication ([place])   . Hypertension   . Neuropathy   . Occult spina bifida     Past Surgical History:  Procedure Laterality Date  . ectopic    .  INCISION AND DRAINAGE Right 10/04/2016   Procedure: INCISION AND DRAINAGE AND ASPIRATION;  Surgeon: Carole Civil, MD;  Location: [facility]S;  Service: Orthopedics;  Laterality: Right;  right knee wound  . patella fx Right 07/2015  . TOTAL KNEE ARTHROPLASTY Right 07/20/2016   Procedure: TOTAL KNEE ARTHROPLASTY;  Surgeon: Carole Civil, MD;  Location: [facility];  Service: Orthopedics;  Laterality: Right;    MEDICATIONS: . Alpha-Lipoic Acid 200 MG TABS  . amLODipine (NORVASC) 5 MG tablet  . aspirin EC 325 MG EC tablet  . Cholecalciferol (VITAMIN D) 50 MCG (2000 UT) CAPS  . Coenzyme Q10 (COQ10) 150 MG CAPS  . doxycycline (VIBRAMYCIN) 100 MG capsule  . Glucosamine-Chondroit-Vit C-Mn (GLUCOSAMINE 1500 COMPLEX PO)  . Homeopathic Products (ARNICA [place] PO)  . Homeopathic Products (TRAUMEEL EX)  . insulin detemir (LEVEMIR) 100 UNIT/ML injection  . losartan (COZAAR) 100 MG tablet  . olmesartan (BENICAR) 40 MG tablet  . potassium chloride SA (K-DUR,KLOR-CON) 10 MEQ tablet  . torsemide (DEMADEX) 20 MG tablet  . vitamin B-12 (CYANOCOBALAMIN) 500 MCG tablet  . Zinc 30 MG TABS   . doxycycline (VIBRA-TABS) tablet 100 mg  Per [facility] documentation, pt no longer taking ASA 325, Doxycycline, Losartan, Insulin, or Demadex. Unclear why they are still listed.   Wynonia Musty [facility] Short Stay Center/Anesthesiology Phone [phone] 04/15/2018 9:05 AM

## 2018-04-15 NOTE — Anesthesia Preprocedure Evaluation (Addendum)
Anesthesia Evaluation  Patient identified by MRN, date of birth, ID band Patient awake    Reviewed: Allergy & Precautions, NPO status , Patient's Chart, lab work & pertinent test results  Airway Mallampati: II  TM Distance: >3 FB Neck ROM: Full    Dental  (+) Partial Upper   Pulmonary neg pulmonary ROS,    Pulmonary exam normal breath sounds clear to auscultation       Cardiovascular hypertension, Pt. on medications Normal cardiovascular exam Rhythm:Regular Rate:Normal  ECG: rate 86. Sinus rhythm with marked sinus arrhythmia Left axis deviation  ECHO: Left ventricle: The cavity size was normal. There was moderate concentric hypertrophy. Systolic function was at the lower limits of normal. The estimated ejection fraction was in the range of 50% to 55%. Wall motion was normal; there were no regional wall motion abnormalities. Features are consistent with a pseudonormal left ventricular filling pattern, with concomitant abnormal relaxation and increased filling pressure (grade 2 diastolic dysfunction). Left atrium: The atrium was mildly dilated.   Neuro/Psych PSYCHIATRIC DISORDERS Anxiety Depression MR Large mass lesion right cerebellar pontine angle cistern extending into the right internal auditory canal most compatible with vestibular schwannoma. Differential includes meningioma however the extension into the internal auditory canal is very supportive of vestibular schwannoma. There is significant mass-effect on the pons and brachium pontis. Intravenous contrast not given due to renal insufficiency.    GI/Hepatic negative GI ROS, Neg liver ROS,   Endo/Other  diabetes, Insulin Dependent  Renal/GU CRFRenal disease     Musculoskeletal Occult spina bifida   Abdominal (+) + obese,   Peds  Hematology negative hematology ROS (+)   Anesthesia Other Findings Acoustic Neuroma  Reproductive/Obstetrics                            Anesthesia Physical Anesthesia Plan  ASA: III  Anesthesia Plan: General   Post-op Pain Management:    Induction: Intravenous  PONV Risk Score and Plan: 3 and Ondansetron and Treatment may vary due to age or medical condition  Airway Management Planned: Oral ETT  Additional Equipment: Arterial line, CVP and Ultrasound Guidance Line Placement  Intra-op Plan:   Post-operative Plan: Extubation in OR  Informed Consent: I have reviewed the patients History and Physical, chart, labs and discussed the procedure including the risks, benefits and alternatives for the proposed anesthesia with the patient or authorized representative who has indicated his/her understanding and acceptance.   Dental advisory given  Plan Discussed with:   Anesthesia Plan Comments: (Reviewed PAT note by Karoline Caldwell, PA-C )      Anesthesia Quick Evaluation

## 2018-04-16 ENCOUNTER — Inpatient Hospital Stay (HOSPITAL_COMMUNITY): Payer: Medicare Other | Admitting: Physician Assistant

## 2018-04-16 ENCOUNTER — Other Ambulatory Visit: Payer: Self-pay

## 2018-04-16 ENCOUNTER — Inpatient Hospital Stay (HOSPITAL_COMMUNITY): Payer: Medicare Other | Admitting: Certified Registered Nurse Anesthetist

## 2018-04-16 ENCOUNTER — Inpatient Hospital Stay (HOSPITAL_COMMUNITY)
Admission: RE | Disposition: A | Discharge status: Rehabilitation Facility | Payer: Self-pay | Source: Home / Self Care | Attending: Neurological Surgery

## 2018-04-16 ENCOUNTER — Inpatient Hospital Stay (HOSPITAL_COMMUNITY): Payer: Medicare Other

## 2018-04-16 ENCOUNTER — Encounter (HOSPITAL_COMMUNITY): Payer: Self-pay | Admitting: Surgery

## 2018-04-16 ENCOUNTER — Inpatient Hospital Stay (HOSPITAL_COMMUNITY)
Admission: RE | Admit: 2018-04-16 | Discharge: 2018-04-19 | DRG: 026 | Disposition: A | Discharge status: Rehabilitation Facility | Payer: Medicare Other | Attending: Neurological Surgery | Admitting: Neurological Surgery

## 2018-04-16 DIAGNOSIS — I5032 Chronic diastolic (congestive) heart failure: Secondary | ICD-10-CM | POA: Diagnosis present

## 2018-04-16 DIAGNOSIS — E669 Obesity, unspecified: Secondary | ICD-10-CM | POA: Diagnosis not present

## 2018-04-16 DIAGNOSIS — M549 Dorsalgia, unspecified: Secondary | ICD-10-CM | POA: Diagnosis not present

## 2018-04-16 DIAGNOSIS — E1122 Type 2 diabetes mellitus with diabetic chronic kidney disease: Secondary | ICD-10-CM | POA: Diagnosis not present

## 2018-04-16 DIAGNOSIS — I509 Heart failure, unspecified: Secondary | ICD-10-CM | POA: Diagnosis not present

## 2018-04-16 DIAGNOSIS — Z96651 Presence of right artificial knee joint: Secondary | ICD-10-CM | POA: Diagnosis present

## 2018-04-16 DIAGNOSIS — R1311 Dysphagia, oral phase: Secondary | ICD-10-CM | POA: Diagnosis not present

## 2018-04-16 DIAGNOSIS — I130 Hypertensive heart and chronic kidney disease with heart failure and stage 1 through stage 4 chronic kidney disease, or unspecified chronic kidney disease: Secondary | ICD-10-CM | POA: Diagnosis not present

## 2018-04-16 DIAGNOSIS — E46 Unspecified protein-calorie malnutrition: Secondary | ICD-10-CM | POA: Diagnosis not present

## 2018-04-16 DIAGNOSIS — D3610 Benign neoplasm of peripheral nerves and autonomic nervous system, unspecified: Secondary | ICD-10-CM | POA: Diagnosis not present

## 2018-04-16 DIAGNOSIS — T148XXA Other injury of unspecified body region, initial encounter: Secondary | ICD-10-CM | POA: Diagnosis not present

## 2018-04-16 DIAGNOSIS — G9389 Other specified disorders of brain: Secondary | ICD-10-CM | POA: Diagnosis not present

## 2018-04-16 DIAGNOSIS — R339 Retention of urine, unspecified: Secondary | ICD-10-CM | POA: Diagnosis not present

## 2018-04-16 DIAGNOSIS — Z452 Encounter for adjustment and management of vascular access device: Secondary | ICD-10-CM | POA: Diagnosis not present

## 2018-04-16 DIAGNOSIS — F329 Major depressive disorder, single episode, unspecified: Secondary | ICD-10-CM | POA: Diagnosis present

## 2018-04-16 DIAGNOSIS — Z6837 Body mass index (BMI) 37.0-37.9, adult: Secondary | ICD-10-CM

## 2018-04-16 DIAGNOSIS — I169 Hypertensive crisis, unspecified: Secondary | ICD-10-CM | POA: Diagnosis not present

## 2018-04-16 DIAGNOSIS — H532 Diplopia: Secondary | ICD-10-CM | POA: Diagnosis not present

## 2018-04-16 DIAGNOSIS — R1312 Dysphagia, oropharyngeal phase: Secondary | ICD-10-CM | POA: Diagnosis not present

## 2018-04-16 DIAGNOSIS — R5381 Other malaise: Secondary | ICD-10-CM | POA: Diagnosis not present

## 2018-04-16 DIAGNOSIS — E1140 Type 2 diabetes mellitus with diabetic neuropathy, unspecified: Secondary | ICD-10-CM | POA: Diagnosis present

## 2018-04-16 DIAGNOSIS — E876 Hypokalemia: Secondary | ICD-10-CM | POA: Diagnosis not present

## 2018-04-16 DIAGNOSIS — Z483 Aftercare following surgery for neoplasm: Secondary | ICD-10-CM | POA: Diagnosis not present

## 2018-04-16 DIAGNOSIS — F419 Anxiety disorder, unspecified: Secondary | ICD-10-CM | POA: Diagnosis present

## 2018-04-16 DIAGNOSIS — R0989 Other specified symptoms and signs involving the circulatory and respiratory systems: Secondary | ICD-10-CM | POA: Diagnosis not present

## 2018-04-16 DIAGNOSIS — R8290 Unspecified abnormal findings in urine: Secondary | ICD-10-CM | POA: Diagnosis not present

## 2018-04-16 DIAGNOSIS — G8929 Other chronic pain: Secondary | ICD-10-CM | POA: Diagnosis present

## 2018-04-16 DIAGNOSIS — M1990 Unspecified osteoarthritis, unspecified site: Secondary | ICD-10-CM | POA: Diagnosis present

## 2018-04-16 DIAGNOSIS — X58XXXA Exposure to other specified factors, initial encounter: Secondary | ICD-10-CM | POA: Diagnosis present

## 2018-04-16 DIAGNOSIS — Z885 Allergy status to narcotic agent status: Secondary | ICD-10-CM

## 2018-04-16 DIAGNOSIS — R1310 Dysphagia, unspecified: Secondary | ICD-10-CM | POA: Diagnosis not present

## 2018-04-16 DIAGNOSIS — R471 Dysarthria and anarthria: Secondary | ICD-10-CM | POA: Diagnosis not present

## 2018-04-16 DIAGNOSIS — H5500 Unspecified nystagmus: Secondary | ICD-10-CM | POA: Diagnosis present

## 2018-04-16 DIAGNOSIS — Z794 Long term (current) use of insulin: Secondary | ICD-10-CM

## 2018-04-16 DIAGNOSIS — G629 Polyneuropathy, unspecified: Secondary | ICD-10-CM | POA: Diagnosis not present

## 2018-04-16 DIAGNOSIS — G519 Disorder of facial nerve, unspecified: Secondary | ICD-10-CM | POA: Diagnosis not present

## 2018-04-16 DIAGNOSIS — H9191 Unspecified hearing loss, right ear: Secondary | ICD-10-CM | POA: Diagnosis not present

## 2018-04-16 DIAGNOSIS — R0902 Hypoxemia: Secondary | ICD-10-CM | POA: Diagnosis not present

## 2018-04-16 DIAGNOSIS — D62 Acute posthemorrhagic anemia: Secondary | ICD-10-CM | POA: Diagnosis not present

## 2018-04-16 DIAGNOSIS — R270 Ataxia, unspecified: Secondary | ICD-10-CM | POA: Diagnosis not present

## 2018-04-16 DIAGNOSIS — Q059 Spina bifida, unspecified: Secondary | ICD-10-CM | POA: Diagnosis not present

## 2018-04-16 DIAGNOSIS — R7309 Other abnormal glucose: Secondary | ICD-10-CM | POA: Diagnosis not present

## 2018-04-16 DIAGNOSIS — N184 Chronic kidney disease, stage 4 (severe): Secondary | ICD-10-CM | POA: Diagnosis present

## 2018-04-16 DIAGNOSIS — Z9889 Other specified postprocedural states: Secondary | ICD-10-CM | POA: Diagnosis not present

## 2018-04-16 DIAGNOSIS — D333 Benign neoplasm of cranial nerves: Secondary | ICD-10-CM | POA: Diagnosis not present

## 2018-04-16 DIAGNOSIS — E441 Mild protein-calorie malnutrition: Secondary | ICD-10-CM | POA: Diagnosis not present

## 2018-04-16 DIAGNOSIS — D3613 Benign neoplasm of peripheral nerves and autonomic nervous system of lower limb, including hip: Secondary | ICD-10-CM | POA: Diagnosis present

## 2018-04-16 DIAGNOSIS — N179 Acute kidney failure, unspecified: Secondary | ICD-10-CM | POA: Diagnosis not present

## 2018-04-16 DIAGNOSIS — Z7982 Long term (current) use of aspirin: Secondary | ICD-10-CM

## 2018-04-16 DIAGNOSIS — B9620 Unspecified Escherichia coli [E. coli] as the cause of diseases classified elsewhere: Secondary | ICD-10-CM | POA: Diagnosis not present

## 2018-04-16 DIAGNOSIS — R112 Nausea with vomiting, unspecified: Secondary | ICD-10-CM | POA: Diagnosis not present

## 2018-04-16 DIAGNOSIS — D496 Neoplasm of unspecified behavior of brain: Secondary | ICD-10-CM | POA: Diagnosis present

## 2018-04-16 DIAGNOSIS — Z888 Allergy status to other drugs, medicaments and biological substances status: Secondary | ICD-10-CM

## 2018-04-16 DIAGNOSIS — D72829 Elevated white blood cell count, unspecified: Secondary | ICD-10-CM | POA: Diagnosis not present

## 2018-04-16 DIAGNOSIS — Z79899 Other long term (current) drug therapy: Secondary | ICD-10-CM

## 2018-04-16 DIAGNOSIS — Z91018 Allergy to other foods: Secondary | ICD-10-CM

## 2018-04-16 DIAGNOSIS — H832X1 Labyrinthine dysfunction, right ear: Secondary | ICD-10-CM | POA: Diagnosis not present

## 2018-04-16 DIAGNOSIS — G510 Bell's palsy: Secondary | ICD-10-CM | POA: Diagnosis not present

## 2018-04-16 DIAGNOSIS — E119 Type 2 diabetes mellitus without complications: Secondary | ICD-10-CM | POA: Diagnosis not present

## 2018-04-16 DIAGNOSIS — E875 Hyperkalemia: Secondary | ICD-10-CM | POA: Diagnosis not present

## 2018-04-16 DIAGNOSIS — N183 Chronic kidney disease, stage 3 (moderate): Secondary | ICD-10-CM | POA: Diagnosis not present

## 2018-04-16 DIAGNOSIS — E1169 Type 2 diabetes mellitus with other specified complication: Secondary | ICD-10-CM | POA: Diagnosis not present

## 2018-04-16 DIAGNOSIS — Z95828 Presence of other vascular implants and grafts: Secondary | ICD-10-CM

## 2018-04-16 DIAGNOSIS — Z9981 Dependence on supplemental oxygen: Secondary | ICD-10-CM | POA: Diagnosis not present

## 2018-04-16 DIAGNOSIS — R296 Repeated falls: Secondary | ICD-10-CM | POA: Diagnosis present

## 2018-04-16 DIAGNOSIS — I10 Essential (primary) hypertension: Secondary | ICD-10-CM | POA: Diagnosis not present

## 2018-04-16 DIAGNOSIS — Z800 Family history of malignant neoplasm of digestive organs: Secondary | ICD-10-CM

## 2018-04-16 DIAGNOSIS — N390 Urinary tract infection, site not specified: Secondary | ICD-10-CM | POA: Diagnosis not present

## 2018-04-16 DIAGNOSIS — I110 Hypertensive heart disease with heart failure: Secondary | ICD-10-CM | POA: Diagnosis not present

## 2018-04-16 HISTORY — PX: APPLICATION OF CRANIAL NAVIGATION: SHX6578

## 2018-04-16 HISTORY — PX: CRANIOTOMY: SHX93

## 2018-04-16 LAB — MRSA PCR SCREENING: MRSA by PCR: NEGATIVE

## 2018-04-16 LAB — CBC
HCT: 32.6 % — ABNORMAL LOW (ref 36.0–46.0)
Hemoglobin: 10.5 g/dL — ABNORMAL LOW (ref 12.0–15.0)
MCH: 29.7 pg (ref 26.0–34.0)
MCHC: 32.2 g/dL (ref 30.0–36.0)
MCV: 92.4 fL (ref 80.0–100.0)
Platelets: 160 10*3/uL (ref 150–400)
RBC: 3.53 MIL/uL — ABNORMAL LOW (ref 3.87–5.11)
RDW: 11.2 % — ABNORMAL LOW (ref 11.5–15.5)
WBC: 10.5 10*3/uL (ref 4.0–10.5)
nRBC: 0 % (ref 0.0–0.2)

## 2018-04-16 LAB — POCT I-STAT 7, (LYTES, BLD GAS, ICA,H+H)
Bicarbonate: 25.1 mmol/L (ref 20.0–28.0)
CALCIUM ION: 1.27 mmol/L (ref 1.15–1.40)
HEMATOCRIT: 29 % — AB (ref 36.0–46.0)
Hemoglobin: 9.9 g/dL — ABNORMAL LOW (ref 12.0–15.0)
O2 Saturation: 100 %
PH ART: 7.359 (ref 7.350–7.450)
Potassium: 4.4 mmol/L (ref 3.5–5.1)
Sodium: 138 mmol/L (ref 135–145)
TCO2: 26 mmol/L (ref 22–32)
pCO2 arterial: 44.6 mmHg (ref 32.0–48.0)
pO2, Arterial: 185 mmHg — ABNORMAL HIGH (ref 83.0–108.0)

## 2018-04-16 LAB — CREATININE, SERUM
Creatinine, Ser: 2.23 mg/dL — ABNORMAL HIGH (ref 0.44–1.00)
GFR calc Af Amer: 25 mL/min — ABNORMAL LOW (ref >60)
GFR calc non Af Amer: 22 mL/min — ABNORMAL LOW (ref >60)

## 2018-04-16 LAB — GLUCOSE, CAPILLARY
Glucose-Capillary: 192 mg/dL — ABNORMAL HIGH (ref 70–99)
Glucose-Capillary: 207 mg/dL — ABNORMAL HIGH (ref 70–99)

## 2018-04-16 SURGERY — CRANIOTOMY TUMOR EXCISION
Anesthesia: General | Site: Head | Laterality: Right

## 2018-04-16 MED ORDER — DOCUSATE SODIUM 100 MG PO CAPS
100.0000 mg | ORAL_CAPSULE | Freq: Two times a day (BID) | ORAL | Status: DC
  Filled 2018-04-16 (×4): qty 1

## 2018-04-16 MED ORDER — LIDOCAINE-EPINEPHRINE 1 %-1:100000 IJ SOLN
INTRAMUSCULAR | Status: AC
  Filled 2018-04-16: qty 1

## 2018-04-16 MED ORDER — SUGAMMADEX SODIUM 200 MG/2ML IV SOLN
INTRAVENOUS | Status: DC | PRN
  Administered 2018-04-16: 200 mg via INTRAVENOUS

## 2018-04-16 MED ORDER — ACETAMINOPHEN 650 MG RE SUPP: 650.0000 mg | RECTAL | Status: DC | PRN

## 2018-04-16 MED ORDER — SODIUM CHLORIDE 0.9 % IV SOLN
INTRAVENOUS | Status: AC
  Administered 2018-04-16: 18:00:00 via INTRAVENOUS

## 2018-04-16 MED ORDER — THROMBIN 5000 UNITS EX SOLR
OROMUCOSAL | Status: DC | PRN
  Administered 2018-04-16 (×2): 5 mL via TOPICAL

## 2018-04-16 MED ORDER — PROMETHAZINE HCL 25 MG/ML IJ SOLN
12.5000 mg | INTRAMUSCULAR | Status: DC | PRN
  Administered 2018-04-17 – 2018-04-18 (×2): 12.5 mg via INTRAVENOUS
  Filled 2018-04-16 (×2): qty 1

## 2018-04-16 MED ORDER — THROMBIN 20000 UNITS EX SOLR
CUTANEOUS | Status: DC | PRN
  Administered 2018-04-16: 20 mL via TOPICAL

## 2018-04-16 MED ORDER — ONDANSETRON HCL 4 MG PO TABS: 4.0000 mg | ORAL_TABLET | ORAL | Status: DC | PRN

## 2018-04-16 MED ORDER — PROPOFOL 10 MG/ML IV BOLUS
INTRAVENOUS | Status: AC
  Filled 2018-04-16: qty 20

## 2018-04-16 MED ORDER — LABETALOL HCL 5 MG/ML IV SOLN
INTRAVENOUS | Status: DC | PRN
  Administered 2018-04-16: 5 mg via INTRAVENOUS

## 2018-04-16 MED ORDER — SODIUM CHLORIDE 0.9 % IV SOLN
INTRAVENOUS | Status: DC | PRN
  Administered 2018-04-16: 500 mL

## 2018-04-16 MED ORDER — POLYVINYL ALCOHOL 1.4 % OP SOLN
1.0000 [drp] | Freq: Four times a day (QID) | OPHTHALMIC | Status: DC
  Administered 2018-04-16 – 2018-04-19 (×7): 1 [drp] via OPHTHALMIC
  Filled 2018-04-16: qty 15

## 2018-04-16 MED ORDER — LABETALOL HCL 5 MG/ML IV SOLN
10.0000 mg | INTRAVENOUS | Status: DC | PRN
  Administered 2018-04-16: 40 mg via INTRAVENOUS
  Administered 2018-04-16: 20 mg via INTRAVENOUS
  Administered 2018-04-16: 40 mg via INTRAVENOUS
  Administered 2018-04-16 – 2018-04-17 (×5): 20 mg via INTRAVENOUS
  Administered 2018-04-17: 10 mg via INTRAVENOUS
  Administered 2018-04-18 (×5): 20 mg via INTRAVENOUS
  Filled 2018-04-16: qty 8
  Filled 2018-04-16: qty 4
  Filled 2018-04-16: qty 8
  Filled 2018-04-16 (×4): qty 4
  Filled 2018-04-16: qty 8
  Filled 2018-04-16 (×5): qty 4

## 2018-04-16 MED ORDER — CHLORHEXIDINE GLUCONATE CLOTH 2 % EX PADS: 6.0000 | MEDICATED_PAD | Freq: Once | CUTANEOUS | Status: DC

## 2018-04-16 MED ORDER — ACETAMINOPHEN 325 MG PO TABS: 650.0000 mg | ORAL_TABLET | ORAL | Status: DC | PRN

## 2018-04-16 MED ORDER — HYDROMORPHONE HCL 1 MG/ML IJ SOLN: 0.5000 mg | INTRAMUSCULAR | Status: DC | PRN

## 2018-04-16 MED ORDER — SODIUM CHLORIDE 0.9 % IV SOLN
INTRAVENOUS | Status: DC | PRN
  Administered 2018-04-16 (×2): via INTRAVENOUS

## 2018-04-16 MED ORDER — THROMBIN 20000 UNITS EX SOLR
CUTANEOUS | Status: AC
  Filled 2018-04-16: qty 20000

## 2018-04-16 MED ORDER — CYANOCOBALAMIN 500 MCG PO TABS
500.0000 ug | ORAL_TABLET | Freq: Every day | ORAL | Status: DC
  Administered 2018-04-17 – 2018-04-19 (×2): 500 ug via ORAL
  Filled 2018-04-16 (×5): qty 1

## 2018-04-16 MED ORDER — 0.9 % SODIUM CHLORIDE (POUR BTL) OPTIME
TOPICAL | Status: DC | PRN
  Administered 2018-04-16 (×3): 1000 mL

## 2018-04-16 MED ORDER — PROPOFOL 500 MG/50ML IV EMUL
INTRAVENOUS | Status: DC | PRN
  Administered 2018-04-16: 30 ug/kg/min via INTRAVENOUS

## 2018-04-16 MED ORDER — THROMBIN 5000 UNITS EX SOLR
CUTANEOUS | Status: AC
  Filled 2018-04-16: qty 5000

## 2018-04-16 MED ORDER — SUGAMMADEX SODIUM 200 MG/2ML IV SOLN: INTRAVENOUS | Status: DC | PRN

## 2018-04-16 MED ORDER — GLUCOSAMINE 1500 COMPLEX PO CAPS: ORAL_CAPSULE | Freq: Every day | ORAL | Status: DC

## 2018-04-16 MED ORDER — FUROSEMIDE 10 MG/ML IJ SOLN
20.0000 mg | INTRAMUSCULAR | Status: AC
  Administered 2018-04-16 (×2): 20 mg via INTRAVENOUS
  Filled 2018-04-16: qty 4

## 2018-04-16 MED ORDER — HEMOSTATIC AGENTS (NO CHARGE) OPTIME
TOPICAL | Status: DC | PRN
  Administered 2018-04-16: 1 via TOPICAL

## 2018-04-16 MED ORDER — IRBESARTAN 300 MG PO TABS
300.0000 mg | ORAL_TABLET | Freq: Every day | ORAL | Status: DC
  Administered 2018-04-17 – 2018-04-19 (×3): 300 mg via ORAL
  Filled 2018-04-16: qty 2
  Filled 2018-04-16: qty 1
  Filled 2018-04-16: qty 2

## 2018-04-16 MED ORDER — DEXAMETHASONE SODIUM PHOSPHATE 10 MG/ML IJ SOLN
INTRAMUSCULAR | Status: AC
  Filled 2018-04-16: qty 1

## 2018-04-16 MED ORDER — ZINC SULFATE 220 (50 ZN) MG PO CAPS
220.0000 mg | ORAL_CAPSULE | Freq: Every day | ORAL | Status: DC
  Filled 2018-04-16 (×4): qty 1

## 2018-04-16 MED ORDER — HYDROCODONE-ACETAMINOPHEN 5-325 MG PO TABS: 1.0000 | ORAL_TABLET | ORAL | Status: DC | PRN

## 2018-04-16 MED ORDER — FENTANYL CITRATE (PF) 250 MCG/5ML IJ SOLN
INTRAMUSCULAR | Status: AC
  Filled 2018-04-16: qty 5

## 2018-04-16 MED ORDER — ROCURONIUM BROMIDE 10 MG/ML (PF) SYRINGE
PREFILLED_SYRINGE | INTRAVENOUS | Status: DC | PRN
  Administered 2018-04-16: 50 mg via INTRAVENOUS

## 2018-04-16 MED ORDER — LABETALOL HCL 5 MG/ML IV SOLN
INTRAVENOUS | Status: AC
  Administered 2018-04-16: 5 mg via INTRAVENOUS
  Filled 2018-04-16: qty 4

## 2018-04-16 MED ORDER — VITAMIN D 25 MCG (1000 UNIT) PO TABS
4000.0000 [IU] | ORAL_TABLET | Freq: Every day | ORAL | Status: DC
  Administered 2018-04-17 – 2018-04-19 (×2): 4000 [IU] via ORAL
  Filled 2018-04-16 (×3): qty 4

## 2018-04-16 MED ORDER — FENTANYL CITRATE (PF) 250 MCG/5ML IJ SOLN
INTRAMUSCULAR | Status: DC | PRN
  Administered 2018-04-16 (×3): 50 ug via INTRAVENOUS
  Administered 2018-04-16: 25 ug via INTRAVENOUS
  Administered 2018-04-16: 50 ug via INTRAVENOUS
  Administered 2018-04-16: 25 ug via INTRAVENOUS
  Administered 2018-04-16: 50 ug via INTRAVENOUS
  Administered 2018-04-16: 100 ug via INTRAVENOUS
  Administered 2018-04-16 (×2): 50 ug via INTRAVENOUS

## 2018-04-16 MED ORDER — PROMETHAZINE HCL 25 MG PO TABS
12.5000 mg | ORAL_TABLET | ORAL | Status: DC | PRN
  Filled 2018-04-16: qty 1

## 2018-04-16 MED ORDER — CEFAZOLIN SODIUM-DEXTROSE 2-4 GM/100ML-% IV SOLN
2.0000 g | INTRAVENOUS | Status: AC
  Administered 2018-04-16 (×2): 2 g via INTRAVENOUS
  Filled 2018-04-16: qty 100

## 2018-04-16 MED ORDER — CEFAZOLIN SODIUM-DEXTROSE 2-4 GM/100ML-% IV SOLN
2.0000 g | Freq: Two times a day (BID) | INTRAVENOUS | Status: AC
  Administered 2018-04-16 – 2018-04-17 (×2): 2 g via INTRAVENOUS
  Filled 2018-04-16 (×2): qty 100

## 2018-04-16 MED ORDER — BACITRACIN ZINC 500 UNIT/GM EX OINT
TOPICAL_OINTMENT | CUTANEOUS | Status: DC | PRN
  Administered 2018-04-16: 1 via TOPICAL

## 2018-04-16 MED ORDER — ONDANSETRON HCL 4 MG/2ML IJ SOLN
INTRAMUSCULAR | Status: AC
  Filled 2018-04-16: qty 2

## 2018-04-16 MED ORDER — FAMOTIDINE IN NACL 20-0.9 MG/50ML-% IV SOLN
20.0000 mg | Freq: Two times a day (BID) | INTRAVENOUS | Status: DC
  Administered 2018-04-17 (×2): 20 mg via INTRAVENOUS
  Filled 2018-04-16 (×2): qty 50

## 2018-04-16 MED ORDER — CO Q-10 150 MG PO CAPS: 150.0000 mg | ORAL_CAPSULE | Freq: Every day | ORAL | Status: DC

## 2018-04-16 MED ORDER — SODIUM CHLORIDE 0.9 % IV SOLN
INTRAVENOUS | Status: DC | PRN
  Administered 2018-04-16: 30 ug/min via INTRAVENOUS

## 2018-04-16 MED ORDER — ALPHA-LIPOIC ACID 200 MG PO TABS: 400.0000 mg | ORAL_TABLET | Freq: Every day | ORAL | Status: DC

## 2018-04-16 MED ORDER — POLYETHYLENE GLYCOL 3350 17 G PO PACK: 17.0000 g | PACK | Freq: Every day | ORAL | Status: DC | PRN

## 2018-04-16 MED ORDER — ESMOLOL HCL 100 MG/10ML IV SOLN
INTRAVENOUS | Status: DC | PRN
  Administered 2018-04-16: 40 mg via INTRAVENOUS
  Administered 2018-04-16: 20 mg via INTRAVENOUS
  Administered 2018-04-16: 40 mg via INTRAVENOUS

## 2018-04-16 MED ORDER — ORAL CARE MOUTH RINSE
15.0000 mL | Freq: Two times a day (BID) | OROMUCOSAL | Status: DC
  Administered 2018-04-16 – 2018-04-19 (×6): 15 mL via OROMUCOSAL

## 2018-04-16 MED ORDER — LABETALOL HCL 5 MG/ML IV SOLN
5.0000 mg | INTRAVENOUS | Status: DC | PRN
  Administered 2018-04-16 (×3): 5 mg via INTRAVENOUS

## 2018-04-16 MED ORDER — ONDANSETRON HCL 4 MG/2ML IJ SOLN
INTRAMUSCULAR | Status: DC | PRN
  Administered 2018-04-16 (×2): 4 mg via INTRAVENOUS

## 2018-04-16 MED ORDER — HEPARIN SODIUM (PORCINE) 5000 UNIT/ML IJ SOLN
5000.0000 [IU] | Freq: Three times a day (TID) | INTRAMUSCULAR | Status: DC
  Administered 2018-04-18 – 2018-04-19 (×4): 5000 [IU] via SUBCUTANEOUS
  Filled 2018-04-16 (×4): qty 1

## 2018-04-16 MED ORDER — PHENYLEPHRINE 40 MCG/ML (10ML) SYRINGE FOR IV PUSH (FOR BLOOD PRESSURE SUPPORT)
PREFILLED_SYRINGE | INTRAVENOUS | Status: DC | PRN
  Administered 2018-04-16 (×2): 40 ug via INTRAVENOUS
  Administered 2018-04-16: 80 ug via INTRAVENOUS

## 2018-04-16 MED ORDER — LABETALOL HCL 5 MG/ML IV SOLN
INTRAVENOUS | Status: AC
  Filled 2018-04-16: qty 4

## 2018-04-16 MED ORDER — PROPOFOL 10 MG/ML IV BOLUS
INTRAVENOUS | Status: DC | PRN
  Administered 2018-04-16: 20 mg via INTRAVENOUS
  Administered 2018-04-16: 80 mg via INTRAVENOUS
  Administered 2018-04-16: 40 mg via INTRAVENOUS
  Administered 2018-04-16: 200 mg via INTRAVENOUS
  Administered 2018-04-16: 20 mg via INTRAVENOUS

## 2018-04-16 MED ORDER — SODIUM CHLORIDE 0.9 % IV SOLN
INTRAVENOUS | Status: DC | PRN
  Administered 2018-04-16: 09:00:00 via INTRAVENOUS

## 2018-04-16 MED ORDER — ONDANSETRON HCL 4 MG/2ML IJ SOLN
4.0000 mg | INTRAMUSCULAR | Status: DC | PRN
  Administered 2018-04-16 – 2018-04-17 (×5): 4 mg via INTRAVENOUS
  Filled 2018-04-16 (×5): qty 2

## 2018-04-16 MED ORDER — BACITRACIN ZINC 500 UNIT/GM EX OINT
TOPICAL_OINTMENT | CUTANEOUS | Status: AC
  Filled 2018-04-16: qty 28.35

## 2018-04-16 MED ORDER — LACTATED RINGERS IV SOLN
INTRAVENOUS | Status: DC | PRN
  Administered 2018-04-16: 08:00:00 via INTRAVENOUS

## 2018-04-16 MED ORDER — ARNICA MONTANA PO PLLT: PELLET | Freq: Two times a day (BID) | ORAL | Status: DC | PRN

## 2018-04-16 MED ORDER — AMLODIPINE BESYLATE 5 MG PO TABS
5.0000 mg | ORAL_TABLET | Freq: Every day | ORAL | Status: DC
  Filled 2018-04-16: qty 1

## 2018-04-16 MED ORDER — TRAUMEEL EX GEL: Freq: Two times a day (BID) | CUTANEOUS | Status: DC | PRN

## 2018-04-16 MED ORDER — LIDOCAINE-EPINEPHRINE 1 %-1:100000 IJ SOLN
INTRAMUSCULAR | Status: DC | PRN
  Administered 2018-04-16: 8 mL

## 2018-04-16 SURGICAL SUPPLY — 112 items
BATTERY IQ STERILE (MISCELLANEOUS) IMPLANT
BENZOIN TINCTURE PRP APPL 2/3 (GAUZE/BANDAGES/DRESSINGS) ×2 IMPLANT
BLADE CLIPPER SURG (BLADE) ×4 IMPLANT
BLADE SAW GIGLI 16 STRL (MISCELLANEOUS) IMPLANT
BLADE SURG 15 STRL LF DISP TIS (BLADE) IMPLANT
BLADE SURG 15 STRL SS (BLADE)
BNDG GAUZE ELAST 4 BULKY (GAUZE/BANDAGES/DRESSINGS) IMPLANT
BNDG STRETCH 4X75 STRL LF (GAUZE/BANDAGES/DRESSINGS) IMPLANT
BUR ACORN 9.0 PRECISION (BURR) ×3 IMPLANT
BUR ACORN 9.0MM PRECISION (BURR) ×1
BUR ROUND FLUTED 4 SOFT TCH (BURR) IMPLANT
BUR ROUND FLUTED 4MM SOFT TCH (BURR)
BUR SABER DIAMOND 2.5 (BURR) ×2 IMPLANT
BUR SABER DIAMOND 3.0 (BURR) ×2 IMPLANT
BUR SPIRAL ROUTER 2.3 (BUR) ×3 IMPLANT
BUR SPIRAL ROUTER 2.3MM (BUR) ×1
CANISTER SUCT 3000ML PPV (MISCELLANEOUS) ×8 IMPLANT
CATH VENTRIC 35X38 W/TROCAR LG (CATHETERS) IMPLANT
CLIP VESOCCLUDE MED 6/CT (CLIP) IMPLANT
CONT SPEC 4OZ CLIKSEAL STRL BL (MISCELLANEOUS) ×6 IMPLANT
COVER MAYO STAND STRL (DRAPES) IMPLANT
COVER WAND RF STERILE (DRAPES) ×2 IMPLANT
DECANTER SPIKE VIAL GLASS SM (MISCELLANEOUS) ×2 IMPLANT
DRAIN SUBARACHNOID (WOUND CARE) IMPLANT
DRAPE HALF SHEET 40X57 (DRAPES) ×2 IMPLANT
DRAPE MICROSCOPE LEICA (MISCELLANEOUS) ×2 IMPLANT
DRAPE NEUROLOGICAL W/INCISE (DRAPES) ×4 IMPLANT
DRAPE POUCH INSTRU U-SHP 10X18 (DRAPES) ×2 IMPLANT
DRAPE STERI IOBAN 125X83 (DRAPES) IMPLANT
DRAPE SURG 17X23 STRL (DRAPES) IMPLANT
DRAPE WARM FLUID 44X44 (DRAPE) ×4 IMPLANT
DRSG ADAPTIC 3X8 NADH LF (GAUZE/BANDAGES/DRESSINGS) IMPLANT
DRSG TELFA 3X8 NADH (GAUZE/BANDAGES/DRESSINGS) IMPLANT
DURAPREP 6ML APPLICATOR 50/CS (WOUND CARE) ×4 IMPLANT
ELECT REM PT RETURN 9FT ADLT (ELECTROSURGICAL) ×4
ELECTRODE REM PT RTRN 9FT ADLT (ELECTROSURGICAL) ×2 IMPLANT
EVACUATOR 1/8 PVC DRAIN (DRAIN) IMPLANT
EVACUATOR SILICONE 100CC (DRAIN) IMPLANT
FEE INTRAOP MONITOR IMPULS NCS (MISCELLANEOUS) IMPLANT
FORCEPS BIPOLAR SPETZLER 8 1.0 (NEUROSURGERY SUPPLIES) ×4 IMPLANT
GAUZE 4X4 16PLY RFD (DISPOSABLE) IMPLANT
GAUZE SPONGE 4X4 12PLY STRL (GAUZE/BANDAGES/DRESSINGS) IMPLANT
GLOVE BIO SURGEON STRL SZ7 (GLOVE) IMPLANT
GLOVE BIO SURGEON STRL SZ7.5 (GLOVE) ×4 IMPLANT
GLOVE BIOGEL PI IND STRL 6.5 (GLOVE) IMPLANT
GLOVE BIOGEL PI IND STRL 7.0 (GLOVE) IMPLANT
GLOVE BIOGEL PI IND STRL 7.5 (GLOVE) ×2 IMPLANT
GLOVE BIOGEL PI INDICATOR 6.5 (GLOVE) ×2
GLOVE BIOGEL PI INDICATOR 7.0 (GLOVE) ×2
GLOVE BIOGEL PI INDICATOR 7.5 (GLOVE) ×2
GLOVE ECLIPSE 9.0 STRL (GLOVE) ×2 IMPLANT
GLOVE EXAM NITRILE LRG STRL (GLOVE) IMPLANT
GLOVE EXAM NITRILE XL STR (GLOVE) IMPLANT
GLOVE EXAM NITRILE XS STR PU (GLOVE) IMPLANT
GLOVE SURG SS PI 6.0 STRL IVOR (GLOVE) ×2 IMPLANT
GLOVE SURG SS PI 7.5 STRL IVOR (GLOVE) ×12 IMPLANT
GOWN STRL REUS W/ TWL LRG LVL3 (GOWN DISPOSABLE) ×4 IMPLANT
GOWN STRL REUS W/ TWL XL LVL3 (GOWN DISPOSABLE) IMPLANT
GOWN STRL REUS W/TWL 2XL LVL3 (GOWN DISPOSABLE) IMPLANT
GOWN STRL REUS W/TWL LRG LVL3 (GOWN DISPOSABLE) ×8
GOWN STRL REUS W/TWL XL LVL3 (GOWN DISPOSABLE) ×2
GRAFT DURAGEN MATRIX 2WX2L ×2 IMPLANT
HEMOSTAT POWDER KIT SURGIFOAM (HEMOSTASIS) ×6 IMPLANT
HEMOSTAT SURGICEL 2X14 (HEMOSTASIS) ×2 IMPLANT
HOOK DURA 1/2IN (MISCELLANEOUS) ×4 IMPLANT
INTRAOP MONITOR FEE IMPULS NCS (MISCELLANEOUS) ×2
INTRAOP MONITOR FEE IMPULSE (MISCELLANEOUS) ×2
IV NS 1000ML (IV SOLUTION)
IV NS 1000ML BAXH (IV SOLUTION) ×2 IMPLANT
KIT BASIN OR (CUSTOM PROCEDURE TRAY) ×4 IMPLANT
KIT DRAIN CSF ACCUDRAIN (MISCELLANEOUS) IMPLANT
KIT TURNOVER KIT B (KITS) ×4 IMPLANT
MARKER SPHERE PSV REFLC 13MM (MARKER) ×8 IMPLANT
NDL SPNL 18GX3.5 QUINCKE PK (NEEDLE) IMPLANT
NEEDLE HYPO 22GX1.5 SAFETY (NEEDLE) ×4 IMPLANT
NEEDLE SPNL 18GX3.5 QUINCKE PK (NEEDLE) IMPLANT
NS IRRIG 1000ML POUR BTL (IV SOLUTION) ×12 IMPLANT
PACK CRANIOTOMY CUSTOM (CUSTOM PROCEDURE TRAY) ×4 IMPLANT
PAD DRESSING TELFA 3X8 NADH (GAUZE/BANDAGES/DRESSINGS) IMPLANT
PATTIES SURGICAL .25X.25 (GAUZE/BANDAGES/DRESSINGS) IMPLANT
PATTIES SURGICAL .5 X.5 (GAUZE/BANDAGES/DRESSINGS) ×2 IMPLANT
PATTIES SURGICAL .5 X3 (DISPOSABLE) IMPLANT
PATTIES SURGICAL 1/4 X 3 (GAUZE/BANDAGES/DRESSINGS) IMPLANT
PATTIES SURGICAL 1X1 (DISPOSABLE) IMPLANT
PIN MAYFIELD SKULL DISP (PIN) ×4 IMPLANT
PLATE 1.5/0.6 85X53M SM PANEL (Plate) ×2 IMPLANT
RUBBERBAND STERILE (MISCELLANEOUS) ×4 IMPLANT
SCREW SELF DRILL HT 1.5/4MM (Screw) ×18 IMPLANT
SEALANT ADHERUS EXTEND TIP (MISCELLANEOUS) ×2 IMPLANT
SET CARTRIDGE AND TUBING (SET/KITS/TRAYS/PACK) ×2 IMPLANT
SET TUBING W/EXT DISP (INSTRUMENTS) ×2 IMPLANT
SPECIMEN JAR SMALL (MISCELLANEOUS) IMPLANT
SPONGE LAP 4X18 RFD (DISPOSABLE) ×2 IMPLANT
SPONGE NEURO XRAY DETECT 1X3 (DISPOSABLE) IMPLANT
SPONGE SURGIFOAM ABS GEL 100 (HEMOSTASIS) ×4 IMPLANT
STAPLER VISISTAT 35W (STAPLE) ×4 IMPLANT
SUT ETHILON 3 0 FSL (SUTURE) IMPLANT
SUT ETHILON 3 0 PS 1 (SUTURE) IMPLANT
SUT MNCRL AB 3-0 PS2 18 (SUTURE) ×2 IMPLANT
SUT NURALON 4 0 TR CR/8 (SUTURE) ×8 IMPLANT
SUT SILK 0 TIES 10X30 (SUTURE) IMPLANT
SUT VIC AB 2-0 CP2 18 (SUTURE) ×6 IMPLANT
TIP SHEAR CVD EXTENDED 36KH (INSTRUMENTS) ×2 IMPLANT
TIP STRAIGHT 25KHZ (INSTRUMENTS) ×4 IMPLANT
TOWEL GREEN STERILE (TOWEL DISPOSABLE) ×4 IMPLANT
TOWEL GREEN STERILE FF (TOWEL DISPOSABLE) ×4 IMPLANT
TRAY FOLEY MTR SLVR 16FR STAT (SET/KITS/TRAYS/PACK) ×4 IMPLANT
TUBE CONNECTING 12'X1/4 (SUCTIONS)
TUBE CONNECTING 12X1/4 (SUCTIONS) ×2 IMPLANT
UNDERPAD 30X30 (UNDERPADS AND DIAPERS) ×2 IMPLANT
WATER STERILE IRR 1000ML POUR (IV SOLUTION) ×4 IMPLANT
WRENCH TORQUE 36KHZ (INSTRUMENTS) ×2 IMPLANT

## 2018-04-16 NOTE — Anesthesia Procedure Notes (Signed)
Central Venous Catheter Insertion Performed by: Murvin Natal, MD, anesthesiologist Start/End1/10/2018 7:15 AM, 04/16/2018 7:25 AM Patient location: Pre-op. Preanesthetic checklist: patient identified, IV checked, site marked, risks and benefits discussed, surgical consent, monitors and equipment checked, pre-op evaluation, timeout performed and anesthesia consent Position: Trendelenburg Lidocaine 1% used for infiltration Hand hygiene performed  and maximum sterile barriers used  Catheter size: 8 Fr Total catheter length 16. Central line was placed.Double lumen Procedure performed using ultrasound guided technique. Ultrasound Notes:anatomy identified, needle tip was noted to be adjacent to the nerve/plexus identified, no ultrasound evidence of intravascular and/or intraneural injection and image(s) printed for medical record Attempts: 1 Following insertion, dressing applied, line sutured and Biopatch. Post procedure assessment: blood return through all ports, no air and free fluid flow  Patient tolerated the procedure well with no immediate complications.

## 2018-04-16 NOTE — Transfer of Care (Signed)
Immediate Anesthesia Transfer of Care Note  Patient: Natalie Friedman  Procedure(s) Performed: Right craniotomy for tumor resection with brainlab (Right Head) APPLICATION OF CRANIAL NAVIGATION (N/A Head)  Patient Location: PACU  Anesthesia Type:General  Level of Consciousness: awake, alert  and oriented  Airway & Oxygen Therapy: Patient Spontanous Breathing and Patient connected to face mask oxygen  Post-op Assessment: Report given to RN and Post -op Vital signs reviewed and stable  Post vital signs: Reviewed and stable  Last Vitals:  Vitals Value Taken Time  BP 169/98 04/16/2018  2:53 PM  Temp    Pulse 61 04/16/2018  2:55 PM  Resp 19 04/16/2018  2:55 PM  SpO2 84 % 04/16/2018  2:55 PM  Vitals shown include unvalidated device data.  Last Pain:  Vitals:   04/16/18 0630  TempSrc:   PainSc: 2       Patients Stated Pain Goal: 3 (84/83/50 7573)  Complications: No apparent anesthesia complications

## 2018-04-16 NOTE — Anesthesia Procedure Notes (Addendum)
Arterial Line Insertion Start/End1/10/2018 7:11 AM, 04/16/2018 7:20 AM Performed by: Murvin Natal, MD, Bryson Corona, CRNA, CRNA  Patient location: Pre-op. Preanesthetic checklist: patient identified, IV checked, site marked, risks and benefits discussed, surgical consent, monitors and equipment checked, pre-op evaluation, timeout performed and anesthesia consent Lidocaine 1% used for infiltration Left, radial was placed Catheter size: 20 G Hand hygiene performed  and maximum sterile barriers used  Allen's test indicative of satisfactory collateral circulation Attempts: 1 Procedure performed without using ultrasound guided technique. Following insertion, dressing applied. Post procedure assessment: normal  Patient tolerated the procedure well with no immediate complications. Additional procedure comments: Aline placed by Yevonne Aline, [place].Marland Kitchen

## 2018-04-16 NOTE — Anesthesia Procedure Notes (Signed)
Procedure Name: Intubation Date/Time: 04/16/2018 8:07 AM Performed by: Bryson Corona, CRNA Pre-anesthesia Checklist: Patient identified, Emergency Drugs available, Suction available, Patient being monitored and Timeout performed Patient Re-evaluated:Patient Re-evaluated prior to induction Oxygen Delivery Method: Circle system utilized Preoxygenation: Pre-oxygenation with 100% oxygen Induction Type: IV induction Ventilation: Mask ventilation without difficulty Laryngoscope Size: Mac and 3 Grade View: Grade I Tube type: Oral Tube size: 7.0 mm Number of attempts: 1 Airway Equipment and Method: Stylet Placement Confirmation: ETT inserted through vocal cords under direct vision,  positive ETCO2 and breath sounds checked- equal and bilateral Secured at: 22 cm Tube secured with: Tape Dental Injury: Teeth and Oropharynx as per pre-operative assessment

## 2018-04-16 NOTE — Progress Notes (Signed)
Neurosurgery Service Post-operative progress note  Assessment & Plan: 68 y.o. woman s/p rsxn of CP angle mass, recovering well. -HB3 facial palsy, statistically the most likely outcome with this pathology and therefore not concerning -eye gtt until eye closure is complete   Natalie Friedman  04/16/18 6:17 PM

## 2018-04-16 NOTE — Op Note (Addendum)
PATIENT: Natalie Friedman  DAY OF SURGERY: 04/16/18   PRE-OPERATIVE DIAGNOSIS:  Vestibular schwannoma   POST-OPERATIVE DIAGNOSIS:  Vestibular schwannoma   PROCEDURE:  Right retrosigmoid craniotomy for resection of CP angle mass, use of intraoperative neuromonitoring, use of frameless stereotaxy   SURGEON:  Surgeon(s) and Role:    Judith Part, MD - Primary    Earnie Larsson, MD - Assisting   ANESTHESIA: ETGA   BRIEF HISTORY: This is a 68 year old woman who presented with progressive hearing loss, ataxia, and facial numbness. The patient was found to have a mass in her right CP angle, but was unable to have contrast due to her renal disease. This was discussed with the patient as well as risks, benefits, and alternatives and wished to proceed with surgical resection.   OPERATIVE DETAIL: The patient was taken to the operating room and placed on the OR table in the supine position. A formal time out was performed with two patient identifiers and confirmed the operative site. Anesthesia was induced by the anesthesia team. The patient was taken to the operating room and placed on the OR table in the supine position. A formal time out was performed with two patient identifiers and confirmed the operative site. Anesthesia was induced by the anesthesia team. The [place] head holder was applied to the head and a registration array was attached to the [place]. This was co-registered with the patient's preoperative imaging, the fit appeared to be acceptable. Using frameless stereotaxy, the operative trajectory was planned and the incision was marked. Hair was clipped with surgical clippers over the incision and the area was then prepped and draped in a sterile fashion.  A linear incision was placed for a right retrosigmoid craniotomy. Dissection was performed to bone and a craniectomy was performed to expose the transverse-sigmoid junction. Air cells were waxed off and hemostasis was obtained. A durotomy  was performed and CSF was drained. Upon entry to the CP angle, the 7th/8th nerve complex was seen and intact. The facial nerve was stimulated and course confirmed. The mass was stimulated and found to have no areas that activated facial muscles or muscles of mastication.   A tissue sample was sent for frozen, which was consistent with a schwannoma. The mass was then debulked and resected. This was rather difficult due to significant drainage into the superior petrosal sinus and high tumor vascularity. There was also a very poor plane with the brainstem and the tumor was very adherent. The tumor was also adherent to the AICA and SCA, which had to be skeletonized to remove the tumor. After the tumor was resected, there was minimal residual capsule on the trigeminal nerve and the superior petrosal vein. Hemostasis was obtained, the dura was closed and a graft placed over it (Duragen) followed by fibrin glue (Adheris). A titanium mesh cranioplasty was performed and the incision was closed in layers.  All instrument and sponge counts were correct. The patient was then returned to anesthesia for emergence. No apparent complications at the completion of the procedure.   EBL:  585mL   DRAINS: none   SPECIMENS: Right CP angle tumor   Judith Part, MD 04/16/18 2:33 PM

## 2018-04-16 NOTE — H&P (Signed)
Surgical H&P Update  HPI: 68 y.o. woman with 2-3y of progressive difficulty ambulating due to imbalance and ataxia with hearing loss. Workup revealed a right CP angle mass c/w vestibular schwannoma. No changes in health since she was last seen. Still having symptoms and wishes to proceed with surgery.  PMHx:  Past Medical History:  Diagnosis Date  . Anxiety   . Arthritis   . CHF (congestive heart failure) ([place])   . Chronic kidney disease   . Depression   . Diabetes mellitus without complication ([place])   . Hypertension   . Neuropathy   . Occult spina bifida    FamHx: History reviewed. No pertinent family history. SocHx:  reports that she has never smoked. She has never used smokeless tobacco. She reports that she does not drink alcohol or use drugs.  Physical Exam: AOx3, PERRL, FS with mild right facial numbness, R ear grossly without hearing  Strength 5/5 x4, SILTx4, no drift  Assesment/Plan: 68 y.o. woman with large vestibular schwannoma, here for resection. Risks, benefits, and alternatives discussed and the patient would like to continue with surgery. -OR today -4N post-op  Judith Part, MD 04/16/18 6:59 AM

## 2018-04-16 NOTE — Anesthesia Postprocedure Evaluation (Signed)
Anesthesia Post Note  Patient: Natalie Friedman  Procedure(s) Performed: Right craniotomy for tumor resection with brainlab (Right Head) APPLICATION OF CRANIAL NAVIGATION (N/A Head)     Patient location during evaluation: PACU Anesthesia Type: General Level of consciousness: awake Pain management: pain level controlled Vital Signs Assessment: post-procedure vital signs reviewed and stable Respiratory status: spontaneous breathing, nonlabored ventilation, respiratory function stable and patient connected to nasal cannula oxygen Cardiovascular status: blood pressure returned to baseline and stable Postop Assessment: no apparent nausea or vomiting Anesthetic complications: no    Last Vitals:  Vitals:   04/16/18 1700 04/16/18 1715  BP: (!) 164/85 131/67  Pulse:    Resp: (!) 27 20  Temp: 36.6 C   SpO2:      Last Pain:  Vitals:   04/16/18 1700  TempSrc: Oral  PainSc:                  Kuron Docken P Darrel Gloss

## 2018-04-16 NOTE — Brief Op Note (Addendum)
04/16/2018  2:32 PM  PATIENT:  Natalie Friedman  68 y.o. female  PRE-OPERATIVE DIAGNOSIS:  Vestibular schwannoma  POST-OPERATIVE DIAGNOSIS:  Vestibular schwannoma  PROCEDURE:  Procedure(s) with comments: Right craniotomy for tumor resection with brainlab (Right) - right APPLICATION OF CRANIAL NAVIGATION (N/A)  SURGEON:  Surgeon(s) and Role:    * Miyo Aina, Joyice Faster, MD - Primary    * Earnie Larsson, MD - Assisting  ANESTHESIA:   general  EBL:  500 mL   BLOOD ADMINISTERED:none  DRAINS: none   LOCAL MEDICATIONS USED:  LIDOCAINE   SPECIMEN:  Right vestibular schwannoma  DISPOSITION OF SPECIMEN:  PATHOLOGY  COUNTS:  YES  TOURNIQUET:  * No tourniquets in log *  DICTATION: .Note written in EPIC  PLAN OF CARE: Admit to inpatient   PATIENT DISPOSITION:  PACU - hemodynamically stable.   Delay start of Pharmacological VTE agent (>24hrs) due to surgical blood loss or risk of bleeding: yes

## 2018-04-17 ENCOUNTER — Encounter (HOSPITAL_COMMUNITY): Payer: Self-pay | Admitting: Neurological Surgery

## 2018-04-17 LAB — RENAL FUNCTION PANEL
Albumin: 2.6 g/dL — ABNORMAL LOW (ref 3.5–5.0)
Anion gap: 9 (ref 5–15)
BUN: 32 mg/dL — AB (ref 8–23)
CO2: 24 mmol/L (ref 22–32)
Calcium: 9.2 mg/dL (ref 8.9–10.3)
Chloride: 108 mmol/L (ref 98–111)
Creatinine, Ser: 2.54 mg/dL — ABNORMAL HIGH (ref 0.44–1.00)
GFR calc Af Amer: 22 mL/min — ABNORMAL LOW (ref >60)
GFR calc non Af Amer: 19 mL/min — ABNORMAL LOW (ref >60)
Glucose, Bld: 252 mg/dL — ABNORMAL HIGH (ref 70–99)
POTASSIUM: 4.2 mmol/L (ref 3.5–5.1)
Phosphorus: 4.3 mg/dL (ref 2.5–4.6)
Sodium: 141 mmol/L (ref 135–145)

## 2018-04-17 LAB — GLUCOSE, CAPILLARY: Glucose-Capillary: 239 mg/dL — ABNORMAL HIGH (ref 70–99)

## 2018-04-17 MED ORDER — FAMOTIDINE IN NACL 20-0.9 MG/50ML-% IV SOLN
20.0000 mg | INTRAVENOUS | Status: DC
  Administered 2018-04-18: 20 mg via INTRAVENOUS
  Filled 2018-04-17 (×2): qty 50

## 2018-04-17 MED ORDER — MECLIZINE HCL 12.5 MG PO TABS
12.5000 mg | ORAL_TABLET | Freq: Three times a day (TID) | ORAL | Status: DC | PRN
  Administered 2018-04-17 – 2018-04-18 (×2): 12.5 mg via ORAL
  Filled 2018-04-17 (×3): qty 1

## 2018-04-17 MED ORDER — AMLODIPINE BESYLATE 5 MG PO TABS
5.0000 mg | ORAL_TABLET | ORAL | Status: DC
  Administered 2018-04-17 – 2018-04-18 (×2): 5 mg via ORAL
  Filled 2018-04-17 (×2): qty 1

## 2018-04-17 MED FILL — Gelatin Absorbable MT Powder: OROMUCOSAL | Qty: 1 | Status: AC

## 2018-04-17 MED FILL — Thrombin (Recombinant) For Soln 5000 Unit: CUTANEOUS | Qty: 5000 | Status: AC

## 2018-04-17 MED FILL — Thrombin For Soln 5000 Unit: CUTANEOUS | Qty: 5000 | Status: AC

## 2018-04-17 NOTE — Progress Notes (Signed)
Neurosurgery Service Progress Note  Subjective: No acute events overnight, some nausea and dizziness, otherwise no complaints.   Objective: Vitals:   04/17/18 0300 04/17/18 0400 04/17/18 0500 04/17/18 0600  BP: (!) 155/77   (!) 155/73  Pulse: 93 92 92 98  Resp: (!) 22 (!) 21 20 (!) 21  Temp:  98.3 F (36.8 C)    TempSrc:  Oral    SpO2: 97% 98% 93% 98%  Weight:      Height:       Temp (24hrs), Avg:97.9 F (36.6 C), Min:97.6 F (36.4 C), Max:98.3 F (36.8 C)  CBC Latest Ref Rng & Units 04/16/2018 04/16/2018 04/12/2018  WBC 4.0 - 10.5 K/uL 10.5 - 7.4  Hemoglobin 12.0 - 15.0 g/dL 10.5(L) 9.9(L) 12.4  Hematocrit 36.0 - 46.0 % 32.6(L) 29.0(L) 38.4  Platelets 150 - 400 K/uL 160 - 200   BMP Latest Ref Rng & Units 04/16/2018 04/12/2018 10/03/2016  Glucose 70 - 99 mg/dL - 227(H) 157(H)  BUN 8 - 23 mg/dL - 32(H) 35(H)  Creatinine 0.44 - 1.00 mg/dL 2.23(H) 2.40(H) 1.63(H)  Sodium 135 - 145 mmol/L 138 139 141  Potassium 3.5 - 5.1 mmol/L 4.4 4.2 4.1  Chloride 98 - 111 mmol/L - 105 98(L)  CO2 22 - 32 mmol/L - 24 32  Calcium 8.9 - 10.3 mg/dL - 9.5 9.5    Intake/Output Summary (Last 24 hours) at 04/17/2018 0093 Last data filed at 04/17/2018 0600 Gross per 24 hour  Intake 2916.98 ml  Output 2840 ml  Net 76.98 ml    Current Facility-Administered Medications:  .  acetaminophen (TYLENOL) tablet 650 mg, 650 mg, Oral, Q4H PRN **OR** acetaminophen (TYLENOL) suppository 650 mg, 650 mg, Rectal, Q4H PRN, ,  A, MD .  amLODipine (NORVASC) tablet 5 mg, 5 mg, Oral, Daily, Judith Part, MD, Stopped at 04/16/18 1823 .  ceFAZolin (ANCEF) IVPB 2g/100 mL premix, 2 g, Intravenous, Q12H, Judith Part, MD, Stopped at 04/16/18 2359 .  cholecalciferol (VITAMIN D3) tablet 4,000 Units, 4,000 Units, Oral, Daily, ,  A, MD .  docusate sodium (COLACE) capsule 100 mg, 100 mg, Oral, BID, ,  A, MD .  famotidine (PEPCID) IVPB 20 mg premix, 20 mg, Intravenous, Q12H,  Judith Part, MD, Stopped at 04/17/18 0031 .  [START ON 04/18/2018] heparin injection 5,000 Units, 5,000 Units, Subcutaneous, Q8H, ,  A, MD .  HYDROcodone-acetaminophen (NORCO/VICODIN) 5-325 MG per tablet 1 tablet, 1 tablet, Oral, Q4H PRN, Judith Part, MD .  HYDROmorphone (DILAUDID) injection 0.5 mg, 0.5 mg, Intravenous, Q3H PRN, Judith Part, MD .  irbesartan (AVAPRO) tablet 300 mg, 300 mg, Oral, Daily, , Joyice Faster, MD, Stopped at 04/16/18 1824 .  labetalol (NORMODYNE,TRANDATE) injection 10-40 mg, 10-40 mg, Intravenous, Q10 min PRN, Judith Part, MD, 20 mg at 04/16/18 2351 .  MEDLINE mouth rinse, 15 mL, Mouth Rinse, BID, , Joyice Faster, MD, 15 mL at 04/16/18 2245 .  ondansetron (ZOFRAN) tablet 4 mg, 4 mg, Oral, Q4H PRN **OR** ondansetron (ZOFRAN) injection 4 mg, 4 mg, Intravenous, Q4H PRN, Judith Part, MD, 4 mg at 04/16/18 2346 .  polyethylene glycol (MIRALAX / GLYCOLAX) packet 17 g, 17 g, Oral, Daily PRN, ,  A, MD .  polyvinyl alcohol (LIQUIFILM TEARS) 1.4 % ophthalmic solution 1 drop, 1 drop, Right Eye, QID, , Joyice Faster, MD, 1 drop at 04/16/18 2350 .  promethazine (PHENERGAN) injection 12.5 mg, 12.5 mg, Intravenous, Q4H PRN, , Joyice Faster, MD .  promethazine (PHENERGAN)  tablet 12.5-25 mg, 12.5-25 mg, Oral, Q4H PRN, Judith Part, MD .  vitamin B-12 (CYANOCOBALAMIN) tablet 500 mcg, 500 mcg, Oral, Daily, , Joyice Faster, MD, Stopped at 04/16/18 1824 .  zinc sulfate capsule 220 mg, 220 mg, Oral, Daily, Judith Part, MD, Stopped at 04/16/18 1824   Physical Exam: AOx3, PERRL, gaze neutral, +R HB grade 3 w/ complete eye closure, intact sensation on R, baseline R hearing loss, strength 5/5x4  Assessment & Plan: 68 y.o. 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  -MRI w/o contrast reviewed, very difficult to determine residual  without contrast, but minimal, if any, residual present. Some post-op diffusion changes in the resection bed, no clinically significant hematoma -eye closure complete now, eye gtt prn -d/c A-line / foley, transfer to stepdown -some dizziness, will add meclizine 12.5 tid prn -PT/OT recs  Judith Part  04/17/18 7:14 AM

## 2018-04-17 NOTE — Evaluation (Signed)
Physical Therapy Evaluation Patient Details Name: Natalie Friedman MRN: [id number] DOB: 12-Aug-1950 Today's Date: 04/17/2018   History of Present Illness  68 y.o. female admitted on 04/16/18 for resection of vestibular schwannoma.  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  Clinical Impression  Pt was able to sit EOB today and stand with two person support and RW.  She was too dizzy and nauseated to take pivotal steps to the chair and vomited after returning to supine (despite therapist's best efforts to try to ward off nausea).  She is having double vision and expected spontaneous nystagmus.  Compensatory strategies will be needed for mobility progression and likely some occluded glasses (vs eye patch) to help with the double vision during mobility as well.  Her husband is retired and physically able to help her at discharge.  She would be an excellent inpatient rehab candidate.   PT to follow acutely for deficits listed below.      Follow Up Recommendations CIR    Equipment Recommendations  Wheelchair (measurements PT);Wheelchair cushion (measurements PT)    Recommendations for Other Services Rehab consult     Precautions / Restrictions Precautions Precautions: Fall;Other (comment) Precaution Comments: dizziness and nausea, h/o falls      Mobility  Bed Mobility Overal bed mobility: Needs Assistance Bed Mobility: Rolling;Sidelying to Sit;Sit to Sidelying Rolling: Min assist;+2 for physical assistance(especially to the left) Sidelying to sit: Min assist;+2 for physical assistance     Sit to sidelying: Mod assist;+2 for physical assistance General bed mobility comments: Min assist to roll bil, easier to roll to the right (pt also prefers to sleep/stay on the right side as she is more dizzy to the left), min assist to support trunk and separately legs to get to sitting EOB.  Second person there, but not really  needed.  Mod assist to help lift both legs back into the bed.  Pt relying heavily on bed rail for support during transitions.   Transfers Overall transfer level: Needs assistance Equipment used: Rolling walker (2 wheeled) Transfers: Sit to/from Stand Sit to Stand: Min assist;+2 physical assistance         General transfer comment: Heavy two person min assist to stand from bed with RW.  Assist needed the most once fully upright for stability and assurance that she was ok, not going to fall over.  Pt was too nauseated to take any pivotal steps to the chair.  Attempted one eye occlusion in standing to help with double vision.  Glasses were donned for mobility.          Balance Overall balance assessment: Needs assistance Sitting-balance support: Bilateral upper extremity supported Sitting balance-Leahy Scale: Poor Sitting balance - Comments: min guard assist EOB with (+) sway in sitting.   Postural control: (sway) Standing balance support: Bilateral upper extremity supported Standing balance-Leahy Scale: Poor Standing balance comment: needs support of RW and both therapists.                               Pertinent Vitals/Pain Pain Assessment: Faces Faces Pain Scale: Hurts even more Pain Location: head Pain Descriptors / Indicators: Aching Pain Intervention(s): Limited activity within patient's tolerance;Monitored during session;Repositioned    Home Living Family/patient expects to be discharged to:: Private residence Living Arrangements: Spouse/significant other Available Help at Discharge: Family;Available 24 hours/day Type of Home: House Home Access:  Stairs to enter Entrance Stairs-Rails: None Entrance Stairs-Number of Steps: 2 Home Layout: Able to live on main level with bedroom/bathroom;Two level Home Equipment: Cane - single point;Walker - 2 wheels;Shower seat      Prior Function Level of Independence: Independent with assistive device(s)          Comments: ADLs and IADLs. Uses a cane for functional mobility. Retired and can drive but hasnt in three years.     Hand Dominance   Dominant Hand: Right    Extremity/Trunk Assessment   Upper Extremity Assessment Upper Extremity Assessment: Defer to OT evaluation    Lower Extremity Assessment Lower Extremity Assessment: RLE deficits/detail RLE Deficits / Details: right leg with h/o R TKA, pt able to lift bil legs against gravity, so grossly 3- to 3/5 per functional assessment.  RLE Coordination: decreased gross motor    Cervical / Trunk Assessment Cervical / Trunk Assessment: Other exceptions Cervical / Trunk Exceptions: chart reports "occult spinal biffida"  Communication   Communication: No difficulties  Cognition Arousal/Alertness: Awake/alert Behavior During Therapy: Flat affect Overall Cognitive Status: Within Functional Limits for tasks assessed                                 General Comments: Pt difficult to understand at times, but general conversation is normal, recognizes her husband.       General Comments General comments (skin integrity, edema, etc.): Husband present, supportive during session.  Pt reporting double vision (see OT notes for details) and appropriate spontaneous nystagmus (horizontal).  Pt pre medicated with meclazine, ice towels, cold room, and alcohol prep pads, but still vomited after returning to bed.         Assessment/Plan    PT Assessment Patient needs continued PT services  PT Problem List Decreased strength;Decreased balance;Decreased activity tolerance;Decreased mobility;Decreased coordination;Decreased knowledge of use of DME;Decreased knowledge of precautions;Pain;Obesity       PT Treatment Interventions DME instruction;Gait training;Stair training;Functional mobility training;Therapeutic activities;Therapeutic exercise;Balance training;Neuromuscular re-education;Patient/family education;Wheelchair mobility training     PT Goals (Current goals can be found in the Care Plan section)  Acute Rehab PT Goals Patient Stated Goal: to do her best to get better, decrease nausea, dizziness and double vision PT Goal Formulation: With patient/family Time For Goal Achievement: 05/01/18 Potential to Achieve Goals: Good    Frequency Min 3X/week        Co-evaluation PT/OT/SLP Co-Evaluation/Treatment: Yes Reason for Co-Treatment: Complexity of the patient's impairments (multi-system involvement);Necessary to address cognition/behavior during functional activity;For patient/therapist safety;To address functional/ADL transfers PT goals addressed during session: Mobility/safety with mobility;Balance;Proper use of DME;Strengthening/ROM         AM-PAC PT "6 Clicks" Mobility  Outcome Measure Help needed turning from your back to your side while in a flat bed without using bedrails?: A Little Help needed moving from lying on your back to sitting on the side of a flat bed without using bedrails?: A Little Help needed moving to and from a bed to a chair (including a wheelchair)?: A Lot Help needed standing up from a chair using your arms (e.g., wheelchair or bedside chair)?: A Little Help needed to walk in hospital room?: A Lot Help needed climbing 3-5 steps with a railing? : Total 6 Click Score: 14    End of Session Equipment Utilized During Treatment: Gait belt Activity Tolerance: Other (comment)(by nausea and dizziness mostly) Patient left: in bed;with call bell/phone within reach;with bed alarm set;with  nursing/sitter in room Nurse Communication: Mobility status PT Visit Diagnosis: Dizziness and giddiness (R42);Other symptoms and signs involving the nervous system (R29.898);Difficulty in walking, not elsewhere classified (R26.2);Pain Pain - Right/Left: Right Pain - part of body: (head)    Time: 3383-2919 PT Time Calculation (min) (ACUTE ONLY): 42 min   Charges:         Wells Guiles B. [place], PT, DPT   Acute Rehabilitation [phone] pager #[phone] office   PT Evaluation $PT Eval Moderate Complexity: 1 Mod         04/17/2018, 12:46 PM

## 2018-04-17 NOTE — Evaluation (Signed)
Occupational Therapy Evaluation Patient Details Name: Natalie Friedman MRN: [id number] DOB: July 13, 1950 Today's Date: 04/17/2018    History of Present Illness 68 y.o. female admitted on 04/16/18 for resection of vestibular schwannoma.  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   Clinical Impression   PTA, pt was living with her husband and performed ADLs and used [facility] for functional mobility. Pt currently requiring Min A for UB ADLs, Max A for LB ADLs, and Min A +2 for sit<>stand transfer. Despite nausea and pain, pt is highly motivated to participate in therapy and increase her independence. Pt presenting with decreased strength and balance, diplopia, nystagmus, and nausea. Diplopia reduced when one eye closed and may benefit from occlusion. Upon return to supine, pt vomiting and RN notified. Pt will require further acute OT to facilitate safe dc. Recommend dc to CIR for intensive OT to optimize safety, independence with ADLs and functional mobility, and return to PLOF.     Follow Up Recommendations  CIR;Supervision/Assistance - 24 hour    Equipment Recommendations  Other (comment)(Defer to next venue)    Recommendations for Other Services PT consult     Precautions / Restrictions Precautions Precautions: Fall;Other (comment) Precaution Comments: dizziness and nausea, h/o falls      Mobility Bed Mobility Overal bed mobility: Needs Assistance Bed Mobility: Rolling;Sidelying to Sit;Sit to Sidelying Rolling: Min assist;+2 for physical assistance(especially to the left) Sidelying to sit: Min assist;+2 for physical assistance     Sit to sidelying: Mod assist;+2 for physical assistance General bed mobility comments: Min assist to roll bil, easier to roll to the right (pt also prefers to sleep/stay on the right side as she is more dizzy to the left), min assist to support trunk and separately legs to get to  sitting EOB.  Second person there, but not really needed.  Mod assist to help lift both legs back into the bed.  Pt relying heavily on bed rail for support during transitions.   Transfers Overall transfer level: Needs assistance Equipment used: Rolling walker (2 wheeled) Transfers: Sit to/from Stand Sit to Stand: Min assist;+2 physical assistance         General transfer comment: Heavy two person min assist to stand from bed with RW.  Assist needed the most once fully upright for stability and assurance that she was ok, not going to fall over.  Pt was too nauseated to take any pivotal steps to the chair.  Attempted one eye occlusion in standing to help with double vision.  Glasses were donned for mobility.     Balance Overall balance assessment: Needs assistance Sitting-balance support: Bilateral upper extremity supported Sitting balance-Leahy Scale: Poor Sitting balance - Comments: min guard assist EOB with (+) sway in sitting.   Postural control: (sway) Standing balance support: Bilateral upper extremity supported Standing balance-Leahy Scale: Poor Standing balance comment: needs support of RW and both therapists.                             ADL either performed or assessed with clinical judgement   ADL Overall ADL's : Needs assistance/impaired Eating/Feeding: Set up;Supervision/ safety;Sitting Eating/Feeding Details (indicate cue type and reason): Supported sitting Grooming: Wash/dry face;Min Arboriculturist Details (indicate cue type and reason): Min Guard A for safety in sitting to wash her face Upper Body Bathing: Minimal assistance;Sitting   Lower Body Bathing: Maximal assistance;Sit  to/from stand   Upper Body Dressing : Minimal assistance;Sitting   Lower Body Dressing: Maximal assistance;Sit to/from stand       Toileting- Water quality scientist and Hygiene: Total assistance;Bed level Toileting - Clothing Manipulation Details (indicate cue type and  reason): Pt rolling and required Total A for toilet hygiene after bowel incontience in bed     Functional mobility during ADLs: Minimal assistance;+2 for physical assistance(sit<>stand) General ADL Comments: Pt presenting with decreased strength and balance. Pt limited by nausea, dizziness, and nystagmus     Vision Baseline Vision/History: Wears glasses Wears Glasses: At all times Patient Visual Report: Diplopia Vision Assessment?: Vision impaired- to be further tested in functional context Diplopia Assessment: Disappears with one eye closed;Objects split on top of one another Additional Comments: Noting right horizontal pulses and nystagmus. Pt reporting vertical diplopia. Left eye dominant. Diplopa disappears with closing of either eye.      Perception     Praxis      Pertinent Vitals/Pain Pain Assessment: Faces Faces Pain Scale: Hurts even more Pain Location: head Pain Descriptors / Indicators: Aching Pain Intervention(s): Monitored during session;Limited activity within patient's tolerance;Repositioned     Hand Dominance Right   Extremity/Trunk Assessment Upper Extremity Assessment Upper Extremity Assessment: LUE deficits/detail LUE Deficits / Details: Edema at left hand with some decreased strength.  LUE Coordination: decreased fine motor   Lower Extremity Assessment Lower Extremity Assessment: Defer to PT evaluation RLE Deficits / Details: right leg with h/o R TKA, pt able to lift bil legs against gravity, so grossly 3- to 3/5 per functional assessment.  RLE Coordination: decreased gross motor   Cervical / Trunk Assessment Cervical / Trunk Assessment: Other exceptions Cervical / Trunk Exceptions: chart reports "occult spinal biffida"   Communication Communication Communication: No difficulties   Cognition Arousal/Alertness: Awake/alert Behavior During Therapy: Flat affect Overall Cognitive Status: Within Functional Limits for tasks assessed                                  General Comments: Pt difficult to understand at times, but general conversation is normal, recognizes her husband.    General Comments  Husband present, supportive during session.  Pt reporting double vision and appropriate spontaneous nystagmus (horizontal).  Pt pre medicated with meclazine, ice towels, cold room, and alcohol prep pads, but still vomited after returning to bed.     Exercises     Shoulder Instructions      Home Living Family/patient expects to be discharged to:: Private residence Living Arrangements: Spouse/significant other Available Help at Discharge: Family;Available 24 hours/day Type of Home: House Home Access: Stairs to enter [employer] of Steps: 2 Entrance Stairs-Rails: None Home Layout: Able to live on main level with bedroom/bathroom;Two level     Bathroom Shower/Tub: Occupational psychologist: Standard     Home Equipment: Cane - single point;Walker - 2 wheels;Shower seat          Prior Functioning/Environment Level of Independence: Independent with assistive device(s)        Comments: ADLs and IADLs. Uses a cane for functional mobility. Retired and can drive but hasnt in three years.        OT Problem List: Decreased strength;Decreased range of motion;Decreased activity tolerance;Impaired balance (sitting and/or standing);Impaired vision/perception;Decreased knowledge of use of DME or AE;Decreased knowledge of precautions;Pain      OT Treatment/Interventions: Self-care/ADL training;Therapeutic exercise;Energy conservation;DME and/or AE instruction;Therapeutic activities;Patient/family education  OT Goals(Current goals can be found in the care plan section) Acute Rehab OT Goals Patient Stated Goal: to do her best to get better, decrease nausea, dizziness and double vision OT Goal Formulation: With patient/family Time For Goal Achievement: 05/01/18 Potential to Achieve Goals: Good  OT  Frequency: Min 3X/week   Barriers to D/C:            Co-evaluation PT/OT/SLP Co-Evaluation/Treatment: Yes Reason for Co-Treatment: Complexity of the patient's impairments (multi-system involvement);Necessary to address cognition/behavior during functional activity;For patient/therapist safety;To address functional/ADL transfers PT goals addressed during session: Mobility/safety with mobility;Proper use of DME;Strengthening/ROM OT goals addressed during session: ADL's and self-care      AM-PAC OT "6 Clicks" Daily Activity     Outcome Measure Help from another person eating meals?: A Little Help from another person taking care of personal grooming?: A Little Help from another person toileting, which includes using toliet, bedpan, or urinal?: A Lot Help from another person bathing (including washing, rinsing, drying)?: A Lot Help from another person to put on and taking off regular upper body clothing?: A Little Help from another person to put on and taking off regular lower body clothing?: A Lot 6 Click Score: 15   End of Session Equipment Utilized During Treatment: Gait belt Nurse Communication: Mobility status;Other (comment)(Nausea)  Activity Tolerance: Other (comment)(Limited by nausea) Patient left: in bed;with call bell/phone within reach;with bed alarm set;with SCD's reapplied  OT Visit Diagnosis: Other abnormalities of gait and mobility (R26.89);Unsteadiness on feet (R26.81);Muscle weakness (generalized) (M62.81);History of falling (Z91.81);Pain Pain - part of body: (Head)                Time: 0981-1914 OT Time Calculation (min): 43 min Charges:  OT General Charges $OT Visit: 1 Visit OT Evaluation $OT Eval Moderate Complexity: 1 Mod OT Treatments $Self Care/Home Management : 8-22 mins  Stepan Verrette MSOT, OTR/L Acute Rehab Pager: [phone] Office: [place] 04/17/2018, 3:02 PM

## 2018-04-17 NOTE — Progress Notes (Signed)
Rehab Admissions Coordinator Note:  Patient was screened by Cleatrice Burke for appropriateness for an Inpatient Acute Rehab Consult per PT recs.  At this time, we are recommending Inpatient Rehab consult.  Cleatrice Burke 04/17/2018, 1:58 PM  I can be reached at [phone].

## 2018-04-18 ENCOUNTER — Encounter (HOSPITAL_COMMUNITY): Payer: Self-pay | Admitting: Physical Medicine and Rehabilitation

## 2018-04-18 DIAGNOSIS — R270 Ataxia, unspecified: Secondary | ICD-10-CM

## 2018-04-18 DIAGNOSIS — D333 Benign neoplasm of cranial nerves: Principal | ICD-10-CM

## 2018-04-18 MED ORDER — TAMSULOSIN HCL 0.4 MG PO CAPS
0.4000 mg | ORAL_CAPSULE | Freq: Every day | ORAL | Status: DC
  Administered 2018-04-18: 0.4 mg via ORAL
  Filled 2018-04-18: qty 1

## 2018-04-18 NOTE — Social Work (Signed)
CSW acknowledging consult for SNF placement. Following along with CIR for assistance with disposition.   Westley Hummer, MSW, [place] Work [phone]

## 2018-04-18 NOTE — Progress Notes (Signed)
Inpatient Rehabilitation Admissions Coordinator  I will begin insurance authorization with [place] for a possible inpt rehab admit pending insurance approval when pt medically ready for d/c. I will follow up tomorrow.  Danne Baxter, RN, MSN Rehab Admissions Coordinator [phone] 04/18/2018 3:32 PM

## 2018-04-18 NOTE — Progress Notes (Signed)
PT Cancellation Note  Patient Details Name: Natalie Friedman MRN: [id number] DOB: [birth year]   Cancelled Treatment:    Reason Eval/Treat Not Completed: Patient at procedure or test/unavailable.  Pt in a procedure and getting ready to move to progressive unit.  Will see as able today. 04/18/2018  Donnella Sham, PT Acute Rehabilitation Services [phone]  (pager) [phone]  (office)   Natalie Friedman 04/18/2018, 1:19 PM

## 2018-04-18 NOTE — Consult Note (Addendum)
Physical Medicine and Rehabilitation Consult   Reason for Consult: Functional deficits post schwannoma resection. Referring Physician:  Dr. Zada Finders.    HPI: Natalie Friedman is a 68 y.o. female with history of OA, CKD, T2DM, 2-3 years history of ataxia with difficulty walking, double vision, hearing loss and recent falls due to imbalance. 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 Zada Finders.  Post with right facial palsy and dysarthria. Therapy evaluation done yesterday revealing diplopia, N/V and nystagmus affecting gait and ADL tasks. CIR recommended due to functional decline.    Review of Systems  Constitutional: Negative for chills and fever.  HENT: Positive for hearing loss (right ear).   Eyes: Positive for blurred vision and double vision.  Respiratory: Positive for shortness of breath. Negative for cough.   Cardiovascular: Negative for chest pain and palpitations.  Gastrointestinal: Positive for nausea and vomiting. Negative for abdominal pain.  Genitourinary: Negative for dysuria and urgency.  Musculoskeletal: Positive for back pain (gets chiropractic adjustments every few monts), falls (for the past few weeks) and myalgias.  Skin: Positive for itching (in hands and feet).  Neurological: Positive for dizziness, sensory change, speech change and weakness. Negative for headaches.  Psychiatric/Behavioral: The patient does not have insomnia.      Past Medical History:  Diagnosis Date  . Anxiety   . Arthritis   . CHF (congestive heart failure) ([place])   . Chronic kidney disease   . Depression   . Diabetes mellitus without complication ([place])   . Hypertension   . Neuropathy   . Occult spina bifida     Past Surgical History:  Procedure Laterality Date  . APPLICATION OF CRANIAL NAVIGATION N/A 04/16/2018   Procedure: APPLICATION OF CRANIAL NAVIGATION;   Surgeon: Judith Part, MD;  Location: [place];  Service: Neurosurgery;  Laterality: N/A;  . CRANIOTOMY Right 04/16/2018   Procedure: Right craniotomy for tumor resection with brainlab;  Surgeon: Judith Part, MD;  Location: [place];  Service: Neurosurgery;  Laterality: Right;  right  . ectopic    . INCISION AND DRAINAGE Right 10/04/2016   Procedure: INCISION AND DRAINAGE AND ASPIRATION;  Surgeon: Carole Civil, MD;  Location: [facility];  Service: Orthopedics;  Laterality: Right;  right knee wound  . patella fx Right 07/2015  . TOTAL KNEE ARTHROPLASTY Right 07/20/2016   Procedure: TOTAL KNEE ARTHROPLASTY;  Surgeon: Carole Civil, MD;  Location: [facility];  Service: Orthopedics;  Laterality: Right;    Family History  Problem Relation Age of Onset  . Cancer Mother        colon  . Cancer - Colon Father      Social History:  Married. 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 Has had difficulty with mobility since TKR 3 years ago. Husband in good health. She reports that she has never smoked. She has never used smokeless tobacco. She reports that she does not drink alcohol or use drugs.    Allergies  Allergen Reactions  . Corticosteroids Other (See Comments)    Hallucinations/swelling in legs & feet/passed out (ORAL ROUTE ONLY PER PATIENT)   . Gluten Meal Other (See Comments)    Over time increases inflammation in body  . Oxycodone Other (See Comments)    TOO STRONG, RESPIRATORY DEPRESSION  Facility-Administered Medications Prior to Admission  Medication Dose Route Frequency Provider Last Rate Last Dose  . doxycycline (VIBRA-TABS) tablet 100 mg  100 mg Oral Q12H Carole Civil, MD       Medications Prior to Admission  Medication Sig Dispense Refill  . Alpha-Lipoic Acid 200 MG TABS Take 400 mg by mouth daily.     Marland Kitchen amLODipine (NORVASC) 5 MG tablet Take 5 mg by mouth daily.    . Cholecalciferol (VITAMIN D) 50 MCG  (2000 UT) CAPS Take 4,000 Units by mouth daily.    . Coenzyme Q10 (COQ10) 150 MG CAPS Take 150 mg by mouth daily.    . Glucosamine-Chondroit-Vit C-Mn (GLUCOSAMINE 1500 COMPLEX PO) Take 1 tablet by mouth daily.    . Homeopathic Products (ARNICA [place] PO) Take 1 tablet by mouth 2 (two) times daily as needed (pain).    Marland Kitchen olmesartan (BENICAR) 40 MG tablet Take 40 mg by mouth daily.  0  . vitamin B-12 (CYANOCOBALAMIN) 500 MCG tablet Take 500 mcg by mouth daily.    . Zinc 30 MG TABS Take 30 mg by mouth daily.    Marland Kitchen aspirin EC 325 MG EC tablet Take 1 tablet (325 mg total) by mouth daily with breakfast. (Patient not taking: Reported on 06/06/2017) 30 tablet 0  . doxycycline (VIBRAMYCIN) 100 MG capsule Take 1 capsule (100 mg total) by mouth 2 (two) times daily. (Patient not taking: Reported on 04/01/2018) 56 capsule 1  . Homeopathic Products (TRAUMEEL EX) Apply 1 application topically 2 (two) times daily as needed (pain).    . insulin detemir (LEVEMIR) 100 UNIT/ML injection Inject 0.1 mLs (10 Units total) into the skin at bedtime. (Patient not taking: Reported on 03/09/2017) 10 mL 11  . losartan (COZAAR) 100 MG tablet Take 1 tablet (100 mg total) by mouth daily. (Patient not taking: Reported on 10/03/2017) 30 tablet 0  . potassium chloride SA (K-DUR,KLOR-CON) 10 MEQ tablet Take 1 tablet (10 mEq total) by mouth daily. (Patient not taking: Reported on 03/09/2017) 30 tablet 0  . torsemide (DEMADEX) 20 MG tablet Take 2 tablets (40 mg total) by mouth daily. (Patient taking differently: Take 40 mg by mouth daily as needed (edema). ) 30 tablet 0    Home: Home Living Family/patient expects to be discharged to:: Private residence Living Arrangements: Spouse/significant other Available Help at Discharge: Family, Available 24 hours/day Type of Home: House Home Access: Stairs to enter [employer] of Steps: 2 Entrance Stairs-Rails: None Home Layout: Able to live on main level with bedroom/bathroom, Two  level Bathroom Shower/Tub: Multimedia programmer: Standard Home Equipment: [place] - single point, Environmental consultant - 2 wheels, Shower seat  Functional History: Prior Function Level of Independence: Independent with assistive device(s) Comments: ADLs and IADLs. Uses a cane for functional mobility. Retired and can drive but hasnt in three years. Functional Status:  Mobility: Bed Mobility Overal bed mobility: Needs Assistance Bed Mobility: Rolling, Sidelying to Sit, Sit to Sidelying Rolling: Min assist, +2 for physical assistance(especially to the left) Sidelying to sit: Min assist, +2 for physical assistance Sit to sidelying: Mod assist, +2 for physical assistance General bed mobility comments: Min assist to roll bil, easier to roll to the right (pt also prefers to sleep/stay on the right side as she is more dizzy to the left), min assist to support trunk and separately legs to get to sitting EOB.  Second person there, but not really needed.  Mod assist to help lift both legs back into the bed.  Pt relying heavily on bed rail for support during transitions.  Transfers Overall transfer level: Needs assistance Equipment used: Rolling walker (2 wheeled) Transfers: Sit to/from Stand Sit to Stand: Min assist, +2 physical assistance General transfer comment: Heavy two person min assist to stand from bed with RW.  Assist needed the most once fully upright for stability and assurance that she was ok, not going to fall over.  Pt was too nauseated to take any pivotal steps to the chair.  Attempted one eye occlusion in standing to help with double vision.  Glasses were donned for mobility.       ADL: ADL Overall ADL's : Needs assistance/impaired Eating/Feeding: Set up, Supervision/ safety, Sitting Eating/Feeding Details (indicate cue type and reason): Supported sitting Grooming: Dance movement psychotherapist, Min guard, Sitting Grooming Details (indicate cue type and reason): Min Guard A for safety in sitting to  wash her face Upper Body Bathing: Minimal assistance, Sitting Lower Body Bathing: Maximal assistance, Sit to/from stand Upper Body Dressing : Minimal assistance, Sitting Lower Body Dressing: Maximal assistance, Sit to/from stand Toileting- Clothing Manipulation and Hygiene: Total assistance, Bed level Toileting - Clothing Manipulation Details (indicate cue type and reason): Pt rolling and required Total A for toilet hygiene after bowel incontience in bed Functional mobility during ADLs: Minimal assistance, +2 for physical assistance(sit<>stand) General ADL Comments: Pt presenting with decreased strength and balance. Pt limited by nausea, dizziness, and nystagmus  Cognition: Cognition Overall Cognitive Status: Within Functional Limits for tasks assessed Orientation Level: Oriented X4 Cognition Arousal/Alertness: Awake/alert Behavior During Therapy: Flat affect Overall Cognitive Status: Within Functional Limits for tasks assessed General Comments: Pt difficult to understand at times, but general conversation is normal, recognizes her husband.    Blood pressure (!) 183/80, pulse 69, temperature 98.6 F (37 C), temperature source Oral, resp. rate 20, height 5\' 6"  (1.676 m), weight 104.8 kg, SpO2 99 %. Physical Exam  Nursing note and vitals reviewed. Constitutional: She is oriented to person, place, and time. She appears well-developed and well-nourished. She is easily aroused. No distress. Nasal cannula in place.  Obese female, NAD  Eyes: Right eye exhibits no discharge. Left eye exhibits no discharge. No scleral icterus.  Musculoskeletal:     Comments: Well healed old R-TKR scar.   Neurological: She is alert, oriented to person, place, and time and easily aroused.  Right facial weakness with moderate dysarthria. Unable to close right eye lid. Slow speech but able to answer orientation questions without difficulty. Reasonable insight and awareness. RUE and RLE 2-3/5. LUE and LLE 3/5.  Senses pain in all 4's.   Skin: She is not diaphoretic.    Results for orders placed or performed during the hospital encounter of 04/16/18 (from the past 24 hour(s))  Glucose, capillary     Status: Abnormal   Collection Time: 04/17/18  5:33 PM  Result Value Ref Range   Glucose-Capillary 239 (H) 70 - 99 mg/dL   X-ray Chest Pa Or Ap  Result Date: 04/16/2018 CLINICAL DATA:  Central line placement. EXAM: CHEST  1 VIEW COMPARISON:  Chest x-ray dated May 13, 2016. FINDINGS: Right internal jugular central venous catheter with the tip in the distal SVC. Stable cardiomegaly and mild pulmonary vascular congestion. Unchanged scarring/atelectasis at the left lung base. No focal consolidation, pleural effusion, or pneumothorax. No acute osseous abnormality. IMPRESSION: 1. Right internal jugular central venous catheter tip in the distal SVC. No pneumothorax. 2. Stable cardiomegaly and mild pulmonary vascular congestion. Electronically Signed   By: Titus Dubin  M.D.   On: 04/16/2018 15:50   Mr Brain Wo Contrast  Result Date: 04/17/2018 CLINICAL DATA:  Follow-up examination status post neoplasm resection. EXAM: MRI HEAD WITHOUT CONTRAST TECHNIQUE: Multiplanar, multiecho pulse sequences of the brain and surrounding structures were obtained without intravenous contrast. COMPARISON:  Prior CT from 04/15/2018 as well as previous brain MRI from 03/22/2018. 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 Small amount of postoperative fluid and/or blood products present within the perimesencephalic and cerebellopontine angle cisterns. Superimposed scattered foci of pneumocephalus seen within this region as well as a few additional scattered foci elsewhere within the brain. Previously identified right CP angle vestibular schwannoma has largely been resected. Small approximately 1 cm focus of somewhat  nodular T2/FLAIR signal abnormality at the deep aspect of the right CP angle adjacent to the right medulla could reflect a small amount of residual tumor, not entirely certain given lack of IV contrast (series 8, image 7). There is likely a small amount of residual tumor within the right IAC itself, which is relatively stable in appearance as compared to preoperative exam. Persistent mass effect on the adjacent right brainstem and brachium pontis which remain mildly effaced and compressed towards the left, similar to slightly improved relative to previous exam. Fourth ventricle mildly distorted but remains patent. Associated T2/FLAIR signal abnormality with restricted diffusion within the adjacent right brachium pontis and lateral right cerebellar hemisphere, consistent with small infarct, predominantly right AICA territory. No frank hemorrhagic transformation. No other complication identified. Appearance of the brain otherwise stable with underlying advanced age-related cerebral atrophy with extensive chronic microvascular ischemic disease. Multiple superimposed remote lacunar infarcts noted within the bilateral basal ganglia and left thalamus. Multiple chronic micro hemorrhages again seen scattered throughout the supratentorial and infratentorial brain, most likely related to poorly controlled hypertension. No other mass lesion. No significant midline shift. No hydrocephalus or ventricular trapping. No unexpected extra-axial collection. Vascular: Major intracranial vascular flow voids maintained. Skull and upper cervical spine: Craniocervical junction within normal limits. Sequelae of interval right retrosigmoid craniotomy. Sinuses/Orbits: Globes and orbital soft tissues within normal limits. Scattered mucosal thickening throughout the paranasal sinuses with trace layering fluid within the left sphenoid sinus. Well-circumscribed 16 mm T2 hypointense lesion at the right ethmoidal air cells again noted, stable.  Postoperative changes noted within the right mastoid air cells. Other: None. IMPRESSION: 1. Postoperative changes from interval right retrosigmoid craniotomy for resection of right cerebellar pontine angle vestibular schwannoma. Normal expected small amount of postoperative fluid and/or blood products within the perimesencephalic and right CP angle cisterns. Persistent mass effect on the adjacent pons and right brachium pontis, stable to slightly improved relative to previous exam. Question small focus of residual tumor at the deep aspect of the right CP angle cistern adjacent to the right medulla as above. Attention on follow-up recommended. 2. Patchy small volume ischemic infarcts within the right brachium pontis and peripheral right cerebellar hemisphere, predominantly right AICA territory. 3. Otherwise relatively stable appearance of the brain. No other acute intracranial abnormality identified. Electronically Signed   By: Jeannine Boga M.D.   On: 04/17/2018 00:45     Assessment/Plan: Diagnosis: right sided vestibular schwannoma s/p right retrosigmoid craniotomy/resection 1. Does the need for close, 24 hr/day medical supervision in concert with the patient's rehab needs make it unreasonable for this patient to be served in a less intensive setting? Yes 2. Co-Morbidities requiring supervision/potential complications: nutrition, pain, post-op considerations, swallowing 3. Due to  bladder management, bowel management, safety, skin/wound care, disease management, medication administration, pain management and patient education, does the patient require 24 hr/day rehab nursing? Yes 4. Does the patient require coordinated care of a physician, rehab nurse, PT (1-2 hrs/day, 5 days/week), OT (1-2 hrs/day, 5 days/week) and SLP (1-2 hrs/day, 5 days/week) to address physical and functional deficits in the context of the above medical diagnosis(es)? Yes Addressing deficits in the following areas: balance,  endurance, locomotion, strength, transferring, bowel/bladder control, bathing, dressing, feeding, grooming, toileting, speech, swallowing and psychosocial support 5. Can the patient actively participate in an intensive therapy program of at least 3 hrs of therapy per day at least 5 days per week? Yes 6. The potential for patient to make measurable gains while on inpatient rehab is excellent 7. Anticipated functional outcomes upon discharge from inpatient rehab are modified independent and supervision  with PT, modified independent and supervision with OT, modified independent and supervision with SLP. 8. Estimated rehab length of stay to reach the above functional goals is: 13-19 days 9. Anticipated D/C setting: Home 10. Anticipated post D/C treatments: HH therapy and Outpatient therapy 11. Overall Rehab/Functional Prognosis: excellent  RECOMMENDATIONS: This patient's condition is appropriate for continued rehabilitative care in the following setting: CIR Patient has agreed to participate in recommended program. Yes Note that insurance prior authorization may be required for reimbursement for recommended care.  Comment: Recommend eye patch or eye taping OD to protect sclera.    Rehab Admissions Coordinator to follow up.  Thanks,  [name], MD, Mellody Drown  I have personally performed a face to face diagnostic evaluation of this patient. Additionally, I have reviewed and concur with the physician assistant's documentation above.    Bary Leriche, PA-C 04/18/2018

## 2018-04-18 NOTE — Consult Note (Signed)
[facility] CM Primary Care Navigator  04/18/2018  Natalie Friedman 08-25-1950 [id number]   Went to see patient at the bedside to identify possible discharge needs but she is currently receiving patient care form RN and NT for now.  Will attempt see patient at another time when she is available in the room.     Addendum:   Went back to see patient but she was fast asleep and no family member at the bedside. RN reports that patient was medicated with phenergan for vomiting as she was transferred out of ICU.  Will see patient at another time when she is more appropriate and stable.     Addendum (04/19/2018):  Met withpatientand husband Natalie Friedman) at the bedside toidentify possible discharge needs.  Patient reports having persistent "dizziness", also with progressive difficulty ambulating due to imbalance and ataxia with hearing loss. Workup revealed a right angle mass with vestibular schwannoma which had led to this admission/ surgery. (status post resection of vestibular schwannoma with expected post-operative facial weakness)  Patient's daughterendorsesDr.John Nevada Crane with [facility] as herprimary care provider.   [facility] [place] [facility].  Patient reportsthat she has been managingher medications at home with husband's assistance using "pill box" system filled once a week.  Her husband has been driving and providing transportation to her doctors' appointments.  Patient lives with husband (retired) who serves as her primary caregiver at home.   Anticipated plan for discharge [place] per therapy recommendationprior to returning home.  Patient and husband voiced understandingto callprimary care provider'soffice whenshereturns backhomefor a post discharge follow-upvisitwithin1- 2 weeksor sooner if needs arise.Patient letter (with PCP's contact  number) was providedasareminder.   Explained topatientand husband regarding [facility] CM services available for health managementandresourcesat home but both denied anypressing needs orconcernsat thispoint. Husband is very attentive of patient's care needs.  Patient reports that health conditions/ needs at home have been well managed between her and husband.  Patientand husband verbalizedunderstandingof needto seek referral from primary care provider to [facility] care management if deemed necessary and appropriatefor anyservicesin the future- onceshereturnsbackhome.   [facility] care management information was provided for futureneeds thatpatientmay have.    For additional questions please contact:  Edwena Felty A. Gaberial Cada, BSN, RN-BC [facility] PRIMARY CARE Navigator Cell: [phone]

## 2018-04-18 NOTE — Progress Notes (Signed)
Neurosurgery Service Progress Note  Subjective: No acute events overnight, nausea / dizziness improved  Objective: Vitals:   04/18/18 0530 04/18/18 0600 04/18/18 0630 04/18/18 0700  BP: (!) 160/134 (!) 173/79 (!) 169/84 (!) 183/80  Pulse:  95 (!) 154 69  Resp:      Temp:      TempSrc:      SpO2:  96% 98% 99%  Weight:      Height:       Temp (24hrs), Avg:98.5 F (36.9 C), Min:98.2 F (36.8 C), Max:99 F (37.2 C)  CBC Latest Ref Rng & Units 04/16/2018 04/16/2018 04/12/2018  WBC 4.0 - 10.5 K/uL 10.5 - 7.4  Hemoglobin 12.0 - 15.0 g/dL 10.5(L) 9.9(L) 12.4  Hematocrit 36.0 - 46.0 % 32.6(L) 29.0(L) 38.4  Platelets 150 - 400 K/uL 160 - 200   BMP Latest Ref Rng & Units 04/17/2018 04/16/2018 04/12/2018  Glucose 70 - 99 mg/dL 252(H) - 227(H)  BUN 8 - 23 mg/dL 32(H) - 32(H)  Creatinine 0.44 - 1.00 mg/dL 2.54(H) 2.23(H) 2.40(H)  Sodium 135 - 145 mmol/L 141 138 139  Potassium 3.5 - 5.1 mmol/L 4.2 4.4 4.2  Chloride 98 - 111 mmol/L 108 - 105  CO2 22 - 32 mmol/L 24 - 24  Calcium 8.9 - 10.3 mg/dL 9.2 - 9.5    Intake/Output Summary (Last 24 hours) at 04/18/2018 0830 Last data filed at 04/18/2018 0145 Gross per 24 hour  Intake 150 ml  Output 1150 ml  Net -1000 ml    Current Facility-Administered Medications:  .  acetaminophen (TYLENOL) tablet 650 mg, 650 mg, Oral, Q4H PRN **OR** acetaminophen (TYLENOL) suppository 650 mg, 650 mg, Rectal, Q4H PRN, Ostergard, Thomas A, MD .  amLODipine (NORVASC) tablet 5 mg, 5 mg, Oral, Q24H, Ostergard, Joyice Faster, MD, 5 mg at 04/17/18 2021 .  cholecalciferol (VITAMIN D3) tablet 4,000 Units, 4,000 Units, Oral, Daily, Judith Part, MD, 4,000 Units at 04/17/18 1100 .  docusate sodium (COLACE) capsule 100 mg, 100 mg, Oral, BID, Ostergard, Thomas A, MD .  famotidine (PEPCID) IVPB 20 mg premix, 20 mg, Intravenous, Q24H, Ostergard, Thomas A, MD .  heparin injection 5,000 Units, 5,000 Units, Subcutaneous, Q8H, Judith Part, MD, 5,000 Units at 04/18/18 [phone] .   HYDROcodone-acetaminophen (NORCO/VICODIN) 5-325 MG per tablet 1 tablet, 1 tablet, Oral, Q4H PRN, Ostergard, Thomas A, MD .  HYDROmorphone (DILAUDID) injection 0.5 mg, 0.5 mg, Intravenous, Q3H PRN, Judith Part, MD .  irbesartan (AVAPRO) tablet 300 mg, 300 mg, Oral, Daily, Ostergard, Thomas A, MD, 300 mg at 04/17/18 1231 .  labetalol (NORMODYNE,TRANDATE) injection 10-40 mg, 10-40 mg, Intravenous, Q10 min PRN, Judith Part, MD, 20 mg at 04/18/18 5284 .  meclizine (ANTIVERT) tablet 12.5 mg, 12.5 mg, Oral, TID PRN, Judith Part, MD, 12.5 mg at 04/17/18 1324 .  MEDLINE mouth rinse, 15 mL, Mouth Rinse, BID, Ostergard, Joyice Faster, MD, 15 mL at 04/17/18 2222 .  ondansetron (ZOFRAN) tablet 4 mg, 4 mg, Oral, Q4H PRN **OR** ondansetron (ZOFRAN) injection 4 mg, 4 mg, Intravenous, Q4H PRN, Judith Part, MD, 4 mg at 04/17/18 2017 .  polyethylene glycol (MIRALAX / GLYCOLAX) packet 17 g, 17 g, Oral, Daily PRN, Ostergard, Thomas A, MD .  polyvinyl alcohol (LIQUIFILM TEARS) 1.4 % ophthalmic solution 1 drop, 1 drop, Right Eye, QID, Judith Part, MD, 1 drop at 04/17/18 2337 .  promethazine (PHENERGAN) injection 12.5 mg, 12.5 mg, Intravenous, Q4H PRN, Judith Part, MD, 12.5 mg at 04/17/18 1721 .  promethazine (PHENERGAN) tablet 12.5-25 mg, 12.5-25 mg, Oral, Q4H PRN, Judith Part, MD .  vitamin B-12 (CYANOCOBALAMIN) tablet 500 mcg, 500 mcg, Oral, Daily, Ostergard, Joyice Faster, MD, 500 mcg at 04/17/18 1101 .  zinc sulfate capsule 220 mg, 220 mg, Oral, Daily, Ostergard, Joyice Faster, MD, Stopped at 04/16/18 1824   Physical Exam: AOx3, PERRL, gaze neutral, +R HB grade 3 w/ complete eye closure, intact sensation on R, baseline R hearing loss, strength 5/5x4  Assessment & Plan: 68 y.o. 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  -mobilize -f/u SLP recs -PT/OT rec rehab, PM&R consult placed, likely CIR  transfer -SCDs/TEDs/SQH -some urinary retention requiring straight cath, will give 3d of flomax  Judith Part  04/18/18 8:30 AM

## 2018-04-18 NOTE — Evaluation (Signed)
Clinical/Bedside Swallow Evaluation Patient Details  Name: Natalie Friedman MRN: [id number] Date of Birth: 03-15-1951  Today's Date: 04/18/2018 Time: SLP Start Time (ACUTE ONLY): 0930 SLP Stop Time (ACUTE ONLY): 1005 SLP Time Calculation (min) (ACUTE ONLY): 35 min  Past Medical History:  Past Medical History:  Diagnosis Date  . Anxiety   . Arthritis   . CHF (congestive heart failure) ([place])   . Chronic kidney disease   . Depression   . Diabetes mellitus without complication ([place])   . Hypertension   . Neuropathy   . Occult spina bifida    Past Surgical History:  Past Surgical History:  Procedure Laterality Date  . APPLICATION OF CRANIAL NAVIGATION N/A 04/16/2018   Procedure: APPLICATION OF CRANIAL NAVIGATION;  Surgeon: Judith Part, MD;  Location: [facility];  Service: Neurosurgery;  Laterality: N/A;  . CRANIOTOMY Right 04/16/2018   Procedure: Right craniotomy for tumor resection with brainlab;  Surgeon: Judith Part, MD;  Location: [facility];  Service: Neurosurgery;  Laterality: Right;  right  . ectopic    . INCISION AND DRAINAGE Right 10/04/2016   Procedure: INCISION AND DRAINAGE AND ASPIRATION;  Surgeon: Carole Civil, MD;  Location: [facility];  Service: Orthopedics;  Laterality: Right;  right knee wound  . patella fx Right 07/2015  . 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  Underwent right retrosigmoid craniotomy and resection 04/16/18. After the tumor was resected, there was minimal residual capsule on the trigeminal nerve.  Has expected post-operative facial weakness. MRI 04/17/18: Postoperative changes from interval right retrosigmoid craniotomy for resection of right cerebellar pontine angle vestibular  schwannoma. Normal expected small amount of postoperative fluid and/or blood products within the perimesencephalic and right CP angle cisterns. Persistent mass effect on the adjacent pons and right brachium pontis, stable to slightly improved relative to previous exam. Question small focus of residual tumor at the deep aspect of the right CP angle cistern adjacent to the right medulla as above.   Assessment / Plan / Recommendation Clinical Impression  Pt presents with a primary oral dysphagia s/p right vestibular schwannoma resection, with anticipated consequences of right CN VII LMN impairment, as well as decreased oral sensation (CN V) due likely to ongoing mass effect on pons per 1/8 MRI.  Tongue appeared edematous and extended slightly to right. Oral care provided prior to assessment, revealing food debris in right lateral sulcus, bleeding from tongue right side with ulceration and teeth marks - pt without awareness. Mouth was cleaned.  Pt assessed with pureed solids and three oz of water.  There were no s/s of pharyngeal deficits; no concerns for aspiration.  Pt had difficulty maintaining oral seal on right and poor sensation of residuals right side.  Recommend diet be altered to purees/ thin liquids pending improvements in sensorimotor function.  Pt instructed to tongue/finger sweep right cheek to remove PO residue during and after meals; use straw left mouth.  Give meds whole in puree.  Speech is dysarthric but intelligible.  SLP will follow briefly for safety/diet progression, pt education. Pt and daughter agree with plan.  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  Underwent right retrosigmoid craniotomy and resection 04/16/18. After the tumor was resected, there was minimal residual capsule on the trigeminal nerve.  Has expected post-operative facial weakness. MRI 04/17/18: Postoperative changes from interval right retrosigmoid craniotomy for resection of right cerebellar pontine angle vestibular schwannoma. Normal expected small amount of postoperative fluid and/or blood products within the perimesencephalic and right CP angle cisterns. Persistent mass effect on the adjacent pons and right brachium pontis, stable to slightly improved relative to previous exam. Question small focus of residual tumor at the deep aspect of the right CP angle cistern adjacent to the right medulla as above. Type of Study: Bedside Swallow Evaluation Previous Swallow Assessment: no Diet Prior to this Study: Regular;Thin liquids Temperature Spikes Noted: No Respiratory Status: Room air History of Recent Intubation: (briefly for procedure) Behavior/Cognition: Alert Oral Cavity Assessment: Other (comment)(food debris in right oral cavity) Oral Care Completed by SLP: Yes Oral Cavity - Dentition: Missing dentition Vision: Functional for self-feeding Self-Feeding Abilities: Able to feed self Patient Positioning: Upright in bed Baseline Vocal Quality: Normal Volitional Cough: Strong Volitional Swallow: Able to elicit    Oral/Motor/Sensory Function Overall Oral Motor/Sensory Function: Moderate impairment Facial ROM: Reduced right;Suspected CN VII (facial) dysfunction Facial Symmetry: Abnormal symmetry right;Suspected CN VII (facial) dysfunction Facial Strength: Reduced right;Suspected CN VII (facial) dysfunction Facial Sensation: Reduced right;Suspected CN V (Trigeminal) dysfunction Lingual  Symmetry: Abnormal symmetry right Velum: Within Functional Limits   Ice Chips Ice chips: Not tested   Thin Liquid Thin Liquid: Within functional limits    Nectar Thick Nectar Thick Liquid: Not tested   Honey Thick Honey Thick Liquid: Not tested   Puree Puree: Impaired Presentation: Spoon Oral Phase Impairments: Reduced labial seal Oral Phase Functional Implications: Right lateral sulci pocketing;Oral residue   Solid     Solid: Not tested      Juan Quam Laurice 04/18/2018,12:39 PM   Estill Bamberg L. Tivis Ringer, [place] Office number [phone] Pager [phone]

## 2018-04-18 NOTE — Progress Notes (Signed)
PT Cancellation Note  Patient Details Name: Natalie Friedman MRN: [id number] DOB: Dec 08, 1950   Cancelled Treatment:    Reason Eval/Treat Not Completed: Patient declined, no reason specified.  Due to pain and nausea.  Now "snowed" with phenergan. 04/18/2018  Donnella Sham, PT Acute Rehabilitation Services [phone]  (pager) [phone]  (office)   Tessie Fass Lamir Racca 04/18/2018, 2:28 PM

## 2018-04-18 NOTE — PMR Pre-admission (Signed)
PMR Admission Coordinator Pre-Admission Assessment  Patient: Natalie Friedman is an 68 y.o., female MRN: [id number] DOB: March 30, 1951 Height: 5\' 6"  (167.6 cm) Weight: 104.8 kg              Insurance Information HMO: yes    PPO:      PCP:      IPA:      80/20:      OTHER: medicare advantage plan PRIMARY: [place] [employer]      Policy#: [id number]      Subscriber: pt CM Name: Anderson Malta      Phone#: [phone]     Fax#: [phone] Pre-Cert#: [id number] approved for 7 days with f/u Dorthula Nettles     Employer: retired Benefits:  Phone #: [phone]     Name: 04/18/2018 Eff. Date: 04/10/2018     Deduct: none      Out of Pocket Max: [phone]      Life Max: none CIR: $225 co pay per admission for has Medicaid      SNF: no co pay days 1 until 20; $176 co pay y per day days 21 until 100 Outpatient: no co pay per visit for had medicaid     Co-Pay: visits per medical neccesity Home Health: 100%      Co-Pay: visits per medical neccesity DME: 80%     Co-Pay: 20% Providers: in network  SECONDARY: Medicaid of [place]      Policy#: [id number]      Subscriber: pt *  Medicaid Application Date:       Case Manager:  Disability Application Date:       Case Worker:   Emergency Contact Information Contact Information    Name Relation Home Work Mobile   [place] Spouse [phone]  [phone]   Clarinda, Obi [phone]       Current Medical History  Patient Admitting Diagnosis: right sided vestibular schwannoma s/p right retrosigmoid craniotomy/resection  History of Present Illness: 68 y.o. female with history of OA, CKD, T2DM, 2-3 years history of ataxia with difficulty walking, double vision, hearing loss and recent falls due to imbalance. She was found to have right CP mass felt to be right vestibular schwannoma and was admitted on 04/16/2018 for right retrosigmoid craniotomy for resection of mass by Dr. Zada Finders.  Post with right facial palsy and dysarthria.  Therapy evaluations revealing diplopia,  N/V and nystagmus affecting gait and ADL tasks.    Past Medical History  Past Medical History:  Diagnosis Date  . Anxiety   . Arthritis   . CHF (congestive heart failure) ([place])   . Chronic kidney disease   . Depression   . Diabetes mellitus without complication ([place])   . Hypertension   . Neuropathy   . Occult spina bifida     Family History  family history includes Cancer in her mother; Cancer - Colon in her father.  Prior Rehab/Hospitalizations:  Has the patient had major surgery during 100 days prior to admission? No  Current Medications   Current Facility-Administered Medications:  .  acetaminophen (TYLENOL) tablet 650 mg, 650 mg, Oral, Q4H PRN **OR** acetaminophen (TYLENOL) suppository 650 mg, 650 mg, Rectal, Q4H PRN, Ostergard, Thomas A, MD .  amLODipine (NORVASC) tablet 5 mg, 5 mg, Oral, Q24H, Ostergard, Joyice Faster, MD, 5 mg at 04/18/18 2108 .  cholecalciferol (VITAMIN D3) tablet 4,000 Units, 4,000 Units, Oral, Daily, Judith Part, MD, 4,000 Units at 04/19/18 [phone] .  docusate sodium (COLACE) capsule 100 mg, 100 mg, Oral, BID, Ostergard,  Joyice Faster, MD .  famotidine (PEPCID) tablet 20 mg, 20 mg, Oral, Daily, Ostergard, Thomas A, MD .  heparin injection 5,000 Units, 5,000 Units, Subcutaneous, Q8H, Judith Part, MD, 5,000 Units at 04/19/18 [phone] .  HYDROcodone-acetaminophen (NORCO/VICODIN) 5-325 MG per tablet 1 tablet, 1 tablet, Oral, Q4H PRN, Ostergard, Thomas A, MD .  HYDROmorphone (DILAUDID) injection 0.5 mg, 0.5 mg, Intravenous, Q3H PRN, Judith Part, MD .  irbesartan (AVAPRO) tablet 300 mg, 300 mg, Oral, Daily, Judith Part, MD, 300 mg at 04/19/18 0924 .  labetalol (NORMODYNE,TRANDATE) injection 10-40 mg, 10-40 mg, Intravenous, Q10 min PRN, Judith Part, MD, 20 mg at 04/18/18 1842 .  meclizine (ANTIVERT) tablet 12.5 mg, 12.5 mg, Oral, TID PRN, Judith Part, MD, 12.5 mg at 04/18/18 1105 .  MEDLINE mouth rinse, 15 mL, Mouth Rinse, BID,  Ostergard, Joyice Faster, MD, 15 mL at 04/19/18 0916 .  ondansetron (ZOFRAN) tablet 4 mg, 4 mg, Oral, Q4H PRN **OR** ondansetron (ZOFRAN) injection 4 mg, 4 mg, Intravenous, Q4H PRN, Judith Part, MD, 4 mg at 04/17/18 2017 .  polyethylene glycol (MIRALAX / GLYCOLAX) packet 17 g, 17 g, Oral, Daily PRN, Ostergard, Thomas A, MD .  polyvinyl alcohol (LIQUIFILM TEARS) 1.4 % ophthalmic solution 1 drop, 1 drop, Right Eye, QID, Ostergard, Joyice Faster, MD, 1 drop at 04/19/18 0913 .  promethazine (PHENERGAN) injection 12.5 mg, 12.5 mg, Intravenous, Q4H PRN, Judith Part, MD, 12.5 mg at 04/18/18 1337 .  promethazine (PHENERGAN) tablet 12.5-25 mg, 12.5-25 mg, Oral, Q4H PRN, Judith Part, MD .  tamsulosin (FLOMAX) capsule 0.4 mg, 0.4 mg, Oral, Daily, Ostergard, Thomas A, MD, 0.4 mg at 04/18/18 1105 .  vitamin B-12 (CYANOCOBALAMIN) tablet 500 mcg, 500 mcg, Oral, Daily, Ostergard, Thomas A, MD, 500 mcg at 04/19/18 9924 .  zinc sulfate capsule 220 mg, 220 mg, Oral, Daily, Judith Part, MD, Stopped at 04/16/18 1824  Patients Current Diet:  Diet Order            DIET - DYS 1 Room service appropriate? Yes; Fluid consistency: Thin  Diet effective now              Precautions / Restrictions Precautions Precautions: Fall, Other (comment) Precaution Comments: dizziness and nausea, h/o falls, double vision Other Brace: glasses with opaque occlusion Restrictions Weight Bearing Restrictions: No   Has the patient had 2 or more falls or a fall with injury in the past year?No once in past year per her report  Prior Activity Level Community (5-7x/wk): Mod I with cane; does not drive  Development worker, international aid / Equipment Home Assistive Devices/Equipment: CBG Meter, Eyeglasses, Cane (specify quad or straight), Blood pressure cuff, Walker (specify type) Home Equipment: Cane - single point, Walker - 2 wheels, Shower seat  Prior Device Use: Indicate devices/aids used by the patient prior to  current illness, exacerbation or injury? cane  Prior Functional Level Prior Function Level of Independence: Independent with assistive device(s) Comments: ADLs and IADLs. Uses a cane for functional mobility. Retired and can drive but hasnt in three years. due to her ataxia  Self Care: Did the patient need help bathing, dressing, using the toilet or eating?  Independent  Indoor Mobility: Did the patient need assistance with walking from room to room (with or without device)? Independent  Stairs: Did the patient need assistance with internal or external stairs (with or without device)? Independent  Functional Cognition: Did the patient need help planning regular tasks such as shopping or  remembering to take medications? Independent  Current Functional Level Cognition  Overall Cognitive Status: Impaired/Different from baseline Current Attention Level: Sustained Orientation Level: Oriented X4 Following Commands: Follows one step commands inconsistently, Follows one step commands with increased time Safety/Judgement: Decreased awareness of safety, Decreased awareness of deficits General Comments: Not sure if pt is not HOH as well making communication difficult (needs repetition).  He expressive speech difficulty doesn't help our ability to comprehend her as well.     Extremity Assessment (includes Sensation/Coordination)  Upper Extremity Assessment: LUE deficits/detail LUE Deficits / Details: Edema at left hand with some decreased strength.  LUE Coordination: decreased fine motor  Lower Extremity Assessment: Defer to PT evaluation RLE Deficits / Details: right leg with h/o R TKA, pt able to lift bil legs against gravity, so grossly 3- to 3/5 per functional assessment.  RLE Coordination: decreased gross motor    ADLs  Overall ADL's : Needs assistance/impaired Eating/Feeding: Set up, Supervision/ safety, Sitting Eating/Feeding Details (indicate cue type and reason): Supported  sitting Grooming: Dance movement psychotherapist, Min guard, Sitting Grooming Details (indicate cue type and reason): Min Guard A for safety in sitting to wash her face Upper Body Bathing: Minimal assistance, Sitting Lower Body Bathing: Maximal assistance, Sit to/from stand Upper Body Dressing : Minimal assistance, Sitting Lower Body Dressing: Maximal assistance, Sit to/from stand Toileting- Clothing Manipulation and Hygiene: Total assistance, Bed level Toileting - Clothing Manipulation Details (indicate cue type and reason): Pt rolling and required Total A for toilet hygiene after bowel incontience in bed Functional mobility during ADLs: Minimal assistance, +2 for physical assistance(sit<>stand) General ADL Comments: Pt presenting with decreased strength and balance. Pt limited by nausea, dizziness, and nystagmus    Mobility  Overal bed mobility: Needs Assistance Bed Mobility: Rolling, Sidelying to Sit Rolling: Min assist Sidelying to sit: Mod assist Sit to sidelying: Mod assist, +2 for physical assistance General bed mobility comments: Min assist to support pt in progressing her legs EOB to get to side lying and to help her find the bed rail with her right hand.  Mod assist at trunk and legs to come to sitting EOB as pt with imbalance during transitions, reaching for end of bed for support and right preference leaning.  Cues to find target on OT's face to focus on to help the dizziness.     Transfers  Overall transfer level: Needs assistance Equipment used: 2 person hand held assist Transfers: Sit to/from Stand, Stand Pivot Transfers Sit to Stand: +2 physical assistance, Mod assist Stand pivot transfers: +2 physical assistance, Mod assist General transfer comment: Two person mod assist to stand and pivot to the recliner chair on pt's left, she continues in standing to have a right lateral lean and has more difficulty moving right leg around during pivotal steps to the chair.      Ambulation / Gait /  Stairs / Wheelchair Mobility  Ambulation/Gait General Gait Details: unable to yet, but progressing that way.  Today is her first day out of bed.      Posture / Balance Dynamic Sitting Balance Sitting balance - Comments: Pt is reliant on bil UE support EOB. Therapist sat on her right as she was tipping (not pushing ) to the right.  Worked on reaching to the left and attempting more midline sitting balance, also worked on targeting in sitting to help with dizziness/nausea.  Blue emesis bag kept very close.  Balance Overall balance assessment: Needs assistance Sitting-balance support: Feet supported, Bilateral upper extremity supported Sitting balance-Leahy  Scale: Poor Sitting balance - Comments: Pt is reliant on bil UE support EOB. Therapist sat on her right as she was tipping (not pushing ) to the right.  Worked on reaching to the left and attempting more midline sitting balance, also worked on targeting in sitting to help with dizziness/nausea.  Blue emesis bag kept very close.  Postural control: Right lateral lean Standing balance support: Bilateral upper extremity supported Standing balance-Leahy Scale: Poor Standing balance comment: two person mod hand held assist with R lateral lean in standing.     Special needs/care consideration BiPAP/CPAP n/a CPM n/a Continuous Drip IV n/a Dialysis n/a Life Vest n/a Oxygen n/a Special Bed n/a Trach Size n/a Wound Vac n/a Skin right head surgical incision Bowel mgmt: continent LBM 04/18/2018 Bladder mgmt: external catheter Diabetic mgmt yes   Previous Home Environment Living Arrangements: Spouse/significant other  Lives With: Spouse Available Help at Discharge: Family, Available 24 hours/day Type of Home: House Home Layout: Two level, Able to live on main level with bedroom/bathroom Home Access: Stairs to enter Entrance Stairs-Rails: None Entrance Stairs-Number of Steps: 2 Bathroom Shower/Tub: Multimedia programmer:  Standard Bathroom Accessibility: Yes How Accessible: Accessible via walker [place]: No  Discharge Living Setting Plans for Discharge Living Setting: Patient's home, Lives with (comment)(spouse) Type of Home at Discharge: House Discharge Home Layout: Two level, Able to live on main level with bedroom/bathroom Discharge Home Access: Stairs to enter Entrance Stairs-Rails: None Entrance Stairs-Number of Steps: 2 Discharge Bathroom Shower/Tub: Walk-in shower Discharge Bathroom Toilet: Handicapped height Discharge Bathroom Accessibility: Yes How Accessible: Accessible via walker Does the patient have any problems obtaining your medications?: No  Social/Family/Support Systems Patient Roles: Spouse, Parent Contact Information: spouse, Aaron Edelman Anticipated Caregiver: spouse Anticipated Ambulance person Information: see above Ability/Limitations of Caregiver: no limitations Caregiver Availability: 24/7 Discharge Plan Discussed with Primary Caregiver: Yes Is Caregiver In Agreement with Plan?: Yes Does Caregiver/Family have Issues with Lodging/Transportation while Pt is in Rehab?: No  Goals/Additional Needs Patient/Family Goal for Rehab: Mod I to supervision with PT, OT, and SLP Expected length of stay: [facility] 13 to 19 days Pt/Family Agrees to Admission and willing to participate: Yes Program Orientation Provided & Reviewed with Pt/Caregiver Including Roles  & Responsibilities: Yes  Decrease burden of Care through IP rehab admission: n/a  Possible need for SNF placement upon discharge: not anticipated  Patient Condition: This patient's condition remains as documented in the consult dated 04/18/2018, in which the Rehabilitation Physician determined and documented that the patient's condition is appropriate for intensive rehabilitative care in an inpatient rehabilitation facility. Will admit to inpatient rehab today.  Preadmission Screen Completed By:  Cleatrice Burke,  04/19/2018 1:03 PM ______________________________________________________________________   Discussed status with Dr. Naaman Plummer on 04/19/2018 at  1302 and received telephone approval for admission today.  Admission Coordinator:  Cleatrice Burke, time 5027 Date 04/19/2018

## 2018-04-18 NOTE — H&P (Signed)
Physical Medicine and Rehabilitation Admission H&P    CC: Functional decline past tumor   HPI: Natalie Friedman is a 68 year old female with history of OA, CKD, T2DM, 2-3 years history of ataxia with difficulty walking progressing to hearing loss on the right, double vision and recent falls. She was found to have right CP mass and was admitted on 04/16/18 for right rectosigmoid craniotomy for resection of mass by Dr. Zada Finders. Post op with right facial palsy, dysarthria as well as dizziness with N/V. Blood pressures have been labile and she continues to requires oxygen due to hypoxia. BSS done and diet downgraded to dysphagia 1 due to oral phase dysphagia with sensorimotor deficits.  Therapy ongoing and patient continues to have limitations in mobility and ability to carry out ADLs. CIR recommended due to functional deficits.    Review of Systems  Constitutional: Negative for chills and fever.  HENT: Positive for hearing loss. Negative for tinnitus.   Eyes: Positive for blurred vision and double vision.  Respiratory: Negative for cough and shortness of breath.   Cardiovascular: Positive for leg swelling. Negative for chest pain and palpitations.  Gastrointestinal: Negative for abdominal pain, heartburn and nausea.  Genitourinary: Negative for dysuria and urgency.  Musculoskeletal: Positive for joint pain (right knee).  Skin: Negative for itching and rash.  Neurological: Positive for dizziness, focal weakness, weakness and headaches.  Psychiatric/Behavioral: The patient is not nervous/anxious and does not have insomnia.      Past Medical History:  Diagnosis Date  . Anxiety   . Arthritis   . CHF (congestive heart failure) ([place])   . Chronic kidney disease   . Depression   . Diabetes mellitus without complication ([place])   . Hypertension   . Neuropathy   . Occult spina bifida     Past Surgical History:  Procedure Laterality Date  . APPLICATION OF CRANIAL NAVIGATION N/A 04/16/2018   Procedure: APPLICATION OF CRANIAL NAVIGATION;  Surgeon: Judith Part, MD;  Location: [facility];  Service: Neurosurgery;  Laterality: N/A;  . CRANIOTOMY Right 04/16/2018   Procedure: Right craniotomy for tumor resection with brainlab;  Surgeon: Judith Part, MD;  Location: [facility];  Service: Neurosurgery;  Laterality: Right;  right  . ectopic    . INCISION AND DRAINAGE Right 10/04/2016   Procedure: INCISION AND DRAINAGE AND ASPIRATION;  Surgeon: Carole Civil, MD;  Location: [facility];  Service: Orthopedics;  Laterality: Right;  right knee wound  . patella fx Right 07/2015  . TOTAL KNEE ARTHROPLASTY Right 07/20/2016   Procedure: TOTAL KNEE ARTHROPLASTY;  Surgeon: Carole Civil, MD;  Location: [facility];  Service: Orthopedics;  Laterality: Right;    Family History  Problem Relation Age of Onset  . Cancer Mother        colon  . Cancer - Colon Father     Social History:  Married. Independent but has had balance issues with difficulty walking for the past 3 years. Husband assists with home management. She reports that she has never smoked. She has never used smokeless tobacco. She reports that she does not drink alcohol or use drugs.    Allergies  Allergen Reactions  . Corticosteroids Other (See Comments)    Hallucinations/swelling in legs & feet/passed out (ORAL ROUTE ONLY PER PATIENT)   . Gluten Meal Other (See Comments)    Over time increases inflammation in body  . Oxycodone Other (See Comments)    TOO STRONG, RESPIRATORY DEPRESSION    Facility-Administered Medications  Prior to Admission  Medication Dose Route Frequency Provider Last Rate Last Dose  . doxycycline (VIBRA-TABS) tablet 100 mg  100 mg Oral Q12H Carole Civil, MD       Medications Prior to Admission  Medication Sig Dispense Refill  . Alpha-Lipoic Acid 200 MG TABS Take 400 mg by mouth daily.     Marland Kitchen amLODipine (NORVASC) 5 MG tablet Take 5 mg by mouth daily.    . Cholecalciferol (VITAMIN D) 50 MCG (2000  UT) CAPS Take 4,000 Units by mouth daily.    . Coenzyme Q10 (COQ10) 150 MG CAPS Take 150 mg by mouth daily.    . Glucosamine-Chondroit-Vit C-Mn (GLUCOSAMINE 1500 COMPLEX PO) Take 1 tablet by mouth daily.    . Homeopathic Products (ARNICA [place] PO) Take 1 tablet by mouth 2 (two) times daily as needed (pain).    Marland Kitchen olmesartan (BENICAR) 40 MG tablet Take 40 mg by mouth daily.  0  . vitamin B-12 (CYANOCOBALAMIN) 500 MCG tablet Take 500 mcg by mouth daily.    . Zinc 30 MG TABS Take 30 mg by mouth daily.    Marland Kitchen aspirin EC 325 MG EC tablet Take 1 tablet (325 mg total) by mouth daily with breakfast. (Patient not taking: Reported on 06/06/2017) 30 tablet 0  . doxycycline (VIBRAMYCIN) 100 MG capsule Take 1 capsule (100 mg total) by mouth 2 (two) times daily. (Patient not taking: Reported on 04/01/2018) 56 capsule 1  . Homeopathic Products (TRAUMEEL EX) Apply 1 application topically 2 (two) times daily as needed (pain).    . insulin detemir (LEVEMIR) 100 UNIT/ML injection Inject 0.1 mLs (10 Units total) into the skin at bedtime. (Patient not taking: Reported on 03/09/2017) 10 mL 11  . losartan (COZAAR) 100 MG tablet Take 1 tablet (100 mg total) by mouth daily. (Patient not taking: Reported on 10/03/2017) 30 tablet 0  . potassium chloride SA (K-DUR,KLOR-CON) 10 MEQ tablet Take 1 tablet (10 mEq total) by mouth daily. (Patient not taking: Reported on 03/09/2017) 30 tablet 0  . torsemide (DEMADEX) 20 MG tablet Take 2 tablets (40 mg total) by mouth daily. (Patient taking differently: Take 40 mg by mouth daily as needed (edema). ) 30 tablet 0    Drug Regimen Review  Drug regimen was reviewed and remains appropriate with no significant issues identified  Home: Home Living Family/patient expects to be discharged to:: Private residence Living Arrangements: Spouse/significant other Available Help at Discharge: Family, Available 24 hours/day Type of Home: House Home Access: Stairs to enter [employer] of  Steps: 2 Entrance Stairs-Rails: None Home Layout: Two level, Able to live on main level with bedroom/bathroom Bathroom Shower/Tub: Multimedia programmer: Standard Bathroom Accessibility: Yes Home Equipment: Kasandra Knudsen - single point, Environmental consultant - 2 wheels, Shower seat  Lives With: Spouse   Functional History: Prior Function Level of Independence: Independent with assistive device(s) Comments: ADLs and IADLs. Uses a cane for functional mobility. Retired and can drive but hasnt in three years.  Functional Status:  Mobility: Bed Mobility Overal bed mobility: Needs Assistance Bed Mobility: Rolling, Sidelying to Sit, Sit to Sidelying Rolling: Min assist, +2 for physical assistance(especially to the left) Sidelying to sit: Min assist, +2 for physical assistance Sit to sidelying: Mod assist, +2 for physical assistance General bed mobility comments: Min assist to roll bil, easier to roll to the right (pt also prefers to sleep/stay on the right side as she is more dizzy to the left), min assist to support trunk and separately legs to  get to sitting EOB.  Second person there, but not really needed.  Mod assist to help lift both legs back into the bed.  Pt relying heavily on bed rail for support during transitions.  Transfers Overall transfer level: Needs assistance Equipment used: Rolling walker (2 wheeled) Transfers: Sit to/from Stand Sit to Stand: Min assist, +2 physical assistance General transfer comment: Heavy two person min assist to stand from bed with RW.  Assist needed the most once fully upright for stability and assurance that she was ok, not going to fall over.  Pt was too nauseated to take any pivotal steps to the chair.  Attempted one eye occlusion in standing to help with double vision.  Glasses were donned for mobility.       ADL: ADL Overall ADL's : Needs assistance/impaired Eating/Feeding: Set up, Supervision/ safety, Sitting Eating/Feeding Details (indicate cue type and  reason): Supported sitting Grooming: Dance movement psychotherapist, Min guard, Sitting Grooming Details (indicate cue type and reason): Min Guard A for safety in sitting to wash her face Upper Body Bathing: Minimal assistance, Sitting Lower Body Bathing: Maximal assistance, Sit to/from stand Upper Body Dressing : Minimal assistance, Sitting Lower Body Dressing: Maximal assistance, Sit to/from stand Toileting- Clothing Manipulation and Hygiene: Total assistance, Bed level Toileting - Clothing Manipulation Details (indicate cue type and reason): Pt rolling and required Total A for toilet hygiene after bowel incontience in bed Functional mobility during ADLs: Minimal assistance, +2 for physical assistance(sit<>stand) General ADL Comments: Pt presenting with decreased strength and balance. Pt limited by nausea, dizziness, and nystagmus  Cognition: Cognition Overall Cognitive Status: Within Functional Limits for tasks assessed Orientation Level: Oriented to person, Oriented to time, Oriented to situation, Disoriented to place Cognition Arousal/Alertness: Awake/alert Behavior During Therapy: Flat affect Overall Cognitive Status: Within Functional Limits for tasks assessed General Comments: Pt difficult to understand at times, but general conversation is normal, recognizes her husband.    Blood pressure (!) 185/83, pulse 67, temperature 98.6 F (37 C), temperature source Oral, resp. rate 16, height 5\' 6"  (1.676 m), weight 104.8 kg, SpO2 100 %. Physical Exam  Nursing note and vitals reviewed. Constitutional: She is oriented to person, place, and time. She appears well-developed and well-nourished. She is sleeping.  Fatigued appearing.   HENT:  Right crani incision   Eyes: Right eye exhibits no discharge. Left eye exhibits no discharge. No scleral icterus.  Neck: No JVD present.  Cardiovascular: Normal rate. Exam reveals no friction rub.  No murmur heard. Respiratory: Effort normal. No stridor. No  respiratory distress. She has no wheezes.  GI: Soft. She exhibits no distension. There is no abdominal tenderness. There is no rebound.  Musculoskeletal:        General: No edema.  Neurological: She is oriented to person, place, and time.  Right facial weakness with dysarthria. Unable to close right eye lid. 3-4 beats of nystagmus with rightward gaze. Right limb ataxia. Strength 3-4/5 prox to distal.  Able to answer orientation questions and follow simple motor commands.   Skin: Skin is warm.  Crani incision CDI  Psychiatric: She has a normal mood and affect. Her behavior is normal.    Results for orders placed or performed during the hospital encounter of 04/16/18 (from the past 48 hour(s))  CBC     Status: Abnormal   Collection Time: 04/16/18  5:15 PM  Result Value Ref Range   WBC 10.5 4.0 - 10.5 K/uL   RBC 3.53 (L) 3.87 - 5.11 MIL/uL   Hemoglobin  10.5 (L) 12.0 - 15.0 g/dL   HCT 32.6 (L) 36.0 - 46.0 %   MCV 92.4 80.0 - 100.0 fL   MCH 29.7 26.0 - 34.0 pg   MCHC 32.2 30.0 - 36.0 g/dL   RDW 11.2 (L) 11.5 - 15.5 %   Platelets 160 150 - 400 K/uL   nRBC 0.0 0.0 - 0.2 %    Comment: Performed at [facility] [address], [place], [place] [address]  Creatinine, serum     Status: Abnormal   Collection Time: 04/16/18  5:15 PM  Result Value Ref Range   Creatinine, Ser 2.23 (H) 0.44 - 1.00 mg/dL   GFR calc non Af Amer 22 (L) >60 mL/min   GFR calc Af Amer 25 (L) >60 mL/min    Comment: Performed at [facility] [address], [place], [place] [address]  Renal function panel     Status: Abnormal   Collection Time: 04/17/18  8:14 AM  Result Value Ref Range   Sodium 141 135 - 145 mmol/L   Potassium 4.2 3.5 - 5.1 mmol/L   Chloride 108 98 - 111 mmol/L   CO2 24 22 - 32 mmol/L   Glucose, Bld 252 (H) 70 - 99 mg/dL   BUN 32 (H) 8 - 23 mg/dL   Creatinine, Ser 2.54 (H) 0.44 - 1.00 mg/dL   Calcium 9.2 8.9 - 10.3 mg/dL   Phosphorus 4.3 2.5 - 4.6 mg/dL   Albumin 2.6 (L) 3.5 - 5.0  g/dL   GFR calc non Af Amer 19 (L) >60 mL/min   GFR calc Af Amer 22 (L) >60 mL/min   Anion gap 9 5 - 15    Comment: Performed at [facility] [address] [place],  [address]  Glucose, capillary     Status: Abnormal   Collection Time: 04/17/18  5:33 PM  Result Value Ref Range   Glucose-Capillary 239 (H) 70 - 99 mg/dL   Mr Brain Wo Contrast  Result Date: 04/17/2018 CLINICAL DATA:  Follow-up examination status post neoplasm resection. EXAM: MRI HEAD WITHOUT CONTRAST TECHNIQUE: Multiplanar, multiecho pulse sequences of the brain and surrounding structures were obtained without intravenous contrast. COMPARISON:  Prior CT from 04/15/2018 as well as previous brain MRI from 03/22/2018. 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 Small amount of postoperative fluid and/or blood products present within the perimesencephalic and cerebellopontine angle cisterns. Superimposed scattered foci of pneumocephalus seen within this region as well as a few additional scattered foci elsewhere within the brain. Previously identified right CP angle vestibular schwannoma has largely been resected. Small approximately 1 cm focus of somewhat nodular T2/FLAIR signal abnormality at the deep aspect of the right CP angle adjacent to the right medulla could reflect a small amount of residual tumor, not entirely certain given lack of IV contrast (series 8, image 7). There is likely a small amount of residual tumor within the right IAC itself, which is relatively stable in appearance as compared to preoperative exam. Persistent mass effect on the adjacent right brainstem and brachium pontis which remain mildly effaced and compressed towards the left, similar to slightly improved relative to previous exam. Fourth ventricle mildly distorted but remains patent. Associated T2/FLAIR signal abnormality with  restricted diffusion within the adjacent right brachium pontis and lateral right cerebellar hemisphere, consistent with small infarct, predominantly right AICA territory. No frank hemorrhagic transformation. No other complication identified. Appearance  of the brain otherwise stable with underlying advanced age-related cerebral atrophy with extensive chronic microvascular ischemic disease. Multiple superimposed remote lacunar infarcts noted within the bilateral basal ganglia and left thalamus. Multiple chronic micro hemorrhages again seen scattered throughout the supratentorial and infratentorial brain, most likely related to poorly controlled hypertension. No other mass lesion. No significant midline shift. No hydrocephalus or ventricular trapping. No unexpected extra-axial collection. Vascular: Major intracranial vascular flow voids maintained. Skull and upper cervical spine: Craniocervical junction within normal limits. Sequelae of interval right retrosigmoid craniotomy. Sinuses/Orbits: Globes and orbital soft tissues within normal limits. Scattered mucosal thickening throughout the paranasal sinuses with trace layering fluid within the left sphenoid sinus. Well-circumscribed 16 mm T2 hypointense lesion at the right ethmoidal air cells again noted, stable. Postoperative changes noted within the right mastoid air cells. Other: None. IMPRESSION: 1. Postoperative changes from interval right retrosigmoid craniotomy for resection of right cerebellar pontine angle vestibular schwannoma. Normal expected small amount of postoperative fluid and/or blood products within the perimesencephalic and right CP angle cisterns. Persistent mass effect on the adjacent pons and right brachium pontis, stable to slightly improved relative to previous exam. Question small focus of residual tumor at the deep aspect of the right CP angle cistern adjacent to the right medulla as above. Attention on follow-up recommended. 2. Patchy small  volume ischemic infarcts within the right brachium pontis and peripheral right cerebellar hemisphere, predominantly right AICA territory. 3. Otherwise relatively stable appearance of the brain. No other acute intracranial abnormality identified. Electronically Signed   By: Jeannine Boga M.D.   On: 04/17/2018 00:45     Medical Problem List and Plan: 1.  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  2.  DVT Prophylaxis/Anticoagulation: Pharmaceutical: Lovenox 3. Chronic back pain/Pain Management: Tylenol prn 4. Mood: LCSW to follow for evaluation and support.  5. Neuropsych: This patient is capable of making decisions on her own behalf. 6. Skin/Wound Care: Routine pressure relief measures. Monitor wound for healing.  7. Fluids/Electrolytes/Nutrition: Monitor I/O. Check lytes in am.  8. T2DM: Hgb A1c- 6.5. Monitor BS ac/hs. Use SSI for elevated BS--is on very limited diet.  9. Acute on chronic renal failure: Baseline SCr- 2.4---> 2.54.   -encourage PO   -serial labs 10. Occult spina bifida with neuropathy: Managed with OTC supplements.  11.  Leucocytosis: Monitor for signs of infection.  12. Acute blood loss anemia: recheck CBC in am for stability 13. Vestibular dysfunction: N/V with dizziness improving.  -treat supportively    14. HTN: BP still labile---Monitor BP bid. Continue Avapro and Norvasc.  15. Hypoxia: Encourage IS. Wean oxygen to off.  16. Right CN VII palsy:  -eye patch or taping to cover eye  -eye lubricants  Post Admission Physician Evaluation: 1. Functional deficits secondary  to right-sided vestibular schwannoma s/p resection. 2. Patient is admitted to receive collaborative, interdisciplinary care between the physiatrist, rehab nursing staff, and therapy team. 3. Patient's level of medical complexity and substantial therapy needs in context of that medical necessity cannot be provided  at a lesser intensity of care such as a SNF. 4. Patient has experienced substantial functional loss from his/her baseline which was documented above under the "Functional History" and "Functional Status" headings.  Judging by the patient's diagnosis, physical exam, and functional history, the patient has potential for functional progress which will result in measurable gains while on inpatient rehab.  These gains will be of substantial and practical use upon  discharge  in facilitating mobility and self-care at the household level. 5. Physiatrist will provide 24 hour management of medical needs as well as oversight of the therapy plan/treatment and provide guidance as appropriate regarding the interaction of the two. 6. The Preadmission Screening has been reviewed and patient status is unchanged unless otherwise stated above. 7. 24 hour rehab nursing will assist with bladder management, bowel management, safety, skin/wound care, disease management, medication administration, pain management and patient education  and help integrate therapy concepts, techniques,education, etc. 8. PT will assess and treat for/with: Lower extremity strength, range of motion, stamina, balance, functional mobility, safety, adaptive techniques and equipment, NMR, vestibular mgt, family education, pain control.   Goals are: supervision to mod I. 9. OT will assess and treat for/with: ADL's, functional mobility, safety, upper extremity strength, adaptive techniques and equipment, vestibular rx, NMR, ego support, family ed.   Goals are: supervision to mod I. Therapy may proceed with showering this patient. 10. SLP will assess and treat for/with: speech, swallowing.  Goals are: mod I. 11. Case Management and Social Worker will assess and treat for psychological issues and discharge planning. 12. Team conference will be held weekly to assess progress toward goals and to determine barriers to discharge. 13. Patient will receive at least  3 hours of therapy per day at least 5 days per week. 14. ELOS: 13-19 days       15. Prognosis:  excellent   I have personally performed a face to face diagnostic evaluation of this patient and formulated the key components of the plan.  Additionally, I have personally reviewed laboratory data, imaging studies, as well as relevant notes and concur with the physician assistant's documentation above.  [name], MD, [facility]rown    Bary Leriche, PA-C 04/18/2018

## 2018-04-19 ENCOUNTER — Other Ambulatory Visit: Payer: Self-pay

## 2018-04-19 ENCOUNTER — Inpatient Hospital Stay (HOSPITAL_COMMUNITY)
Admission: RE | Admit: 2018-04-19 | Discharge: 2018-05-10 | DRG: 949 | Disposition: A | Discharge status: Home Health | Payer: Medicare Other | Source: Intra-hospital | Attending: Physical Medicine & Rehabilitation | Admitting: Physical Medicine & Rehabilitation

## 2018-04-19 ENCOUNTER — Encounter (HOSPITAL_COMMUNITY): Payer: Self-pay

## 2018-04-19 DIAGNOSIS — Z800 Family history of malignant neoplasm of digestive organs: Secondary | ICD-10-CM

## 2018-04-19 DIAGNOSIS — G519 Disorder of facial nerve, unspecified: Secondary | ICD-10-CM

## 2018-04-19 DIAGNOSIS — Z7982 Long term (current) use of aspirin: Secondary | ICD-10-CM

## 2018-04-19 DIAGNOSIS — Q059 Spina bifida, unspecified: Secondary | ICD-10-CM

## 2018-04-19 DIAGNOSIS — X58XXXA Exposure to other specified factors, initial encounter: Secondary | ICD-10-CM | POA: Diagnosis present

## 2018-04-19 DIAGNOSIS — Z794 Long term (current) use of insulin: Secondary | ICD-10-CM

## 2018-04-19 DIAGNOSIS — E1169 Type 2 diabetes mellitus with other specified complication: Secondary | ICD-10-CM | POA: Diagnosis not present

## 2018-04-19 DIAGNOSIS — E669 Obesity, unspecified: Secondary | ICD-10-CM | POA: Diagnosis present

## 2018-04-19 DIAGNOSIS — T8189XA Other complications of procedures, not elsewhere classified, initial encounter: Secondary | ICD-10-CM

## 2018-04-19 DIAGNOSIS — G8929 Other chronic pain: Secondary | ICD-10-CM | POA: Diagnosis not present

## 2018-04-19 DIAGNOSIS — H532 Diplopia: Secondary | ICD-10-CM | POA: Diagnosis present

## 2018-04-19 DIAGNOSIS — D62 Acute posthemorrhagic anemia: Secondary | ICD-10-CM | POA: Diagnosis not present

## 2018-04-19 DIAGNOSIS — H832X1 Labyrinthine dysfunction, right ear: Secondary | ICD-10-CM

## 2018-04-19 DIAGNOSIS — N179 Acute kidney failure, unspecified: Secondary | ICD-10-CM | POA: Diagnosis present

## 2018-04-19 DIAGNOSIS — R471 Dysarthria and anarthria: Secondary | ICD-10-CM | POA: Diagnosis present

## 2018-04-19 DIAGNOSIS — F329 Major depressive disorder, single episode, unspecified: Secondary | ICD-10-CM | POA: Diagnosis present

## 2018-04-19 DIAGNOSIS — R1310 Dysphagia, unspecified: Secondary | ICD-10-CM | POA: Diagnosis not present

## 2018-04-19 DIAGNOSIS — E8809 Other disorders of plasma-protein metabolism, not elsewhere classified: Secondary | ICD-10-CM | POA: Diagnosis present

## 2018-04-19 DIAGNOSIS — M1990 Unspecified osteoarthritis, unspecified site: Secondary | ICD-10-CM | POA: Diagnosis not present

## 2018-04-19 DIAGNOSIS — I130 Hypertensive heart and chronic kidney disease with heart failure and stage 1 through stage 4 chronic kidney disease, or unspecified chronic kidney disease: Secondary | ICD-10-CM | POA: Diagnosis not present

## 2018-04-19 DIAGNOSIS — Z888 Allergy status to other drugs, medicaments and biological substances status: Secondary | ICD-10-CM

## 2018-04-19 DIAGNOSIS — E876 Hypokalemia: Secondary | ICD-10-CM | POA: Diagnosis not present

## 2018-04-19 DIAGNOSIS — Z483 Aftercare following surgery for neoplasm: Secondary | ICD-10-CM | POA: Diagnosis not present

## 2018-04-19 DIAGNOSIS — I509 Heart failure, unspecified: Secondary | ICD-10-CM | POA: Diagnosis not present

## 2018-04-19 DIAGNOSIS — B9620 Unspecified Escherichia coli [E. coli] as the cause of diseases classified elsewhere: Secondary | ICD-10-CM | POA: Diagnosis present

## 2018-04-19 DIAGNOSIS — Z885 Allergy status to narcotic agent status: Secondary | ICD-10-CM

## 2018-04-19 DIAGNOSIS — R5381 Other malaise: Secondary | ICD-10-CM | POA: Diagnosis not present

## 2018-04-19 DIAGNOSIS — R51 Headache: Secondary | ICD-10-CM | POA: Diagnosis present

## 2018-04-19 DIAGNOSIS — R7309 Other abnormal glucose: Secondary | ICD-10-CM

## 2018-04-19 DIAGNOSIS — E119 Type 2 diabetes mellitus without complications: Secondary | ICD-10-CM | POA: Diagnosis not present

## 2018-04-19 DIAGNOSIS — D333 Benign neoplasm of cranial nerves: Secondary | ICD-10-CM

## 2018-04-19 DIAGNOSIS — G510 Bell's palsy: Secondary | ICD-10-CM | POA: Diagnosis present

## 2018-04-19 DIAGNOSIS — Z79899 Other long term (current) drug therapy: Secondary | ICD-10-CM

## 2018-04-19 DIAGNOSIS — R339 Retention of urine, unspecified: Secondary | ICD-10-CM | POA: Diagnosis not present

## 2018-04-19 DIAGNOSIS — G9389 Other specified disorders of brain: Secondary | ICD-10-CM | POA: Diagnosis not present

## 2018-04-19 DIAGNOSIS — Z9119 Patient's noncompliance with other medical treatment and regimen: Secondary | ICD-10-CM

## 2018-04-19 DIAGNOSIS — R0902 Hypoxemia: Secondary | ICD-10-CM | POA: Diagnosis not present

## 2018-04-19 DIAGNOSIS — N390 Urinary tract infection, site not specified: Secondary | ICD-10-CM | POA: Diagnosis not present

## 2018-04-19 DIAGNOSIS — D3610 Benign neoplasm of peripheral nerves and autonomic nervous system, unspecified: Secondary | ICD-10-CM | POA: Diagnosis not present

## 2018-04-19 DIAGNOSIS — I5030 Unspecified diastolic (congestive) heart failure: Secondary | ICD-10-CM | POA: Diagnosis not present

## 2018-04-19 DIAGNOSIS — N189 Chronic kidney disease, unspecified: Secondary | ICD-10-CM | POA: Diagnosis not present

## 2018-04-19 DIAGNOSIS — R112 Nausea with vomiting, unspecified: Secondary | ICD-10-CM | POA: Diagnosis present

## 2018-04-19 DIAGNOSIS — I10 Essential (primary) hypertension: Secondary | ICD-10-CM

## 2018-04-19 DIAGNOSIS — M549 Dorsalgia, unspecified: Secondary | ICD-10-CM | POA: Diagnosis present

## 2018-04-19 DIAGNOSIS — R270 Ataxia, unspecified: Secondary | ICD-10-CM | POA: Diagnosis not present

## 2018-04-19 DIAGNOSIS — E441 Mild protein-calorie malnutrition: Secondary | ICD-10-CM | POA: Diagnosis not present

## 2018-04-19 DIAGNOSIS — N184 Chronic kidney disease, stage 4 (severe): Secondary | ICD-10-CM | POA: Diagnosis not present

## 2018-04-19 DIAGNOSIS — D72829 Elevated white blood cell count, unspecified: Secondary | ICD-10-CM | POA: Diagnosis present

## 2018-04-19 DIAGNOSIS — Z9114 Patient's other noncompliance with medication regimen: Secondary | ICD-10-CM

## 2018-04-19 DIAGNOSIS — R1312 Dysphagia, oropharyngeal phase: Secondary | ICD-10-CM | POA: Diagnosis not present

## 2018-04-19 DIAGNOSIS — R509 Fever, unspecified: Secondary | ICD-10-CM | POA: Diagnosis not present

## 2018-04-19 DIAGNOSIS — R0989 Other specified symptoms and signs involving the circulatory and respiratory systems: Secondary | ICD-10-CM

## 2018-04-19 DIAGNOSIS — Z9889 Other specified postprocedural states: Secondary | ICD-10-CM | POA: Diagnosis not present

## 2018-04-19 DIAGNOSIS — Z96651 Presence of right artificial knee joint: Secondary | ICD-10-CM | POA: Diagnosis present

## 2018-04-19 DIAGNOSIS — E46 Unspecified protein-calorie malnutrition: Secondary | ICD-10-CM

## 2018-04-19 DIAGNOSIS — T148XXA Other injury of unspecified body region, initial encounter: Secondary | ICD-10-CM

## 2018-04-19 DIAGNOSIS — Z9981 Dependence on supplemental oxygen: Secondary | ICD-10-CM

## 2018-04-19 DIAGNOSIS — K5900 Constipation, unspecified: Secondary | ICD-10-CM | POA: Diagnosis not present

## 2018-04-19 DIAGNOSIS — E1122 Type 2 diabetes mellitus with diabetic chronic kidney disease: Secondary | ICD-10-CM | POA: Diagnosis present

## 2018-04-19 DIAGNOSIS — S00522A Blister (nonthermal) of oral cavity, initial encounter: Secondary | ICD-10-CM | POA: Diagnosis present

## 2018-04-19 DIAGNOSIS — I169 Hypertensive crisis, unspecified: Secondary | ICD-10-CM

## 2018-04-19 DIAGNOSIS — F419 Anxiety disorder, unspecified: Secondary | ICD-10-CM | POA: Diagnosis present

## 2018-04-19 DIAGNOSIS — J449 Chronic obstructive pulmonary disease, unspecified: Secondary | ICD-10-CM | POA: Diagnosis not present

## 2018-04-19 DIAGNOSIS — R1311 Dysphagia, oral phase: Secondary | ICD-10-CM | POA: Diagnosis not present

## 2018-04-19 DIAGNOSIS — Z91018 Allergy to other foods: Secondary | ICD-10-CM

## 2018-04-19 DIAGNOSIS — R011 Cardiac murmur, unspecified: Secondary | ICD-10-CM | POA: Diagnosis present

## 2018-04-19 DIAGNOSIS — I129 Hypertensive chronic kidney disease with stage 1 through stage 4 chronic kidney disease, or unspecified chronic kidney disease: Secondary | ICD-10-CM | POA: Diagnosis not present

## 2018-04-19 DIAGNOSIS — E875 Hyperkalemia: Secondary | ICD-10-CM

## 2018-04-19 DIAGNOSIS — G629 Polyneuropathy, unspecified: Secondary | ICD-10-CM | POA: Diagnosis not present

## 2018-04-19 DIAGNOSIS — N183 Chronic kidney disease, stage 3 (moderate): Secondary | ICD-10-CM | POA: Diagnosis not present

## 2018-04-19 DIAGNOSIS — E8889 Other specified metabolic disorders: Secondary | ICD-10-CM | POA: Diagnosis present

## 2018-04-19 DIAGNOSIS — D631 Anemia in chronic kidney disease: Secondary | ICD-10-CM | POA: Diagnosis not present

## 2018-04-19 DIAGNOSIS — R8290 Unspecified abnormal findings in urine: Secondary | ICD-10-CM

## 2018-04-19 DIAGNOSIS — Z6837 Body mass index (BMI) 37.0-37.9, adult: Secondary | ICD-10-CM

## 2018-04-19 DIAGNOSIS — H9191 Unspecified hearing loss, right ear: Secondary | ICD-10-CM | POA: Diagnosis present

## 2018-04-19 DIAGNOSIS — N2581 Secondary hyperparathyroidism of renal origin: Secondary | ICD-10-CM | POA: Diagnosis not present

## 2018-04-19 MED ORDER — CLONIDINE HCL 0.1 MG PO TABS
0.1000 mg | ORAL_TABLET | Freq: Four times a day (QID) | ORAL | Status: DC | PRN
  Administered 2018-04-19 – 2018-04-24 (×7): 0.1 mg via ORAL
  Filled 2018-04-19 (×7): qty 1

## 2018-04-19 MED ORDER — IRBESARTAN 300 MG PO TABS
300.0000 mg | ORAL_TABLET | Freq: Every day | ORAL | Status: DC
  Administered 2018-04-20 – 2018-04-22 (×3): 300 mg via ORAL
  Filled 2018-04-19 (×3): qty 1

## 2018-04-19 MED ORDER — ACETAMINOPHEN 325 MG PO TABS
325.0000 mg | ORAL_TABLET | ORAL | Status: DC | PRN
  Administered 2018-04-27 – 2018-04-28 (×2): 650 mg via ORAL
  Filled 2018-04-19 (×4): qty 2

## 2018-04-19 MED ORDER — POLYETHYLENE GLYCOL 3350 17 G PO PACK
17.0000 g | PACK | Freq: Every day | ORAL | Status: DC
  Administered 2018-04-21 – 2018-05-08 (×18): 17 g via ORAL
  Filled 2018-04-19 (×20): qty 1

## 2018-04-19 MED ORDER — HYDROCODONE-ACETAMINOPHEN 5-325 MG PO TABS: 1.0000 | ORAL_TABLET | ORAL | Status: DC | PRN

## 2018-04-19 MED ORDER — AMLODIPINE BESYLATE 5 MG PO TABS
5.0000 mg | ORAL_TABLET | ORAL | Status: DC
  Administered 2018-04-19: 5 mg via ORAL
  Filled 2018-04-19: qty 1

## 2018-04-19 MED ORDER — FAMOTIDINE 20 MG PO TABS
20.0000 mg | ORAL_TABLET | Freq: Every day | ORAL | Status: DC
  Administered 2018-04-20 – 2018-04-30 (×11): 20 mg via ORAL
  Filled 2018-04-19 (×11): qty 1

## 2018-04-19 MED ORDER — KATE FARMS STANDARD 1.0 PO LIQD
235.0000 mL | Freq: Two times a day (BID) | ORAL | Status: DC
  Administered 2018-04-19 – 2018-05-09 (×37): 235 mL via ORAL
  Filled 2018-04-19 (×46): qty 235

## 2018-04-19 MED ORDER — ORAL CARE MOUTH RINSE
15.0000 mL | Freq: Two times a day (BID) | OROMUCOSAL | Status: DC
  Administered 2018-04-19 – 2018-05-08 (×34): 15 mL via OROMUCOSAL

## 2018-04-19 MED ORDER — VITAMIN D 25 MCG (1000 UNIT) PO TABS
4000.0000 [IU] | ORAL_TABLET | Freq: Every day | ORAL | Status: DC
  Administered 2018-04-20 – 2018-05-08 (×19): 4000 [IU] via ORAL
  Filled 2018-04-19 (×20): qty 4

## 2018-04-19 MED ORDER — HYDROCODONE-ACETAMINOPHEN 5-325 MG PO TABS: 1.0000 | ORAL_TABLET | ORAL | 0 refills | Status: DC | PRN

## 2018-04-19 MED ORDER — TRAZODONE HCL 50 MG PO TABS
25.0000 mg | ORAL_TABLET | Freq: Every evening | ORAL | Status: DC | PRN
  Administered 2018-04-20: 25 mg via ORAL
  Administered 2018-04-25: 50 mg via ORAL
  Filled 2018-04-19 (×6): qty 1

## 2018-04-19 MED ORDER — FLEET ENEMA 7-19 GM/118ML RE ENEM: 1.0000 | ENEMA | Freq: Once | RECTAL | Status: DC | PRN

## 2018-04-19 MED ORDER — MECLIZINE HCL 25 MG PO TABS: 12.5000 mg | ORAL_TABLET | Freq: Three times a day (TID) | ORAL | Status: DC | PRN

## 2018-04-19 MED ORDER — ONDANSETRON HCL 4 MG PO TABS
4.0000 mg | ORAL_TABLET | ORAL | Status: DC | PRN
  Administered 2018-04-24 – 2018-05-07 (×3): 4 mg via ORAL
  Filled 2018-04-19 (×4): qty 1

## 2018-04-19 MED ORDER — POLYVINYL ALCOHOL 1.4 % OP SOLN
1.0000 [drp] | Freq: Four times a day (QID) | OPHTHALMIC | Status: DC
  Administered 2018-04-19 – 2018-05-09 (×78): 1 [drp] via OPHTHALMIC
  Filled 2018-04-19: qty 15

## 2018-04-19 MED ORDER — DIPHENHYDRAMINE HCL 12.5 MG/5ML PO ELIX: 12.5000 mg | ORAL_SOLUTION | Freq: Four times a day (QID) | ORAL | Status: DC | PRN

## 2018-04-19 MED ORDER — PROCHLORPERAZINE EDISYLATE 10 MG/2ML IJ SOLN: 5.0000 mg | Freq: Four times a day (QID) | INTRAMUSCULAR | Status: DC | PRN

## 2018-04-19 MED ORDER — LIDOCAINE HCL URETHRAL/MUCOSAL 2 % EX GEL
CUTANEOUS | Status: DC | PRN
  Filled 2018-04-19: qty 5

## 2018-04-19 MED ORDER — ZINC SULFATE 220 (50 ZN) MG PO CAPS
220.0000 mg | ORAL_CAPSULE | Freq: Every day | ORAL | Status: DC
  Administered 2018-04-20 – 2018-05-08 (×19): 220 mg via ORAL
  Filled 2018-04-19 (×21): qty 1

## 2018-04-19 MED ORDER — ONDANSETRON HCL 4 MG/2ML IJ SOLN: 4.0000 mg | INTRAMUSCULAR | Status: DC | PRN

## 2018-04-19 MED ORDER — GUAIFENESIN-DM 100-10 MG/5ML PO SYRP: 5.0000 mL | ORAL_SOLUTION | Freq: Four times a day (QID) | ORAL | Status: DC | PRN

## 2018-04-19 MED ORDER — PROCHLORPERAZINE 25 MG RE SUPP: 12.5000 mg | Freq: Four times a day (QID) | RECTAL | Status: DC | PRN

## 2018-04-19 MED ORDER — NAPHAZOLINE-GLYCERIN 0.012-0.2 % OP SOLN
1.0000 [drp] | Freq: Four times a day (QID) | OPHTHALMIC | Status: DC
  Administered 2018-04-19 – 2018-05-09 (×80): 1 [drp] via OPHTHALMIC
  Filled 2018-04-19: qty 15

## 2018-04-19 MED ORDER — INSULIN ASPART 100 UNIT/ML ~~LOC~~ SOLN
0.0000 [IU] | Freq: Every day | SUBCUTANEOUS | Status: DC
  Administered 2018-04-21 – 2018-05-03 (×2): 2 [IU] via SUBCUTANEOUS

## 2018-04-19 MED ORDER — DOCUSATE SODIUM 100 MG PO CAPS
100.0000 mg | ORAL_CAPSULE | Freq: Two times a day (BID) | ORAL | Status: DC
  Administered 2018-04-19 – 2018-05-08 (×39): 100 mg via ORAL
  Filled 2018-04-19 (×41): qty 1

## 2018-04-19 MED ORDER — HEPARIN SODIUM (PORCINE) 5000 UNIT/ML IJ SOLN: 5000.0000 [IU] | Freq: Three times a day (TID) | INTRAMUSCULAR | Status: DC

## 2018-04-19 MED ORDER — DOCUSATE SODIUM 100 MG PO CAPS: 100.0000 mg | ORAL_CAPSULE | Freq: Two times a day (BID) | ORAL | 0 refills | Status: DC

## 2018-04-19 MED ORDER — NON FORMULARY: 325.0000 mL | Freq: Two times a day (BID) | Status: DC

## 2018-04-19 MED ORDER — ALUM & MAG HYDROXIDE-SIMETH 200-200-20 MG/5ML PO SUSP: 30.0000 mL | ORAL | Status: DC | PRN

## 2018-04-19 MED ORDER — ENOXAPARIN SODIUM 40 MG/0.4ML ~~LOC~~ SOLN
40.0000 mg | SUBCUTANEOUS | Status: DC
  Administered 2018-04-19 – 2018-04-22 (×4): 40 mg via SUBCUTANEOUS
  Filled 2018-04-19 (×4): qty 0.4

## 2018-04-19 MED ORDER — BISACODYL 10 MG RE SUPP
10.0000 mg | Freq: Every day | RECTAL | Status: DC | PRN
  Administered 2018-04-22: 10 mg via RECTAL
  Filled 2018-04-19: qty 1

## 2018-04-19 MED ORDER — POLYETHYLENE GLYCOL 3350 17 G PO PACK
17.0000 g | PACK | Freq: Every day | ORAL | Status: DC | PRN
  Filled 2018-04-19: qty 1

## 2018-04-19 MED ORDER — FAMOTIDINE 20 MG PO TABS
20.0000 mg | ORAL_TABLET | Freq: Every day | ORAL | Status: DC
  Filled 2018-04-19: qty 1

## 2018-04-19 MED ORDER — PROCHLORPERAZINE MALEATE 5 MG PO TABS
5.0000 mg | ORAL_TABLET | Freq: Four times a day (QID) | ORAL | Status: DC | PRN
  Administered 2018-04-30: 10 mg via ORAL
  Filled 2018-04-19: qty 2

## 2018-04-19 MED ORDER — VITAMIN B-12 1000 MCG PO TABS
500.0000 ug | ORAL_TABLET | Freq: Every day | ORAL | Status: DC
  Administered 2018-04-20 – 2018-05-08 (×19): 500 ug via ORAL
  Filled 2018-04-19 (×20): qty 1

## 2018-04-19 MED ORDER — INSULIN ASPART 100 UNIT/ML ~~LOC~~ SOLN
0.0000 [IU] | Freq: Three times a day (TID) | SUBCUTANEOUS | Status: DC
  Administered 2018-04-19: 3 [IU] via SUBCUTANEOUS
  Administered 2018-04-20 (×2): 2 [IU] via SUBCUTANEOUS
  Administered 2018-04-20: 3 [IU] via SUBCUTANEOUS
  Administered 2018-04-21 – 2018-04-22 (×4): 2 [IU] via SUBCUTANEOUS
  Administered 2018-04-22 (×2): 3 [IU] via SUBCUTANEOUS
  Administered 2018-04-23 (×3): 2 [IU] via SUBCUTANEOUS
  Administered 2018-04-24: 3 [IU] via SUBCUTANEOUS
  Administered 2018-04-24 – 2018-04-25 (×5): 2 [IU] via SUBCUTANEOUS
  Administered 2018-04-26: 1 [IU] via SUBCUTANEOUS
  Administered 2018-04-26 – 2018-04-28 (×3): 2 [IU] via SUBCUTANEOUS
  Administered 2018-04-28: 1 [IU] via SUBCUTANEOUS
  Administered 2018-04-29 (×2): 2 [IU] via SUBCUTANEOUS
  Administered 2018-04-30: 5 [IU] via SUBCUTANEOUS
  Administered 2018-04-30 – 2018-05-03 (×6): 2 [IU] via SUBCUTANEOUS
  Administered 2018-05-03 – 2018-05-04 (×2): 3 [IU] via SUBCUTANEOUS
  Administered 2018-05-04: 2 [IU] via SUBCUTANEOUS
  Administered 2018-05-05 (×2): 1 [IU] via SUBCUTANEOUS
  Administered 2018-05-06: 2 [IU] via SUBCUTANEOUS
  Administered 2018-05-06 (×2): 1 [IU] via SUBCUTANEOUS
  Administered 2018-05-07 – 2018-05-08 (×3): 2 [IU] via SUBCUTANEOUS
  Administered 2018-05-08 – 2018-05-09 (×2): 3 [IU] via SUBCUTANEOUS
  Administered 2018-05-09: 2 [IU] via SUBCUTANEOUS

## 2018-04-19 MED ORDER — TAMSULOSIN HCL 0.4 MG PO CAPS
0.4000 mg | ORAL_CAPSULE | Freq: Every day | ORAL | Status: AC
  Administered 2018-04-20: 0.4 mg via ORAL
  Filled 2018-04-19: qty 1

## 2018-04-19 NOTE — Discharge Summary (Signed)
Discharge Summary  Date of Admission: 04/16/2018  Date of Discharge: 04/19/18  Attending Physician: Emelda Brothers, MD  Hospital Course: Patient was admitted following an uncomplicated right retrosigmoid resection of a vestibular schwannoma. She was recovered in PACU and transferred to the neuro ICU for overnight monitoring. Post-operatively, as expected, she had a House-Brackmann grade 3 facial palsy with some ipsilateral facial numbness. Immediately post-op, this was a HB 4 but it improved to a HB3 within a few hours. Her hospital course was uncomplicated and the patient was discharged to rehab on 04/19/2018. She will follow up in clinic with me in 2 weeks.  Neurologic exam at discharge:  AOx3, PERRL, EOMI, +House Brackmann Gr 3 facial palsy, eye closure with effort, +mild R V2 distribution numbness, TM Strength 5/5 x4, SILTx4  Judith Part, MD 04/19/18 2:02 PM

## 2018-04-19 NOTE — Progress Notes (Signed)
Nurse has spoken with family at bedside who are concerned about patient diet in hospital. Patient with extensive medical history and renal issues. Family is stating that they "were not anticipating a dysphagia diet".   Nurse spoke with Dr. Zada Finders regarding.   Nurse spoke with dietician who suggested a nutrition ambassador to come and explain what is offered and what is not.   Family states they have brought food and asked about a microwave to warm food up (mainly homemade soup). Patient is on a thin liquid order and takes tea at bedside (made by family).

## 2018-04-19 NOTE — Progress Notes (Signed)
Occupational Therapy Treatment Patient Details Name: Natalie Friedman MRN: [id number] DOB: May 11, 1950 Today's Date: 04/19/2018    History of present illness 68 y.o. female admitted on 04/16/18 for resection of vestibular schwannoma.  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   OT comments  This 68 yo female seen today in conjunction with PT due to working on standing and transfers with nausea, dizziness, and intermittent double vision (better today with taping of right lense of glasses). Pt able to get out of bed today and to recliner with increased time for pt to focus on target to help with dizziness. She will continue to benefit from acute OT with follow up OT on CIR to get back to a level she can go home with husband.   Follow Up Recommendations  CIR;Supervision/Assistance - 24 hour    Equipment Recommendations  Other (comment)(TBD next venue)       Precautions / Restrictions Precautions Precautions: Fall Precaution Comments: dizziness and nausea, h/o falls, double vision Required Braces or Orthoses: Other Brace Other Brace: glasses with opaque occlusion on right lens of glasses Restrictions Weight Bearing Restrictions: No       Mobility Bed Mobility Overal bed mobility: Needs Assistance Bed Mobility: Rolling;Sidelying to Sit Rolling: Min assist Sidelying to sit: Mod assist       General bed mobility comments: Min assist to support pt in progressing her legs EOB to get to side lying and to help her find the bed rail with her right hand.  Mod assist at trunk and legs to come to sitting EOB as pt with imbalance during transitions, reaching for end of bed for support and right preference leaning.  Cues to find target on OT's face to focus on to help the dizziness.   Transfers Overall transfer level: Needs assistance Equipment used: 2 person hand held assist Transfers: Sit to/from [employer] Sit to Stand: +2 physical assistance;Mod assist Stand pivot transfers: +2 physical assistance;Mod assist       General transfer comment: Two person mod assist to stand and pivot to the recliner chair on pt's left, she continues in standing to have a right lateral lean and has more difficulty moving right leg around during pivotal steps to the chair.      Balance Overall balance assessment: Needs assistance Sitting-balance support: Feet supported;Bilateral upper extremity supported Sitting balance-Leahy Scale: Poor Sitting balance - Comments: Pt is reliant on bil UE support EOB. Therapist sat on her right as she was tipping (not pushing ) to the right.  Worked on reaching to the left and attempting more midline sitting balance, also worked on targeting in sitting to help with dizziness/nausea.  Blue emesis bag kept very close.  Postural control: Right lateral lean Standing balance support: Bilateral upper extremity supported Standing balance-Leahy Scale: Poor Standing balance comment: two person mod hand held assist with R lateral lean in standing.                            ADL either performed or assessed with clinical judgement   ADL Overall ADL's : Needs assistance/impaired     Grooming: Wash/dry face;Supervision/safety;Set up Grooming Details (indicate cue type and reason): VCs for throughness                 Toilet Transfer: Moderate assistance;+2 for physical assistance;Stand-pivot Toilet Transfer Details (indicate cue type  and reason): bed>recliner going to pt's left Toileting- Clothing Manipulation and Hygiene: Total assistance Toileting - Clothing Manipulation Details (indicate cue type and reason): mod A +2 to maintain standing             Vision Baseline Vision/History: Wears glasses Wears Glasses: At all times Patient Visual Report: Diplopia Vision Assessment?: Vision impaired- to be further tested in functional context Additional  Comments: better movement of eyes together today, but pt still having double vision when both eyes open. Taped right nasal portion of right eye and pt then only reported seeing single of everything. Pt still having issues with getting right eye lid completely shut once she has opened it. Eye was taped with hyperfix tape, RN made aware by PT to use paper tape--hyperfix tape is too sticky for over the eye and leaves too much sticky residue.          Cognition Arousal/Alertness: Awake/alert Behavior During Therapy: Flat affect Overall Cognitive Status: Impaired/Different from baseline Area of Impairment: Attention;Following commands;Safety/judgement;Awareness;Problem solving                   Current Attention Level: Sustained   Following Commands: Follows one step commands inconsistently;Follows one step commands with increased time Safety/Judgement: Decreased awareness of safety;Decreased awareness of deficits Awareness: Intellectual Problem Solving: Slow processing;Decreased initiation;Difficulty sequencing;Requires verbal cues;Requires tactile cues General Comments: Not sure if pt is not HOH as well making communication difficult (needs repetition).  He expressive speech difficulty doesn't help our ability to comprehend her as well.                    Pertinent Vitals/ Pain       Pain Assessment: Faces Faces Pain Scale: Hurts even more Pain Location: "everywhere" Pain Descriptors / Indicators: Aching;Sore Pain Intervention(s): Limited activity within patient's tolerance;Monitored during session;Repositioned  Home Living Family/patient expects to be discharged to:: Inpatient rehab Living Arrangements: Spouse/significant other                                          Frequency  Min 3X/week        Progress Toward Goals  OT Goals(current goals can now be found in the care plan section)  Progress towards OT goals: Progressing toward goals  Acute  Rehab OT Goals Patient Stated Goal: to get well enought to walk in her garden  Plan Discharge plan remains appropriate    Co-evaluation    PT/OT/SLP Co-Evaluation/Treatment: Yes Reason for Co-Treatment: Complexity of the patient's impairments (multi-system involvement);Necessary to address cognition/behavior during functional activity;For patient/therapist safety;To address functional/ADL transfers PT goals addressed during session: Mobility/safety with mobility;Balance OT goals addressed during session: ADL's and self-care      AM-PAC OT "6 Clicks" Daily Activity     Outcome Measure   Help from another person eating meals?: A Little Help from another person taking care of personal grooming?: A Little Help from another person toileting, which includes using toliet, bedpan, or urinal?: A Lot Help from another person bathing (including washing, rinsing, drying)?: A Lot Help from another person to put on and taking off regular upper body clothing?: A Lot Help from another person to put on and taking off regular lower body clothing?: Total 6 Click Score: 13    End of Session Equipment Utilized During Treatment: Gait belt  OT Visit Diagnosis: Other abnormalities of gait and mobility (R26.89);Unsteadiness  on feet (R26.81);Muscle weakness (generalized) (M62.81);History of falling (Z91.81);Pain Pain - part of body: (all over)   Activity Tolerance Patient tolerated treatment well(intermittent feelings of nausea with a couple of dry heaves)   Patient Left in chair;with call bell/phone within reach;with family/visitor present   Nurse Communication Mobility status;Need for lift equipment(use sara stedy)        Time: 1024-1100 OT Time Calculation (min): 36 min  Charges: OT General Charges $OT Visit: 1 Visit OT Treatments $Self Care/Home Management : 8-22 mins [name], OTR/L Acute [employer] Pager [phone] Office [phone]      Almon Register 04/19/2018, 1:38 PM

## 2018-04-19 NOTE — Discharge Instructions (Signed)
Discharge Instructions  No restriction in activities, slowly increase your activity back to normal.   Your incision is closed with absorbable sutures. These will naturally fall off over the next 4-6 weeks. If they become bothersome or cause discomfort, apply some antibiotic ointment like bacitracin or neosporin on the sutures. This will soften them up and usually makes them more comfortable while they dissolve.  Okay to shower on the day of discharge. Be gentle when cleaning your incision. Use regular soap and water. If that is uncomfortable, try using baby shampoo. Do not submerge the wound under water for 2 weeks after surgery.  Follow up with Dr. Paiton Fosco in 2 weeks after discharge. If you do not already have a discharge appointment, please call his office at [phone] to schedule a follow up appointment. If you have any concerns or questions, please call the office and let us know. 

## 2018-04-19 NOTE — Progress Notes (Signed)
Physical Therapy Treatment Patient Details Name: Natalie Friedman MRN: [id number] DOB: [birth year] Today's Date: 04/19/2018    History of Present Illness 68 y.o. female admitted on 04/16/18 for resection of vestibular schwannoma.  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    PT Comments    Co-session with OT, pt continues to endorse double vision, OT partially occluded glasses with success in helping douzble vision.  She continues to have diziness with mobility, so we reinforced targeting as a compensatory strategy.  I hung a couple of target "A"s in her room to assist with ease of finding a target during mobility.  She was able to get up OOB to the chair today with two person assist.  Right arm and leg seem to be lagging in strength and coordination.  PT will continue to follow acutely for safe mobility progression  Follow Up Recommendations  CIR     Equipment Recommendations  Wheelchair (measurements PT);Wheelchair cushion (measurements PT)    Recommendations for Other Services   NA     Precautions / Restrictions Precautions Precautions: Fall;Other (comment) Precaution Comments: dizziness and nausea, h/o falls, double vision Required Braces or Orthoses: Other Brace Other Brace: glasses with opaque occlusion    Mobility  Bed Mobility Overal bed mobility: Needs Assistance Bed Mobility: Rolling;Sidelying to Sit Rolling: Min assist Sidelying to sit: Mod assist       General bed mobility comments: Min assist to support pt in progressing her legs EOB to get to side lying and to help her find the bed rail with her right hand.  Mod assist at trunk and legs to come to sitting EOB as pt with imbalance during transitions, reaching for end of bed for support and right preference leaning.  Cues to find target on OT's face to focus on to help the dizziness.   Transfers Overall transfer level: Needs  assistance Equipment used: 2 person hand held assist Transfers: Sit to/from [employer] Sit to Stand: +2 physical assistance;Mod assist Stand pivot transfers: +2 physical assistance;Mod assist       General transfer comment: Two person mod assist to stand and pivot to the recliner chair on pt's left, she continues in standing to have a right lateral lean and has more difficulty moving right leg around during pivotal steps to the chair.    Ambulation/Gait             General Gait Details: unable to yet, but progressing that way.  Today is her first day out of bed.            Balance Overall balance assessment: Needs assistance Sitting-balance support: Feet supported;Bilateral upper extremity supported Sitting balance-Leahy Scale: Poor Sitting balance - Comments: Pt is reliant on bil UE support EOB. Therapist sat on her right as she was tipping (not pushing ) to the right.  Worked on reaching to the left and attempting more midline sitting balance, also worked on targeting in sitting to help with dizziness/nausea.  Blue emesis bag kept very close.  Postural control: Right lateral lean Standing balance support: Bilateral upper extremity supported Standing balance-Leahy Scale: Poor Standing balance comment: two person mod hand held assist with R lateral lean in standing.                             Cognition Arousal/Alertness: Awake/alert Behavior During Therapy: Flat affect Overall  Cognitive Status: Impaired/Different from baseline Area of Impairment: Attention;Following commands;Safety/judgement;Awareness;Problem solving                   Current Attention Level: Sustained   Following Commands: Follows one step commands inconsistently;Follows one step commands with increased time Safety/Judgement: Decreased awareness of safety;Decreased awareness of deficits Awareness: Intellectual Problem Solving: Slow processing;Decreased  initiation;Difficulty sequencing;Requires verbal cues;Requires tactile cues General Comments: Not sure if pt is not HOH as well making communication difficult (needs repetition).  He expressive speech difficulty doesn't help our ability to comprehend her as well.              Pertinent Vitals/Pain Pain Assessment: Faces Faces Pain Scale: Hurts even more Pain Location: "everywhere" Pain Descriptors / Indicators: Aching;Sore Pain Intervention(s): Limited activity within patient's tolerance;Monitored during session;Repositioned           PT Goals (current goals can now be found in the care plan section) Acute Rehab PT Goals Patient Stated Goal: to get well enought to walk in her garden Progress towards PT goals: Progressing toward goals    Frequency    Min 3X/week      PT Plan Current plan remains appropriate    Co-evaluation PT/OT/SLP Co-Evaluation/Treatment: Yes Reason for Co-Treatment: Complexity of the patient's impairments (multi-system involvement);Necessary to address cognition/behavior during functional activity;For patient/therapist safety;To address functional/ADL transfers PT goals addressed during session: Mobility/safety with mobility;Balance        AM-PAC PT "6 Clicks" Mobility   Outcome Measure  Help needed turning from your back to your side while in a flat bed without using bedrails?: A Little Help needed moving from lying on your back to sitting on the side of a flat bed without using bedrails?: A Lot Help needed moving to and from a bed to a chair (including a wheelchair)?: A Lot Help needed standing up from a chair using your arms (e.g., wheelchair or bedside chair)?: A Lot Help needed to walk in hospital room?: A Lot Help needed climbing 3-5 steps with a railing? : Total 6 Click Score: 12    End of Session Equipment Utilized During Treatment: Gait belt Activity Tolerance: Other (comment);Patient limited by pain(limited by nausea and  dizziness) Patient left: in chair;with call bell/phone within reach;with chair alarm set;with family/visitor present Nurse Communication: Mobility status;Need for lift equipment(use steady for back to bed) PT Visit Diagnosis: Dizziness and giddiness (R42);Other symptoms and signs involving the nervous system (R29.898);Difficulty in walking, not elsewhere classified (R26.2);Pain Pain - Right/Left: Right Pain - part of body: (head, back)     Time: 1024-1100 PT Time Calculation (min) (ACUTE ONLY): 36 min  Charges:  $Neuromuscular Re-education: 8-22 mins            Mckenzy Salazar B. Rajvir Ernster, PT, DPT  Acute Rehabilitation [phone] pager #[phone] office            04/19/2018, 11:15 AM

## 2018-04-19 NOTE — Progress Notes (Signed)
Natalie Gong, RN  Rehab Admission Coordinator  Physical Medicine and Rehabilitation  PMR Pre-admission  Signed  Date of Service:  04/18/2018 4:55 PM       Related encounter: Admission (Discharged) from 04/16/2018 in [facility]         Show:Clear all [x] Manual[x] Template[x] Copied  Added by: [x] Natalie Gong, RN  [] Hover for details PMR Admission Coordinator Pre-Admission Assessment  Patient: Natalie Friedman is an 68 y.o., female MRN: [id number] DOB: Mar 03, 1951 Height: 5\' 6"  (167.6 cm) Weight: 104.8 kg                                                                                                                                                  Insurance Information HMO: yes    PPO:      PCP:      IPA:      80/20:      OTHER: medicare advantage plan PRIMARY: [employer] Medicare      Policy#: [id number]      Subscriber: pt CM Name: Natalie Friedman      Phone#: [phone]     Fax#: [phone] Pre-Cert#: [id number] approved for 7 days with f/u Dorthula Nettles     Employer: retired Benefits:  Phone #: [phone]     Name: 04/18/2018 Eff. Date: 04/10/2018     Deduct: none      Out of Pocket Max: [phone]      Life Max: none CIR: $225 co pay per admission for has Medicaid      SNF: no co pay days 1 until 20; $176 co pay y per day days 21 until 100 Outpatient: no co pay per visit for had medicaid     Co-Pay: visits per medical neccesity Home Health: 100%      Co-Pay: visits per medical neccesity DME: 80%     Co-Pay: 20% Providers: in network  SECONDARY: Medicaid of [place]      Policy#: [id number]      Subscriber: pt *  Medicaid Application Date:       Case Manager:  Disability Application Date:       Case Worker:   Emergency Contact Information         Contact Information    Name Relation Home Work Mobile   [place] Spouse [phone]  [phone]   Larie, Mathes [phone]       Current Medical History  Patient  Admitting Diagnosis: 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 She was found to have right CP mass felt to be right vestibular schwannoma and was admitted on 04/16/2018 for right retrosigmoid craniotomy for resection of mass by Dr. Zada Finders.  Post with right facial palsy and dysarthria.  Therapy evaluations revealing diplopia, N/V and nystagmus affecting gait and ADL tasks.    Past Medical History      Past Medical History:  Diagnosis Date  . Anxiety   . Arthritis   . CHF (congestive heart failure) ([place])   . Chronic kidney disease   . Depression   . Diabetes mellitus without complication ([place])   . Hypertension   . Neuropathy   . Occult spina bifida     Family History  family history includes Cancer in her mother; Cancer - Colon in her father.  Prior Rehab/Hospitalizations:  Has the patient had major surgery during 100 days prior to admission? No  Current Medications   Current Facility-Administered Medications:  .  acetaminophen (TYLENOL) tablet 650 mg, 650 mg, Oral, Q4H PRN **OR** acetaminophen (TYLENOL) suppository 650 mg, 650 mg, Rectal, Q4H PRN, Ostergard, Thomas A, MD .  amLODipine (NORVASC) tablet 5 mg, 5 mg, Oral, Q24H, Ostergard, Joyice Faster, MD, 5 mg at 04/18/18 2108 .  cholecalciferol (VITAMIN D3) tablet 4,000 Units, 4,000 Units, Oral, Daily, Judith Part, MD, 4,000 Units at 04/19/18 [phone] .  docusate sodium (COLACE) capsule 100 mg, 100 mg, Oral, BID, Ostergard, Thomas A, MD .  famotidine (PEPCID) tablet 20 mg, 20 mg, Oral, Daily, Ostergard, Thomas A, MD .  heparin injection 5,000 Units, 5,000 Units, Subcutaneous, Q8H, Judith Part, MD, 5,000 Units at 04/19/18 [phone] .  HYDROcodone-acetaminophen (NORCO/VICODIN) 5-325 MG per  tablet 1 tablet, 1 tablet, Oral, Q4H PRN, Ostergard, Thomas A, MD .  HYDROmorphone (DILAUDID) injection 0.5 mg, 0.5 mg, Intravenous, Q3H PRN, Judith Part, MD .  irbesartan (AVAPRO) tablet 300 mg, 300 mg, Oral, Daily, Judith Part, MD, 300 mg at 04/19/18 0924 .  labetalol (NORMODYNE,TRANDATE) injection 10-40 mg, 10-40 mg, Intravenous, Q10 min PRN, Judith Part, MD, 20 mg at 04/18/18 1842 .  meclizine (ANTIVERT) tablet 12.5 mg, 12.5 mg, Oral, TID PRN, Judith Part, MD, 12.5 mg at 04/18/18 1105 .  MEDLINE mouth rinse, 15 mL, Mouth Rinse, BID, Ostergard, Joyice Faster, MD, 15 mL at 04/19/18 0916 .  ondansetron (ZOFRAN) tablet 4 mg, 4 mg, Oral, Q4H PRN **OR** ondansetron (ZOFRAN) injection 4 mg, 4 mg, Intravenous, Q4H PRN, Judith Part, MD, 4 mg at 04/17/18 2017 .  polyethylene glycol (MIRALAX / GLYCOLAX) packet 17 g, 17 g, Oral, Daily PRN, Ostergard, Thomas A, MD .  polyvinyl alcohol (LIQUIFILM TEARS) 1.4 % ophthalmic solution 1 drop, 1 drop, Right Eye, QID, Ostergard, Joyice Faster, MD, 1 drop at 04/19/18 0913 .  promethazine (PHENERGAN) injection 12.5 mg, 12.5 mg, Intravenous, Q4H PRN, Judith Part, MD, 12.5 mg at 04/18/18 1337 .  promethazine (PHENERGAN) tablet 12.5-25 mg, 12.5-25 mg, Oral, Q4H PRN, Judith Part, MD .  tamsulosin (FLOMAX) capsule 0.4 mg, 0.4 mg, Oral, Daily, Ostergard, Thomas A, MD, 0.4 mg at 04/18/18 1105 .  vitamin B-12 (CYANOCOBALAMIN) tablet 500 mcg, 500 mcg, Oral, Daily, Ostergard, Thomas A, MD, 500 mcg at 04/19/18 2878 .  zinc sulfate capsule 220 mg, 220 mg, Oral, Daily, Judith Part, MD, Stopped at 04/16/18 1824  Patients Current Diet:     Diet Order                  DIET - DYS 1 Room service appropriate? Yes; Fluid consistency: Thin  Diet effective now               Precautions / Restrictions Precautions  Precautions: Fall, Other (comment) Precaution Comments: dizziness and nausea, h/o falls, double  vision Other Brace: glasses with opaque occlusion Restrictions Weight Bearing Restrictions: No   Has the patient had 2 or more falls or a fall with injury in the past year?No once in past year per her report  Prior Activity Level Community (5-7x/wk): Mod I with cane; does not drive  Development worker, international aid / Equipment Home Assistive Devices/Equipment: CBG Meter, Eyeglasses, Cane (specify quad or straight), Blood pressure cuff, Walker (specify type) Home Equipment: Cane - single point, Walker - 2 wheels, Shower seat  Prior Device Use: Indicate devices/aids used by the patient prior to current illness, exacerbation or injury? cane  Prior Functional Level Prior Function Level of Independence: Independent with assistive device(s) Comments: ADLs and IADLs. Uses a cane for functional mobility. Retired and can drive but hasnt in three years. due to her ataxia  Self Care: Did the patient need help bathing, dressing, using the toilet or eating?  Independent  Indoor Mobility: Did the patient need assistance with walking from room to room (with or without device)? Independent  Stairs: Did the patient need assistance with internal or external stairs (with or without device)? Independent  Functional Cognition: Did the patient need help planning regular tasks such as shopping or remembering to take medications? Independent  Current Functional Level Cognition  Overall Cognitive Status: Impaired/Different from baseline Current Attention Level: Sustained Orientation Level: Oriented X4 Following Commands: Follows one step commands inconsistently, Follows one step commands with increased time Safety/Judgement: Decreased awareness of safety, Decreased awareness of deficits General Comments: Not sure if pt is not HOH as well making communication difficult (needs repetition).  He expressive speech difficulty doesn't help our ability to comprehend her as well.     Extremity  Assessment (includes Sensation/Coordination)  Upper Extremity Assessment: LUE deficits/detail LUE Deficits / Details: Edema at left hand with some decreased strength.  LUE Coordination: decreased fine motor  Lower Extremity Assessment: Defer to PT evaluation RLE Deficits / Details: right leg with h/o R TKA, pt able to lift bil legs against gravity, so grossly 3- to 3/5 per functional assessment.  RLE Coordination: decreased gross motor    ADLs  Overall ADL's : Needs assistance/impaired Eating/Feeding: Set up, Supervision/ safety, Sitting Eating/Feeding Details (indicate cue type and reason): Supported sitting Grooming: Dance movement psychotherapist, Min guard, Sitting Grooming Details (indicate cue type and reason): Min Guard A for safety in sitting to wash her face Upper Body Bathing: Minimal assistance, Sitting Lower Body Bathing: Maximal assistance, Sit to/from stand Upper Body Dressing : Minimal assistance, Sitting Lower Body Dressing: Maximal assistance, Sit to/from stand Toileting- Clothing Manipulation and Hygiene: Total assistance, Bed level Toileting - Clothing Manipulation Details (indicate cue type and reason): Pt rolling and required Total A for toilet hygiene after bowel incontience in bed Functional mobility during ADLs: Minimal assistance, +2 for physical assistance(sit<>stand) General ADL Comments: Pt presenting with decreased strength and balance. Pt limited by nausea, dizziness, and nystagmus    Mobility  Overal bed mobility: Needs Assistance Bed Mobility: Rolling, Sidelying to Sit Rolling: Min assist Sidelying to sit: Mod assist Sit to sidelying: Mod assist, +2 for physical assistance General bed mobility comments: Min assist to support pt in progressing her legs EOB to get to side lying and to help her find the bed rail with her right hand.  Mod assist at trunk and legs to come to sitting EOB as pt with imbalance during transitions, reaching for end of bed for support and  right  preference leaning.  Cues to find target on OT's face to focus on to help the dizziness.     Transfers  Overall transfer level: Needs assistance Equipment used: 2 person hand held assist Transfers: Sit to/from Stand, Stand Pivot Transfers Sit to Stand: +2 physical assistance, Mod assist Stand pivot transfers: +2 physical assistance, Mod assist General transfer comment: Two person mod assist to stand and pivot to the recliner chair on pt's left, she continues in standing to have a right lateral lean and has more difficulty moving right leg around during pivotal steps to the chair.      Ambulation / Gait / Stairs / Wheelchair Mobility  Ambulation/Gait General Gait Details: unable to yet, but progressing that way.  Today is her first day out of bed.      Posture / Balance Dynamic Sitting Balance Sitting balance - Comments: Pt is reliant on bil UE support EOB. Therapist sat on her right as she was tipping (not pushing ) to the right.  Worked on reaching to the left and attempting more midline sitting balance, also worked on targeting in sitting to help with dizziness/nausea.  Blue emesis bag kept very close.  Balance Overall balance assessment: Needs assistance Sitting-balance support: Feet supported, Bilateral upper extremity supported Sitting balance-Leahy Scale: Poor Sitting balance - Comments: Pt is reliant on bil UE support EOB. Therapist sat on her right as she was tipping (not pushing ) to the right.  Worked on reaching to the left and attempting more midline sitting balance, also worked on targeting in sitting to help with dizziness/nausea.  Blue emesis bag kept very close.  Postural control: Right lateral lean Standing balance support: Bilateral upper extremity supported Standing balance-Leahy Scale: Poor Standing balance comment: two person mod hand held assist with R lateral lean in standing.     Special needs/care consideration BiPAP/CPAP n/a CPM n/a Continuous Drip IV  n/a Dialysis n/a Life Vest n/a Oxygen n/a Special Bed n/a Trach Size n/a Wound Vac n/a Skin right head surgical incision Bowel mgmt: continent LBM 04/18/2018 Bladder mgmt: external catheter Diabetic mgmt yes   Previous Home Environment Living Arrangements: Spouse/significant other  Lives With: Spouse Available Help at Discharge: Family, Available 24 hours/day Type of Home: House Home Layout: Two level, Able to live on main level with bedroom/bathroom Home Access: Stairs to enter Entrance Stairs-Rails: None Entrance Stairs-Number of Steps: 2 Bathroom Shower/Tub: Multimedia programmer: Standard Bathroom Accessibility: Yes How Accessible: Accessible via walker Kerby: No  Discharge Living Setting Plans for Discharge Living Setting: Patient's home, Lives with (comment)(spouse) Type of Home at Discharge: House Discharge Home Layout: Two level, Able to live on main level with bedroom/bathroom Discharge Home Access: Stairs to enter Entrance Stairs-Rails: None Entrance Stairs-Number of Steps: 2 Discharge Bathroom Shower/Tub: Walk-in shower Discharge Bathroom Toilet: Handicapped height Discharge Bathroom Accessibility: Yes How Accessible: Accessible via walker Does the patient have any problems obtaining your medications?: No  Social/Family/Support Systems Patient Roles: Spouse, Parent Contact Information: spouse, Aaron Edelman Anticipated Caregiver: spouse Anticipated Ambulance person Information: see above Ability/Limitations of Caregiver: no limitations Caregiver Availability: 24/7 Discharge Plan Discussed with Primary Caregiver: Yes Is Caregiver In Agreement with Plan?: Yes Does Caregiver/Family have Issues with Lodging/Transportation while Pt is in Rehab?: No  Goals/Additional Needs Patient/Family Goal for Rehab: Mod I to supervision with PT, OT, and SLP Expected length of stay: [facility]OS 13 to 19 days Pt/Family Agrees to Admission and willing to  participate: Yes Program Orientation Provided & Reviewed  with Pt/Caregiver Including Roles  & Responsibilities: Yes  Decrease burden of Care through IP rehab admission: n/a  Possible need for SNF placement upon discharge: not anticipated  Patient Condition: This patient's condition remains as documented in the consult dated 04/18/2018, in which the Rehabilitation Physician determined and documented that the patient's condition is appropriate for intensive rehabilitative care in an inpatient rehabilitation facility. Will admit to inpatient rehab today.  Preadmission Screen Completed By:  Cleatrice Burke, 04/19/2018 1:03 PM ______________________________________________________________________   Discussed status with Dr. Naaman Plummer on 04/19/2018 at  1302 and received telephone approval for admission today.  Admission Coordinator:  Cleatrice Burke, time 6940 Date 04/19/2018           Cosigned by: [name], MD at 04/19/2018 1:06 PM  Revision History

## 2018-04-19 NOTE — H&P (Signed)
Physical Medicine and Rehabilitation Admission H&P     CC: Functional decline past tumor    HPI: Natalie Friedman is a 68 year old female with history of OA, CKD, T2DM, 2-3 years history of ataxia with difficulty walking progressing to hearing loss on the right, double vision and recent falls. She was found to have right CP mass and was admitted on 04/16/18 for right rectosigmoid craniotomy for resection of mass by Dr. Zada Finders. Post op with right facial palsy, dysarthria as well as dizziness with N/V. Blood pressures have been labile and she continues to requires oxygen due to hypoxia. BSS done and diet downgraded to dysphagia 1 due to oral phase dysphagia with sensorimotor deficits.  Therapy ongoing and patient continues to have limitations in mobility and ability to carry out ADLs. CIR recommended due to functional deficits.      Review of Systems  Constitutional: Negative for chills and fever.  HENT: Positive for hearing loss. Negative for tinnitus.   Eyes: Positive for blurred vision and double vision.  Respiratory: Negative for cough and shortness of breath.   Cardiovascular: Positive for leg swelling. Negative for chest pain and palpitations.  Gastrointestinal: Negative for abdominal pain, heartburn and nausea.  Genitourinary: Negative for dysuria and urgency.  Musculoskeletal: Positive for joint pain (right knee).  Skin: Negative for itching and rash.  Neurological: Positive for dizziness, focal weakness, weakness and headaches.  Psychiatric/Behavioral: The patient is not nervous/anxious and does not have insomnia.           Past Medical History:  Diagnosis Date  . Anxiety    . Arthritis    . CHF (congestive heart failure) ([place])    . Chronic kidney disease    . Depression    . Diabetes mellitus without complication ([place])    . Hypertension    . Neuropathy    . Occult spina bifida             Past Surgical History:  Procedure Laterality Date  . APPLICATION OF  CRANIAL NAVIGATION N/A 04/16/2018    Procedure: APPLICATION OF CRANIAL NAVIGATION;  Surgeon: Judith Part, MD;  Location: [facility];  Service: Neurosurgery;  Laterality: N/A;  . CRANIOTOMY Right 04/16/2018    Procedure: Right craniotomy for tumor resection with brainlab;  Surgeon: Judith Part, MD;  Location: [facility];  Service: Neurosurgery;  Laterality: Right;  right  . ectopic      . INCISION AND DRAINAGE Right 10/04/2016    Procedure: INCISION AND DRAINAGE AND ASPIRATION;  Surgeon: Carole Civil, MD;  Location: [facility];  Service: Orthopedics;  Laterality: Right;  right knee wound  . patella fx Right 07/2015  . TOTAL KNEE ARTHROPLASTY Right 07/20/2016    Procedure: TOTAL KNEE ARTHROPLASTY;  Surgeon: Carole Civil, MD;  Location: [facility];  Service: Orthopedics;  Laterality: Right;           Family History  Problem Relation Age of Onset  . Cancer Mother          colon  . Cancer - Colon Father        Social History:  Married. Independent but has had balance issues with difficulty walking for the past 3 years. Husband assists with home management. She reports that she has never smoked. She has never used smokeless tobacco. She reports that she does not drink alcohol or use drugs.           Allergies  Allergen Reactions  .  Corticosteroids Other (See Comments)      Hallucinations/swelling in legs & feet/passed out (ORAL ROUTE ONLY PER PATIENT)    . Gluten Meal Other (See Comments)      Over time increases inflammation in body  . Oxycodone Other (See Comments)      TOO STRONG, RESPIRATORY DEPRESSION               Facility-Administered Medications Prior to Admission  Medication Dose Route Frequency Provider Last Rate Last Dose  . doxycycline (VIBRA-TABS) tablet 100 mg  100 mg Oral Q12H Carole Civil, MD              Medications Prior to Admission  Medication Sig Dispense Refill  . Alpha-Lipoic Acid 200 MG TABS Take 400 mg by mouth daily.       Marland Kitchen amLODipine  (NORVASC) 5 MG tablet Take 5 mg by mouth daily.      . Cholecalciferol (VITAMIN D) 50 MCG (2000 UT) CAPS Take 4,000 Units by mouth daily.      . Coenzyme Q10 (COQ10) 150 MG CAPS Take 150 mg by mouth daily.      . Glucosamine-Chondroit-Vit C-Mn (GLUCOSAMINE 1500 COMPLEX PO) Take 1 tablet by mouth daily.      . Homeopathic Products (ARNICA [place] PO) Take 1 tablet by mouth 2 (two) times daily as needed (pain).      Marland Kitchen olmesartan (BENICAR) 40 MG tablet Take 40 mg by mouth daily.   0  . vitamin B-12 (CYANOCOBALAMIN) 500 MCG tablet Take 500 mcg by mouth daily.      . Zinc 30 MG TABS Take 30 mg by mouth daily.      Marland Kitchen aspirin EC 325 MG EC tablet Take 1 tablet (325 mg total) by mouth daily with breakfast. (Patient not taking: Reported on 06/06/2017) 30 tablet 0  . doxycycline (VIBRAMYCIN) 100 MG capsule Take 1 capsule (100 mg total) by mouth 2 (two) times daily. (Patient not taking: Reported on 04/01/2018) 56 capsule 1  . Homeopathic Products (TRAUMEEL EX) Apply 1 application topically 2 (two) times daily as needed (pain).      . insulin detemir (LEVEMIR) 100 UNIT/ML injection Inject 0.1 mLs (10 Units total) into the skin at bedtime. (Patient not taking: Reported on 03/09/2017) 10 mL 11  . losartan (COZAAR) 100 MG tablet Take 1 tablet (100 mg total) by mouth daily. (Patient not taking: Reported on 10/03/2017) 30 tablet 0  . potassium chloride SA (K-DUR,KLOR-CON) 10 MEQ tablet Take 1 tablet (10 mEq total) by mouth daily. (Patient not taking: Reported on 03/09/2017) 30 tablet 0  . torsemide (DEMADEX) 20 MG tablet Take 2 tablets (40 mg total) by mouth daily. (Patient taking differently: Take 40 mg by mouth daily as needed (edema). ) 30 tablet 0      Drug Regimen Review  Drug regimen was reviewed and remains appropriate with no significant issues identified   Home: Home Living Family/patient expects to be discharged to:: Private residence Living Arrangements: Spouse/significant other Available Help at  Discharge: Family, Available 24 hours/day Type of Home: House Home Access: Stairs to enter [employer] of Steps: 2 Entrance Stairs-Rails: None Home Layout: Two level, Able to live on main level with bedroom/bathroom Bathroom Shower/Tub: Multimedia programmer: Standard Bathroom Accessibility: Yes Home Equipment: Kasandra Knudsen - single point, Environmental consultant - 2 wheels, Shower seat  Lives With: Spouse   Functional History: Prior Function Level of Independence: Independent with assistive device(s) Comments: ADLs and IADLs. Uses a cane for functional  mobility. Retired and can drive but hasnt in three years.   Functional Status:  Mobility: Bed Mobility Overal bed mobility: Needs Assistance Bed Mobility: Rolling, Sidelying to Sit, Sit to Sidelying Rolling: Min assist, +2 for physical assistance(especially to the left) Sidelying to sit: Min assist, +2 for physical assistance Sit to sidelying: Mod assist, +2 for physical assistance General bed mobility comments: Min assist to roll bil, easier to roll to the right (pt also prefers to sleep/stay on the right side as she is more dizzy to the left), min assist to support trunk and separately legs to get to sitting EOB.  Second person there, but not really needed.  Mod assist to help lift both legs back into the bed.  Pt relying heavily on bed rail for support during transitions.  Transfers Overall transfer level: Needs assistance Equipment used: Rolling walker (2 wheeled) Transfers: Sit to/from Stand Sit to Stand: Min assist, +2 physical assistance General transfer comment: Heavy two person min assist to stand from bed with RW.  Assist needed the most once fully upright for stability and assurance that she was ok, not going to fall over.  Pt was too nauseated to take any pivotal steps to the chair.  Attempted one eye occlusion in standing to help with double vision.  Glasses were donned for mobility.    ADL: ADL Overall ADL's : Needs  assistance/impaired Eating/Feeding: Set up, Supervision/ safety, Sitting Eating/Feeding Details (indicate cue type and reason): Supported sitting Grooming: Dance movement psychotherapist, Min guard, Sitting Grooming Details (indicate cue type and reason): Min Guard A for safety in sitting to wash her face Upper Body Bathing: Minimal assistance, Sitting Lower Body Bathing: Maximal assistance, Sit to/from stand Upper Body Dressing : Minimal assistance, Sitting Lower Body Dressing: Maximal assistance, Sit to/from stand Toileting- Clothing Manipulation and Hygiene: Total assistance, Bed level Toileting - Clothing Manipulation Details (indicate cue type and reason): Pt rolling and required Total A for toilet hygiene after bowel incontience in bed Functional mobility during ADLs: Minimal assistance, +2 for physical assistance(sit<>stand) General ADL Comments: Pt presenting with decreased strength and balance. Pt limited by nausea, dizziness, and nystagmus   Cognition: Cognition Overall Cognitive Status: Within Functional Limits for tasks assessed Orientation Level: Oriented to person, Oriented to time, Oriented to situation, Disoriented to place Cognition Arousal/Alertness: Awake/alert Behavior During Therapy: Flat affect Overall Cognitive Status: Within Functional Limits for tasks assessed General Comments: Pt difficult to understand at times, but general conversation is normal, recognizes her husband.      Blood pressure (!) 185/83, pulse 67, temperature 98.6 F (37 C), temperature source Oral, resp. rate 16, height 5\' 6"  (1.676 m), weight 104.8 kg, SpO2 100 %. Physical Exam  Nursing note and vitals reviewed. Constitutional: She is oriented to person, place, and time. She appears well-developed and well-nourished. She is sleeping.  Fatigued appearing.   HENT:  Right crani incision   Eyes: Right eye exhibits no discharge. Left eye exhibits no discharge. No scleral icterus.  Neck: No JVD present.    Cardiovascular: Normal rate. Exam reveals no friction rub.  No murmur heard. Respiratory: Effort normal. No stridor. No respiratory distress. She has no wheezes.  GI: Soft. She exhibits no distension. There is no abdominal tenderness. There is no rebound.  Musculoskeletal:        General: No edema.  Neurological: She is oriented to person, place, and time.  Right facial weakness with dysarthria. Unable to close right eye lid. 3-4 beats of nystagmus with rightward gaze. Right  limb ataxia. Strength 3-4/5 prox to distal.  Able to answer orientation questions and follow simple motor commands.   Skin: Skin is warm.  Crani incision CDI  Psychiatric: She has a normal mood and affect. Her behavior is normal.      Lab Results Last 48 Hours        Results for orders placed or performed during the hospital encounter of 04/16/18 (from the past 48 hour(s))  CBC     Status: Abnormal    Collection Time: 04/16/18  5:15 PM  Result Value Ref Range    WBC 10.5 4.0 - 10.5 K/uL    RBC 3.53 (L) 3.87 - 5.11 MIL/uL    Hemoglobin 10.5 (L) 12.0 - 15.0 g/dL    HCT 32.6 (L) 36.0 - 46.0 %    MCV 92.4 80.0 - 100.0 fL    MCH 29.7 26.0 - 34.0 pg    MCHC 32.2 30.0 - 36.0 g/dL    RDW 11.2 (L) 11.5 - 15.5 %    Platelets 160 150 - 400 K/uL    nRBC 0.0 0.0 - 0.2 %      Comment: Performed at [facility], [place] [address], [place], [place] [address]  Creatinine, serum     Status: Abnormal    Collection Time: 04/16/18  5:15 PM  Result Value Ref Range    Creatinine, Ser 2.23 (H) 0.44 - 1.00 mg/dL    GFR calc non Af Amer 22 (L) >60 mL/min    GFR calc Af Amer 25 (L) >60 mL/min      Comment: Performed at [facility] [address] [place], [place] [address]  Renal function panel     Status: Abnormal    Collection Time: 04/17/18  8:14 AM  Result Value Ref Range    Sodium 141 135 - 145 mmol/L    Potassium 4.2 3.5 - 5.1 mmol/L    Chloride 108 98 - 111 mmol/L    CO2 24 22 - 32 mmol/L    Glucose, Bld 252  (H) 70 - 99 mg/dL    BUN 32 (H) 8 - 23 mg/dL    Creatinine, Ser 2.54 (H) 0.44 - 1.00 mg/dL    Calcium 9.2 8.9 - 10.3 mg/dL    Phosphorus 4.3 2.5 - 4.6 mg/dL    Albumin 2.6 (L) 3.5 - 5.0 g/dL    GFR calc non Af Amer 19 (L) >60 mL/min    GFR calc Af Amer 22 (L) >60 mL/min    Anion gap 9 5 - 15      Comment: Performed at [facility] [address] [place], [place] [address]  Glucose, capillary     Status: Abnormal    Collection Time: 04/17/18  5:33 PM  Result Value Ref Range    Glucose-Capillary 239 (H) 70 - 99 mg/dL       Imaging Results (Last 48 hours)  Mr Brain Wo Contrast   Result Date: 04/17/2018 CLINICAL DATA:  Follow-up examination status post neoplasm resection. EXAM: MRI HEAD WITHOUT CONTRAST TECHNIQUE: Multiplanar, multiecho pulse sequences of the brain and surrounding structures were obtained without intravenous contrast. COMPARISON:  Prior CT from 04/15/2018 as well as previous brain MRI from 03/22/2018. 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 Small amount of postoperative fluid and/or blood products present within the perimesencephalic and cerebellopontine angle cisterns. Superimposed scattered foci of pneumocephalus seen within this region  as well as a few additional scattered foci elsewhere within the brain. Previously identified right CP angle vestibular schwannoma has largely been resected. Small approximately 1 cm focus of somewhat nodular T2/FLAIR signal abnormality at the deep aspect of the right CP angle adjacent to the right medulla could reflect a small amount of residual tumor, not entirely certain given lack of IV contrast (series 8, image 7). There is likely a small amount of residual tumor within the right IAC itself, which is relatively stable in appearance as compared to preoperative exam. Persistent mass effect on the adjacent right brainstem  and brachium pontis which remain mildly effaced and compressed towards the left, similar to slightly improved relative to previous exam. Fourth ventricle mildly distorted but remains patent. Associated T2/FLAIR signal abnormality with restricted diffusion within the adjacent right brachium pontis and lateral right cerebellar hemisphere, consistent with small infarct, predominantly right AICA territory. No frank hemorrhagic transformation. No other complication identified. Appearance of the brain otherwise stable with underlying advanced age-related cerebral atrophy with extensive chronic microvascular ischemic disease. Multiple superimposed remote lacunar infarcts noted within the bilateral basal ganglia and left thalamus. Multiple chronic micro hemorrhages again seen scattered throughout the supratentorial and infratentorial brain, most likely related to poorly controlled hypertension. No other mass lesion. No significant midline shift. No hydrocephalus or ventricular trapping. No unexpected extra-axial collection. Vascular: Major intracranial vascular flow voids maintained. Skull and upper cervical spine: Craniocervical junction within normal limits. Sequelae of interval right retrosigmoid craniotomy. Sinuses/Orbits: Globes and orbital soft tissues within normal limits. Scattered mucosal thickening throughout the paranasal sinuses with trace layering fluid within the left sphenoid sinus. Well-circumscribed 16 mm T2 hypointense lesion at the right ethmoidal air cells again noted, stable. Postoperative changes noted within the right mastoid air cells. Other: None. IMPRESSION: 1. Postoperative changes from interval right retrosigmoid craniotomy for resection of right cerebellar pontine angle vestibular schwannoma. Normal expected small amount of postoperative fluid and/or blood products within the perimesencephalic and right CP angle cisterns. Persistent mass effect on the adjacent pons and right brachium pontis,  stable to slightly improved relative to previous exam. Question small focus of residual tumor at the deep aspect of the right CP angle cistern adjacent to the right medulla as above. Attention on follow-up recommended. 2. Patchy small volume ischemic infarcts within the right brachium pontis and peripheral right cerebellar hemisphere, predominantly right AICA territory. 3. Otherwise relatively stable appearance of the brain. No other acute intracranial abnormality identified. Electronically Signed   By: Jeannine Boga M.D.   On: 04/17/2018 00:45         Medical Problem List and Plan: 1.  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  2.  DVT Prophylaxis/Anticoagulation: Pharmaceutical: Lovenox 3. Chronic back pain/Pain Management: Tylenol prn 4. Mood: LCSW to follow for evaluation and support.  5. Neuropsych: This patient is capable of making decisions on her own behalf. 6. Skin/Wound Care: Routine pressure relief measures. Monitor wound for healing.  7. Fluids/Electrolytes/Nutrition: Monitor I/O. Check lytes in am.  8. T2DM: Hgb A1c- 6.5. Monitor BS ac/hs. Use SSI for elevated BS--is on very limited diet.  9. Acute on chronic renal failure: Baseline SCr- 2.4---> 2.54.              -encourage PO                        -  serial labs  10. Occult spina bifida with neuropathy: Managed with OTC supplements.  11.  Leucocytosis: Monitor for signs of infection.  12. Acute blood loss anemia: recheck CBC in am for stability 13. Vestibular dysfunction: N/V with dizziness improving.             -treat supportively    14. HTN: BP still labile---Monitor BP bid. Continue Avapro and Norvasc.  15. Hypoxia: Encourage IS. Wean oxygen to off.  16. Right CN VII palsy:             -eye patch or taping to cover eye             -eye lubricants   Post Admission Physician Evaluation: 1. Functional deficits secondary   to right-sided vestibular schwannoma s/p resection. 2. Patient is admitted to receive collaborative, interdisciplinary care between the physiatrist, rehab nursing staff, and therapy team. 3. Patient's level of medical complexity and substantial therapy needs in context of that medical necessity cannot be provided at a lesser intensity of care such as a SNF. 4. Patient has experienced substantial functional loss from his/her baseline which was documented above under the "Functional History" and "Functional Status" headings.  Judging by the patient's diagnosis, physical exam, and functional history, the patient has potential for functional progress which will result in measurable gains while on inpatient rehab.  These gains will be of substantial and practical use upon discharge  in facilitating mobility and self-care at the household level. 5. Physiatrist will provide 24 hour management of medical needs as well as oversight of the therapy plan/treatment and provide guidance as appropriate regarding the interaction of the two. 6. The Preadmission Screening has been reviewed and patient status is unchanged unless otherwise stated above. 7. 24 hour rehab nursing will assist with bladder management, bowel management, safety, skin/wound care, disease management, medication administration, pain management and patient education  and help integrate therapy concepts, techniques,education, etc. 8. PT will assess and treat for/with: Lower extremity strength, range of motion, stamina, balance, functional mobility, safety, adaptive techniques and equipment, NMR, vestibular mgt, family education, pain control.   Goals are: supervision to mod I. 9. OT will assess and treat for/with: ADL's, functional mobility, safety, upper extremity strength, adaptive techniques and equipment, vestibular rx, NMR, ego support, family ed.   Goals are: supervision to mod I. Therapy may proceed with showering this patient. 10. SLP will assess  and treat for/with: speech, swallowing.  Goals are: mod I. 11. Case Management and Social Worker will assess and treat for psychological issues and discharge planning. 12. Team conference will be held weekly to assess progress toward goals and to determine barriers to discharge. 13. Patient will receive at least 3 hours of therapy per day at least 5 days per week. 14. ELOS: 13-19 days       15. Prognosis:  excellent     I have personally performed a face to face diagnostic evaluation of this patient and formulated the key components of the plan.  Additionally, I have personally reviewed laboratory data, imaging studies, as well as relevant notes and concur with the physician assistant's documentation above.   [name], MD, Mell[facility] Drown     Bary Leriche, PA-C 04/18/2018

## 2018-04-19 NOTE — Progress Notes (Signed)
[name], MD  Physician  Physical Medicine and Rehabilitation  Consult Note  Addendum  Date of Service:  04/18/2018 8:34 AM       Related encounter: Admission (Discharged) from 04/16/2018 in [facility] All Collapse All    Show:Clear all [x] Manual[x] Template[] Copied  Added by: [x] Love, Ivan Anchors, PA-C[x] [name], MD  [] Hover for details      Physical Medicine and Rehabilitation Consult   Reason for Consult: Functional deficits post schwannoma resection. Referring Physician:  Dr. Zada Finders.    HPI: Natalie Friedman is a 68 y.o. female with history of OA, CKD, T2DM, 2-3 years history of ataxia with difficulty walking, double vision, hearing loss and recent falls due to imbalance. 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 Zada Finders.  Post with right facial palsy and dysarthria. Therapy evaluation done yesterday revealing diplopia, N/V and nystagmus affecting gait and ADL tasks. CIR recommended due to functional decline.    Review of Systems  Constitutional: Negative for chills and fever.  HENT: Positive for hearing loss (right ear).   Eyes: Positive for blurred vision and double vision.  Respiratory: Positive for shortness of breath. Negative for cough.   Cardiovascular: Negative for chest pain and palpitations.  Gastrointestinal: Positive for nausea and vomiting. Negative for abdominal pain.  Genitourinary: Negative for dysuria and urgency.  Musculoskeletal: Positive for back pain (gets chiropractic adjustments every few monts), falls (for the past few weeks) and myalgias.  Skin: Positive for itching (in hands and feet).  Neurological: Positive for dizziness, sensory change, speech change and weakness. Negative for headaches.  Psychiatric/Behavioral: The patient does not have insomnia.            Past Medical History:  Diagnosis Date  . Anxiety   . Arthritis   . CHF (congestive heart failure) ([place])   . Chronic kidney disease   . Depression   . Diabetes mellitus without complication ([place])   . Hypertension   . Neuropathy   . Occult spina bifida          Past Surgical History:  Procedure Laterality Date  . APPLICATION OF CRANIAL NAVIGATION N/A 04/16/2018   Procedure: APPLICATION OF CRANIAL NAVIGATION;  Surgeon: Judith Part, MD;  Location: [facility];  Service: Neurosurgery;  Laterality: N/A;  . CRANIOTOMY Right 04/16/2018   Procedure: Right craniotomy for tumor resection with brainlab;  Surgeon: Judith Part, MD;  Location: [facility];  Service: Neurosurgery;  Laterality: Right;  right  . ectopic    . INCISION AND DRAINAGE Right 10/04/2016   Procedure: INCISION AND DRAINAGE AND ASPIRATION;  Surgeon: Carole Civil, MD;  Location: [facility];  Service: Orthopedics;  Laterality: Right;  right knee wound  . patella fx Right 07/2015  . TOTAL KNEE ARTHROPLASTY Right 07/20/2016   Procedure: TOTAL KNEE ARTHROPLASTY;  Surgeon: Carole Civil, MD;  Location: [facility];  Service: Orthopedics;  Laterality: Right;         Family History  Problem Relation Age of Onset  . Cancer Mother        colon  . Cancer - Colon Father      Social History:  Married. 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 Has had difficulty with mobility since TKR 3 years ago.  Husband in good health. She reports that she has never smoked. She has never used smokeless tobacco. She reports that she does not drink alcohol or use drugs.    Allergies  Allergen Reactions  . Corticosteroids Other (See Comments)    Hallucinations/swelling in legs & feet/passed out (ORAL ROUTE ONLY PER PATIENT)   . Gluten Meal Other (See Comments)    Over time increases inflammation in body  . Oxycodone Other (See Comments)    TOO  STRONG, RESPIRATORY DEPRESSION             Facility-Administered Medications Prior to Admission  Medication Dose Route Frequency Provider Last Rate Last Dose  . doxycycline (VIBRA-TABS) tablet 100 mg  100 mg Oral Q12H Carole Civil, MD             Medications Prior to Admission  Medication Sig Dispense Refill  . Alpha-Lipoic Acid 200 MG TABS Take 400 mg by mouth daily.     Marland Kitchen amLODipine (NORVASC) 5 MG tablet Take 5 mg by mouth daily.    . Cholecalciferol (VITAMIN D) 50 MCG (2000 UT) CAPS Take 4,000 Units by mouth daily.    . Coenzyme Q10 (COQ10) 150 MG CAPS Take 150 mg by mouth daily.    . Glucosamine-Chondroit-Vit C-Mn (GLUCOSAMINE 1500 COMPLEX PO) Take 1 tablet by mouth daily.    . Homeopathic Products (ARNICA [place] PO) Take 1 tablet by mouth 2 (two) times daily as needed (pain).    Marland Kitchen olmesartan (BENICAR) 40 MG tablet Take 40 mg by mouth daily.  0  . vitamin B-12 (CYANOCOBALAMIN) 500 MCG tablet Take 500 mcg by mouth daily.    . Zinc 30 MG TABS Take 30 mg by mouth daily.    Marland Kitchen aspirin EC 325 MG EC tablet Take 1 tablet (325 mg total) by mouth daily with breakfast. (Patient not taking: Reported on 06/06/2017) 30 tablet 0  . doxycycline (VIBRAMYCIN) 100 MG capsule Take 1 capsule (100 mg total) by mouth 2 (two) times daily. (Patient not taking: Reported on 04/01/2018) 56 capsule 1  . Homeopathic Products (TRAUMEEL EX) Apply 1 application topically 2 (two) times daily as needed (pain).    . insulin detemir (LEVEMIR) 100 UNIT/ML injection Inject 0.1 mLs (10 Units total) into the skin at bedtime. (Patient not taking: Reported on 03/09/2017) 10 mL 11  . losartan (COZAAR) 100 MG tablet Take 1 tablet (100 mg total) by mouth daily. (Patient not taking: Reported on 10/03/2017) 30 tablet 0  . potassium chloride SA (K-DUR,KLOR-CON) 10 MEQ tablet Take 1 tablet (10 mEq total) by mouth daily. (Patient not taking: Reported on 03/09/2017) 30 tablet 0  . torsemide (DEMADEX) 20 MG  tablet Take 2 tablets (40 mg total) by mouth daily. (Patient taking differently: Take 40 mg by mouth daily as needed (edema). ) 30 tablet 0    Home: Home Living Family/patient expects to be discharged to:: Private residence Living Arrangements: Spouse/significant other Available Help at Discharge: Family, Available 24 hours/day Type of Home: House Home Access: Stairs to enter [employer] of Steps: 2 Entrance Stairs-Rails: None Home Layout: Able to live on main level with bedroom/bathroom, Two level Bathroom Shower/Tub: Multimedia programmer: Standard Home Equipment: Urania - single point, Environmental consultant - 2 wheels, Shower seat  Functional History: Prior Function Level of Independence: Independent with assistive device(s) Comments: ADLs and IADLs. Uses a cane for functional mobility. Retired and can drive but hasnt in three years. Functional Status:  Mobility: Bed Mobility Overal bed mobility: Needs  Assistance Bed Mobility: Rolling, Sidelying to Sit, Sit to Sidelying Rolling: Min assist, +2 for physical assistance(especially to the left) Sidelying to sit: Min assist, +2 for physical assistance Sit to sidelying: Mod assist, +2 for physical assistance General bed mobility comments: Min assist to roll bil, easier to roll to the right (pt also prefers to sleep/stay on the right side as she is more dizzy to the left), min assist to support trunk and separately legs to get to sitting EOB.  Second person there, but not really needed.  Mod assist to help lift both legs back into the bed.  Pt relying heavily on bed rail for support during transitions.  Transfers Overall transfer level: Needs assistance Equipment used: Rolling walker (2 wheeled) Transfers: Sit to/from Stand Sit to Stand: Min assist, +2 physical assistance General transfer comment: Heavy two person min assist to stand from bed with RW.  Assist needed the most once fully upright for stability and assurance that she  was ok, not going to fall over.  Pt was too nauseated to take any pivotal steps to the chair.  Attempted one eye occlusion in standing to help with double vision.  Glasses were donned for mobility.   ADL: ADL Overall ADL's : Needs assistance/impaired Eating/Feeding: Set up, Supervision/ safety, Sitting Eating/Feeding Details (indicate cue type and reason): Supported sitting Grooming: Dance movement psychotherapist, Min guard, Sitting Grooming Details (indicate cue type and reason): Min Guard A for safety in sitting to wash her face Upper Body Bathing: Minimal assistance, Sitting Lower Body Bathing: Maximal assistance, Sit to/from stand Upper Body Dressing : Minimal assistance, Sitting Lower Body Dressing: Maximal assistance, Sit to/from stand Toileting- Clothing Manipulation and Hygiene: Total assistance, Bed level Toileting - Clothing Manipulation Details (indicate cue type and reason): Pt rolling and required Total A for toilet hygiene after bowel incontience in bed Functional mobility during ADLs: Minimal assistance, +2 for physical assistance(sit<>stand) General ADL Comments: Pt presenting with decreased strength and balance. Pt limited by nausea, dizziness, and nystagmus  Cognition: Cognition Overall Cognitive Status: Within Functional Limits for tasks assessed Orientation Level: Oriented X4 Cognition Arousal/Alertness: Awake/alert Behavior During Therapy: Flat affect Overall Cognitive Status: Within Functional Limits for tasks assessed General Comments: Pt difficult to understand at times, but general conversation is normal, recognizes her husband.    Blood pressure (!) 183/80, pulse 69, temperature 98.6 F (37 C), temperature source Oral, resp. rate 20, height 5\' 6"  (1.676 m), weight 104.8 kg, SpO2 99 %. Physical Exam  Nursing note and vitals reviewed. Constitutional: She is oriented to person, place, and time. She appears well-developed and well-nourished. She is easily aroused. No  distress. Nasal cannula in place.  Obese female, NAD  Eyes: Right eye exhibits no discharge. Left eye exhibits no discharge. No scleral icterus.  Musculoskeletal:     Comments: Well healed old R-TKR scar.   Neurological: She is alert, oriented to person, place, and time and easily aroused.  Right facial weakness with moderate dysarthria. Unable to close right eye lid. Slow speech but able to answer orientation questions without difficulty. Reasonable insight and awareness. RUE and RLE 2-3/5. LUE and LLE 3/5. Senses pain in all 4's.   Skin: She is not diaphoretic.    LabResultsLast24Hours  Results for orders placed or performed during the hospital encounter of 04/16/18 (from the past 24 hour(s))  Glucose, capillary     Status: Abnormal   Collection Time: 04/17/18  5:33 PM  Result Value Ref Range   Glucose-Capillary 239 (H) 70 -  99 mg/dL      ImagingResults(Last48hours)  X-ray Chest Pa Or Ap  Result Date: 04/16/2018 CLINICAL DATA:  Central line placement. EXAM: CHEST  1 VIEW COMPARISON:  Chest x-ray dated May 13, 2016. FINDINGS: Right internal jugular central venous catheter with the tip in the distal SVC. Stable cardiomegaly and mild pulmonary vascular congestion. Unchanged scarring/atelectasis at the left lung base. No focal consolidation, pleural effusion, or pneumothorax. No acute osseous abnormality. IMPRESSION: 1. Right internal jugular central venous catheter tip in the distal SVC. No pneumothorax. 2. Stable cardiomegaly and mild pulmonary vascular congestion. Electronically Signed   By: Titus Dubin M.D.   On: 04/16/2018 15:50   Mr Brain Wo Contrast  Result Date: 04/17/2018 CLINICAL DATA:  Follow-up examination status post neoplasm resection. EXAM: MRI HEAD WITHOUT CONTRAST TECHNIQUE: Multiplanar, multiecho pulse sequences of the brain and surrounding structures were obtained without intravenous contrast. COMPARISON:  Prior CT from 04/15/2018 as well as previous  brain MRI from 03/22/2018. 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 Small amount of postoperative fluid and/or blood products present within the perimesencephalic and cerebellopontine angle cisterns. Superimposed scattered foci of pneumocephalus seen within this region as well as a few additional scattered foci elsewhere within the brain. Previously identified right CP angle vestibular schwannoma has largely been resected. Small approximately 1 cm focus of somewhat nodular T2/FLAIR signal abnormality at the deep aspect of the right CP angle adjacent to the right medulla could reflect a small amount of residual tumor, not entirely certain given lack of IV contrast (series 8, image 7). There is likely a small amount of residual tumor within the right IAC itself, which is relatively stable in appearance as compared to preoperative exam. Persistent mass effect on the adjacent right brainstem and brachium pontis which remain mildly effaced and compressed towards the left, similar to slightly improved relative to previous exam. Fourth ventricle mildly distorted but remains patent. Associated T2/FLAIR signal abnormality with restricted diffusion within the adjacent right brachium pontis and lateral right cerebellar hemisphere, consistent with small infarct, predominantly right AICA territory. No frank hemorrhagic transformation. No other complication identified. Appearance of the brain otherwise stable with underlying advanced age-related cerebral atrophy with extensive chronic microvascular ischemic disease. Multiple superimposed remote lacunar infarcts noted within the bilateral basal ganglia and left thalamus. Multiple chronic micro hemorrhages again seen scattered throughout the supratentorial and infratentorial brain, most likely related to poorly controlled hypertension. No other mass lesion. No  significant midline shift. No hydrocephalus or ventricular trapping. No unexpected extra-axial collection. Vascular: Major intracranial vascular flow voids maintained. Skull and upper cervical spine: Craniocervical junction within normal limits. Sequelae of interval right retrosigmoid craniotomy. Sinuses/Orbits: Globes and orbital soft tissues within normal limits. Scattered mucosal thickening throughout the paranasal sinuses with trace layering fluid within the left sphenoid sinus. Well-circumscribed 16 mm T2 hypointense lesion at the right ethmoidal air cells again noted, stable. Postoperative changes noted within the right mastoid air cells. Other: None. IMPRESSION: 1. Postoperative changes from interval right retrosigmoid craniotomy for resection of right cerebellar pontine angle vestibular schwannoma. Normal expected small amount of postoperative fluid and/or blood products within the perimesencephalic and right CP angle cisterns. Persistent mass effect on the adjacent pons and right brachium pontis, stable to slightly improved relative to previous exam. Question small focus of residual tumor at the deep aspect of the right CP angle cistern adjacent to the right medulla as above. Attention on follow-up recommended.  2. Patchy small volume ischemic infarcts within the right brachium pontis and peripheral right cerebellar hemisphere, predominantly right AICA territory. 3. Otherwise relatively stable appearance of the brain. No other acute intracranial abnormality identified. Electronically Signed   By: Jeannine Boga M.D.   On: 04/17/2018 00:45      Assessment/Plan: Diagnosis: right sided vestibular schwannoma s/p right retrosigmoid craniotomy/resection 1. Does the need for close, 24 hr/day medical supervision in concert with the patient's rehab needs make it unreasonable for this patient to be served in a less intensive setting? Yes 2. Co-Morbidities requiring supervision/potential complications:  nutrition, pain, post-op considerations, swallowing 3. Due to bladder management, bowel management, safety, skin/wound care, disease management, medication administration, pain management and patient education, does the patient require 24 hr/day rehab nursing? Yes 4. Does the patient require coordinated care of a physician, rehab nurse, PT (1-2 hrs/day, 5 days/week), OT (1-2 hrs/day, 5 days/week) and SLP (1-2 hrs/day, 5 days/week) to address physical and functional deficits in the context of the above medical diagnosis(es)? Yes Addressing deficits in the following areas: balance, endurance, locomotion, strength, transferring, bowel/bladder control, bathing, dressing, feeding, grooming, toileting, speech, swallowing and psychosocial support 5. Can the patient actively participate in an intensive therapy program of at least 3 hrs of therapy per day at least 5 days per week? Yes 6. The potential for patient to make measurable gains while on inpatient rehab is excellent 7. Anticipated functional outcomes upon discharge from inpatient rehab are modified independent and supervision  with PT, modified independent and supervision with OT, modified independent and supervision with SLP. 8. Estimated rehab length of stay to reach the above functional goals is: 13-19 days 9. Anticipated D/C setting: Home 10. Anticipated post D/C treatments: HH therapy and Outpatient therapy 11. Overall Rehab/Functional Prognosis: excellent  RECOMMENDATIONS: This patient's condition is appropriate for continued rehabilitative care in the following setting: CIR Patient has agreed to participate in recommended program. Yes Note that insurance prior authorization may be required for reimbursement for recommended care.  Comment: Recommend eye patch or eye taping OD to protect sclera.    Rehab Admissions Coordinator to follow up.  Thanks,  [name], MD, Mellody Drown  I have personally performed a face to face  diagnostic evaluation of this patient. Additionally, I have reviewed and concur with the physician assistant's documentation above.    Bary Leriche, PA-C 04/18/2018    Revision History                             Routing History

## 2018-04-19 NOTE — Care Management Important Message (Signed)
Important Message  Patient Details  Name: Natalie Friedman MRN: [id number] Date of Birth: [birth year]   Medicare Important Message Given:  Yes    Mateus Rewerts Montine Circle 04/19/2018, 3:37 PM

## 2018-04-19 NOTE — Progress Notes (Signed)
Inpatient Rehabilitation Admissions Coordinator  I have insurance approval and bed to admit pt to inpt rehab today. I met with pt and her spouse at bedside and they are in agreement. I have notified Dr. Zada Finders, RN, RN CM and SW, I will make the arrangements to admit today.  Danne Baxter, RN, MSN Rehab Admissions Coordinator [phone] 04/19/2018 1:07 PM.

## 2018-04-19 NOTE — Progress Notes (Signed)
Pt ref to per [facility] tx at this time.  States she does not feel good.

## 2018-04-19 NOTE — Progress Notes (Addendum)
Initial Nutrition Assessment  DOCUMENTATION CODES:   Obesity unspecified  INTERVENTION:  Provide D[employer] [place] Standard 1.0 cal formula PO BID, each supplement provides 325 kcal and 16 grams of protein.   Family may bring in food from home/outside compliant of dysphagia diet consistency.  NUTRITION DIAGNOSIS:   Increased nutrient needs related to chronic illness(CHF) as evidenced by estimated needs  GOAL:   Patient will meet greater than or equal to 90% of their needs  MONITOR:   PO intake, Supplement acceptance, Labs, Weight trends, Skin, I & O's, Diet advancement  REASON FOR ASSESSMENT:   Consult Assessment of nutrition requirement/status  ASSESSMENT:   68 y.o. female with history of OA, CKD4, T2DM, 2-3 years history of ataxia with difficulty walking, double vision, hearing loss and recent falls due to imbalance. 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 Zada Finders.  Post with right facial palsy and dysarthria.   Pt currently follows a vegan and renal diet which she has been following for over the past 5 months. Pt reports consuming large amounts of non starchy vegetables, rice, beans, oats, oat milk, and Bragg liquid aminos. Pt avoids, dairy, meats, "nightshade" vegetables, sugar, fats/oils and processed foods. Meal completion has been 25%. Family has been bringing in food from outside/home. Pt agreeable to a vegan protein supplement to aid in adequate nutrition. RD to order.   Labs and medications reviewed.   NUTRITION - FOCUSED PHYSICAL EXAM:    Most Recent Value  Orbital Region  No depletion  Upper Arm Region  No depletion  Thoracic and Lumbar Region  No depletion  Buccal Region  No depletion  Temple Region  No depletion  Clavicle Bone Region  No depletion  Clavicle and Acromion Bone Region  No depletion  Scapular Bone Region  No depletion   Dorsal Hand  No depletion  Patellar Region  No depletion  Anterior Thigh Region  No depletion  Posterior Calf Region  No depletion  Edema (RD Assessment)  Mild  Hair  Reviewed  Eyes  Reviewed  Mouth  Reviewed  Skin  Reviewed  Nails  Reviewed       Diet Order:   Diet Order            DIET - DYS 1 Room service appropriate? Yes; Fluid consistency: Thin  Diet effective now            SLP recommended dysphagia 1 diet with thin liquids related to anticipated consequences of right CN VII LMN impairment, as well as decreased oral sensation (CN V) due likely to ongoing mass effect on pons per 1/8 MRI.  EDUCATION NEEDS:   Not appropriate for education at this time  Skin:  Skin Assessment: Skin Integrity Issues: Skin Integrity Issues:: Incisions Incisions: head R  Last BM:  1/9  Height:   Ht Readings from Last 1 Encounters:  04/16/18 5\' 6"  (1.676 m)    Weight:   Wt Readings from Last 1 Encounters:  04/16/18 104.8 kg    Ideal Body Weight:  59 kg  BMI:  37.42 kg/m^2  Estimated Nutritional Needs:   Kcal:  1850-2050  Protein:  90-105 grams  Fluid:  >/= 1.8 L/day    Corrin Parker, MS, RD, LDN Pager # [phone] After hours/ weekend pager # [phone]

## 2018-04-19 NOTE — Progress Notes (Addendum)
Neurosurgery Service Progress Note  Subjective: No acute events overnight, nausea / dizziness improved  Objective: Vitals:   04/18/18 1932 04/19/18 0000 04/19/18 0400 04/19/18 0816  BP: (!) 144/65 (!) 155/70 (!) 163/70 (!) 172/86  Pulse: 89 87 75 85  Resp: 16 (!) 23 18 15   Temp: 98.6 F (37 C) 98.6 F (37 C) 98.6 F (37 C) 98.3 F (36.8 C)  TempSrc: Oral Oral Oral Oral  SpO2: 96%   98%  Weight:      Height:       Temp (24hrs), Avg:98.5 F (36.9 C), Min:98.3 F (36.8 C), Max:98.6 F (37 C)  CBC Latest Ref Rng & Units 04/16/2018 04/16/2018 04/12/2018  WBC 4.0 - 10.5 K/uL 10.5 - 7.4  Hemoglobin 12.0 - 15.0 g/dL 10.5(L) 9.9(L) 12.4  Hematocrit 36.0 - 46.0 % 32.6(L) 29.0(L) 38.4  Platelets 150 - 400 K/uL 160 - 200   BMP Latest Ref Rng & Units 04/17/2018 04/16/2018 04/12/2018  Glucose 70 - 99 mg/dL 252(H) - 227(H)  BUN 8 - 23 mg/dL 32(H) - 32(H)  Creatinine 0.44 - 1.00 mg/dL 2.54(H) 2.23(H) 2.40(H)  Sodium 135 - 145 mmol/L 141 138 139  Potassium 3.5 - 5.1 mmol/L 4.2 4.4 4.2  Chloride 98 - 111 mmol/L 108 - 105  CO2 22 - 32 mmol/L 24 - 24  Calcium 8.9 - 10.3 mg/dL 9.2 - 9.5    Intake/Output Summary (Last 24 hours) at 04/19/2018 8366 Last data filed at 04/18/2018 1300 Gross per 24 hour  Intake -  Output 900 ml  Net -900 ml    Current Facility-Administered Medications:  .  acetaminophen (TYLENOL) tablet 650 mg, 650 mg, Oral, Q4H PRN **OR** acetaminophen (TYLENOL) suppository 650 mg, 650 mg, Rectal, Q4H PRN, Chaske Paskett A, MD .  amLODipine (NORVASC) tablet 5 mg, 5 mg, Oral, Q24H, Tristen Pennino, Joyice Faster, MD, 5 mg at 04/18/18 2108 .  cholecalciferol (VITAMIN D3) tablet 4,000 Units, 4,000 Units, Oral, Daily, Judith Part, MD, 4,000 Units at 04/17/18 1100 .  docusate sodium (COLACE) capsule 100 mg, 100 mg, Oral, BID, Jimmie Dattilio A, MD .  famotidine (PEPCID) IVPB 20 mg premix, 20 mg, Intravenous, Q24H, Keri Veale A, MD, Last Rate: 100 mL/hr at 04/18/18 1256, 20 mg at  04/18/18 1256 .  heparin injection 5,000 Units, 5,000 Units, Subcutaneous, Q8H, Judith Part, MD, 5,000 Units at 04/19/18 0640 .  HYDROcodone-acetaminophen (NORCO/VICODIN) 5-325 MG per tablet 1 tablet, 1 tablet, Oral, Q4H PRN, Tuwanna Krausz A, MD .  HYDROmorphone (DILAUDID) injection 0.5 mg, 0.5 mg, Intravenous, Q3H PRN, Judith Part, MD .  irbesartan (AVAPRO) tablet 300 mg, 300 mg, Oral, Daily, Legna Mausolf A, MD, 300 mg at 04/18/18 1105 .  labetalol (NORMODYNE,TRANDATE) injection 10-40 mg, 10-40 mg, Intravenous, Q10 min PRN, Judith Part, MD, 20 mg at 04/18/18 1842 .  meclizine (ANTIVERT) tablet 12.5 mg, 12.5 mg, Oral, TID PRN, Judith Part, MD, 12.5 mg at 04/18/18 1105 .  MEDLINE mouth rinse, 15 mL, Mouth Rinse, BID, Crissa Sowder, Joyice Faster, MD, 15 mL at 04/18/18 2110 .  ondansetron (ZOFRAN) tablet 4 mg, 4 mg, Oral, Q4H PRN **OR** ondansetron (ZOFRAN) injection 4 mg, 4 mg, Intravenous, Q4H PRN, Judith Part, MD, 4 mg at 04/17/18 2017 .  polyethylene glycol (MIRALAX / GLYCOLAX) packet 17 g, 17 g, Oral, Daily PRN, Laveyah Oriol A, MD .  polyvinyl alcohol (LIQUIFILM TEARS) 1.4 % ophthalmic solution 1 drop, 1 drop, Right Eye, QID, Mavis Gravelle, Joyice Faster, MD, 1 drop at  04/18/18 2122 .  promethazine (PHENERGAN) injection 12.5 mg, 12.5 mg, Intravenous, Q4H PRN, Judith Part, MD, 12.5 mg at 04/18/18 1337 .  promethazine (PHENERGAN) tablet 12.5-25 mg, 12.5-25 mg, Oral, Q4H PRN, Judith Part, MD .  tamsulosin (FLOMAX) capsule 0.4 mg, 0.4 mg, Oral, Daily, Everado Pillsbury A, MD, 0.4 mg at 04/18/18 1105 .  vitamin B-12 (CYANOCOBALAMIN) tablet 500 mcg, 500 mcg, Oral, Daily, Judith Part, MD, 500 mcg at 04/17/18 1101 .  zinc sulfate capsule 220 mg, 220 mg, Oral, Daily, Judith Part, MD, Stopped at 04/16/18 1824   Physical Exam: AOx3, PERRL, gaze neutral, +R HB grade 3 w/ complete eye closure, intact sensation on R, baseline R hearing loss,  strength 5/5x4  Assessment & Plan: 68 y.o. 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

## 2018-04-20 ENCOUNTER — Inpatient Hospital Stay (HOSPITAL_COMMUNITY): Payer: Medicare Other | Admitting: Physical Therapy

## 2018-04-20 ENCOUNTER — Inpatient Hospital Stay (HOSPITAL_COMMUNITY): Payer: Medicare Other | Admitting: Occupational Therapy

## 2018-04-20 ENCOUNTER — Inpatient Hospital Stay (HOSPITAL_COMMUNITY): Payer: Medicare Other

## 2018-04-20 DIAGNOSIS — Z9981 Dependence on supplemental oxygen: Secondary | ICD-10-CM

## 2018-04-20 DIAGNOSIS — E8809 Other disorders of plasma-protein metabolism, not elsewhere classified: Secondary | ICD-10-CM

## 2018-04-20 DIAGNOSIS — H832X1 Labyrinthine dysfunction, right ear: Secondary | ICD-10-CM

## 2018-04-20 DIAGNOSIS — T148XXA Other injury of unspecified body region, initial encounter: Secondary | ICD-10-CM

## 2018-04-20 DIAGNOSIS — D333 Benign neoplasm of cranial nerves: Secondary | ICD-10-CM | POA: Insufficient documentation

## 2018-04-20 DIAGNOSIS — D62 Acute posthemorrhagic anemia: Secondary | ICD-10-CM

## 2018-04-20 DIAGNOSIS — R8290 Unspecified abnormal findings in urine: Secondary | ICD-10-CM

## 2018-04-20 DIAGNOSIS — I10 Essential (primary) hypertension: Secondary | ICD-10-CM

## 2018-04-20 DIAGNOSIS — E46 Unspecified protein-calorie malnutrition: Secondary | ICD-10-CM

## 2018-04-20 DIAGNOSIS — G9389 Other specified disorders of brain: Secondary | ICD-10-CM

## 2018-04-20 DIAGNOSIS — E119 Type 2 diabetes mellitus without complications: Secondary | ICD-10-CM

## 2018-04-20 LAB — COMPREHENSIVE METABOLIC PANEL
ALT: 5 U/L (ref 0–44)
AST: 10 U/L — ABNORMAL LOW (ref 15–41)
Albumin: 2.7 g/dL — ABNORMAL LOW (ref 3.5–5.0)
Alkaline Phosphatase: 77 U/L (ref 38–126)
Anion gap: 10 (ref 5–15)
BUN: 26 mg/dL — ABNORMAL HIGH (ref 8–23)
CALCIUM: 9.7 mg/dL (ref 8.9–10.3)
CO2: 26 mmol/L (ref 22–32)
CREATININE: 2.13 mg/dL — AB (ref 0.44–1.00)
Chloride: 107 mmol/L (ref 98–111)
GFR calc Af Amer: 27 mL/min — ABNORMAL LOW (ref >60)
GFR, EST NON AFRICAN AMERICAN: 23 mL/min — AB (ref >60)
Glucose, Bld: 182 mg/dL — ABNORMAL HIGH (ref 70–99)
Potassium: 3.7 mmol/L (ref 3.5–5.1)
Sodium: 143 mmol/L (ref 135–145)
Total Bilirubin: 0.6 mg/dL (ref 0.3–1.2)
Total Protein: 6.2 g/dL — ABNORMAL LOW (ref 6.5–8.1)

## 2018-04-20 LAB — CBC WITH DIFFERENTIAL/PLATELET
Abs Immature Granulocytes: 0.04 10*3/uL (ref 0.00–0.07)
Basophils Absolute: 0 10*3/uL (ref 0.0–0.1)
Basophils Relative: 0 %
Eosinophils Absolute: 0 10*3/uL (ref 0.0–0.5)
Eosinophils Relative: 0 %
HCT: 31.5 % — ABNORMAL LOW (ref 36.0–46.0)
Hemoglobin: 10.2 g/dL — ABNORMAL LOW (ref 12.0–15.0)
Immature Granulocytes: 1 %
Lymphocytes Relative: 13 %
Lymphs Abs: 1 10*3/uL (ref 0.7–4.0)
MCH: 30.6 pg (ref 26.0–34.0)
MCHC: 32.4 g/dL (ref 30.0–36.0)
MCV: 94.6 fL (ref 80.0–100.0)
MONOS PCT: 9 %
Monocytes Absolute: 0.7 10*3/uL (ref 0.1–1.0)
Neutro Abs: 5.6 10*3/uL (ref 1.7–7.7)
Neutrophils Relative %: 77 %
Platelets: 165 10*3/uL (ref 150–400)
RBC: 3.33 MIL/uL — ABNORMAL LOW (ref 3.87–5.11)
RDW: 11.1 % — ABNORMAL LOW (ref 11.5–15.5)
WBC: 7.3 10*3/uL (ref 4.0–10.5)
nRBC: 0 % (ref 0.0–0.2)

## 2018-04-20 LAB — GLUCOSE, CAPILLARY
Glucose-Capillary: 190 mg/dL — ABNORMAL HIGH (ref 70–99)
Glucose-Capillary: 249 mg/dL — ABNORMAL HIGH (ref 70–99)
Glucose-Capillary: 170 mg/dL — ABNORMAL HIGH (ref 70–99)
Glucose-Capillary: 200 mg/dL — ABNORMAL HIGH (ref 70–99)

## 2018-04-20 LAB — URINALYSIS, ROUTINE W REFLEX MICROSCOPIC
Bilirubin Urine: NEGATIVE
Glucose, UA: >=500 mg/dL — AB
Ketones, ur: NEGATIVE mg/dL
Leukocytes, UA: NEGATIVE
NITRITE: NEGATIVE
PH: 5 (ref 5.0–8.0)
Protein, ur: 100 mg/dL — AB
Specific Gravity, Urine: 1.012 (ref 1.005–1.030)

## 2018-04-20 MED ORDER — BENZOCAINE 10 % MT GEL
Freq: Three times a day (TID) | OROMUCOSAL | Status: DC | PRN
  Administered 2018-04-20: 1 via OROMUCOSAL
  Filled 2018-04-20 (×2): qty 9

## 2018-04-20 MED ORDER — AMLODIPINE BESYLATE 5 MG PO TABS
5.0000 mg | ORAL_TABLET | Freq: Once | ORAL | Status: AC
  Administered 2018-04-20: 5 mg via ORAL
  Filled 2018-04-20: qty 1

## 2018-04-20 MED ORDER — AMLODIPINE BESYLATE 10 MG PO TABS
10.0000 mg | ORAL_TABLET | ORAL | Status: DC
  Administered 2018-04-20 – 2018-04-21 (×2): 10 mg via ORAL
  Filled 2018-04-20 (×2): qty 1

## 2018-04-20 NOTE — IPOC Note (Signed)
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 ([place])   Hypoalbuminemia due to protein-calorie malnutrition ([place])   Essential hypertension   Acute blood loss anemia   Vestibular dysfunction of right ear   Diabetes mellitus type 2 in nonobese (HCC)   Foul smelling urine   Blister   Supplemental oxygen dependent     Functional Problem List: Nursing Bladder, Bowel, Medication Management, Edema, Endurance, Nutrition, Perception, Safety, Sensory, Skin Integrity  PT Balance, Endurance, Motor, Perception  OT Balance, Cognition, Endurance, Motor, Perception, Sensory, Vision  SLP    TR         Basic ADL's: OT Eating, Grooming, Bathing, Dressing, Toileting     Advanced  ADL's: OT       Transfers: PT Bed Mobility, Bed to Chair, Teacher, early years/pre, Tub/Shower     Locomotion: PT Ambulation, Emergency planning/management officer, Stairs     Additional Impairments: OT Fuctional Use of Upper Extremity  SLP Swallowing, Communication expression    TR      Anticipated Outcomes Item Anticipated Outcome  Self Feeding Independent  Swallowing  Supervision A   Basic self-care  S  Toileting  S   Bathroom Transfers S  Bowel/Bladder  remain continent of bowel. Maintain regular pattern of voiding and emptying bowels.   Transfers  S transfers  Locomotion  S gait, c/g stairs, mod I w/c mobility  Communication  Supervision A  Cognition     Pain  less than 3  Safety/Judgment  Remain free of falls, infection, skin breakdowm   Therapy Plan: PT Intensity: Minimum of 1-2 x/day ,45 to 90 minutes PT Frequency: 5 out of 7 days PT Duration Estimated Length of Stay: 16 to 18 days OT Intensity: Minimum of 1-2 x/day, 45 to 90 minutes OT Frequency: 5 out of 7 days OT Duration/Estimated  Length of Stay: 16-18 days SLP Intensity: Minumum of 1-2 x/day, 30 to 90 minutes SLP Frequency: 3 to 5 out of 7 days SLP Duration/Estimated Length of Stay: 13-19 days     Team Interventions: Nursing Interventions Patient/Family Education, Pain Management, Bladder Management, Disease Management/Prevention, Skin Care/Wound Management, Dysphagia/Aspiration Precaution Training  PT interventions Ambulation/gait training, Training and development officer, Community reintegration, Discharge planning, DME/adaptive equipment instruction, Neuromuscular re-education, IT trainer, Therapeutic Activities, Therapeutic Exercise, UE/LE Coordination activities, UE/LE Strength taining/ROM, Wheelchair propulsion/positioning, Visual/perceptual remediation/compensation  OT Interventions Training and development officer, Cognitive remediation/compensation, Discharge planning, DME/adaptive equipment instruction, Functional mobility training, Neuromuscular re-education, Patient/family education, Self Care/advanced ADL retraining, Psychosocial support, Therapeutic Activities, Therapeutic Exercise, UE/LE Strength taining/ROM, UE/LE Coordination activities, Visual/perceptual remediation/compensation  SLP Interventions Cueing hierarchy, Dysphagia/aspiration precaution training, Patient/family education, Speech/Language facilitation  TR Interventions    SW/CM Interventions     Barriers to Discharge MD  Medical stability, Wound care and Weight  Nursing      PT Inaccessible home environment    OT      SLP      SW       Team Discharge Planning: Destination: PT-Home ,OT- Home , SLP-Home Projected Follow-up: PT-Home health PT, OT-  Home health OT, SLP-Home Health SLP, 24 hour supervision/assistance, Outpatient SLP Projected Equipment Needs: PT-To be determined, OT- Tub/shower seat, 3 in 1 bedside comode, SLP-None recommended by SLP Equipment Details: PT- , OT-  Patient/family involved in discharge planning: PT- Patient,   OT-Patient, Family member/caregiver, SLP-Patient, Family  member/caregiver  MD ELOS: 14-18 days. Medical Rehab Prognosis:  Good Assessment: Natalie Friedman is a 68 year old female with history of OA, CKD, T2DM, 2-3 years history of ataxia with difficulty walking progressing to hearing loss on the right, double vision and recent falls. She was found to have right CP mass and was admitted on 04/16/18 for right rectosigmoid craniotomy for resection of mass by Dr. Zada Finders. Post op with right facial palsy, dysarthria as well as dizziness with N/V. Blood pressures have been labile and she continues to requires oxygen due to hypoxia. BSS done and diet downgraded to dysphagia 1 due to oral phase dysphagia with sensorimotor deficits.  Therapy ongoing and patient continues to have limitations in mobility and ability to carry out ADLs.  We will set goals for Supervision with PT/OT/SLP.  See Team Conference Notes for weekly updates to the plan of care

## 2018-04-20 NOTE — Evaluation (Signed)
Physical Therapy Assessment and Plan  Patient Details  Name: Natalie Friedman MRN: [id number] Date of Birth: 07/26/1950  PT Diagnosis: Ataxia, Difficulty walking, Hemiparesis dominant, Impaired sensation and Muscle weakness Rehab Potential: Good ELOS: 16 to 18 days   Today's Date: 04/20/2018 PT Individual Time: 1300-1359 PT Individual Time Calculation (min): 59 min    Problem List:  Patient Active Problem List   Diagnosis Date Noted  . 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 Hypoalbuminemia due to protein-calorie malnutrition (HCC)   . Essential hypertension   . Acute blood loss anemia   . Vestibular dysfunction of right ear   . Diabetes mellitus type 2 in nonobese (HCC)   . Foul smelling urine   . Blister   . Supplemental oxygen dependent   . Brain mass 04/19/2018  . Vestibular schwannoma (HCC) 04/16/2018  . Brain tumor (HCC) 04/16/2018  . Cellulitis and abscess of right leg   . 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 Primary osteoarthritis of right knee   . Nephrotic syndrome 05/15/2016  . CHF (congestive heart failure) (HCC) 05/12/2016  . DM (diabetes mellitus), type 2 with renal complications (HCC) 05/12/2016  . Hypertensive urgency 05/12/2016  . anasarca 05/12/2016  . Immobility 05/12/2016  . Proteinuria 05/12/2016  . Uncontrolled hypertension 08/19/2015    Past Medical History:  Past Medical History:  Diagnosis Date  . Anxiety   . Arthritis   . CHF (congestive heart failure) (HCC)   . Chronic kidney disease   . Depression   . Diabetes mellitus without complication (HCC)   . Hypertension   . Neuropathy   . Occult spina bifida    Past Surgical History:  Past Surgical History:  Procedure Laterality Date  . APPLICATION OF CRANIAL NAVIGATION N/A 04/16/2018   Procedure: APPLICATION OF CRANIAL NAVIGATION;  Surgeon: Ostergard, Thomas A, MD;  Location: [facility] OR;  Service: Neurosurgery;  Laterality: N/A;  . CRANIOTOMY Right  04/16/2018   Procedure: Right craniotomy for tumor resection with brainlab;  Surgeon: Ostergard, Thomas A, MD;  Location: [facility] OR;  Service: Neurosurgery;  Laterality: Right;  right  . ectopic    . INCISION AND DRAINAGE Right 10/04/2016   Procedure: INCISION AND DRAINAGE AND ASPIRATION;  Surgeon: Harrison, Stanley E, MD;  Location: [facility] ORS;  Service: Orthopedics;  Laterality: Right;  right knee wound  . patella fx Right 07/2015  . TOTAL KNEE ARTHROPLASTY Right 07/20/2016   Procedure: TOTAL KNEE ARTHROPLASTY;  Surgeon: Stanley E Harrison, MD;  Location: [facility] ORS;  Service: Orthopedics;  Laterality: Right;    Assessment & Plan Clinical Impression: Patient is a 67 year old female with history of OA, CKD, T2DM, 2-3 years history of ataxia with difficulty walking progressing to hearing loss on the right, double vision and recent falls. She was found to have right CP mass and was admitted on 04/16/18 for right rectosigmoid craniotomy for resection of mass by Dr. Ostergard. Post op with right facial palsy, dysarthria as well as dizziness with N/V.Blood pressures have been labile and she continues to requires oxygen due to hypoxia. BSS done and diet downgraded to dysphagia 1 due to oral phase dysphagia with sensorimotor deficits.Therapy ongoing and patient continues to have limitations in mobility and ability to carry out ADLs. CIR recommended due to functional deficits  Patient transferred to CIR on 04/19/2018 .   Patient currently requires mod with mobility secondary to muscle weakness, decreased cardiorespiratoy endurance, ataxia, decreased attention to right and decreased problem   solving.  Prior to hospitalization, patient was modified independent  with mobility and lived with Spouse in a House home.  Home access is 2Stairs to enter.  Patient will benefit from skilled PT intervention to maximize safe functional mobility, minimize fall risk and decrease caregiver burden for planned discharge home with 24 hour  assist.  Anticipate patient will benefit from follow up HH at discharge.  PT - End of Session Activity Tolerance: Tolerates 30+ min activity with multiple rests Endurance Deficit: Yes PT Assessment Rehab Potential (ACUTE/IP ONLY): Good PT Barriers to Discharge: Inaccessible home environment PT Patient demonstrates impairments in the following area(s): Balance;Endurance;Motor;Perception PT Transfers Functional Problem(s): Bed Mobility;Bed to Chair;Car PT Locomotion Functional Problem(s): Ambulation;Wheelchair Mobility;Stairs PT Plan PT Intensity: Minimum of 1-2 x/day ,45 to 90 minutes PT Frequency: 5 out of 7 days PT Duration Estimated Length of Stay: 16 to 18 days PT Treatment/Interventions: Ambulation/gait training;Balance/vestibular training;Community reintegration;Discharge planning;DME/adaptive equipment instruction;Neuromuscular re-education;Stair training;Therapeutic Activities;Therapeutic Exercise;UE/LE Coordination activities;UE/LE Strength taining/ROM;Wheelchair propulsion/positioning;Visual/perceptual remediation/compensation PT Transfers Anticipated Outcome(s): S transfers PT Locomotion Anticipated Outcome(s): S gait, c/g stairs, mod I w/c mobility PT Recommendation Follow Up Recommendations: Home health PT Patient destination: Home Equipment Recommended: To be determined  Skilled Therapeutic Intervention PT evaluation completed and treatment plan initiated. Pt performed multiple sit to stand and stand pivot transfers with mod A and verbal cues. Pt reluctant to try stairs due to fear and weakness. Pt was able to perform step taps in parallel bars. Pt returned to room following treatment. Pt transferred w/c to edge of bed with mod A and verbal cues. Pt transferred edge of bed to supine with min A and verbal cues. Pt left sitting up in bed with call bell within reach and bed alarm on.   PT Evaluation Precautions/Restrictions Precautions Precautions: Fall Precaution Comments:  dizziness and nausea, h/o falls, double vision Other Brace: glasses with opaque occlusion on right lens of glasses Restrictions Weight Bearing Restrictions: No General Chart Reviewed: Yes Family/Caregiver Present: No Oxygen Therapy O2 Device: Nasal Cannula O2 Flow Rate (L/min): 1 L/min Pain Pain Assessment Pain Scale: 0-10 Pain Score: 2  Pain Location: Head Pain Descriptors / Indicators: Aching Home Living/Prior Functioning Home Living Available Help at Discharge: Family;Available 24 hours/day Type of Home: House Home Access: Stairs to enter Entrance Stairs-Number of Steps: 2 Entrance Stairs-Rails: None Home Layout: Two level;Able to live on main level with bedroom/bathroom Additional Comments: Pt sleeps in a regular bed on the first floor.  Lives With: Spouse Prior Function Level of Independence: Independent with basic ADLs;Independent with gait;Requires assistive device for independence  Able to Take Stairs?: Yes Driving: No Vocation: Retired Comments: ADLs and IADLs. Uses a cane for functional mobility. Retired and can drive but hasnt in three years. Vision/Perception  Perception Perception: Impaired Inattention/Neglect: Does not attend to right side of body  Cognition Overall Cognitive Status: Impaired/Different from baseline Arousal/Alertness: Awake/alert Orientation Level: Oriented X4 Attention: (fatigued ) Memory: Impaired(did not assess formally due to request by pt/family) Memory Impairment: Decreased short term memory Decreased Short Term Memory: Verbal complex;Functional complex Awareness: Appears intact Problem Solving: Impaired Safety/Judgment: Appears intact Sensation Sensation Light Touch: Impaired by gross assessment Proprioception: Impaired by gross assessment Additional Comments: R LE Coordination Gross Motor Movements are Fluid and Coordinated: No Fine Motor Movements are Fluid and Coordinated: No Motor  Motor Motor: Ataxia(hemiparesis)   Mobility Bed Mobility Bed Mobility: Rolling Left;Supine to Sit;Sit to Supine Rolling Left: Supervision/Verbal cueing Supine to Sit: Minimal Assistance - Patient > 75% Sit to Supine:   Minimal Assistance - Patient > 75% Transfers Transfers: Sit to Stand;Stand to Sit;Stand Pivot Transfers Sit to Stand: Moderate Assistance - Patient 50-74% Stand to Sit: Moderate Assistance - Patient 50-74% Stand Pivot Transfers: Moderate Assistance - Patient 50 - 74% Transfer (Assistive device): None Locomotion  Gait Ambulation: Yes Gait Assistance: Moderate Assistance - Patient 50-74% Gait Distance (Feet): 2 Feet Assistive device: Parallel bars Stairs / Additional Locomotion Stairs: Yes(not assessed secondary to pt's fear) Wheelchair Mobility Wheelchair Mobility: Yes Wheelchair Assistance: Minimal assistance - Patient >75% Wheelchair Propulsion: Both upper extremities Distance: 25  Trunk/Postural Assessment  Thoracic Assessment Thoracic Assessment: Within Functional Limits Lumbar Assessment Lumbar Assessment: Within Functional Limits Postural Control Postural Control: Deficits on evaluation  Balance Balance Balance Assessed: Yes Static Sitting Balance Static Sitting - Balance Support: Right upper extremity supported Static Sitting - Level of Assistance: 5: Stand by assistance Static Standing Balance Static Standing - Balance Support: During functional activity Static Standing - Level of Assistance: 3: Mod assist Dynamic Standing Balance Dynamic Standing - Balance Support: During functional activity Dynamic Standing - Level of Assistance: 2: Max assist;3: Mod assist Extremity Assessment B UEs as per OT evaluation.   RLE Assessment RLE Assessment: Exceptions to WFL Passive Range of Motion (PROM) Comments: decreased extension R knee, hx TKA General Strength Comments: grossly 2+/5 LLE Assessment LLE Assessment: Within Functional Limits    Refer to Care Plan for Long Term  Goals  Recommendations for other services: None   Discharge Criteria: Patient will be discharged from PT if patient refuses treatment 3 consecutive times without medical reason, if treatment goals not met, if there is a change in medical status, if patient makes no progress towards goals or if patient is discharged from hospital.  The above assessment, treatment plan, treatment alternatives and goals were discussed and mutually agreed upon: by patient  Mitchell, James G 04/20/2018, 4:06 PM  

## 2018-04-20 NOTE — Progress Notes (Addendum)
Patient's blood pressure remained in the 735'D-897'O systolic. Patient is asymptomatic. catapres given prn, rechecked BP manually and it has been > 478 systolic. MD notified and received order for norvasc 5mg  po x 1. Patient has also been non-compliant with her oxygen via nasal cannula. RN provided education. Performed in and out cath for volumes >350 and patient. Cathed patient for 900 cc.    1/11 7:30  Notified MD of patient's low grade fever and inability to void with cloudy urine output. UA order received.

## 2018-04-20 NOTE — Evaluation (Signed)
Occupational Therapy Assessment and Plan  Patient Details  Name: Natalie Friedman MRN: [id number] Date of Birth: 1950-05-08  OT Diagnosis: abnormal posture, cognitive deficits, disturbance of vision and hemiplegia affecting dominant side Rehab Potential: Rehab Potential (ACUTE ONLY): Good ELOS: 16-18 days   Today's Date: 04/20/2018 OT Individual Time: 4403-4742 OT Individual Time Calculation (min): 75 min     Problem List:  Patient Active Problem List   Diagnosis Date Noted  . Brain mass 04/19/2018  . 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 Brain tumor ([place]) 04/16/2018  . Cellulitis and abscess of right leg   . 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 Primary osteoarthritis of right knee   . Nephrotic syndrome 05/15/2016  . CHF (congestive heart failure) ([place]) 05/12/2016  . DM (diabetes mellitus), type 2 with renal complications ([place]) 59/56/3875  . Hypertensive urgency 05/12/2016  . anasarca 05/12/2016  . Immobility 05/12/2016  . Proteinuria 05/12/2016  . Uncontrolled hypertension 08/19/2015    Past Medical History:  Past Medical History:  Diagnosis Date  . Anxiety   . Arthritis   . CHF (congestive heart failure) ([place])   . Chronic kidney disease   . Depression   . Diabetes mellitus without complication ([place])   . Hypertension   . Neuropathy   . Occult spina bifida    Past Surgical History:  Past Surgical History:  Procedure Laterality Date  . APPLICATION OF CRANIAL NAVIGATION N/A 04/16/2018   Procedure: APPLICATION OF CRANIAL NAVIGATION;  Surgeon: Judith Part, MD;  Location: [facility];  Service: Neurosurgery;  Laterality: N/A;  . CRANIOTOMY Right 04/16/2018   Procedure: Right craniotomy for tumor resection with brainlab;  Surgeon: Judith Part, MD;  Location: [facility];  Service: Neurosurgery;  Laterality: Right;  right  . ectopic    . INCISION AND DRAINAGE Right 10/04/2016   Procedure: INCISION AND DRAINAGE AND ASPIRATION;  Surgeon: Carole Civil, MD;  Location: [facility];  Service: Orthopedics;  Laterality: Right;  right knee wound  . patella fx Right 07/2015  . TOTAL KNEE ARTHROPLASTY Right 07/20/2016   Procedure: TOTAL KNEE ARTHROPLASTY;  Surgeon: Carole Civil, MD;  Location: [facility];  Service: Orthopedics;  Laterality: Right;    Assessment & Plan Clinical Impression:Natalie Friedman is a 68 year old female with history of OA, CKD, T2DM, 2-3 years history of ataxia with difficulty walking progressing to hearing loss on the right, double vision and recent falls. She was found to have right CP mass and was admitted on 04/16/18 for right rectosigmoid craniotomy for resection of mass by Dr. Zada Finders. Post op with right facial palsy, dysarthria as well as dizziness with N/V. Blood pressures have been labile and she continues to requires oxygen due to hypoxia. BSS done and diet downgraded to dysphagia 1 due to oral phase dysphagia with sensorimotor deficits.  Therapy ongoing and patient continues to have limitations in mobility and ability to carry out ADLs. CIR recommended due to functional deficits.  Patient transferred to CIR on 04/19/2018 .    Patient currently requires mod with basic self-care skills secondary to muscle weakness, decreased cardiorespiratoy endurance and decreased oxygen support, decreased coordination, decreased visual acuity, decreased visual perceptual skills, decreased visual motor skills and field cut, decreased attention to right, decreased problem solving and decreased memory, central origin and decreased sitting balance, decreased standing balance, decreased postural control, hemiplegia and decreased balance strategies.  Prior to hospitalization, patient could ADLs independently.  Patient will benefit from skilled intervention to  increase independence with basic self-care skills prior to discharge home with care partner.  Anticipate patient will require 24 hour supervision and follow up home health.  OT - End  of Session Activity Tolerance: Tolerates 10 - 20 min activity with multiple rests Endurance Deficit: Yes Endurance Deficit Description: relies on 1 L of O2 for support OT Assessment Rehab Potential (ACUTE ONLY): Good OT Patient demonstrates impairments in the following area(s): Balance;Cognition;Endurance;Motor;Perception;Sensory;Vision OT Basic ADL's Functional Problem(s): Eating;Grooming;Bathing;Dressing;Toileting OT Transfers Functional Problem(s): Toilet;Tub/Shower OT Additional Impairment(s): Fuctional Use of Upper Extremity OT Plan OT Intensity: Minimum of 1-2 x/day, 45 to 90 minutes OT Frequency: 5 out of 7 days OT Duration/Estimated Length of Stay: 16-18 days OT Treatment/Interventions: Balance/vestibular training;Cognitive remediation/compensation;Discharge planning;DME/adaptive equipment instruction;Functional mobility training;Neuromuscular re-education;Patient/family education;Self Care/advanced ADL retraining;Psychosocial support;Therapeutic Activities;Therapeutic Exercise;UE/LE Strength taining/ROM;UE/LE Coordination activities;Visual/perceptual remediation/compensation OT Self Feeding Anticipated Outcome(s): Independent OT Basic Self-Care Anticipated Outcome(s): S OT Toileting Anticipated Outcome(s): S OT Bathroom Transfers Anticipated Outcome(s): S OT Recommendation Patient destination: Home Follow Up Recommendations: Home health OT Equipment Recommended: Tub/shower seat;3 in 1 bedside comode   Skilled Therapeutic Intervention Pt seen for initial evaluation and ADL training. Initial education with pt and spouse on role of OT, POC. Pt's spouse left and pt continued her breakfast.  Spouse returned towards end of session.  Pt described dizziness as 5/10 sitting in bed with HOB at 45 degrees therefore had pt work on B/d from bed level focusing on small movement patterns and visual fixation to prevent dizziness from increasing.  Pt positioned with HOB higher to complete breakfast  (quite a bit of spillage on R side of mouth) and for UB bathing. Pt can reach to wash her arm but her strength is so limited that she needs A to fully wash thoroughly.  For LB B/D pt used rolling and bridging using visual fixation when she rolled.  She was able to sit up to sit EOB with only min -mod A and did not have increase in dizziness.  She had a lean to R and needed min A initially to self correct.  Used L hand on bed rail for support.  She can wt shift forward but had difficulty pushing through legs to lift hips high.  Squat pivot transfer to wc max A. Time did not allow for toilet transfers but pt should do well with a stedy lift.  Pt resting in wc with spouse in the room with pt.   OT Evaluation Precautions/Restrictions  Precautions Precautions: Fall Precaution Comments: dizziness and nausea, h/o falls, double vision Other Brace: glasses with opaque occlusion on right lens of glasses     Pain Pain Assessment Pain Score: 2  Pain Type: Chronic pain Pain Location: Back Pain Descriptors / Indicators: Aching Pain Onset: On-going Pain Intervention(s): (pt stated that she is monitoring her own pain) Home Living/Prior Functioning Home Living Family/patient expects to be discharged to:: Private residence Living Arrangements: Spouse/significant other Available Help at Discharge: Family, Available 24 hours/day Type of Home: House Home Access: Stairs to enter [employer] of Steps: 2 Entrance Stairs-Rails: None Home Layout: Two level, Able to live on main level with bedroom/bathroom Bathroom Shower/Tub: Walk-in shower, Door Additional Comments: Pt sleeps in a regular bed on the first floor.  Lives With: Spouse Prior Function Level of Independence: Independent with basic ADLs, Independent with gait, Requires assistive device for independence  Able to Take Stairs?: No Driving: No Vocation: Retired Biomedical scientist: retired Psychologist, sport and exercise Comments: ADLs and IADLs. Uses a cane  for functional mobility.  Retired and can drive but hasnt in three years. ADL ADL Eating: Minimal assistance Grooming: Moderate assistance Upper Body Bathing: Minimal assistance Lower Body Bathing: Moderate assistance Upper Body Dressing: Minimal assistance Lower Body Dressing: Moderate assistance Vision Baseline Vision/History: Wears glasses Wears Glasses: At all times Patient Visual Report: Diplopia Vision Assessment?: Yes Eye Alignment: Impaired (comment) Ocular Range of Motion: Within Functional Limits Tracking/Visual Pursuits: Right eye does not track laterally;Decreased smoothness of eye movement to RIGHT superior field;Decreased smoothness of eye movement to RIGHT inferior field Visual Fields: Right visual field deficit Diplopia Assessment: Disappears with one eye closed Additional Comments: R eye nystagmus during horizontal tracking Perception  Perception: Impaired Inattention/Neglect: Does not attend to right side of body Praxis Praxis: Intact Cognition Arousal/Alertness: Awake/alert Orientation Level: Person;Place;Situation Person: Oriented Place: Oriented Situation: Oriented Year: 2020 Month: January Day of Week: Correct Memory: Impaired Memory Impairment: Decreased short term memory Decreased Short Term Memory: Verbal complex;Functional complex Immediate Memory Recall: Sock;Blue;Bed Memory Recall: Sock;Blue;Bed Memory Recall Sock: With Cue Memory Recall Blue: With Cue Memory Recall Bed: Without Cue Awareness: Appears intact Sensation Sensation Light Touch: Impaired by gross assessment(pt states that she drops her utensils bc she can not feel them,but can tell when she is being touched) Hot/Cold: Impaired by gross assessment Proprioception: Impaired by gross assessment Stereognosis: Impaired by gross assessment Additional Comments: RUE Coordination Gross Motor Movements are Fluid and Coordinated: No Fine Motor Movements are Fluid and Coordinated:  No Coordination and Movement Description: pt frequently drops her utensils or loses her grasp when holding a washcloth Finger Nose Finger Test: not tested due to diplopia Motor  Motor Motor: Hemiplegia Mobility    max A with squat pivot transfer to L to w/c, min A sidelying to sit, S rolling Trunk/Postural Assessment  Thoracic Assessment Thoracic Assessment: Within Functional Limits Lumbar Assessment Lumbar Assessment: Within Functional Limits Postural Control Postural Control: Deficits on evaluation Trunk Control: R lateral lean in sitting with increased wt on R hip  Balance Static Sitting Balance Static Sitting - Level of Assistance: 4: Min assist Dynamic Sitting Balance Dynamic Sitting - Level of Assistance: 3: Mod assist Static Standing Balance Static Standing - Level of Assistance: 2: Max assist Extremity/Trunk Assessment RUE Assessment Active Range of Motion (AROM) Comments: WFL General Strength Comments: 3+/5 LUE Assessment Active Range of Motion (AROM) Comments: WFL General Strength Comments: 4-/5     Refer to Care Plan for Long Term Goals  Recommendations for other services: Therapeutic Recreation  Other general activity engagement   Discharge Criteria: Patient will be discharged from OT if patient refuses treatment 3 consecutive times without medical reason, if treatment goals not met, if there is a change in medical status, if patient makes no progress towards goals or if patient is discharged from hospital.  The above assessment, treatment plan, treatment alternatives and goals were discussed and mutually agreed upon: by patient  [facility] 04/20/2018, 12:11 PM

## 2018-04-20 NOTE — Evaluation (Addendum)
Speech Language Pathology Assessment and Plan  Patient Details  Name: Natalie Friedman MRN: [id number] Date of Birth: 07/29/1950  SLP Diagnosis: Dysarthria;Dysphagia  Rehab Potential: Good ELOS: 13-19 days     Today's Date: 04/20/2018 SLP Individual Time: 6734-1937 SLP Individual Time Calculation (min): 59 min   Problem List:  Patient Active Problem List   Diagnosis Date Noted  . Brain mass 04/19/2018  . 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 Brain tumor ([place]) 04/16/2018  . Cellulitis and abscess of right leg   . 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 Primary osteoarthritis of right knee   . Nephrotic syndrome 05/15/2016  . CHF (congestive heart failure) ([place]) 05/12/2016  . DM (diabetes mellitus), type 2 with renal complications ([place]) 90/24/0973  . Hypertensive urgency 05/12/2016  . anasarca 05/12/2016  . Immobility 05/12/2016  . Proteinuria 05/12/2016  . Uncontrolled hypertension 08/19/2015   Past Medical History:  Past Medical History:  Diagnosis Date  . Anxiety   . Arthritis   . CHF (congestive heart failure) ([place])   . Chronic kidney disease   . Depression   . Diabetes mellitus without complication ([place])   . Hypertension   . Neuropathy   . Occult spina bifida    Past Surgical History:  Past Surgical History:  Procedure Laterality Date  . APPLICATION OF CRANIAL NAVIGATION N/A 04/16/2018   Procedure: APPLICATION OF CRANIAL NAVIGATION;  Surgeon: Judith Part, MD;  Location: [facility];  Service: Neurosurgery;  Laterality: N/A;  . CRANIOTOMY Right 04/16/2018   Procedure: Right craniotomy for tumor resection with brainlab;  Surgeon: Judith Part, MD;  Location: [facility];  Service: Neurosurgery;  Laterality: Right;  right  . ectopic    . INCISION AND DRAINAGE Right 10/04/2016   Procedure: INCISION AND DRAINAGE AND ASPIRATION;  Surgeon: Carole Civil, MD;  Location: [facility];  Service: Orthopedics;  Laterality: Right;  right knee wound  .  patella fx Right 07/2015  . TOTAL KNEE ARTHROPLASTY Right 07/20/2016   Procedure: TOTAL KNEE ARTHROPLASTY;  Surgeon: Carole Civil, MD;  Location: [facility];  Service: Orthopedics;  Laterality: Right;    Assessment / Plan / Recommendation Clinical Impression Natalie Friedman is a 68 year old female with history of OA, CKD, T2DM, 2-3 years history of ataxia with difficulty walking progressing to hearing loss on the right, double vision and recent falls. She was found to have right CP mass and was admitted on 04/16/18 for right rectosigmoid craniotomy for resection of mass by Dr. Zada Finders. Post op with right facial palsy, dysarthria as well as dizziness with N/V.Blood pressures have been labile and she continues to requires oxygen due to hypoxia. BSS done and diet downgraded to dysphagia 1 due to oral phase dysphagia with sensorimotor deficits.Therapy ongoing and patient continues to have limitations in mobility and ability to carry out ADLs. CIR recommended due to functional deficits.  Pt presents with primary oral dysphagia due to right vestibular schwannoma resection, impacting CN VII LMN and decreased oral sensation due to impairment of CN V, likely a result of ongoing mass effect on pons per 1/8 MRI. Pt continues to demonstrate right lingual and oral weakness/decreased sensitivity, with healing laceration on tongue, although bleeding by the end of session. Pt was unaware of oral residue and required mod A for oral care. Pt demonstrated right anterior spillage via cup of thin, however able to create suction from straw placed on left side and moderate assist with no overt s/s aspiration. Pt  consumed trials of dys 1 with appropriate oral clearance, dys 2 trials demonstrated min-mod oral residue and dys 3 trials were limited by fatigue/lingual pain.  SLP recommends continuing current diet dys 1 and thin via straw diet with full supervision and oral care during/after PO consumption, as well as use of  lingual/finger sweeps to right buccal cavity. Pt demonstrated 70% intelligibility at sentence level due to oral dsyarthria. Pt was not formally assessed for cognitive linguistic function, request by pt and pt's husband, to due fatigue (back to back sessions) and allowing more time to metabolize medication. Family states pt is greatly impacted by strength of medication. Pt and pt's husbands confirms pt appears to be at baseline for cognitive function, with slight delayed processing ("not as quick in response" per husband), but would like to await formal assessment if there are continued concerns. Pt would benefit from skilled ST services in order to maximize functional independence and reduce burden of care, likely requiring supervision at discharge with continued skilled ST services.   Skilled Therapeutic Interventions          Skilled ST services focused on speech skills. SLP facilitated administration bedside swallow and speech assessment and provided education of results. SLP and pt colaborated to set goals for speech and swallow needs during length of stay. SLP provided initial education of speech intelligibility strategies .All questions answered to satisfaction.  Pt was left in room with call bell within reach, husband present and chair alarm set. SLP reccomends to continue skilled services.   SLP Assessment  Patient will need skilled Speech Lanaguage Pathology Services during CIR admission    Recommendations  SLP Diet Recommendations: Thin;Dysphagia 1 (Puree) Liquid Administration via: Straw Medication Administration: Whole meds with puree Supervision: Full supervision/cueing for compensatory strategies Compensations: Lingual sweep for clearance of pocketing;Monitor for anterior loss;Slow rate;Minimize environmental distractions;Small sips/bites Postural Changes and/or Swallow Maneuvers: Seated upright 90 degrees Oral Care Recommendations: Other (Comment)(oral care during (swallow strategies)  and after PO) Patient destination: Home Follow up Recommendations: Home Health SLP;24 hour supervision/assistance;Outpatient SLP Equipment Recommended: None recommended by SLP    SLP Frequency 3 to 5 out of 7 days   SLP Duration  SLP Intensity  SLP Treatment/Interventions 13-19 days   Minumum of 1-2 x/day, 30 to 90 minutes  Cueing hierarchy;Dysphagia/aspiration precaution training;Patient/family education;Speech/Language facilitation    Pain Pain Assessment Pain Score: 0-No pain  Prior Functioning Cognitive/Linguistic Baseline: Within functional limits Type of Home: House  Lives With: Spouse Available Help at Discharge: Family;Available 24 hours/day Vocation: Retired  Industrial/product designer Term Goals: Week 1: SLP Short Term Goal 1 (Week 1): Pt will demonstrate speech intelligibility strategies at sentence level with 80% intelligibility given min A verbal cues. SLP Short Term Goal 2 (Week 1): Pt will consume dys 1 and thin liquid via straw with min overt s/s aspiration and min A verbal cues for swallow strategies.  SLP Short Term Goal 3 (Week 1): Pt will consume trials of dys 2 textured foods with min overt s/s aspiration and min A verbal cues for swallow strategies.   Refer to Care Plan for Long Term Goals  Recommendations for other services: None   Discharge Criteria: Patient will be discharged from SLP if patient refuses treatment 3 consecutive times without medical reason, if treatment goals not met, if there is a change in medical status, if patient makes no progress towards goals or if patient is discharged from hospital.  The above assessment, treatment plan, treatment alternatives and goals were discussed and mutually  agreed upon: by patient and by family  Kirubel Aja  [facility] 04/20/2018, 12:53 PM

## 2018-04-20 NOTE — Progress Notes (Signed)
[place] PHYSICAL MEDICINE & REHABILITATION PROGRESS NOTE  Subjective/Complaints: Patient seen laying in bed this morning.  She states she slept well overnight.  Overnight patient had issues with blood pressure, discussed with nursing overnight as well as this morning.  Patient also noncompliant with nasal cannula.  Later informed by nursing guarding blister on tongue.  Informed by nursing regarding cloudy urine.  ROS: Denies CP, shortness of breath, nausea, vomiting, diarrhea.  Objective: Vital Signs: Blood pressure (!) 156/92, pulse 96, temperature 99.1 F (37.3 C), temperature source Oral, resp. rate 16, height 5\' 6"  (1.676 m), weight 100.6 kg, SpO2 94 %. No results found. Recent Labs    04/20/18 0558  WBC 7.3  HGB 10.2*  HCT 31.5*  PLT 165   Recent Labs    04/20/18 0558  NA 143  K 3.7  CL 107  CO2 26  GLUCOSE 182*  BUN 26*  CREATININE 2.13*  CALCIUM 9.7    Physical Exam: BP (!) 156/92   Pulse 96   Temp 99.1 F (37.3 C) (Oral)   Resp 16   Ht 5\' 6"  (1.676 m)   Wt 100.6 kg   SpO2 94%   BMI 35.80 kg/m  Constitutional: No distress . Vital signs reviewed.  Obese HENT: Right craniotomy C/D/I Eyes: EOMI. No discharge. Cardiovascular: RRR. No JVD. Respiratory: CTA Bilaterally. Normal effort.  + [place] GI: BS +. Non-distended. Musc: No edema or tenderness in extremities. Neurological: She isalert and  Right facial weakness Dysarthria Motor: 4-/5 prox to distal throughout.   Skin: Skin iswarm.Crani incision CDI Psychiatric: She has a normal mood and affect. Herbehavior is normal.  Assessment/Plan: 1. 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  Physiatrist is providing close team supervision and 24 hour management of active medical problems listed below.  Physiatrist and rehab team continue to assess barriers to discharge/monitor patient  progress toward functional and medical goals  Care Tool:  Bathing    Body parts bathed by patient: Chest, Abdomen, Front perineal area, Right upper leg, Left upper leg, Face   Body parts bathed by helper: Buttocks, Right lower leg, Left lower leg, Left arm, Right arm     Bathing assist       Upper Body Dressing/Undressing Upper body dressing   What is the patient wearing?: Pull over shirt    Upper body assist Assist Level: Minimal Assistance - Patient > 75%    Lower Body Dressing/Undressing Lower body dressing      What is the patient wearing?: Pants, Incontinence brief     Lower body assist Assist for lower body dressing: Moderate Assistance - Patient 50 - 74%     Toileting Toileting    Toileting assist Assist for toileting: Supervision/Verbal cueing(gown no underwear)     Transfers Chair/bed transfer  Transfers assist     Chair/bed transfer assist level: Maximal Assistance - Patient 25 - 49%     Locomotion Ambulation   Ambulation assist              Walk 10 feet activity   Assist           Walk 50 feet activity   Assist           Walk 150 feet activity   Assist           Walk 10 feet on uneven surface  activity   Assist  Wheelchair     Assist               Wheelchair 50 feet with 2 turns activity    Assist            Wheelchair 150 feet activity     Assist            Medical Problem List and Plan: 1.Functional deficits and truncal/limb ataxiasecondary to vestibular schwannoma s/p crani/resection  Begin CIR  Notes reviewed- patient with history of ataxia, visual disturbance, and progressive hearing loss, images reviewed- right cerebellar mass, labs reviewed 2. DVT Prophylaxis/Anticoagulation: Pharmaceutical:Lovenox 3.Chronic back pain/Pain Management:Tylenol prn 4. Mood:LCSW to follow for evaluation and support. 5. Neuropsych: This patientiscapable of making  decisions on herown behalf. 6. Skin/Wound Care:Routine pressure relief measures. Monitor wound for healing. 7. Fluids/Electrolytes/Nutrition:Monitor I/O.  8. T2DM:Hgb A1c- 6.5.Monitor BS ac/hs. Use SSI for elevated BS--is on very limited diet.  Monitor with increased mobility 9. Acute on chronic renal failure: Baseline SCr- 2.4--->2.54.  Creatinine 2.13 on 1/11  Continue to monitor 10. Occult spina bifida with neuropathy: Managed with OTC supplements. 11. Leucocytosis: Monitor for signs of infection. 12. Acute blood loss anemia:  Hemoglobin 10.2 on 1/11  Continue to monitor 13. Vestibular dysfunction: N/V with dizzinessimproving. -treat supportively 14. HTN: BPstill labile---Monitor BP bid. Continue Avapro and Norvasc.  Monitor with increased mobility 15. Hypoxia: Encourage IS.   Wean oxygen to off. 16. Right CN VII palsy: -eye patch or taping to cover eye -eye lubricants 17.  Hypoalbuminemia  Supplement initiated on 1/11 18.  Cloudy and foul urine  UA ordered 19.  Oral blister  Oral gel ordered   LOS: 1 days A FACE TO FACE EVALUATION WAS PERFORMED  Ankit Lorie [facility]ix 04/20/2018, 12:59 PM

## 2018-04-21 ENCOUNTER — Inpatient Hospital Stay (HOSPITAL_COMMUNITY): Payer: Medicare Other | Admitting: Physical Therapy

## 2018-04-21 DIAGNOSIS — I169 Hypertensive crisis, unspecified: Secondary | ICD-10-CM

## 2018-04-21 LAB — GLUCOSE, CAPILLARY
GLUCOSE-CAPILLARY: 158 mg/dL — AB (ref 70–99)
Glucose-Capillary: 199 mg/dL — ABNORMAL HIGH (ref 70–99)
Glucose-Capillary: 177 mg/dL — ABNORMAL HIGH (ref 70–99)
Glucose-Capillary: 230 mg/dL — ABNORMAL HIGH (ref 70–99)

## 2018-04-21 MED ORDER — HYDRALAZINE HCL 25 MG PO TABS
25.0000 mg | ORAL_TABLET | Freq: Three times a day (TID) | ORAL | Status: DC
  Administered 2018-04-21 – 2018-04-22 (×4): 25 mg via ORAL
  Filled 2018-04-21 (×4): qty 1

## 2018-04-21 NOTE — Progress Notes (Signed)
Patient's blood pressure remains in the 160's-170's/90's.  MD notified with patient's Blood pressure. Will monitor.

## 2018-04-21 NOTE — Progress Notes (Signed)
Physical Therapy Session Note  Patient Details  Name: Natalie Friedman MRN: [id number] Date of Birth: 07/27/1950  Today's Date: 04/21/2018 PT Individual Time: 0915-1000 PT Individual Time Calculation (min): 45 min   Short Term Goals: Week 1:  PT Short Term Goal 1 (Week 1): Pt will increase bed mobility to c/g. PT Short Term Goal 2 (Week 1): Pt will increase transfers to min A.  PT Short Term Goal 3 (Week 1): Pt will ambulate with LRAD about 50 feet PT Short Term Goal 4 (Week 1): Pt will ascend/descend 4 stairs with B rails. PT Short Term Goal 5 (Week 1): Pt will propel w/c about 100 feet with S.   Skilled Therapeutic Interventions/Progress Updates:  Pt was seen bedside in the am. Pt transferred supine to edge of bed with side rail and min A with verbal cues. Increased lean to R while sitting on edge of bed. Pt transferred to stand with max A and verbal cues. Pt returned to supine with min A and verbal cues. Pt rolled R/L with side rails and S with increased time to assist with placing new brief. Pt transferred supine to edge of bed with side rail and min A with verbal cues. Pt transferred sit to stand with mod A and verbal cues. Improved balance at edge of bed noted second time. Pt transferred edge of bed to w/c with mod to max A and verbal cues. Pt transported to gym. In gym, treatment focused on sit to stand from w/c with rolling walker. Pt transferred sit to stand from w/c with walker and mod A with verbal cues. Pt returned to room and left sitting up in w/c with call bell within reach.    Therapy Documentation Precautions:  Precautions Precautions: Fall Precaution Comments: dizziness and nausea, h/o falls, double vision Other Brace: glasses with opaque occlusion on right lens of glasses Restrictions Weight Bearing Restrictions: No General:   Vital Signs: Therapy Vitals Pulse Rate: 79 BP: (!) 173/82 Oxygen Therapy O2 Flow Rate (L/min): 1 L/min Pain: Pain Assessment Pain Scale:  0-10 Pain Score: 2  Pain Type: Chronic pain Pain Location: Back Pain Orientation: Upper;Mid Pain Descriptors / Indicators: Aching Pain Onset: On-going Patients Stated Pain Goal: 2 Pain Intervention(s): Refused   Therapy/Group: Individual Therapy  Dub Amis 04/21/2018, 12:09 PM

## 2018-04-21 NOTE — Progress Notes (Signed)
Inpatient Rehabilitation  Patient information reviewed and entered into eRehab system by Aaren Krog M. Cledith Kamiya, M.A., CCC/SLP, PPS Coordinator.  Information including medical coding, functional ability and quality indicators will be reviewed and updated through discharge.    Per nursing patient was given "Data Collection Information Summary" for Patients in Inpatient Rehabilitation Facilities with attached "Privacy Act Statement-Health Care Records" upon admission.   

## 2018-04-21 NOTE — Progress Notes (Signed)
[facility] PHYSICAL MEDICINE & REHABILITATION PROGRESS NOTE  Subjective/Complaints: Patient seen laying in bed this AM.  She slept very well overnight.  She had a good first day of therapies.  BP elevated per nursing.   ROS: Denies CP, shortness of breath, nausea, vomiting, diarrhea.  Objective: Vital Signs: Blood pressure (!) 182/77, pulse 86, temperature 98.8 F (37.1 C), resp. rate 16, height 5\' 6"  (1.676 m), weight 100.6 kg, SpO2 96 %. No results found. Recent Labs    04/20/18 0558  WBC 7.3  HGB 10.2*  HCT 31.5*  PLT 165   Recent Labs    04/20/18 0558  NA 143  K 3.7  CL 107  CO2 26  GLUCOSE 182*  BUN 26*  CREATININE 2.13*  CALCIUM 9.7    Physical Exam: BP (!) 182/77 (BP Location: Right Arm)   Pulse 86   Temp 98.8 F (37.1 C)   Resp 16   Ht 5\' 6"  (1.676 m)   Wt 100.6 kg   SpO2 96%   BMI 35.80 kg/m  Constitutional: No distress . Vital signs reviewed.  Obese HENT: Right craniotomy C/D/I Eyes: EOMI. No discharge. Cardiovascular: RRR. No JVD. Respiratory: CTA bilaterally. Normal effort.  + [place] GI: BS +. Non-distended. Musc: No edema or tenderness in extremities. Neurological: Alert. Right facial weakness Dysarthria Motor: 4-/5 prox to distal throughout, stable.   Skin: Skin iswarm.Crani incision CDI Psychiatric: She has a normal mood and affect. Herbehavior is normal.  Assessment/Plan: 1. 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  Physiatrist is providing close team supervision and 24 hour management of active medical problems listed below.  Physiatrist and rehab team continue to assess barriers to discharge/monitor patient progress toward functional and medical goals  Care Tool:  Bathing    Body parts bathed by patient: Chest, Abdomen, Front perineal area, Right upper leg, Left upper leg, Face   Body parts bathed by helper:  Buttocks, Right lower leg, Left lower leg, Left arm, Right arm     Bathing assist       Upper Body Dressing/Undressing Upper body dressing   What is the patient wearing?: Pull over shirt    Upper body assist Assist Level: Minimal Assistance - Patient > 75%    Lower Body Dressing/Undressing Lower body dressing      What is the patient wearing?: Pants, Incontinence brief     Lower body assist Assist for lower body dressing: Dependent - Patient 0%     Toileting Toileting    Toileting assist Assist for toileting: Total Assistance - Patient < 25%(in and out cath)     Transfers Chair/bed transfer  Transfers assist     Chair/bed transfer assist level: Maximal Assistance - Patient 25 - 49%     Locomotion Ambulation   Ambulation assist      Assist level: Moderate Assistance - Patient 50 - 74% Assistive device: Parallel bars Max distance: 2   Walk 10 feet activity   Assist  Walk 10 feet activity did not occur: Safety/medical concerns        Walk 50 feet activity   Assist Walk 50 feet with 2 turns activity did not occur: Safety/medical concerns         Walk 150 feet activity   Assist Walk 150 feet activity did not occur: Safety/medical concerns         Walk 10 feet on uneven surface  activity  Assist Walk 10 feet on uneven surfaces activity did not occur: Safety/medical concerns         Wheelchair     Assist Will patient use wheelchair at discharge?: Yes Type of Wheelchair: Manual    Wheelchair assist level: Minimal Assistance - Patient > 75% Max wheelchair distance: 25    Wheelchair 50 feet with 2 turns activity    Assist    Wheelchair 50 feet with 2 turns activity did not occur: Safety/medical concerns       Wheelchair 150 feet activity     Assist Wheelchair 150 feet activity did not occur: Safety/medical concerns          Medical Problem List and Plan: 1.Functional deficits and truncal/limb  ataxiasecondary to vestibular schwannoma s/p crani/resection  Cont CIR 2. DVT Prophylaxis/Anticoagulation: Pharmaceutical:Lovenox 3.Chronic back pain/Pain Management:Tylenol prn 4. Mood:LCSW to follow for evaluation and support. 5. Neuropsych: This patientiscapable of making decisions on herown behalf. 6. Skin/Wound Care:Routine pressure relief measures. Monitor wound for healing. 7. Fluids/Electrolytes/Nutrition:Monitor I/O.  8. T2DM:Hgb A1c- 6.5.Monitor BS ac/hs. Use SSI for elevated BS--is on very limited diet.  Elevated, ?overall improving on 1/12  Monitor with increased mobility 9. Acute on chronic renal failure: Baseline SCr- 2.4--->2.54.  Creatinine 2.13 on 1/11  Continue to monitor 10. Occult spina bifida with neuropathy: Managed with OTC supplements. 11. Leucocytosis: Monitor for signs of infection. 12. Acute blood loss anemia:  Hemoglobin 10.2 on 1/11  Continue to monitor 13. Vestibular dysfunction: N/V with dizzinessimproving. -treat supportively 14. HTN: BPstill labile---Monitor BP bid. Continue Avapro and Norvasc.  Hypertensive crisis overnight  Hydralizine 25 TID started on 1/12  Monitor with increased mobility 15. Hypoxia: Encourage IS.   Wean oxygen to off. 16. Right CN VII palsy: -eye patch or taping to cover eye -eye lubricants 17.  Hypoalbuminemia  Supplement initiated on 1/11 18.  Cloudy and foul urine  UA equivocal  Cont to monitor 19.  Oral blister  Oral gel ordered   LOS: 2 days A FACE TO FACE EVALUATION WAS PERFORMED   Lorie Phenix 04/21/2018, 4:37 PM

## 2018-04-22 ENCOUNTER — Inpatient Hospital Stay (HOSPITAL_COMMUNITY): Payer: Medicare Other | Admitting: Occupational Therapy

## 2018-04-22 ENCOUNTER — Inpatient Hospital Stay (HOSPITAL_COMMUNITY): Payer: Medicare Other

## 2018-04-22 ENCOUNTER — Inpatient Hospital Stay (HOSPITAL_COMMUNITY): Payer: Medicare Other | Admitting: Speech Pathology

## 2018-04-22 ENCOUNTER — Inpatient Hospital Stay (HOSPITAL_COMMUNITY): Payer: Medicare Other | Admitting: Physical Therapy

## 2018-04-22 LAB — GLUCOSE, CAPILLARY
GLUCOSE-CAPILLARY: 191 mg/dL — AB (ref 70–99)
GLUCOSE-CAPILLARY: 181 mg/dL — AB (ref 70–99)
Glucose-Capillary: 204 mg/dL — ABNORMAL HIGH (ref 70–99)
Glucose-Capillary: 198 mg/dL — ABNORMAL HIGH (ref 70–99)
Glucose-Capillary: 206 mg/dL — ABNORMAL HIGH (ref 70–99)
Glucose-Capillary: 220 mg/dL — ABNORMAL HIGH (ref 70–99)

## 2018-04-22 MED ORDER — AMLODIPINE BESYLATE 10 MG PO TABS
10.0000 mg | ORAL_TABLET | Freq: Every day | ORAL | Status: DC
  Administered 2018-04-22 – 2018-05-08 (×13): 10 mg via ORAL
  Filled 2018-04-22 (×19): qty 1

## 2018-04-22 MED ORDER — FUROSEMIDE 40 MG PO TABS
80.0000 mg | ORAL_TABLET | Freq: Two times a day (BID) | ORAL | Status: DC
  Administered 2018-04-22 – 2018-05-06 (×28): 80 mg via ORAL
  Filled 2018-04-22 (×29): qty 2

## 2018-04-22 MED ORDER — IRBESARTAN 300 MG PO TABS
150.0000 mg | ORAL_TABLET | Freq: Two times a day (BID) | ORAL | Status: DC
  Administered 2018-04-22 – 2018-05-06 (×28): 150 mg via ORAL
  Filled 2018-04-22 (×28): qty 1

## 2018-04-22 MED ORDER — CARVEDILOL 12.5 MG PO TABS
12.5000 mg | ORAL_TABLET | Freq: Two times a day (BID) | ORAL | Status: DC
  Administered 2018-04-22 – 2018-04-26 (×9): 12.5 mg via ORAL
  Filled 2018-04-22 (×10): qty 1

## 2018-04-22 NOTE — Progress Notes (Signed)
PHYSICAL MEDICINE & REHABILITATION PROGRESS NOTE  Subjective/Complaints: Patient seen lying in bed this morning.  She states she slept well overnight.  ROS: Denies CP, shortness of breath, nausea, vomiting, diarrhea.  Objective: Vital Signs: Blood pressure (!) 183/89, pulse 88, temperature 99.7 F (37.6 C), temperature source Oral, resp. rate 18, height 5\' 6"  (1.676 m), weight 100.6 kg, SpO2 92 %. No results found. Recent Labs    04/20/18 0558  WBC 7.3  HGB 10.2*  HCT 31.5*  PLT 165   Recent Labs    04/20/18 0558  NA 143  K 3.7  CL 107  CO2 26  GLUCOSE 182*  BUN 26*  CREATININE 2.13*  CALCIUM 9.7    Physical Exam: BP (!) 183/89 (BP Location: Right Arm)   Pulse 88   Temp 99.7 F (37.6 C) (Oral)   Resp 18   Ht 5\' 6"  (1.676 m)   Wt 100.6 kg   SpO2 92%   BMI 35.80 kg/m  Constitutional: No distress . Vital signs reviewed.  Obese HENT: Right craniotomy C/D/I Eyes: EOMI. No discharge. Cardiovascular: RRR.  No JVD. Respiratory: CTA bilaterally.  Normal effort. GI: BS +. Non-distended. Musc: No edema or tenderness in extremities. Neurological: Alert Right facial weakness Dysarthria Motor: 4-/5 prox to distal throughout, left stronger than right.   Skin: Skin iswarm.Crani incision CDI Psychiatric: She has a normal mood and affect. Herbehavior is normal.  Assessment/Plan: 1. 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  Physiatrist is providing close team supervision and 24 hour management of active medical problems listed below.  Physiatrist and rehab team continue to assess barriers to discharge/monitor patient progress toward functional and medical goals  Care Tool:  Bathing    Body parts bathed by patient: Chest, Abdomen, Front perineal area, Right upper leg, Left upper leg, Face   Body parts bathed by helper: Buttocks,  Right lower leg, Left lower leg, Left arm, Right arm     Bathing assist       Upper Body Dressing/Undressing Upper body dressing   What is the patient wearing?: Hospital gown only    Upper body assist Assist Level: Minimal Assistance - Patient > 75%    Lower Body Dressing/Undressing Lower body dressing      What is the patient wearing?: Pants, Incontinence brief     Lower body assist Assist for lower body dressing: Dependent - Patient 0%     Toileting Toileting    Toileting assist Assist for toileting: Total Assistance - Patient < 25%(I&O cath)     Transfers Chair/bed transfer  Transfers assist     Chair/bed transfer assist level: Moderate Assistance - Patient 50 - 74%(Steady)     Locomotion Ambulation   Ambulation assist      Assist level: Moderate Assistance - Patient 50 - 74% Assistive device: Parallel bars Max distance: 2   Walk 10 feet activity   Assist  Walk 10 feet activity did not occur: Safety/medical concerns        Walk 50 feet activity   Assist Walk 50 feet with 2 turns activity did not occur: Safety/medical concerns         Walk 150 feet activity   Assist Walk 150 feet activity did not occur: Safety/medical concerns         Walk 10 feet on uneven surface  activity   Assist Walk 10 feet on uneven surfaces activity did  not occur: Safety/medical concerns         Wheelchair     Assist Will patient use wheelchair at discharge?: Yes Type of Wheelchair: Manual    Wheelchair assist level: Minimal Assistance - Patient > 75% Max wheelchair distance: 25    Wheelchair 50 feet with 2 turns activity    Assist    Wheelchair 50 feet with 2 turns activity did not occur: Safety/medical concerns       Wheelchair 150 feet activity     Assist Wheelchair 150 feet activity did not occur: Safety/medical concerns          Medical Problem List and Plan: 1.Functional deficits and truncal/limb  ataxiasecondary to vestibular schwannoma s/p crani/resection  Cont CIR 2. DVT Prophylaxis/Anticoagulation: Pharmaceutical:Lovenox 3.Chronic back pain/Pain Management:Tylenol prn 4. Mood:LCSW to follow for evaluation and support. 5. Neuropsych: This patientiscapable of making decisions on herown behalf. 6. Skin/Wound Care:Routine pressure relief measures. Monitor wound for healing. 7. Fluids/Electrolytes/Nutrition:Monitor I/O.  8. T2DM:Hgb A1c- 6.5.Monitor BS ac/hs. Use SSI for elevated BS--is on very limited diet.  Elevated, ?overall improving on 1/12  Monitor with increased mobility 9. Acute on chronic renal failure: Baseline SCr- 2.4--->2.54.  Creatinine 2.13 on 1/11  Continue to monitor 10. Occult spina bifida with neuropathy: Managed with OTC supplements. 11. Leucocytosis: Monitor for signs of infection. 12. Acute blood loss anemia:  Hemoglobin 10.2 on 1/11  Continue to monitor 13. Vestibular dysfunction: N/V with dizzinessimproving. -treat supportively 14. HTN: BPstill labile---Monitor BP bid. Continue Avapro and Norvasc.  Hydralizine 25 TID started on 1/12  Remains elevated with hypertensive crisis, will discuss with nephro  Monitor with increased mobility 15. Hypoxia: Encourage IS.   Weaned oxygen to off. 16. Right CN VII palsy: Improving -eye lubricants 17.  Hypoalbuminemia  Supplement initiated on 1/11 18.  Cloudy and foul urine  UA equivocal  Cont to monitor 19.  Oral blister  Oral gel ordered   LOS: 3 days A FACE TO FACE EVALUATION WAS PERFORMED  Ankit Lorie Phenix 04/22/2018, 8:37 AM

## 2018-04-22 NOTE — Progress Notes (Signed)
Occupational Therapy Session Note  Patient Details  Name: Natalie Friedman MRN: [id number] Date of Birth: July 17, 1950  Today's Date: 04/22/2018 OT Individual Time: 0071-2197 OT Individual Time Calculation (min): 56 min    Short Term Goals: Week 1:  OT Short Term Goal 1 (Week 1): Pt will demonstrate improved sitting balance to bathe UB sitting EOB with S. OT Short Term Goal 2 (Week 1): Pt will demonstrate improved UE strength to be able to wash UB adequately with S. OT Short Term Goal 3 (Week 1): Pt will sit to stand to prepare for LB dressing with min - mod A. OT Short Term Goal 4 (Week 1): Pt will completed toilet transfer with mod A. OT Short Term Goal 5 (Week 1): Pt will demonstrate improved R hand coordination with being able to hold a utensil fully during eating.  Skilled Therapeutic Interventions/Progress Updates:    Pt completed bathing and dressing sit to stand at the sink.  Min assist for UB bathing in unsupported sitting.  Pt with increased lean to the right in sitting requiring min assist to correct.  She needed mod assist for LB bathing sit to stand.  Pt with increased dizziness and nausea throughout session, rated at 5/10 on 10 point scale.  She became most nauseas when bending down to to remove her gripper socks and when washing attempting to wash her lower legs.  Therapist educated her on the need to attempt bringing her LEs up one at a time and crossing them over the opposite knee instead of bending down.  Pt with no clean clothing so donned hospital gown.  She was able to cross her LEs and donn her gripper socks with min assist.  She completed brushing her hair with supervision from the wheelchair, using primarily the [place].  Finished session with stand pivot transfer back to the bed with mod assist in order to rest and for nursing to complete cath.  Decreased weight shift off of the RLE with stepping, resulting in short step length on the right as well as slight knee buckling.  Finished  session with call button and phone in reach and bed alarm in place.    Therapy Documentation Precautions:  Precautions Precautions: Fall Precaution Comments: dizziness and nausea, h/o falls, double vision Other Brace: glasses with opaque occlusion on right lens of glasses Restrictions Weight Bearing Restrictions: No  Pain: Pain Assessment Pain Score: 0-No pain ADL: See Care Tool Section for some detail of ADL   Other Treatments:     Therapy/Group: Individual Therapy  Shaquella Stamant OTR/L 04/22/2018, 12:50 PM

## 2018-04-22 NOTE — Progress Notes (Signed)
Occupational Therapy Session Note  Patient Details  Name: Quanda Pavlicek MRN: [id number] Date of Birth: 1951-03-08  Today's Date: 04/22/2018 OT Individual Time: 8527-7824 OT Individual Time Calculation (min): 43 min    Short Term Goals: Week 1:  OT Short Term Goal 1 (Week 1): Pt will demonstrate improved sitting balance to bathe UB sitting EOB with S. OT Short Term Goal 2 (Week 1): Pt will demonstrate improved UE strength to be able to wash UB adequately with S. OT Short Term Goal 3 (Week 1): Pt will sit to stand to prepare for LB dressing with min - mod A. OT Short Term Goal 4 (Week 1): Pt will completed toilet transfer with mod A. OT Short Term Goal 5 (Week 1): Pt will demonstrate improved R hand coordination with being able to hold a utensil fully during eating.  Skilled Therapeutic Interventions/Progress Updates:    Pt transferred from supine to sit with min assist to start session.  Pt still reporting dizziness at 5/10 in the bed but could not give me an accurate answer when sitting up.  She demonstrated increased lean and LOB to the right in sitting.  Pt initially needing mod assist to maintain static sitting balance.  After a few mins of sitting this progressed to min assist and then to supervision if the RUE was used to stabilize her balance. Had her work on functional reaching to the bedside table with the RUE and LUE for further work on dynamic sitting balance.  She was able to demonstrate reach with min guard assist with the left hand and occasional min assist for the right secondary to right side lean.  Next, had pt work on sit to stand and standing balance X 3 with use of the RW for support.  She needed mod assist for sit to stand as well as for maintaining balance.  She demonstrated increased trunk flexion in standing with decreased ability to stay standing longer than 30-45 seconds.  During and at end of session nursing came in to check BP which was 186/102 with one reading.  Pt placed  back in supine to rest at end of session secondary to elevated BP.  Call button and phone in reach as well as bed alarm in place.     Therapy Documentation Precautions:  Precautions Precautions: Fall Precaution Comments: dizziness and nausea, h/o falls, double vision Other Brace: glasses with opaque occlusion on right lens of glasses Restrictions Weight Bearing Restrictions: No  Vital Signs: Therapy Vitals Temp: 99.5 F (37.5 C) Temp Source: Oral Pulse Rate: 96 Resp: 18 BP: (!) 186/102(PA aware, scheduled hydralazine given, will recheck) Patient Position (if appropriate): Lying Oxygen Therapy SpO2: (!) 88 % O2 Device: Room Air Pain: Pain Assessment Pain Score: 0-No pain ADL: ADL Eating: Minimal assistance Grooming: Moderate assistance Upper Body Bathing: Minimal assistance Lower Body Bathing: Moderate assistance Upper Body Dressing: Minimal assistance Lower Body Dressing: Moderate assistance   Therapy/Group: Individual Therapy  Taeko Schaffer OTR/L 04/22/2018, 4:11 PM

## 2018-04-22 NOTE — Progress Notes (Signed)
Social Work  Social Work Assessment and Plan  Patient Details  Name: Natalie Friedman MRN: [id number] Date of Birth: 1950/11/17  Today's Date: 04/22/2018  Problem List:  Patient Active Problem List   Diagnosis Date Noted  . Diabetes mellitus type 2 in obese ([place])   . Dysphagia   . CKD (chronic kidney disease)   . Hypokalemia   . Hypertensive crisis   . 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 Hypoalbuminemia due to protein-calorie malnutrition ([place])   . Essential hypertension   . Acute blood loss anemia   . Vestibular dysfunction of right ear   . Diabetes mellitus type 2 in nonobese ([facility])   . Foul smelling urine   . Blister   . Supplemental oxygen dependent   . Brain mass 04/19/2018  . 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 Brain tumor ([place]) 04/16/2018  . Cellulitis and abscess of right leg   . 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 Primary osteoarthritis of right knee   . Nephrotic syndrome 05/15/2016  . CHF (congestive heart failure) ([place]) 05/12/2016  . DM (diabetes mellitus), type 2 with renal complications ([place]) 46/56/8127  . Hypertensive urgency 05/12/2016  . anasarca 05/12/2016  . Immobility 05/12/2016  . Proteinuria 05/12/2016  . Uncontrolled hypertension 08/19/2015   Past Medical History:  Past Medical History:  Diagnosis Date  . Anxiety   . Arthritis   . CHF (congestive heart failure) ([place])   . Chronic kidney disease   . Depression   . Diabetes mellitus without complication ([place])   . Hypertension   . Neuropathy   . Occult spina bifida    Past Surgical History:  Past Surgical History:  Procedure Laterality Date  . APPLICATION OF CRANIAL NAVIGATION N/A 04/16/2018   Procedure: APPLICATION OF CRANIAL NAVIGATION;  Surgeon: Judith Part, MD;  Location: [facility];  Service: Neurosurgery;  Laterality: N/A;  . CRANIOTOMY Right 04/16/2018   Procedure: Right craniotomy for tumor resection with brainlab;  Surgeon:  Judith Part, MD;  Location: [facility];  Service: Neurosurgery;  Laterality: Right;  right  . ectopic    . INCISION AND DRAINAGE Right 10/04/2016   Procedure: INCISION AND DRAINAGE AND ASPIRATION;  Surgeon: Carole Civil, MD;  Location: [facility];  Service: Orthopedics;  Laterality: Right;  right knee wound  . patella fx Right 07/2015  . TOTAL KNEE ARTHROPLASTY Right 07/20/2016   Procedure: TOTAL KNEE ARTHROPLASTY;  Surgeon: Carole Civil, MD;  Location: [facility];  Service: Orthopedics;  Laterality: Right;   Social History:  reports that she has never smoked. She has never used smokeless tobacco. She reports that she does not drink alcohol or use drugs.  Family / Support Systems Marital Status: Married Patient Roles: Spouse, Parent Spouse/Significant Other: spouse, Natalie Friedman @ (H) [phone] or (C) [phone] Children: son, Natalie Friedman Linna Hoff) @ (C) [phone] and another son living in [place] Anticipated Caregiver: spouse Ability/Limitations of Caregiver: no limitations Caregiver Availability: 24/7 Family Dynamics: Pt describes spouse as supportive and able to provide 24/7 assistance.  Social History Preferred language: English Religion: None Cultural Background: NA Read: Yes Write: Yes Employment Status: Retired Date Retired/Disabled/Unemployed: 12/2017 Legal History/Current Legal Issues: None Guardian/Conservator: None - per MD, pt is capable of making decisions on her own behalf.   Abuse/Neglect Abuse/Neglect Assessment Can Be Completed: Yes Physical Abuse: Denies Verbal Abuse: Denies Sexual Abuse: Denies Exploitation of patient/patient's resources: Denies Self-Neglect: Denies  Emotional Status Pt's affect, behavior and  adjustment status: Pt lying in bed and able to complete assessment interview without much difficulty, however, she is fatigued and her speech is slurred at times.  She denies any significant emotional distress.  Will monitor throughout  CIR stay and refer for neuropsychology as indicated. Recent Psychosocial Issues: declining mobility past few months Psychiatric History: Pt denies any psych h/o, however, chart indicates depression and anxiety. Substance Abuse History: NA  Patient / Family Perceptions, Expectations & Goals Pt/Family understanding of illness & functional limitations: Pt and spouse with good, basic understanding of her medical issues, surgery performed and current functional limitations/ need for CIR. Premorbid pt/family roles/activities: Pt was relatively independent, however, does not drive. Anticipated changes in roles/activities/participation: Per team goals of overall supervision, spouse will need to provide 24/7 caregiver support role. Pt/family expectations/goals: "I want to get home."  [employer]: None Premorbid Home Care/DME Agencies: None Transportation available at discharge: yes Resource referrals recommended: Neuropsychology, Support group (specify)  Discharge Planning Living Arrangements: Spouse/significant other Support Systems: Spouse/significant other, Children, Friends/neighbors Type of Residence: Private residence Insurance Resources: [employer], [employer] (specify county) Pensions consultant: Fish farm manager, [place] Referred: No Living Expenses: Own Money Management: Spouse Does the patient have any problems obtaining your medications?: No Home Management: mostly spouse managing the home Patient/Family Preliminary Plans: Pt to d/c home with spouse providing 24/7 support. Social Work Anticipated Follow Up Needs: HH/OP Expected length of stay: 16-18 days  Clinical Impression Elderly patient on CIR following surgery to remove schwannoma and now with significant deficits with mobility and speech.  Spouse is supportive and able to provide 24/7 assistance.  Pt denies any significant emotional distress, however, will monitor as she may benefit from  neuropsych support.  Will follow for d/c planning and support needs.  Gerome Kokesh 04/22/2018, 3:56 PM

## 2018-04-22 NOTE — Progress Notes (Signed)
Physical Therapy Session Note  Patient Details  Name: Natalie Friedman MRN: [id number] Date of Birth: [birth year]  Today's Date: 04/22/2018 PT Individual Time: 0835-0905 PT Individual Time Calculation (min): 30 min   Short Term Goals: Week 1:  PT Short Term Goal 1 (Week 1): Pt will increase bed mobility to c/g. PT Short Term Goal 2 (Week 1): Pt will increase transfers to min A.  PT Short Term Goal 3 (Week 1): Pt will ambulate with LRAD about 50 feet PT Short Term Goal 4 (Week 1): Pt will ascend/descend 4 stairs with B rails. PT Short Term Goal 5 (Week 1): Pt will propel w/c about 100 feet with S.   Skilled Therapeutic Interventions/Progress Updates:   Pt dozing in bed. She rated dizziness low while in supine.  PT oriented pt to schedule for the day; she is unable to read it due to diplopia.  Supine with HOB 30 degrees: neuromuscular re-education via multimodal cues and assistance PRN for : 10 x 1 each L/R and bil scapular protraction (punches) R/L straight leg raises; 15 x 1 bil hip internal rotation, 20 x 2 alternating ankle pumps.  R/L hip gentle PROM.  10 x 1 bil bridging. PT encouraged pt to count reps aloud to avoid Valsalva maneuver.    Pt closed eyes and dozed intermittently during session.    Pt left resting with Deidre Ala, RN entering room to give meds. PT informed her that bed alarm was not set yet.    Therapy Documentation Precautions:  Precautions Precautions: Fall Precaution Comments: dizziness and nausea, h/o falls, double vision Other Brace: glasses with opaque occlusion on right lens of glasses Restrictions Weight Bearing Restrictions: No Pain: 1/10 low back, premedicated      Therapy/Group: Individual Therapy  Tyliah Schlereth 04/22/2018, 12:07 PM

## 2018-04-22 NOTE — Progress Notes (Signed)
Physical Therapy Session Note  Patient Details  Name: Natalie Friedman MRN: [id number] Date of Birth: 06/20/1950  Today's Date: 04/22/2018 PT Individual Time: 1000-1055 PT Individual Time Calculation (min): 55 min   Short Term Goals: Week 1:  PT Short Term Goal 1 (Week 1): Pt will increase bed mobility to c/g. PT Short Term Goal 2 (Week 1): Pt will increase transfers to min A.  PT Short Term Goal 3 (Week 1): Pt will ambulate with LRAD about 50 feet PT Short Term Goal 4 (Week 1): Pt will ascend/descend 4 stairs with B rails. PT Short Term Goal 5 (Week 1): Pt will propel w/c about 100 feet with S.   Skilled Therapeutic Interventions/Progress Updates: Pt presented in bed lethargic and agreeable to therapy with encouragement. Pt c/o HA, dizziness and nausea at beginning of session but unable to rate. Per pt dizziness and nausea less than yesterday. Pt with x 1 episode of emesis during bed mobility. PTA threaded pants in supine total A for time management. Performed supine to sit modA with heavy use of bed features and required modA to maintain sitting balance due to increased lateral sway upon sitting. Pt noted to use bed rail for support while in sitting. Attempted STS with RW to assist with clothing management however pt unable to clear hips this session off bed. Pt was able to perform STS with use of [place] and bed elevated with modA, total A to complete clothing management. Pt then transferred to w/c with remaining session focused on w/c propulsion. Pt initially propelled in hallway minA x 24ft with moderate cues for increased elbow flexion/shoulder extension for more efficient propulsion. Pt noted to propel several feet then stop stop requiring cues for attention to task and lethargy noted. PTA applied theraband to R wheel rim for tactile reinforcement and pt propelled an additional 100ft with improved technqiue. Pt continued to require cues for sustained task and lethargic during activity. Pt transported  back to room at end of session and remained in w/c with belt alarm placed, call bell within reach and NT present.       Therapy Documentation Precautions:  Precautions Precautions: Fall Precaution Comments: dizziness and nausea, h/o falls, double vision Other Brace: glasses with opaque occlusion on right lens of glasses Restrictions Weight Bearing Restrictions: No   Therapy/Group: Individual Therapy  Aaryanna Hyden  Ahmari Garton, PTA  04/22/2018, 12:13 PM

## 2018-04-22 NOTE — Progress Notes (Signed)
Speech Language Pathology Daily Session Note  Patient Details  Name: Natalie Friedman MRN: [id number] Date of Birth: Aug 27, 1950  Today's Date: 04/22/2018 SLP Individual Time: 0930-1000 SLP Individual Time Calculation (min): 30 min  Short Term Goals: Week 1: SLP Short Term Goal 1 (Week 1): Pt will demonstrate speech intelligibility strategies at sentence level with 80% intelligibility given min A verbal cues. SLP Short Term Goal 2 (Week 1): Pt will consume dys 1 and thin liquid via straw with min overt s/s aspiration and min A verbal cues for swallow strategies.  SLP Short Term Goal 3 (Week 1): Pt will consume trials of dys 2 textured foods with min overt s/s aspiration and min A verbal cues for swallow strategies.   Skilled Therapeutic Interventions:  Skilled treatment sesison focused on communication goals. SLP received pt in bed positioned sidelaying for comfort. Positioning was not appropriate for PO intake. SLP facilitated session by reviewing handout (posted on wall) - SLOP - for speech intelligibility. Pt required Mod A cues for use to achieve ~ 50% intelligibility at the phrase level. Pt demonstrated overall fatigue throughout session and this was likely a factor in achieving speech intelligibility. Pt was left asleep in bed, bed alarm on and all needs within reach. Continue per current plan of care.       Pain    Therapy/Group: Individual Therapy  Lenise Jr 04/22/2018, 12:36 PM

## 2018-04-22 NOTE — Consult Note (Signed)
Reason for Consult:CKD 3, Sever HTN Referring Physician: Dr. Posey Pronto  HPI: 68 yr female admitted with R sided brain tumor , resected on 1/7. Now on rehab.  She is groggy and cannot give accurate hx.  Knows HTN >20 yr , had AKI in past, and then 2 yr ago say Dr. Hinda Lenis in [place] in hospital with CKD and vol xs. Responded to diuretics.  Cr in past 2 yr has ave 2.5-2.8.  Cr her 2.1-2.6.  No known FH renal dz, denies NSAIDs, or OTCs, herbals. Does not know name of home meds. Review of systems not obtained due to patient factors.    Past Medical History:  Diagnosis Date  . Anxiety   . Arthritis   . CHF (congestive heart failure) ([place])   . Chronic kidney disease   . Depression   . Diabetes mellitus without complication ([place])   . Hypertension   . Neuropathy   . Occult spina bifida     Past Surgical History:  Procedure Laterality Date  . APPLICATION OF CRANIAL NAVIGATION N/A 04/16/2018   Procedure: APPLICATION OF CRANIAL NAVIGATION;  Surgeon: Judith Part, MD;  Location: [facility];  Service: Neurosurgery;  Laterality: N/A;  . CRANIOTOMY Right 04/16/2018   Procedure: Right craniotomy for tumor resection with brainlab;  Surgeon: Judith Part, MD;  Location: [facility];  Service: Neurosurgery;  Laterality: Right;  right  . ectopic    . INCISION AND DRAINAGE Right 10/04/2016   Procedure: INCISION AND DRAINAGE AND ASPIRATION;  Surgeon: Carole Civil, MD;  Location: [facility];  Service: Orthopedics;  Laterality: Right;  right knee wound  . patella fx Right 07/2015  . TOTAL KNEE ARTHROPLASTY Right 07/20/2016   Procedure: TOTAL KNEE ARTHROPLASTY;  Surgeon: Carole Civil, MD;  Location: [facility];  Service: Orthopedics;  Laterality: Right;    Family History  Problem Relation Age of Onset  . Cancer Mother        colon  . Cancer - Colon Father     Social History:  reports that she has never smoked. She has never used smokeless tobacco. She reports that she does not drink alcohol or use  drugs.  Allergies:  Allergies  Allergen Reactions  . Corticosteroids Other (See Comments)    Hallucinations/swelling in legs & feet/passed out (ORAL ROUTE ONLY PER PATIENT)   . Gluten Meal Other (See Comments)    Over time increases inflammation in body  . Oxycodone Other (See Comments)    TOO STRONG, RESPIRATORY DEPRESSION    Medications:  I have reviewed the patient's current medications. Prior to Admission:  Medications Prior to Admission  Medication Sig Dispense Refill Last Dose  . Alpha-Lipoic Acid 200 MG TABS Take 400 mg by mouth daily.    Past Week at Unknown time  . amLODipine (NORVASC) 5 MG tablet Take 5 mg by mouth daily.   04/15/2018 at Unknown time  . Cholecalciferol (VITAMIN D) 50 MCG (2000 UT) CAPS Take 4,000 Units by mouth daily.   Past Week at Unknown time  . Coenzyme Q10 (COQ10) 150 MG CAPS Take 150 mg by mouth daily.   Past Week at Unknown time  . docusate sodium (COLACE) 100 MG capsule Take 1 capsule (100 mg total) by mouth 2 (two) times daily. 10 capsule 0   . Glucosamine-Chondroit-Vit C-Mn (GLUCOSAMINE 1500 COMPLEX PO) Take 1 tablet by mouth daily.   Past Week at Unknown time  . heparin 5000 UNIT/ML injection Inject 1 mL (5,000 Units total) into the  skin every 8 (eight) hours. 1 mL    . Homeopathic Products (ARNICA [place] PO) Take 1 tablet by mouth 2 (two) times daily as needed (pain).   Past Month at Unknown time  . Homeopathic Products (TRAUMEEL EX) Apply 1 application topically 2 (two) times daily as needed (pain).   More than a month at Unknown time  . HYDROcodone-acetaminophen (NORCO/VICODIN) 5-325 MG tablet Take 1 tablet by mouth every 4 (four) hours as needed for moderate pain. 30 tablet 0   . olmesartan (BENICAR) 40 MG tablet Take 40 mg by mouth daily.  0 Past Week at Unknown time  . torsemide (DEMADEX) 20 MG tablet Take 2 tablets (40 mg total) by mouth daily. (Patient taking differently: Take 40 mg by mouth daily as needed (edema). ) 30 tablet 0 More than a  month at Unknown time  . vitamin B-12 (CYANOCOBALAMIN) 500 MCG tablet Take 500 mcg by mouth daily.   Past Week at Unknown time  . Zinc 30 MG TABS Take 30 mg by mouth daily.   Past Week at Unknown time     Results for orders placed or performed during the hospital encounter of 04/19/18 (from the past 48 hour(s))  Glucose, capillary     Status: Abnormal   Collection Time: 04/20/18  4:38 PM  Result Value Ref Range   Glucose-Capillary 170 (H) 70 - 99 mg/dL  Glucose, capillary     Status: Abnormal   Collection Time: 04/20/18  9:41 PM  Result Value Ref Range   Glucose-Capillary 200 (H) 70 - 99 mg/dL  Glucose, capillary     Status: Abnormal   Collection Time: 04/21/18  6:29 AM  Result Value Ref Range   Glucose-Capillary 158 (H) 70 - 99 mg/dL   Comment 1 Notify RN   Glucose, capillary     Status: Abnormal   Collection Time: 04/21/18 11:36 AM  Result Value Ref Range   Glucose-Capillary 199 (H) 70 - 99 mg/dL  Glucose, capillary     Status: Abnormal   Collection Time: 04/21/18  4:31 PM  Result Value Ref Range   Glucose-Capillary 177 (H) 70 - 99 mg/dL  Glucose, capillary     Status: Abnormal   Collection Time: 04/21/18 10:06 PM  Result Value Ref Range   Glucose-Capillary 230 (H) 70 - 99 mg/dL   Comment 1 Notify RN   Glucose, capillary     Status: Abnormal   Collection Time: 04/22/18  6:19 AM  Result Value Ref Range   Glucose-Capillary 191 (H) 70 - 99 mg/dL  Glucose, capillary     Status: Abnormal   Collection Time: 04/22/18 12:07 PM  Result Value Ref Range   Glucose-Capillary 206 (H) 70 - 99 mg/dL    No results found.  ROS Blood pressure (!) 192/92, pulse 89, temperature 99.5 F (37.5 C), temperature source Oral, resp. rate 18, height 5\' 6"  (1.676 m), weight 100.6 kg, SpO2 (!) 88 %. Physical Exam Physical Examination: General appearance - groggy, pale , prom R facial Mental status - answers some questions and is appropriate but falls asleep, and not all questions  appropriate Eyes - HTN changes Mouth - dry Neck - adenopathy noted PCL Lymphatics - posterior cervical nodes Chest - decreased air entry noted bilat Heart - S1 and S2 normal, S4 present, systolic murmur NT6/1 at 2nd left intercostal space, freq irreg Abdomen - obese, pos bs, liver down 5 cm Extremities - pedal edema 2 +, intact peripheral pulses Skin - pale  Assessment/Plan: 1  Severe HTn , needs beta blocker, and diuretics. Has vol xs.   2 CKD3-4 will worsen with better bp control 3 Hypertension: as above 4. Anemia need fe and eva 5. Metabolic Bone Disease: check PTH 6 Brain tumor 7 DM controlled P Carvedilol, get off hydral, add Lasix, U/S, PTH, Fe  Natalie Friedman 04/22/2018, 4:25 PM

## 2018-04-22 NOTE — Progress Notes (Signed)
Patient's blood pressure remained elevated after scheduled hydralazine, prn Clonidine. PA made aware. Orders given to maintain medication plan as established by renal MD this pm. Will report to oncoming RN.

## 2018-04-23 ENCOUNTER — Inpatient Hospital Stay (HOSPITAL_COMMUNITY): Payer: Medicare Other | Admitting: Physical Therapy

## 2018-04-23 ENCOUNTER — Inpatient Hospital Stay (HOSPITAL_COMMUNITY): Payer: Medicare Other | Admitting: Speech Pathology

## 2018-04-23 ENCOUNTER — Inpatient Hospital Stay (HOSPITAL_COMMUNITY): Payer: Medicare Other

## 2018-04-23 DIAGNOSIS — N189 Chronic kidney disease, unspecified: Secondary | ICD-10-CM

## 2018-04-23 DIAGNOSIS — N183 Chronic kidney disease, stage 3 (moderate): Secondary | ICD-10-CM

## 2018-04-23 DIAGNOSIS — E876 Hypokalemia: Secondary | ICD-10-CM

## 2018-04-23 LAB — RENAL FUNCTION PANEL
Albumin: 2.8 g/dL — ABNORMAL LOW (ref 3.5–5.0)
Anion gap: 10 (ref 5–15)
BUN: 28 mg/dL — ABNORMAL HIGH (ref 8–23)
CO2: 30 mmol/L (ref 22–32)
Calcium: 9.7 mg/dL (ref 8.9–10.3)
Chloride: 99 mmol/L (ref 98–111)
Creatinine, Ser: 2.3 mg/dL — ABNORMAL HIGH (ref 0.44–1.00)
GFR calc Af Amer: 24 mL/min — ABNORMAL LOW (ref >60)
GFR calc non Af Amer: 21 mL/min — ABNORMAL LOW (ref >60)
Glucose, Bld: 169 mg/dL — ABNORMAL HIGH (ref 70–99)
Phosphorus: 2.9 mg/dL (ref 2.5–4.6)
Potassium: 3.4 mmol/L — ABNORMAL LOW (ref 3.5–5.1)
Sodium: 139 mmol/L (ref 135–145)

## 2018-04-23 LAB — IRON AND TIBC
Iron: 25 ug/dL — ABNORMAL LOW (ref 28–170)
Saturation Ratios: 15 % (ref 10.4–31.8)
TIBC: 168 ug/dL — ABNORMAL LOW (ref 250–450)
UIBC: 143 ug/dL

## 2018-04-23 LAB — GLUCOSE, CAPILLARY
Glucose-Capillary: 164 mg/dL — ABNORMAL HIGH (ref 70–99)
Glucose-Capillary: 188 mg/dL — ABNORMAL HIGH (ref 70–99)
Glucose-Capillary: 156 mg/dL — ABNORMAL HIGH (ref 70–99)
Glucose-Capillary: 189 mg/dL — ABNORMAL HIGH (ref 70–99)

## 2018-04-23 MED ORDER — POTASSIUM CHLORIDE CRYS ER 20 MEQ PO TBCR: 40.0000 meq | EXTENDED_RELEASE_TABLET | Freq: Once | ORAL | Status: DC

## 2018-04-23 MED ORDER — POTASSIUM CHLORIDE CRYS ER 20 MEQ PO TBCR
20.0000 meq | EXTENDED_RELEASE_TABLET | Freq: Two times a day (BID) | ORAL | Status: AC
  Administered 2018-04-23: 20 meq via ORAL
  Filled 2018-04-23: qty 1

## 2018-04-23 MED ORDER — HEPARIN SODIUM (PORCINE) 5000 UNIT/ML IJ SOLN
5000.0000 [IU] | Freq: Three times a day (TID) | INTRAMUSCULAR | Status: DC
  Administered 2018-04-23 – 2018-05-08 (×46): 5000 [IU] via SUBCUTANEOUS
  Filled 2018-04-23 (×48): qty 1

## 2018-04-23 MED ORDER — POTASSIUM CHLORIDE CRYS ER 20 MEQ PO TBCR
20.0000 meq | EXTENDED_RELEASE_TABLET | Freq: Every day | ORAL | Status: DC
  Administered 2018-04-23 – 2018-04-25 (×3): 20 meq via ORAL
  Filled 2018-04-23 (×4): qty 1

## 2018-04-23 NOTE — Progress Notes (Signed)
Subjective: Interval History: has no complaint , weak but feels better.  Objective: Vital signs in last 24 hours: Temp:  [98.9 F (37.2 C)-99.7 F (37.6 C)] 98.9 F (37.2 C) (01/14 0501) Pulse Rate:  [87-104] 104 (01/14 0501) Resp:  [18] 18 (01/14 0501) BP: (153-192)/(79-134) 175/95 (01/14 0501) SpO2:  [88 %-94 %] 94 % (01/14 0501) Weight change:   Intake/Output from previous day: 01/13 0701 - 01/14 0700 In: 400 [P.O.:400] Out: 2300 [Urine:2300] Intake/Output this shift: Total I/O In: 440 [P.O.:440] Out: -   General appearance: alert, cooperative and slow speech , R 7th Resp: clear to auscultation bilaterally Cardio: S1, S2 normal and systolic murmur: systolic ejection 2/6, crescendo and decrescendo at 2nd left intercostal space GI: obese, pos bs Extremities: edema tr   Lab Results: No results for input(s): WBC, HGB, HCT, PLT in the last 72 hours. BMET:  Recent Labs    04/23/18 0529  NA 139  K 3.4*  CL 99  CO2 30  GLUCOSE 169*  BUN 28*  CREATININE 2.30*  CALCIUM 9.7   No results for input(s): PTH in the last 72 hours. Iron Studies: No results for input(s): IRON, TIBC, TRANSFERRIN, FERRITIN in the last 72 hours.  Studies/Results: US Renal  Result Date: 04/22/2018 CLINICAL DATA:  Chronic kidney disease. History of hypertension, diabetes, congestive heart failure. EXAM: RENAL / URINARY TRACT ULTRASOUND COMPLETE COMPARISON:  05/12/2016 FINDINGS: Right Kidney: Renal measurements: 11.5 x 5.2 x 4.7 cm = volume: 147 mL. Visualization is somewhat limited due to bowel gas but the renal parenchymal thickness and echotexture appear normal. No hydronephrosis or solid mass lesions identified. Left Kidney: Renal measurements: 11.9 x 6.1 x 4.9 cm = volume: 189 mL. Visualization is somewhat limited due to bowel gas. Diffuse increased parenchymal echotexture suggesting chronic medical renal disease. No hydronephrosis or solid mass lesions identified. Bladder: Appears normal for  degree of bladder distention. IMPRESSION: No hydronephrosis of either kidney. Electronically Signed   By: Lucienne Capers M.D.   On: 04/22/2018 19:55    I have reviewed the patient's current medications.  Assessment/Plan: 1 CKD 3 stable 2 HTN improving with changes made 3 Brain tumor 4 Obesity P cont meds, control bp, f/uPTH, fe    LOS: 4 days   Jeneen Rinks Caterra Ostroff 04/23/2018,1:15 PM

## 2018-04-23 NOTE — Progress Notes (Signed)
Physical Therapy Session Note  Patient Details  Name: Natalie Friedman MRN: [id number] Date of Birth: 1951-01-11  Today's Date: 04/23/2018 PT Individual Time: 1110-1200 PT Individual Time Calculation (min): 50 min   Short Term Goals: Week 1:  PT Short Term Goal 1 (Week 1): Pt will increase bed mobility to c/g. PT Short Term Goal 2 (Week 1): Pt will increase transfers to min A.  PT Short Term Goal 3 (Week 1): Pt will ambulate with LRAD about 50 feet PT Short Term Goal 4 (Week 1): Pt will ascend/descend 4 stairs with B rails. PT Short Term Goal 5 (Week 1): Pt will propel w/c about 100 feet with S.   Skilled Therapeutic Interventions/Progress Updates: Pt presented in bed agreeable to therapy. Pt denies pain and states dizziness is less than previous day. Performed supine to sit on L side of bed with min to modA and use of features. Pt noted to have improved sitting balance this session as compared to previous session. Performed sitting reaching activities for balance and forced use of R hand. Performed stand pivot to R with RW. modA and transported [employer] and participated in Dyn[employer] mode B 5 sec change. Pt was unable to attain target within time frame during x 2 1 min trials however pt would hit target late. Pt then participated in UBE L1 x 4 min (2 forward/2 backwards) for conditioning and sustained task. Pt returned to room and performed stand pivot modA to return to bed per nsg request. Pt was able to scoot sitting EOB minA and returned to supine CGA and increased time without assistance for BLE. Pt left in care of nsg.      Therapy Documentation Precautions:  Precautions Precautions: Fall Precaution Comments: dizziness and nausea, h/o falls, double vision Other Brace: glasses with opaque occlusion on right lens of glasses Restrictions Weight Bearing Restrictions: No General: PT Missed Treatment Reason: Toileting(cath) Vital Signs: Therapy Vitals Temp: 98 F (36.7 C) Temp Source:  Oral Pulse Rate: 75 Resp: 18 BP: (!) 123/54 Patient Position (if appropriate): Lying Oxygen Therapy SpO2: 96 % O2 Device: Nasal Cannula O2 Flow Rate (L/min): 1 L/min Pain: Pain Assessment Pain Scale: 0-10 Pain Score: 0-No pain    Therapy/Group: Individual Therapy  Kwadwo Taras  Machai Desmith, PTA  04/23/2018, 4:34 PM

## 2018-04-23 NOTE — Progress Notes (Signed)
Speech Language Pathology Daily Session Note  Patient Details  Name: Natalie Friedman MRN: [id number] Date of Birth: [birth year]  Today's Date: 04/23/2018 SLP Individual Time: 0803-0900 SLP Individual Time Calculation (min): 57 min  Short Term Goals: Week 1: SLP Short Term Goal 1 (Week 1): Pt will demonstrate speech intelligibility strategies at sentence level with 80% intelligibility given min A verbal cues. SLP Short Term Goal 2 (Week 1): Pt will consume dys 1 and thin liquid via straw with min overt s/s aspiration and min A verbal cues for swallow strategies.  SLP Short Term Goal 3 (Week 1): Pt will consume trials of dys 2 textured foods with min overt s/s aspiration and min A verbal cues for swallow strategies.   Skilled Therapeutic Interventions:  Pt was seen for skilled ST targeting dysphagia and speech intelligibility goals.  Pt was sleeping in bed upon therapist's arrival but awakened with increased time and basic environmental modifications to maximize alertness.  Pt was transferred to wheelchair with assist from nursing to maximize safety for PO intake.  Pt needed min assist verbal cues to monitor and correct anterior loss of boluses as well as pocketing of solids.  No overt s/s of aspiration were evident with solids or liquids.  During functional conversations with therapist, pt needed mod assist verbal cues to slow rate, increase vocal intensity, and overarticulate to achieve intelligibility at the phrase level.  Pt was left in wheelchair with nursing at bedside to carry out full supervision of breakfast meal.  Chair alarm was set and call bell was within reach.  Continue per current plan of care.     Pain Pain Assessment Pain Scale: 0-10 Pain Score: 0-No pain  Therapy/Group: Individual Therapy  Natalie Friedman, Selinda Orion 04/23/2018, 12:22 PM

## 2018-04-23 NOTE — Progress Notes (Signed)
Occupational Therapy Session Note  Patient Details  Name: Natalie Friedman MRN: [id number] Date of Birth: 01-Feb-1951  Today's Date: 04/23/2018 OT Individual Time: 0930-1030 OT Individual Time Calculation (min): 60 min  15 min missed d/t pt fatigue    Short Term Goals: Week 1:  OT Short Term Goal 1 (Week 1): Pt will demonstrate improved sitting balance to bathe UB sitting EOB with S. OT Short Term Goal 2 (Week 1): Pt will demonstrate improved UE strength to be able to wash UB adequately with S. OT Short Term Goal 3 (Week 1): Pt will sit to stand to prepare for LB dressing with min - mod A. OT Short Term Goal 4 (Week 1): Pt will completed toilet transfer with mod A. OT Short Term Goal 5 (Week 1): Pt will demonstrate improved R hand coordination with being able to hold a utensil fully during eating.  Skilled Therapeutic Interventions/Progress Updates:    Session focused on bathing/dressing tasks at shower level. Pt received sitting up in w/c with RN present, no c/o pain or dizziness. Pt completed oral hygiene at sink with moderate cueing and min A for thoroughness. Pt completed SPT from w/c to [facility] in walk in shower with mod A, moderate cueing for UE placement. Pt sat on BSC and completed UB bathing with min A, max A for LB bathing. Vc for termination and initiation of tasks provided in shower. Pt completed SPT out of shower with mod A, poor safety awareness overall, flopping into w/c and heavy guiding from OT. Pt donned underwear with max A, reaching distally but requiring guiding assist to thread B LE and then to pull up in standing. Following seated rest break pt's SpO2 was 88%, pt asymptomatic. Pt completed guided breathing and O2 improved to 90%. Pt c/o diplopia throughout session, however described it as "Mild". Pt donned pants sit <> stand with mod A to pull up, threading B LE through with CGA for dynamic sitting balance. Edu provided for hemi dressing techniques and importance of rest breaks. 2L  [place]arl were administered with extended seated rest break. Pt returned to bed d/t continued c/o fatigue, mod A SPT. Occasional manual facilitation provided to R UE during transfers d/t ataxia. Once supine pt almost immediately fell asleep. Pt was left supine with all needs met, 15 min skilled OT missed d/t pt fatigue.   Therapy Documentation Precautions:  Precautions Precautions: Fall Precaution Comments: dizziness and nausea, h/o falls, double vision Other Brace: glasses with opaque occlusion on right lens of glasses Restrictions Weight Bearing Restrictions: No  Pain: Pain Assessment Pain Scale: 0-10 Pain Score: 0-No pain   Therapy/Group: Individual Therapy  Curtis Sites 04/23/2018, 10:30 AM

## 2018-04-23 NOTE — Progress Notes (Signed)
PHYSICAL MEDICINE & REHABILITATION PROGRESS NOTE  Subjective/Complaints: Patient seen laying in bed this morning.  She indicates she slept well overnight, but is still very sleepy this morning.  ROS: Denies CP, shortness of breath, nausea, vomiting, diarrhea.  Objective: Vital Signs: Blood pressure (!) 175/95, pulse (!) 104, temperature 98.9 F (37.2 C), resp. rate 18, height 5\' 6"  (1.676 m), weight 100.6 kg, SpO2 94 %. US Renal  Result Date: 04/22/2018 CLINICAL DATA:  Chronic kidney disease. History of hypertension, diabetes, congestive heart failure. EXAM: RENAL / URINARY TRACT ULTRASOUND COMPLETE COMPARISON:  05/12/2016 FINDINGS: Right Kidney: Renal measurements: 11.5 x 5.2 x 4.7 cm = volume: 147 mL. Visualization is somewhat limited due to bowel gas but the renal parenchymal thickness and echotexture appear normal. No hydronephrosis or solid mass lesions identified. Left Kidney: Renal measurements: 11.9 x 6.1 x 4.9 cm = volume: 189 mL. Visualization is somewhat limited due to bowel gas. Diffuse increased parenchymal echotexture suggesting chronic medical renal disease. No hydronephrosis or solid mass lesions identified. Bladder: Appears normal for degree of bladder distention. IMPRESSION: No hydronephrosis of either kidney. Electronically Signed   By: Lucienne Capers M.D.   On: 04/22/2018 19:55   No results for input(s): WBC, HGB, HCT, PLT in the last 72 hours. Recent Labs    04/23/18 0529  NA 139  K 3.4*  CL 99  CO2 30  GLUCOSE 169*  BUN 28*  CREATININE 2.30*  CALCIUM 9.7    Physical Exam: BP (!) 175/95 (BP Location: Right Arm)   Pulse (!) 104   Temp 98.9 F (37.2 C)   Resp 18   Ht 5\' 6"  (1.676 m)   Wt 100.6 kg   SpO2 94%   BMI 35.80 kg/m  Constitutional: No distress . Vital signs reviewed.  Obese HENT: Right craniotomy C/D/I Eyes: EOMI. No discharge. Cardiovascular: RRR.  No JVD. Respiratory: CTA bilaterally.  Normal effort. GI: BS +.  Non-distended. Musc: Lower extremity edema. Neurological: Alert Right facial weakness Dysarthria Motor: 4-/5 prox to distal throughout, left stronger than right, stable.   Skin: Skin iswarm.Crani incision CDI Psychiatric: She has a normal mood and affect. Herbehavior is normal.  Assessment/Plan: 1. 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  Physiatrist is providing close team supervision and 24 hour management of active medical problems listed below.  Physiatrist and rehab team continue to assess barriers to discharge/monitor patient progress toward functional and medical goals  Care Tool:  Bathing    Body parts bathed by patient: Right arm, Left arm, Chest, Abdomen, Face, Right upper leg, Left upper leg   Body parts bathed by helper: Right lower leg, Left lower leg, Front perineal area, Buttocks     Bathing assist Assist Level: Moderate Assistance - Patient 50 - 74%     Upper Body Dressing/Undressing Upper body dressing   What is the patient wearing?: Hospital gown only    Upper body assist Assist Level: Minimal Assistance - Patient > 75%    Lower Body Dressing/Undressing Lower body dressing      What is the patient wearing?: Hospital gown only     Lower body assist Assist for lower body dressing: Minimal Assistance - Patient > 75%     Toileting Toileting    Toileting assist Assist for toileting: Total Assistance - Patient < 25%     Transfers Chair/bed transfer  Transfers assist     Chair/bed transfer  assist level: Moderate Assistance - Patient 50 - 74%(stand pivot)     Locomotion Ambulation   Ambulation assist      Assist level: Moderate Assistance - Patient 50 - 74% Assistive device: Parallel bars Max distance: 2   Walk 10 feet activity   Assist  Walk 10 feet activity did not occur: Safety/medical concerns         Walk 50 feet activity   Assist Walk 50 feet with 2 turns activity did not occur: Safety/medical concerns         Walk 150 feet activity   Assist Walk 150 feet activity did not occur: Safety/medical concerns         Walk 10 feet on uneven surface  activity   Assist Walk 10 feet on uneven surfaces activity did not occur: Safety/medical concerns         Wheelchair     Assist Will patient use wheelchair at discharge?: Yes Type of Wheelchair: Manual    Wheelchair assist level: Minimal Assistance - Patient > 75% Max wheelchair distance: 25    Wheelchair 50 feet with 2 turns activity    Assist    Wheelchair 50 feet with 2 turns activity did not occur: Safety/medical concerns       Wheelchair 150 feet activity     Assist Wheelchair 150 feet activity did not occur: Safety/medical concerns          Medical Problem List and Plan: 1.Functional deficits and truncal/limb ataxiasecondary to vestibular schwannoma s/p crani/resection  Cont CIR 2. DVT Prophylaxis/Anticoagulation: Pharmaceutical:Lovenox 3.Chronic back pain/Pain Management:Tylenol prn 4. Mood:LCSW to follow for evaluation and support. 5. Neuropsych: This patientiscapable of making decisions on herown behalf. 6. Skin/Wound Care:Routine pressure relief measures. Monitor wound for healing. 7. Fluids/Electrolytes/Nutrition:Monitor I/O.  8. T2DM:Hgb A1c- 6.5.Monitor BS ac/hs. Use SSI for elevated BS--is on very limited diet.  Elevated, will?  Improving on 1/14  Monitor with increased mobility 9. Acute on chronic renal failure: Baseline SCr- 2.4--->2.54.  Creatinine 2.30 on 1/14  Expect worsening of kidney function with changes to blood pressure medications.  Continue to monitor 10. Occult spina bifida with neuropathy: Managed with OTC supplements. 11. Leucocytosis: Monitor for signs of infection. 12. Acute blood loss anemia:  Hemoglobin 10.2 on 1/11  Continue to  monitor 13. Vestibular dysfunction: N/V with dizzinessimproving. -treat supportively 14. HTN: BPstill labile---Monitor BP bid. Continue Avapro and Norvasc.  Hydralizine 25 TID started on 1/12, DC'd on 1/13  Remains elevated with hypertensive crisis  Renal ultrasound relative unremarkable  Appreciate nephro recs-Lasix added, carvedilol added  Monitor with increased mobility 15. Hypoxia: Encourage IS.   Wean oxygen to off as tolerated. 16. Right CN VII palsy: Improving -eye lubricants 17.  Hypoalbuminemia  Supplement initiated on 1/11 18.  Cloudy and foul urine  UA equivocal  Cont to monitor 19.  Oral blister  Oral gel ordered 20.  Hypokalemia  Potassium 3.4 on 1/14  Supplement initiated on 1/14   LOS: 4 days A FACE TO FACE EVALUATION WAS PERFORMED  Ankit Lorie Phenix 04/23/2018, 8:28 AM

## 2018-04-23 NOTE — Plan of Care (Signed)
  Problem: RH BOWEL ELIMINATION Goal: RH STG MANAGE BOWEL WITH ASSISTANCE Description STG Manage Bowel with Assistance. mod  Outcome: Progressing   Problem: RH BLADDER ELIMINATION Goal: RH STG MANAGE BLADDER WITH ASSISTANCE Description STG Manage Bladder With Assistance mod  Outcome: Progressing   Problem: RH SKIN INTEGRITY Goal: RH STG SKIN FREE OF INFECTION/BREAKDOWN Description Remain free of skin breakdown and infection while on rehab with mod assist  Outcome: Progressing   Problem: RH SAFETY Goal: RH STG ADHERE TO SAFETY PRECAUTIONS W/ASSISTANCE/DEVICE Description STG Adhere to Safety Precautions With Assistance/Device. Mod  Outcome: Progressing   Problem: Consults Goal: Skin Care Protocol Initiated - if Braden Score 18 or less Description If consults are not indicated, leave blank or document N/A Outcome: Progressing

## 2018-04-24 ENCOUNTER — Inpatient Hospital Stay (HOSPITAL_COMMUNITY): Payer: Medicare Other | Admitting: Speech Pathology

## 2018-04-24 ENCOUNTER — Inpatient Hospital Stay (HOSPITAL_COMMUNITY): Payer: Medicare Other | Admitting: Physical Therapy

## 2018-04-24 ENCOUNTER — Inpatient Hospital Stay (HOSPITAL_COMMUNITY): Payer: Medicare Other | Admitting: Occupational Therapy

## 2018-04-24 DIAGNOSIS — N184 Chronic kidney disease, stage 4 (severe): Secondary | ICD-10-CM

## 2018-04-24 DIAGNOSIS — E669 Obesity, unspecified: Secondary | ICD-10-CM

## 2018-04-24 DIAGNOSIS — E1169 Type 2 diabetes mellitus with other specified complication: Secondary | ICD-10-CM

## 2018-04-24 DIAGNOSIS — R1310 Dysphagia, unspecified: Secondary | ICD-10-CM

## 2018-04-24 LAB — RENAL FUNCTION PANEL
ALBUMIN: 2.7 g/dL — AB (ref 3.5–5.0)
Anion gap: 8 (ref 5–15)
BUN: 31 mg/dL — ABNORMAL HIGH (ref 8–23)
CO2: 33 mmol/L — ABNORMAL HIGH (ref 22–32)
Calcium: 9.4 mg/dL (ref 8.9–10.3)
Chloride: 98 mmol/L (ref 98–111)
Creatinine, Ser: 2.51 mg/dL — ABNORMAL HIGH (ref 0.44–1.00)
GFR calc Af Amer: 22 mL/min — ABNORMAL LOW (ref >60)
GFR calc non Af Amer: 19 mL/min — ABNORMAL LOW (ref >60)
Glucose, Bld: 162 mg/dL — ABNORMAL HIGH (ref 70–99)
Phosphorus: 5.4 mg/dL — ABNORMAL HIGH (ref 2.5–4.6)
Potassium: 3.5 mmol/L (ref 3.5–5.1)
Sodium: 139 mmol/L (ref 135–145)

## 2018-04-24 LAB — CBC
HCT: 30.4 % — ABNORMAL LOW (ref 36.0–46.0)
Hemoglobin: 10.1 g/dL — ABNORMAL LOW (ref 12.0–15.0)
MCH: 30.5 pg (ref 26.0–34.0)
MCHC: 33.2 g/dL (ref 30.0–36.0)
MCV: 91.8 fL (ref 80.0–100.0)
Platelets: 203 10*3/uL (ref 150–400)
RBC: 3.31 MIL/uL — ABNORMAL LOW (ref 3.87–5.11)
RDW: 11 % — ABNORMAL LOW (ref 11.5–15.5)
WBC: 8.1 10*3/uL (ref 4.0–10.5)
nRBC: 0 % (ref 0.0–0.2)

## 2018-04-24 LAB — GLUCOSE, CAPILLARY
Glucose-Capillary: 163 mg/dL — ABNORMAL HIGH (ref 70–99)
Glucose-Capillary: 235 mg/dL — ABNORMAL HIGH (ref 70–99)
Glucose-Capillary: 159 mg/dL — ABNORMAL HIGH (ref 70–99)
Glucose-Capillary: 181 mg/dL — ABNORMAL HIGH (ref 70–99)

## 2018-04-24 LAB — PARATHYROID HORMONE, INTACT (NO CA): PTH: 25 pg/mL (ref 15–65)

## 2018-04-24 MED ORDER — GLIMEPIRIDE 1 MG PO TABS
1.0000 mg | ORAL_TABLET | Freq: Every day | ORAL | Status: DC
  Administered 2018-04-24 – 2018-05-09 (×16): 1 mg via ORAL
  Filled 2018-04-24 (×15): qty 1

## 2018-04-24 NOTE — Plan of Care (Signed)
  Problem: RH BOWEL ELIMINATION Goal: RH STG MANAGE BOWEL WITH ASSISTANCE Description STG Manage Bowel with Assistance. mod  Outcome: Progressing   Problem: RH BLADDER ELIMINATION Goal: RH STG MANAGE BLADDER WITH ASSISTANCE Description STG Manage Bladder With Assistance mod  Outcome: Progressing   Problem: RH SKIN INTEGRITY Goal: RH STG SKIN FREE OF INFECTION/BREAKDOWN Description Remain free of skin breakdown and infection while on rehab with mod assist  Outcome: Progressing   Problem: RH SAFETY Goal: RH STG ADHERE TO SAFETY PRECAUTIONS W/ASSISTANCE/DEVICE Description STG Adhere to Safety Precautions With Assistance/Device. Mod  Outcome: Progressing   Problem: Consults Goal: Skin Care Protocol Initiated - if Braden Score 18 or less Description If consults are not indicated, leave blank or document N/A Outcome: Progressing

## 2018-04-24 NOTE — Progress Notes (Signed)
Occupational Therapy Session Note  Patient Details  Name: Natalie Friedman MRN: [id number] Date of Birth: 11-28-1950  Today's Date: 04/24/2018 OT Individual Time: 1300-1400 OT Individual Time Calculation (min): 60 min    Short Term Goals: Week 1:  OT Short Term Goal 1 (Week 1): Pt will demonstrate improved sitting balance to bathe UB sitting EOB with S. OT Short Term Goal 2 (Week 1): Pt will demonstrate improved UE strength to be able to wash UB adequately with S. OT Short Term Goal 3 (Week 1): Pt will sit to stand to prepare for LB dressing with min - mod A. OT Short Term Goal 4 (Week 1): Pt will completed toilet transfer with mod A. OT Short Term Goal 5 (Week 1): Pt will demonstrate improved R hand coordination with being able to hold a utensil fully during eating.  Skilled Therapeutic Interventions/Progress Updates:    Pt completed transfer from the toilet to the shower to start session with use of the [place].  Nursing had assisted her to the bathroom prior to session for toileting.  Once over at the shower, she needed max assist for removal of TEDs and gripper socks.  She completed bathing with min instructional cueing to sequence and mod assist for sit to stand X 2.  She then utilized the [place] for transfer out of the shower to the wheelchair for dressing tasks.  Per her report, she still does not have clothing so hospital gowns and brief were donned.  She was able to thread the brief over her legs with min instructional cueing to start with the harder right side first.  Mod assist for sit to stand with mod assist for pulling brief over her hips.  Therapist assisted with donning TEDs.  She was able to donn the right sock with supervision but needed min assist for the left.  Gowns donned as well with therapy concluded with stand pivot transfer to the bed from the wheelchair.  She needed max assist to complete stand pivot as she did not move her RLE and lost her balance toward the bed in stead.   Therapist provided education on weightshifting for moving the LE as she did not attempt this during transfer secondary to increased fear and fatigue.  Finished session with transition to supine with mod assist.  Pt left in bed with call button and phone in reach and bed alarm on  Therapy Documentation Precautions:  Precautions Precautions: Fall Precaution Comments: dizziness and nausea, h/o falls, double vision Other Brace: glasses with opaque occlusion on right lens of glasses Restrictions Weight Bearing Restrictions: No  Pain: Pain Assessment Pain Scale: Faces Pain Score: 0-No pain Faces Pain Scale: Hurts a little bit Pain Type: Acute pain Pain Location: Back Pain Orientation: Lower Pain Descriptors / Indicators: Grimacing Pain Onset: With Activity Pain Intervention(s): Repositioned ADL: See Care Tool Section for some ADL details  Therapy/Group: Individual Therapy  Shamyra Farias OTR/L 04/24/2018, 3:55 PM

## 2018-04-24 NOTE — Progress Notes (Signed)
Occupational Therapy Note  Patient Details  Name: Natalie Friedman MRN: [id number] Date of Birth: 1950-12-21  Pt declined pm OT session secondary to fatigue and not feeling well.  Re-emphasized the need for doing as much therapy as she can with expectations of discharge 05/10/18.  Pt voices understanding but still declines at this time.  Pt missed 30 mins total.    Zabrina Brotherton OTR/L 04/24/2018, 4:01 PM

## 2018-04-24 NOTE — Progress Notes (Signed)
Physical Therapy Session Note  Patient Details  Name: Natalie Friedman MRN: [id number] Date of Birth: 1951-02-21  Today's Date: 04/24/2018 PT Individual Time: 0900-1000 PT Individual Time Calculation (min): 60 min   Short Term Goals: Week 1:  PT Short Term Goal 1 (Week 1): Pt will increase bed mobility to c/g. PT Short Term Goal 2 (Week 1): Pt will increase transfers to min A.  PT Short Term Goal 3 (Week 1): Pt will ambulate with LRAD about 50 feet PT Short Term Goal 4 (Week 1): Pt will ascend/descend 4 stairs with B rails. PT Short Term Goal 5 (Week 1): Pt will propel w/c about 100 feet with S.   Skilled Therapeutic Interventions/Progress Updates:   Pt received sitting in WC and agreeable to PT. WC mobility x 18f with min assist from PT to prevent veer to the R and mod cues for equal use of BUE. Sit<>stand transfers in parallel bars, pt reports increased dizziness; Orthostatic vitals assessed. See below Pt able to maintain standing up to 1 min with min assist prior to sitting. Stedy transfer with min-CGA to and from mat table. Sitting balance EOB to retrieve horse shoe and toss to target 2 x 4 BUE supervision -min assist to improve anterior weigh shift and improve sitting balance in midline. [facility] returned to [facility] and [facility] and all needs met.        Therapy Documentation Precautions:  Precautions Precautions: Fall Precaution Comments: dizziness and nausea, h/o falls, double vision Other Brace: glasses with opaque occlusion on right lens of glasses Restrictions Weight Bearing Restrictions: No Vital Signs: Therapy Vitals Temp: 98.1 F (36.7 C) Temp Source: Oral Pulse Rate: 72 Resp: 16 BP: 140/73 sitting  108/78 standing. Very symptomatic.  Patient Position (if appropriate): Orthostatic Vitals Oxygen Therapy SpO2: 94 % O2 Device: Nasal Cannula O2 Flow Rate (L/min): 1 L/min Pain:   0/10   Therapy/Group: Individual Therapy  ALorie Phenix1/15/2020, 10:08 AM

## 2018-04-24 NOTE — Progress Notes (Signed)
Patient had blood pressure reading of 181/126 at 0612. Patient vomited at 0615. Patient re-assessed by RN. Pt given Catapres and Zofran. Patient is resting and asymptomatic. PA was notified and RN will continue to monitor.

## 2018-04-24 NOTE — Progress Notes (Signed)
[place] PHYSICAL MEDICINE & REHABILITATION PROGRESS NOTE  Subjective/Complaints: Patient seen laying in bed this morning.  He states he did not sleep well overnight, but feels better this morning.  ROS: Denies CP, shortness of breath, nausea, vomiting, diarrhea.  Objective: Vital Signs: Blood pressure (!) 181/126, pulse 72, temperature 98.1 F (36.7 C), temperature source Oral, resp. rate 16, height 5\' 6"  (1.676 m), weight 100.7 kg, SpO2 94 %. US Renal  Result Date: 04/22/2018 CLINICAL DATA:  Chronic kidney disease. History of hypertension, diabetes, congestive heart failure. EXAM: RENAL / URINARY TRACT ULTRASOUND COMPLETE COMPARISON:  05/12/2016 FINDINGS: Right Kidney: Renal measurements: 11.5 x 5.2 x 4.7 cm = volume: 147 mL. Visualization is somewhat limited due to bowel gas but the renal parenchymal thickness and echotexture appear normal. No hydronephrosis or solid mass lesions identified. Left Kidney: Renal measurements: 11.9 x 6.1 x 4.9 cm = volume: 189 mL. Visualization is somewhat limited due to bowel gas. Diffuse increased parenchymal echotexture suggesting chronic medical renal disease. No hydronephrosis or solid mass lesions identified. Bladder: Appears normal for degree of bladder distention. IMPRESSION: No hydronephrosis of either kidney. Electronically Signed   By: Lucienne Capers M.D.   On: 04/22/2018 19:55   Recent Labs    04/24/18 0519  WBC 8.1  HGB 10.1*  HCT 30.4*  PLT 203   Recent Labs    04/23/18 0529 04/24/18 0519  NA 139 139  K 3.4* 3.5  CL 99 98  CO2 30 33*  GLUCOSE 169* 162*  BUN 28* 31*  CREATININE 2.30* 2.51*  CALCIUM 9.7 9.4    Physical Exam: BP (!) 181/126 (BP Location: Left Arm)   Pulse 72   Temp 98.1 F (36.7 C) (Oral)   Resp 16   Ht 5\' 6"  (1.676 m)   Wt 100.7 kg   SpO2 94%   BMI 35.83 kg/m  Constitutional: No distress . Vital signs reviewed.  Obese HENT: Right craniotomy C/D/I Eyes: EOMI. No discharge. Cardiovascular: RRR.  No  JVD. Respiratory: CTA bilaterally.  Normal effort.  + [place]. GI: BS +. Non-distended. Musc: Lower extremity edema. Neurological: Alert Right facial weakness, unchanged Dysarthria Motor: 4-/5 prox to distal throughout, left stronger than right, stable.   Skin: Skin iswarm.Crani incision CDI Psychiatric: She has a normal mood and affect. Herbehavior is normal.  Assessment/Plan: 1. 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  Physiatrist is providing close team supervision and 24 hour management of active medical problems listed below.  Physiatrist and rehab team continue to assess barriers to discharge/monitor patient progress toward functional and medical goals  Care Tool:  Bathing    Body parts bathed by patient: Right arm, Left arm, Chest, Abdomen, Face, Right upper leg, Left upper leg   Body parts bathed by helper: Right lower leg, Left lower leg, Front perineal area, Buttocks     Bathing assist Assist Level: Moderate Assistance - Patient 50 - 74%     Upper Body Dressing/Undressing Upper body dressing   What is the patient wearing?: Hospital gown only    Upper body assist Assist Level: Minimal Assistance - Patient > 75%    Lower Body Dressing/Undressing Lower body dressing      What is the patient wearing?: Underwear/pull up, Pants     Lower body assist Assist for lower body dressing: Maximal Assistance - Patient 25 - 49%     Toileting Toileting    Toileting assist  Assist for toileting: Total Assistance - Patient < 25%     Transfers Chair/bed transfer  Transfers assist     Chair/bed transfer assist level: Moderate Assistance - Patient 50 - 74%     Locomotion Ambulation   Ambulation assist      Assist level: Moderate Assistance - Patient 50 - 74% Assistive device: Parallel bars Max distance: 2   Walk 10 feet  activity   Assist  Walk 10 feet activity did not occur: Safety/medical concerns        Walk 50 feet activity   Assist Walk 50 feet with 2 turns activity did not occur: Safety/medical concerns         Walk 150 feet activity   Assist Walk 150 feet activity did not occur: Safety/medical concerns         Walk 10 feet on uneven surface  activity   Assist Walk 10 feet on uneven surfaces activity did not occur: Safety/medical concerns         Wheelchair     Assist Will patient use wheelchair at discharge?: Yes Type of Wheelchair: Manual    Wheelchair assist level: Minimal Assistance - Patient > 75% Max wheelchair distance: 25    Wheelchair 50 feet with 2 turns activity    Assist    Wheelchair 50 feet with 2 turns activity did not occur: Safety/medical concerns       Wheelchair 150 feet activity     Assist Wheelchair 150 feet activity did not occur: Safety/medical concerns          Medical Problem List and Plan: 1.Functional deficits and truncal/limb ataxiasecondary to vestibular schwannoma s/p crani/resection  Cont CIR  Team conference today to discuss current and goals and coordination of care, home and environmental barriers, and discharge planning with nursing, case manager, and therapies.  2. DVT Prophylaxis/Anticoagulation: Pharmaceutical:Lovenox 3.Chronic back pain/Pain Management:Tylenol prn 4. Mood:LCSW to follow for evaluation and support. 5. Neuropsych: This patientiscapable of making decisions on herown behalf. 6. Skin/Wound Care:Routine pressure relief measures. Monitor wound for healing. 7. Fluids/Electrolytes/Nutrition:Monitor I/O.   D1 thins, advance diet as tolerated 8. T2DM:Hgb A1c- 6.5.Monitor BS ac/hs. Use SSI for elevated BS--is on very limited diet.  Amaryl 1 mg started on 1/15  Monitor with increased mobility 9. Acute on chronic renal disease: Baseline SCr- 2.4--->2.54.  Creatinine 2.51 on  1/15  Expect worsening of kidney function with changes to blood pressure medications.  Continue to monitor 10. Occult spina bifida with neuropathy: Managed with OTC supplements. 11. Leucocytosis: Monitor for signs of infection. 12. Acute blood loss anemia:  Hemoglobin 10.1 on 1/15  Continue to monitor 13. Vestibular dysfunction: N/V with dizzinessimproving. -treat supportively 14. HTN: BPstill labile---Monitor BP bid. Continue Avapro and Norvasc.  Hydralizine 25 TID started on 1/12, DC'd on 1/13  Remains elevated with hypertensive crisis on 1/15  Renal ultrasound relative unremarkable  Appreciate nephro recs-Lasix added, carvedilol added, remains extremely elevated, will await further recs  Informed by nursing regarding patient taking "herbs" from home.  Will inquire further as to particular herbs and side effects.  Monitor with increased mobility 15. Hypoxia: Encourage IS.   Wean oxygen to off as tolerated. 16. Right CN VII palsy: Improving -eye lubricants 17.  Hypoalbuminemia  Supplement initiated on 1/11 18.  Cloudy and foul urine  UA equivocal  Cont to monitor 19.  Oral blister  Oral gel ordered 20.  Hypokalemia  Potassium 3.5 on 1/15  Supplement initiated on 1/14   LOS: 5 days A  FACE TO FACE EVALUATION WAS PERFORMED   Lorie Phenix 04/24/2018, 8:31 AM

## 2018-04-24 NOTE — Progress Notes (Signed)
Speech Language Pathology Daily Session Note  Patient Details  Name: Natalie Friedman MRN: [id number] Date of Birth: 02/17/1951  Today's Date: 04/24/2018 SLP Individual Time: 0804-0900 SLP Individual Time Calculation (min): 56 min  Short Term Goals: Week 1: SLP Short Term Goal 1 (Week 1): Pt will demonstrate speech intelligibility strategies at sentence level with 80% intelligibility given min A verbal cues. SLP Short Term Goal 2 (Week 1): Pt will consume dys 1 and thin liquid via straw with min overt s/s aspiration and min A verbal cues for swallow strategies.  SLP Short Term Goal 3 (Week 1): Pt will consume trials of dys 2 textured foods with min overt s/s aspiration and min A verbal cues for swallow strategies.   Skilled Therapeutic Interventions:  Pt was seen for skilled ST targeting goals for dysphagia and cognition.  Upon arrival, pt was in bed, feeding herself breakfast with nurse tech providing full supervision.  Pt was transferred to wheelchair to maximize safety with PO intake.  Pt consumed dys 1 textures and thin liquids with overall min-mod assist for use of swallowing precautions to contain boluses orally.  No overt s/s of aspiration were evident with purees or liquids.  Recommend that pt remain on her currently prescribed diet.  SLP also facilitated the session with a novel picture description task to address speech intelligibility.  SLP provided skilled education regarding compensatory intelligibility strategies with pt able to teach back for 100% accuracy with mod I.  Pt needed min assist to increase vocal intensity and slow rate of speech to achieve intelligibility during picture description task.  Pt was left in dayroom with seat belt alarm donned awaiting scheduled PT session.  Continue per current plan of care.    Pain Pain Assessment Pain Scale: 0-10 Pain Score: 0-No pain  Therapy/Group: Individual Therapy  Natalie Friedman, Selinda Orion 04/24/2018, 2:30 PM

## 2018-04-25 ENCOUNTER — Inpatient Hospital Stay (HOSPITAL_COMMUNITY): Payer: Medicare Other | Admitting: Speech Pathology

## 2018-04-25 ENCOUNTER — Inpatient Hospital Stay (HOSPITAL_COMMUNITY): Payer: Medicare Other | Admitting: Physical Therapy

## 2018-04-25 ENCOUNTER — Inpatient Hospital Stay (HOSPITAL_COMMUNITY): Payer: Medicare Other | Admitting: Occupational Therapy

## 2018-04-25 LAB — RENAL FUNCTION PANEL
Albumin: 2.8 g/dL — ABNORMAL LOW (ref 3.5–5.0)
Anion gap: 12 (ref 5–15)
BUN: 36 mg/dL — ABNORMAL HIGH (ref 8–23)
CHLORIDE: 95 mmol/L — AB (ref 98–111)
CO2: 33 mmol/L — ABNORMAL HIGH (ref 22–32)
Calcium: 9.6 mg/dL (ref 8.9–10.3)
Creatinine, Ser: 2.71 mg/dL — ABNORMAL HIGH (ref 0.44–1.00)
GFR calc non Af Amer: 17 mL/min — ABNORMAL LOW (ref >60)
GFR, EST AFRICAN AMERICAN: 20 mL/min — AB (ref >60)
Glucose, Bld: 165 mg/dL — ABNORMAL HIGH (ref 70–99)
Phosphorus: 3.7 mg/dL (ref 2.5–4.6)
Potassium: 3.6 mmol/L (ref 3.5–5.1)
Sodium: 140 mmol/L (ref 135–145)

## 2018-04-25 LAB — GLUCOSE, CAPILLARY
Glucose-Capillary: 159 mg/dL — ABNORMAL HIGH (ref 70–99)
Glucose-Capillary: 192 mg/dL — ABNORMAL HIGH (ref 70–99)
Glucose-Capillary: 161 mg/dL — ABNORMAL HIGH (ref 70–99)
Glucose-Capillary: 131 mg/dL — ABNORMAL HIGH (ref 70–99)

## 2018-04-25 MED ORDER — SODIUM CHLORIDE 0.9 % IV SOLN
510.0000 mg | Freq: Once | INTRAVENOUS | Status: AC
  Administered 2018-04-25: 510 mg via INTRAVENOUS
  Filled 2018-04-25: qty 17

## 2018-04-25 NOTE — Progress Notes (Signed)
Physical Therapy Session Note  Patient Details  Name: Natalie Friedman MRN: [id number] Date of Birth: [birth year]  Today's Date: 04/25/2018 PT Individual Time: 0805-0900 PT Individual Time Calculation (min): 55 min   Short Term Goals: Week 1:  PT Short Term Goal 1 (Week 1): Pt will increase bed mobility to c/g. PT Short Term Goal 2 (Week 1): Pt will increase transfers to min A.  PT Short Term Goal 3 (Week 1): Pt will ambulate with LRAD about 50 feet PT Short Term Goal 4 (Week 1): Pt will ascend/descend 4 stairs with B rails. PT Short Term Goal 5 (Week 1): Pt will propel w/c about 100 feet with S.   Skilled Therapeutic Interventions/Progress Updates:   Pt received sitting in WC and agreeable to PT. Pt reports need for urination. stedy transfer to bathroom with CGA to come to standing. Pt able to perform self care sitting on toilet with supervision assist. Returned to [facility] with stedy and CGA. PT required to perform clothing management while pt standing in stedy.   WC mobility training x 16f with supervision assist overall from PT. Moderate cues for improved attention to and use of the RUE to maintain straight path and avoid obstacles on the R.   Sit<>stand [facility] with min assist from PT and SPT. Moderate cues for proper UE placement, LE placement and anterior weight shift. Standing balance/torelance 4 x 30-45 sec with min assist from PT with intermittent lateral weight shifting. No R LE knee instability noted.   Gait training with RW x 22 ft with mod assist overall and + 2 for WC follow. Moderate cues for step length, step to gaiat pattern, AD mangement and improved terminal knee extension on the R.   Stand pivot transfer to mat table with min assist overall and moderate cues for gait pattern and AD management.. Seated balance training instructed by PT on red wedge to force anterior weight shift. Forward/lateral reaches with BUE to target x 6 Bil.   [facility] returned to room  [facility] and all needs met.          Therapy Documentation Precautions:  Precautions Precautions: Fall Precaution Comments: dizziness and nausea, h/o falls, double vision Other Brace: glasses with opaque occlusion on right lens of glasses Restrictions Weight Bearing Restrictions: No    Vital Signs: Therapy Vitals Temp: 98.4 F (36.9 C) Temp Source: Oral Pulse Rate: 100 Resp: 16 BP: (!) 144/80 Patient Position (if appropriate): Lying Oxygen Therapy SpO2: 93 % O2 Device: Room Air Pain: 0/10    Therapy/Group: Individual Therapy  ALorie Phenix1/16/2020, 9:02 AM

## 2018-04-25 NOTE — Progress Notes (Signed)
[place] PHYSICAL MEDICINE & REHABILITATION PROGRESS NOTE  Subjective/Complaints: Patient seen sitting up in a chair this morning.  She states she slept fairly overnight due to constipation.  She is upset that she cannot drink her acupuncture tea.  ROS: Denies CP, shortness of breath, nausea, vomiting, diarrhea.  Objective: Vital Signs: Blood pressure (!) 144/80, pulse 100, temperature 98.4 F (36.9 C), temperature source Oral, resp. rate 16, height 5\' 6"  (1.676 m), weight 104 kg, SpO2 93 %. No results found. Recent Labs    04/24/18 0519  WBC 8.1  HGB 10.1*  HCT 30.4*  PLT 203   Recent Labs    04/24/18 0519 04/25/18 0707  NA 139 140  K 3.5 3.6  CL 98 95*  CO2 33* 33*  GLUCOSE 162* 165*  BUN 31* 36*  CREATININE 2.51* 2.71*  CALCIUM 9.4 9.6    Physical Exam: BP (!) 144/80 (BP Location: Right Arm)   Pulse 100   Temp 98.4 F (36.9 C) (Oral)   Resp 16   Ht 5\' 6"  (1.676 m)   Wt 104 kg   SpO2 93%   BMI 37.01 kg/m  Constitutional: No distress . Vital signs reviewed.  Obese HENT: Right craniotomy C/D/I Eyes: EOMI. No discharge. Cardiovascular: RRR.  No JVD. Respiratory: CTA bilaterally.  Normal effort.   GI: BS +. Non-distended. Musc: Lower extremity edema. Neurological: Alert Right facial weakness, stable Dysarthria Motor: 4-/5 prox to distal throughout, left stronger than right, unchanged.   Skin: Skin iswarm.Crani incision CDI Psychiatric: She has a normal mood and affect. Herbehavior is normal.  Assessment/Plan: 1. 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  Physiatrist is providing close team supervision and 24 hour management of active medical problems listed below.  Physiatrist and rehab team continue to assess barriers to discharge/monitor patient progress toward functional and medical goals  Care Tool:  Bathing    Body  parts bathed by patient: Right arm, Left arm, Chest, Abdomen, Face, Right upper leg, Left upper leg, Right lower leg, Left lower leg   Body parts bathed by helper: Front perineal area, Buttocks     Bathing assist Assist Level: Moderate Assistance - Patient 50 - 74%     Upper Body Dressing/Undressing Upper body dressing   What is the patient wearing?: Hospital gown only    Upper body assist Assist Level: Minimal Assistance - Patient > 75%    Lower Body Dressing/Undressing Lower body dressing      What is the patient wearing?: Incontinence brief     Lower body assist Assist for lower body dressing: Moderate Assistance - Patient 50 - 74%     Toileting Toileting    Toileting assist Assist for toileting: Total Assistance - Patient < 25%     Transfers Chair/bed transfer  Transfers assist     Chair/bed transfer assist level: Moderate Assistance - Patient 50 - 74%     Locomotion Ambulation   Ambulation assist      Assist level: Moderate Assistance - Patient 50 - 74% Assistive device: Parallel bars Max distance: 2   Walk 10 feet activity   Assist  Walk 10 feet activity did not occur: Safety/medical concerns        Walk 50 feet activity   Assist Walk 50 feet with 2 turns activity did not occur: Safety/medical concerns         Walk 150 feet activity   Assist Walk  150 feet activity did not occur: Safety/medical concerns         Walk 10 feet on uneven surface  activity   Assist Walk 10 feet on uneven surfaces activity did not occur: Safety/medical concerns         Wheelchair     Assist Will patient use wheelchair at discharge?: Yes Type of Wheelchair: Manual    Wheelchair assist level: Minimal Assistance - Patient > 75% Max wheelchair distance: 44ft    Wheelchair 50 feet with 2 turns activity    Assist    Wheelchair 50 feet with 2 turns activity did not occur: Safety/medical concerns   Assist Level: Minimal Assistance -  Patient > 75%   Wheelchair 150 feet activity     Assist Wheelchair 150 feet activity did not occur: Safety/medical concerns          Medical Problem List and Plan: 1.Functional deficits and truncal/limb ataxiasecondary to vestibular schwannoma s/p crani/resection  Cont CIR 2. DVT Prophylaxis/Anticoagulation: Pharmaceutical:Lovenox 3.Chronic back pain/Pain Management:Tylenol prn 4. Mood:LCSW to follow for evaluation and support. 5. Neuropsych: This patientiscapable of making decisions on herown behalf. 6. Skin/Wound Care:Routine pressure relief measures. Monitor wound for healing. 7. Fluids/Electrolytes/Nutrition:Monitor I/O.   D1 thins, advance diet as tolerated 8. T2DM:Hgb A1c- 6.5.Monitor BS ac/hs. Use SSI for elevated BS--is on very limited diet.  Amaryl 1 mg started on 1/15  Remains elevated on 1/16  Monitor with increased mobility 9. Acute on chronic renal disease: Baseline SCr- 2.4--->2.54.  Creatinine 2.71 on 1/16  Expect worsening of kidney function with changes to blood pressure medications.  Continue to monitor 10. Occult spina bifida with neuropathy: Managed with OTC supplements. 11. Leucocytosis: Monitor for signs of infection. 12. Acute blood loss anemia:  Hemoglobin 10.1 on 1/15  Continue to monitor 13. Vestibular dysfunction: N/V with dizzinessimproving. -treat supportively 14. HTN: BPstill labile---Monitor BP bid. Continue Avapro and Norvasc.  Hydralizine 25 TID started on 1/12, DC'd on 1/13  Renal ultrasound relative unremarkable  Informed by nursing regarding patient taking "herbs" from home.  Patient not to take any other alternative medication at present-discussed with patient.  Appreciate nephro recs-Lasix added, carvedilol added  Labile but improving on 1/16  Monitor with increased mobility 15. Hypoxia: Encourage IS.   Wean oxygen to off as tolerated. 16. Right CN VII palsy: Improving -eye  lubricants 17.  Hypoalbuminemia  Supplement initiated on 1/11 18.  Cloudy and foul urine  UA equivocal  Cont to monitor 19.  Oral blister  Oral gel ordered 20.  Hypokalemia  Potassium 3.6 on 1/16  Supplement initiated on 1/14   LOS: 6 days A FACE TO FACE EVALUATION WAS PERFORMED  Ankit Lorie Phenix 04/25/2018, 8:35 AM

## 2018-04-25 NOTE — Progress Notes (Signed)
Occupational Therapy Session Note  Patient Details  Name: Natalie Friedman MRN: [id number] Date of Birth: 12-17-1950  Today's Date: 04/25/2018 OT Individual Time: 3154-0086 OT Individual Time Calculation (min): 44 min    Short Term Goals: Week 1:  OT Short Term Goal 1 (Week 1): Pt will demonstrate improved sitting balance to bathe UB sitting EOB with S. OT Short Term Goal 2 (Week 1): Pt will demonstrate improved UE strength to be able to wash UB adequately with S. OT Short Term Goal 3 (Week 1): Pt will sit to stand to prepare for LB dressing with min - mod A. OT Short Term Goal 4 (Week 1): Pt will completed toilet transfer with mod A. OT Short Term Goal 5 (Week 1): Pt will demonstrate improved R hand coordination with being able to hold a utensil fully during eating.  Skilled Therapeutic Interventions/Progress Updates:    Pt completed functional transfer stand pivot to the mat from the wheelchair with max assist.  Increased weightshift/lean to the right in standing with decreased ability to shift weight and maintain to the left secondary to fear of falling.  With transition of sit to stand pt needs max instructional cueing for hand placement in order to push up from the surface she is sitting on.  She worked briefly on standing balance with lateral reaching to the left to promote weightshift with mod assist before needing to go to the restroom.  Returned back to the wheelchair with mod assist stand pivot to the left and returned to the room completing stand pivot again to the left to the toilet with mod assist.  She was able to manage her clothing and toilet hygiene at mod assist level.  Finished session with washing of hands at the sink with setup and transfer back to the bed with mod assist stand pivot using the RW.  Pt left with call button and phone in reach and bed alarm in place.    Therapy Documentation Precautions:  Precautions Precautions: Fall Precaution Comments: dizziness and nausea,  h/o falls, double vision Other Brace: glasses with opaque occlusion on right lens of glasses Restrictions Weight Bearing Restrictions: No   Vital Signs: Therapy Vitals Pulse Rate: 74 Resp: 18 BP: 120/62 Patient Position (if appropriate): Sitting Oxygen Therapy SpO2: 98 % O2 Device: Room Air Pain: Pain Assessment Pain Scale: Faces Pain Score: 0-No pain Faces Pain Scale: Hurts a little bit Pain Type: Acute pain Pain Location: Back Pain Orientation: Lower Pain Descriptors / Indicators: Discomfort Pain Onset: With Activity Pain Intervention(s): Repositioned;Emotional support ADL: See Care Tool Section for some details of ADL tasks  Therapy/Group: Individual Therapy  Evagelia Knack OTR/L 04/25/2018, 3:56 PM

## 2018-04-25 NOTE — Progress Notes (Addendum)
Occupational Therapy Session Note  Patient Details  Name: Natalie Friedman MRN: [id number] Date of Birth: 1951/03/26  Today's Date: 04/25/2018 OT Individual Time: 1104-1200 OT Individual Time Calculation (min): 56 min    Short Term Goals: Week 1:  OT Short Term Goal 1 (Week 1): Pt will demonstrate improved sitting balance to bathe UB sitting EOB with S. OT Short Term Goal 2 (Week 1): Pt will demonstrate improved UE strength to be able to wash UB adequately with S. OT Short Term Goal 3 (Week 1): Pt will sit to stand to prepare for LB dressing with min - mod A. OT Short Term Goal 4 (Week 1): Pt will completed toilet transfer with mod A. OT Short Term Goal 5 (Week 1): Pt will demonstrate improved R hand coordination with being able to hold a utensil fully during eating.  Skilled Therapeutic Interventions/Progress Updates:    Pt completed bathing and dressing sit to stand at the sink this session per her choice and also just finishing receiving iron.  She was able to complete UB bathing with supervision and LB bathing with overall min assist.  Pt needed max instructional cueing for sequencing secondary to decreased thoroughness and decreased awareness that she did not squeeze the washcloth out and it was dripping on the floor.  She was able to donn a pullover shirt with supervision and pull up pants with min assist.  Decreased standing endurance was noted with LB bathing as pt needed two attempts to complete the task secondary to fatigue.  Finished session with oral hygiene and brushing her teeth from the wheelchair level with setup and min instructional cueing to remove cap from toothpaste as she was trying to apply it with the cap on.  Pt left up in the wheelchair at end of session with call button and phone in reach and safety belt in place.    Therapy Documentation Precautions:  Precautions Precautions: Fall Precaution Comments: dizziness and nausea, h/o falls, double vision Other Brace: glasses  with opaque occlusion on right lens of glasses Restrictions Weight Bearing Restrictions: No  Pain: Pain Assessment Pain Scale: Faces Pain Score: 0-No pain Faces Pain Scale: No hurt ADL: See Care Tool Section for some details of ADL  Therapy/Group: Individual Therapy  Dalia Jollie OTR/L 04/25/2018, 12:25 PM

## 2018-04-25 NOTE — Progress Notes (Addendum)
Nutrition Follow Up  DOCUMENTATION CODES:   Obesity unspecified  INTERVENTION:    Continue Dysphagia 1-thin liquid diet; comments: Vegan (no meat, eggs or dairy products)  Nutritional Ambassador to take meal orders and obtain food preferences   [employer] Standard 1.0 cal formula PO BID, each supplement provides 325 kcal and 16 grams of protein  NUTRITION DIAGNOSIS:   Increased nutrient needs related to chronic illness(CHF) as evidenced by estimated needs, ongoing   GOAL:   Patient will meet greater than or equal to 90% of their needs, progressing  MONITOR:   PO intake, Supplement acceptance, Labs, Weight trends, Skin, I & O's, Diet advancement  ASSESSMENT:   68 y.o. female with history of OA, CKD4, T2DM, 2-3 years history of ataxia with difficulty walking, double vision, hearing loss and recent falls due to imbalance. 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 Zada Finders.  Post with right facial palsy and dysarthria.   Pt working with SLP/OT at time of visit; did not disturb. Spoke with Verdene Lennert, RN who spoke with pt's sister yesterday. Pt's sister expressed concern that pt is not receiving enough protein.   Verdene Lennert, RN also reported pt had received meat on her tray one day. Pharmacy currently out of [employer] plant-based nutrition supplement. RN spoke with Lawyer; they have ordered more.  PO intake 100% per flowsheet records. Labs & medications reviewed. CBG's [phone].  Diet Order:   Diet Order            DIET - DYS 1 Room service appropriate? Yes; Fluid consistency: Thin  Diet effective now             EDUCATION NEEDS:   Not appropriate for education at this time  Skin:  Skin Assessment: Skin Integrity Issues: Skin Integrity Issues:: Incisions Incisions: head R  Last BM:  1/16  Height:   Ht Readings from  Last 1 Encounters:  04/19/18 5\' 6"  (1.676 m)    Weight:   Wt Readings from Last 1 Encounters:  04/24/18 104 kg    Ideal Body Weight:  59 kg  BMI:  37.42 kg/m^2  Estimated Nutritional Needs:   Kcal:  1850-2050  Protein:  90-105 grams  Fluid:  >/= 1.8 L/day  Arthur Holms, RD, LDN Pager #: [phone] After-Hours Pager #: [phone]

## 2018-04-25 NOTE — Progress Notes (Signed)
Occupational Therapy Session Note  Patient Details  Name: Lorianne Malbrough MRN: [id number] Date of Birth: 1950/05/04  Today's Date: 04/25/2018 OT Individual Time: 1300-1330 OT Individual Time Calculation (min): 30 min   Short Term Goals: Week 1:  OT Short Term Goal 1 (Week 1): Pt will demonstrate improved sitting balance to bathe UB sitting EOB with S. OT Short Term Goal 2 (Week 1): Pt will demonstrate improved UE strength to be able to wash UB adequately with S. OT Short Term Goal 3 (Week 1): Pt will sit to stand to prepare for LB dressing with min - mod A. OT Short Term Goal 4 (Week 1): Pt will completed toilet transfer with mod A. OT Short Term Goal 5 (Week 1): Pt will demonstrate improved R hand coordination with being able to hold a utensil fully during eating.  Skilled Therapeutic Interventions/Progress Updates:    Pt greeted seated in wc and agreeable to OT treatment session. Pt completed B UE there-ex using orange theraband. 3 sets of 10 reps bicep curls, triceps press, and straight arm raises. Pt returned to room at end of session and left seated in wc with needs met and safety belt on.   Therapy Documentation Precautions:  Precautions Precautions: Fall Precaution Comments: dizziness and nausea, h/o falls, double vision Other Brace: glasses with opaque occlusion on right lens of glasses Restrictions Weight Bearing Restrictions: No Pain: Pain Assessment Pain Scale: Faces Pain Score: 0-No pain Faces Pain Scale: No hurt  Therapy/Group: Individual Therapy  Valma Cava 04/25/2018, 1:33 PM

## 2018-04-25 NOTE — Progress Notes (Signed)
Subjective: Interval History: has no complaint .  Objective: Vital signs in last 24 hours: Temp:  [98.1 F (36.7 C)-99.4 F (37.4 C)] 98.4 F (36.9 C) (01/16 2248) Pulse Rate:  [68-100] 100 (01/16 0632) Resp:  [16-18] 16 (01/16 0632) BP: (136-160)/(78-99) 144/80 (01/16 0632) SpO2:  [76 %-95 %] 93 % (01/16 2500) Weight:  [104 kg] 104 kg (01/15 1502) Weight change: 3.3 kg  Intake/Output from previous day: 01/15 0701 - 01/16 0700 In: 900 [P.O.:900] Out: -  Intake/Output this shift: No intake/output data recorded.  General appearance: alert, cooperative, no distress, moderately obese and pale Resp: diminished breath sounds bilaterally Cardio: S1, S2 normal and systolic murmur: systolic ejection 2/6, crescendo and decrescendo at 2nd left intercostal space GI: obese,pos bs,soft Extremities: edema 1+  Lab Results: Recent Labs    04/24/18 0519  WBC 8.1  HGB 10.1*  HCT 30.4*  PLT 203   BMET:  Recent Labs    04/24/18 0519 04/25/18 0707  NA 139 140  K 3.5 3.6  CL 98 95*  CO2 33* 33*  GLUCOSE 162* 165*  BUN 31* 36*  CREATININE 2.51* 2.71*  CALCIUM 9.4 9.6   Recent Labs    04/23/18 1420  PTH 25   Iron Studies:  Recent Labs    04/23/18 1420  IRON 25*  TIBC 168*    Studies/Results: No results found.  I have reviewed the patient's current medications.  Assessment/Plan: 1 HTN improving.  ?? Interaction of bp with herbs 2 CKD Cr ^ with bp control as expected 3 Anemia needs Fe 4 Brain tumor 5 DM controlled 6 obesity P cont bp meds, avoid herbs, iv Fe   LOS: 6 days   Jeneen Rinks Sahily Biddle 04/25/2018,9:41 AM

## 2018-04-25 NOTE — Progress Notes (Signed)
Speech Language Pathology Daily Session Note  Patient Details  Name: Natalie Friedman MRN: [id number] Date of Birth: [birth year]  Today's Date: 04/25/2018 SLP Individual Time: 1335-1402 SLP Individual Time Calculation (min): 27 min  Short Term Goals: Week 1: SLP Short Term Goal 1 (Week 1): Pt will demonstrate speech intelligibility strategies at sentence level with 80% intelligibility given min A verbal cues. SLP Short Term Goal 2 (Week 1): Pt will consume dys 1 and thin liquid via straw with min overt s/s aspiration and min A verbal cues for swallow strategies.  SLP Short Term Goal 3 (Week 1): Pt will consume trials of dys 2 textured foods with min overt s/s aspiration and min A verbal cues for swallow strategies.   Skilled Therapeutic Interventions:    Pt was seen for skilled ST targeting speech intelligibility goals.  SLP facilitated the session with a novel verbal description task targeting carryover of intelligibility strategies.  Pt needed min question cues to recall speech strategies that had been discussed from previous therapy sessions.  Pt was able to slow rate and increase vocal intensity to achieve intelligibility at the sentence level with min assist verbal cues.  Of note, therapist had initially planned to complete formal cognitive assessment on this date due to improved alertness over the last 2 therapy sessiosn; however, due to time constraints and increased lethargy this afternoon, will defer cognitive evaluation until pt is more alert.  Pt was left in wheelchair with chair alarm set and call bell within reach.    Pain Pain Assessment Pain Scale: 0-10 Pain Score: 0-No pain Faces Pain Scale: No hurt  Therapy/Group: Individual Therapy  Lewi Drost, Selinda Orion 04/25/2018, 2:03 PM

## 2018-04-25 NOTE — Plan of Care (Signed)
  Problem: RH BOWEL ELIMINATION Goal: RH STG MANAGE BOWEL WITH ASSISTANCE Description STG Manage Bowel with Assistance. mod  Outcome: Progressing   Problem: RH BLADDER ELIMINATION Goal: RH STG MANAGE BLADDER WITH ASSISTANCE Description STG Manage Bladder With Assistance mod  Outcome: Progressing   Problem: RH SKIN INTEGRITY Goal: RH STG SKIN FREE OF INFECTION/BREAKDOWN Description Remain free of skin breakdown and infection while on rehab with mod assist  Outcome: Progressing   Problem: RH SAFETY Goal: RH STG ADHERE TO SAFETY PRECAUTIONS W/ASSISTANCE/DEVICE Description STG Adhere to Safety Precautions With Assistance/Device. Mod  Outcome: Progressing   Problem: Consults Goal: Skin Care Protocol Initiated - if Braden Score 18 or less Description If consults are not indicated, leave blank or document N/A Outcome: Progressing

## 2018-04-25 NOTE — Patient Care Conference (Signed)
Inpatient RehabilitationTeam Conference and Plan of Care Update Date: 04/24/2018   Time: 2:45 PM    Patient Name: Natalie Friedman      Medical Record Number: [id number]  Date of Birth: 1950/09/28 Sex: Female         Room/Bed: 4M06C/4M06C-01 Payor Info: Payor: [employer] MEDICARE / Plan: [employer] MEDICARE / Product Type: *No Product type* /    Admitting Diagnosis: Tumor Rsxn  Admit Date/Time:  04/19/2018  2:34 PM Admission Comments: No comment available   Primary Diagnosis:  <principal problem not specified> Principal Problem: <principal problem not specified>  Patient Active Problem List   Diagnosis Date Noted  . Diabetes mellitus type 2 in obese ([place])   . Dysphagia   . CKD (chronic kidney disease)   . Hypokalemia   . Hypertensive crisis   . 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 Hypoalbuminemia due to protein-calorie malnutrition ([place])   . Essential hypertension   . Acute blood loss anemia   . Vestibular dysfunction of right ear   . Diabetes mellitus type 2 in nonobese ([facility])   . Foul smelling urine   . Blister   . Supplemental oxygen dependent   . Brain mass 04/19/2018  . Vestibular schwannoma ([place]) 04/16/2018  . Brain tumor ([place]) 04/16/2018  . Cellulitis and abscess of right leg   . 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 Primary osteoarthritis of right knee   . Nephrotic syndrome 05/15/2016  . CHF (congestive heart failure) ([place]) 05/12/2016  . DM (diabetes mellitus), type 2 with renal complications ([place]) 58/85/0277  . Hypertensive urgency 05/12/2016  . anasarca 05/12/2016  . Immobility 05/12/2016  . Proteinuria 05/12/2016  . Uncontrolled hypertension 08/19/2015    Expected Discharge Date: Expected Discharge Date: 05/10/18  Team Members Present: Physician leading conference: Dr. Delice Lesch Nurse Present: Ellison Carwin, LPN PT Present: Phylliss Bob, PTA;Victoria Sabra Heck, PT OT Present: Clyda Greener, OT SLP  Present: Windell Moulding, SLP PPS Coordinator present : Gunnar Fusi     Current Status/Progress Goal Weekly Team Focus  Medical   Functional deficits and truncal/limb ataxia secondary to vestibular schwannoma s/p crani/resection  Improve mobility, safety, BP, Cr, oxygen requirement, DM  See above   Bowel/Bladder   Pt is incontinent B/B. LBM 04/22/2018.  Encourage timed toileting. Maintain regular bowel pattern.  Assist with toileting needs PRN.   Swallow/Nutrition/ Hydration   dys 1, thin liquids; min assist verbal cues for use of swallowing precaution  supervision   carryover of swallowing strategies    ADL's   Pt currently needs min assist for UB bathing with min to mod for dressing.  Mod assist for LB bathing sit to stand with max assist for dressing with mod assist stand pivot transfers without an assistive device.    supervision level  selfcare retraining, transfer training, neuromuscular re-education, balance retraining, DME education   Mobility   Supervision assist with rollator, and CGA to close supervision assist without AD for ambulation. Supervision assist transfers. Mod I bed mobility   Supervision assist overall with LRAD. Mod I WC mobility.  Improved BP control. Increased activity tolerance. improved safety and high level balance.   Communication   mod-max assist for speech intelligibility   supervision  carryover of intelligibility strategies    Safety/Cognition/ Behavioral Observations  pending formal cognitive assessment per improved alertness, likely tomorrow (1/16)         Pain   No complaints of pain.  Remain pain free.  Assess pain Q shift and PRN.   Skin   Pt has an incision on R posterior head, ecchymosis to L arm, and MASD to groin bilat.  Treat skin issues according to orders.  Assess skin Q shift and PRN.     Rehab Goals Patient on target to meet rehab goals: Yes *See Care Plan and progress notes for long and short-term goals.     Barriers to Discharge   Current Status/Progress Possible Resolutions Date Resolved   Physician    Medical stability;Weight;New oxygen     See above  Therapies, Nephro recs for BP, follow labs, wean supplemental oxygen      Nursing                  PT                    OT                  SLP                SW                Discharge Planning/Teaching Needs:  Pt to d/c home with spouse who can provide 24/7 support.   Teaching to be planned with spouse prior to d/c.   Team Discussion:  MD notes to d/c O2;  AKI which may worsen with needed BP meds.  Experiencing center vertigo.  Intermittent caths needed Q 6hrs prn.  Mod max overall with therapies and CGA - min assist goals.  D1, thin due to oral weakness. Pt wanting to use herbal meds and MD will not allow at this time.  Pt very fearful of falling.  Need regular clothes.  Revisions to Treatment Plan:  None    Continued Need for Acute Rehabilitation Level of Care: The patient requires daily medical management by a physician with specialized training in physical medicine and rehabilitation for the following conditions: Daily direction of a multidisciplinary physical rehabilitation program to ensure safe treatment while eliciting the highest outcome that is of practical value to the patient.: Yes Daily medical management of patient stability for increased activity during participation in an intensive rehabilitation regime.: Yes Daily analysis of laboratory values and/or radiology reports with any subsequent need for medication adjustment of medical intervention for : Pulmonary problems;Neurological problems;Renal problems;Blood pressure problems   I attest that I was present, lead the team conference, and concur with the assessment and plan of the team.   Dorcas Melito 04/25/2018, 11:35 AM

## 2018-04-26 ENCOUNTER — Inpatient Hospital Stay (HOSPITAL_COMMUNITY): Payer: Medicare Other | Admitting: Physical Therapy

## 2018-04-26 ENCOUNTER — Inpatient Hospital Stay (HOSPITAL_COMMUNITY): Payer: Medicare Other

## 2018-04-26 LAB — RENAL FUNCTION PANEL
Albumin: 2.9 g/dL — ABNORMAL LOW (ref 3.5–5.0)
Anion gap: 12 (ref 5–15)
BUN: 36 mg/dL — ABNORMAL HIGH (ref 8–23)
CO2: 32 mmol/L (ref 22–32)
Calcium: 9.5 mg/dL (ref 8.9–10.3)
Chloride: 95 mmol/L — ABNORMAL LOW (ref 98–111)
Creatinine, Ser: 2.81 mg/dL — ABNORMAL HIGH (ref 0.44–1.00)
GFR calc Af Amer: 19 mL/min — ABNORMAL LOW (ref >60)
GFR calc non Af Amer: 16 mL/min — ABNORMAL LOW (ref >60)
Glucose, Bld: 95 mg/dL (ref 70–99)
Phosphorus: 3.6 mg/dL (ref 2.5–4.6)
Potassium: 3.3 mmol/L — ABNORMAL LOW (ref 3.5–5.1)
Sodium: 139 mmol/L (ref 135–145)

## 2018-04-26 LAB — GLUCOSE, CAPILLARY
Glucose-Capillary: 91 mg/dL (ref 70–99)
Glucose-Capillary: 173 mg/dL — ABNORMAL HIGH (ref 70–99)
Glucose-Capillary: 122 mg/dL — ABNORMAL HIGH (ref 70–99)
Glucose-Capillary: 133 mg/dL — ABNORMAL HIGH (ref 70–99)

## 2018-04-26 MED ORDER — POTASSIUM CHLORIDE CRYS ER 20 MEQ PO TBCR
40.0000 meq | EXTENDED_RELEASE_TABLET | Freq: Two times a day (BID) | ORAL | Status: DC
  Administered 2018-04-26 – 2018-05-01 (×11): 40 meq via ORAL
  Filled 2018-04-26 (×11): qty 2

## 2018-04-26 MED ORDER — POTASSIUM CHLORIDE CRYS ER 20 MEQ PO TBCR: 40.0000 meq | EXTENDED_RELEASE_TABLET | Freq: Every day | ORAL | Status: DC

## 2018-04-26 NOTE — Progress Notes (Signed)
Subjective: Interval History: has no complaint.  Objective: Vital signs in last 24 hours: Temp:  [97.9 F (36.6 C)] 97.9 F (36.6 C) (01/17 0526) Pulse Rate:  [68-74] 68 (01/17 0526) Resp:  [18] 18 (01/17 0526) BP: (120-147)/(62-82) 147/82 (01/17 0526) SpO2:  [93 %-98 %] 93 % (01/17 0526) Weight:  [104.9 kg] 104.9 kg (01/16 1502) Weight change: 0.9 kg  Intake/Output from previous day: 01/16 0701 - 01/17 0700 In: 582 [P.O.:582] Out: 1750 [Urine:1750] Intake/Output this shift: Total I/O In: 200 [P.O.:200] Out: -   General appearance: alert, cooperative, moderately obese and R facial Resp: clear to auscultation bilaterally Cardio: S1, S2 normal and systolic murmur: early systolic 2/6, crescendo and decrescendo at 2nd left intercostal space GI: obese, pos bs Extremities: edema 1+  Lab Results: Recent Labs    04/24/18 0519  WBC 8.1  HGB 10.1*  HCT 30.4*  PLT 203   BMET:  Recent Labs    04/25/18 0707 04/26/18 0550  NA 140 139  K 3.6 3.3*  CL 95* 95*  CO2 33* 32  GLUCOSE 165* 95  BUN 36* 36*  CREATININE 2.71* 2.81*  CALCIUM 9.6 9.5   Recent Labs    04/23/18 1420  PTH 25   Iron Studies:  Recent Labs    04/23/18 1420  IRON 25*  TIBC 168*    Studies/Results: No results found.  I have reviewed the patient's current medications.  Assessment/Plan: 1 CKD 4 slowing rise . Caused by bp control 2 HTN improving, not at goal 3 Obesity 4 Brain tumor resx 5 Debill 6 Anemia given Fe P bp meds, follow Cr, bps    LOS: 7 days   Jeneen Rinks Osias Resnick 04/26/2018,8:44 AM

## 2018-04-26 NOTE — Progress Notes (Signed)
[facility] PHYSICAL MEDICINE & REHABILITATION PROGRESS NOTE  Subjective/Complaints: Patient seen sitting up in her chair this morning.  She states she slept well overnight.  Her blood pressures have improved off of the above medications.  She requests reviewing of all of her lab work, which was done with patient.  She is a little frustrated with her breakfast this morning.  She requests oatmeal and berries for breakfast which she is very particular about at home.  Informed patient she would be able to bring in food from outside and have it reviewed by nursing.  ROS: Denies CP, shortness of breath, nausea, vomiting, diarrhea.  Objective: Vital Signs: Blood pressure (!) 147/82, pulse 68, temperature 97.9 F (36.6 C), temperature source Oral, resp. rate 18, height 5\' 6"  (1.676 m), weight 104.9 kg, SpO2 93 %. No results found. Recent Labs    04/24/18 0519  WBC 8.1  HGB 10.1*  HCT 30.4*  PLT 203   Recent Labs    04/25/18 0707 04/26/18 0550  NA 140 139  K 3.6 3.3*  CL 95* 95*  CO2 33* 32  GLUCOSE 165* 95  BUN 36* 36*  CREATININE 2.71* 2.81*  CALCIUM 9.6 9.5    Physical Exam: BP (!) 147/82 (BP Location: Left Arm)   Pulse 68   Temp 97.9 F (36.6 C) (Oral)   Resp 18   Ht 5\' 6"  (1.676 m)   Wt 104.9 kg   SpO2 93%   BMI 37.33 kg/m  Constitutional: No distress . Vital signs reviewed.  Obese HENT: Right craniotomy C/D/I Eyes: EOMI. No discharge. Cardiovascular: RRR.  No JVD. Respiratory: CTA bilaterally.  Normal effort.   GI: BS +. Non-distended. Musc: Lower extremity edema. Neurological: Alert Right facial weakness, unchanged Dysarthria Motor: 45 prox to distal throughout, left stronger than right, unchanged.   Skin: Skin iswarm.Crani incision CDI Psychiatric: She has a normal mood and affect. Herbehavior is normal.  Assessment/Plan: 1. Functional deficits secondary to vestibular schwannoma which require 3+ hours per day of interdisciplinary therapy in a  comprehensive inpatient rehab setting.  Physiatrist is providing close team supervision and 24 hour management of active medical problems listed below.  Physiatrist and rehab team continue to assess barriers to discharge/monitor patient progress toward functional and medical goals  Care Tool:  Bathing    Body parts bathed by patient: Right arm, Left arm, Chest, Abdomen, Face, Right upper leg, Left upper leg, Right lower leg, Left lower leg, Front perineal area, Buttocks   Body parts bathed by helper: Front perineal area, Buttocks     Bathing assist Assist Level: Minimal Assistance - Patient > 75%     Upper Body Dressing/Undressing Upper body dressing   What is the patient wearing?: Pull over shirt    Upper body assist Assist Level: Supervision/Verbal cueing    Lower Body Dressing/Undressing Lower body dressing      What is the patient wearing?: Pants     Lower body assist Assist for lower body dressing: Minimal Assistance - Patient > 75%     Toileting Toileting    Toileting assist Assist for toileting: Maximal Assistance - Patient 25 - 49%     Transfers Chair/bed transfer  Transfers assist     Chair/bed transfer assist level: Moderate Assistance - Patient 50 - 74%     Locomotion Ambulation   Ambulation assist      Assist level: Moderate Assistance - Patient 50 - 74% Assistive device: Parallel bars Max distance: 2   Walk 10 feet  activity   Assist  Walk 10 feet activity did not occur: Safety/medical concerns        Walk 50 feet activity   Assist Walk 50 feet with 2 turns activity did not occur: Safety/medical concerns         Walk 150 feet activity   Assist Walk 150 feet activity did not occur: Safety/medical concerns         Walk 10 feet on uneven surface  activity   Assist Walk 10 feet on uneven surfaces activity did not occur: Safety/medical concerns         Wheelchair     Assist Will patient use wheelchair at  discharge?: Yes Type of Wheelchair: Manual    Wheelchair assist level: Minimal Assistance - Patient > 75% Max wheelchair distance: 22ft    Wheelchair 50 feet with 2 turns activity    Assist    Wheelchair 50 feet with 2 turns activity did not occur: Safety/medical concerns   Assist Level: Minimal Assistance - Patient > 75%   Wheelchair 150 feet activity     Assist Wheelchair 150 feet activity did not occur: Safety/medical concerns          Medical Problem List and Plan: 1.Functional deficits and truncal/limb ataxiasecondary to vestibular schwannoma s/p crani/resection  Cont CIR 2. DVT Prophylaxis/Anticoagulation: Pharmaceutical:Lovenox 3.Chronic back pain/Pain Management:Tylenol prn 4. Mood:LCSW to follow for evaluation and support. 5. Neuropsych: This patientiscapable of making decisions on herown behalf. 6. Skin/Wound Care:Routine pressure relief measures. Monitor wound for healing. 7. Fluids/Electrolytes/Nutrition:Monitor I/O.   D1 thins, advance diet as tolerated 8. T2DM:Hgb A1c- 6.5.Monitor BS ac/hs. Use SSI for elevated BS--is on very limited diet.  Amaryl 1 mg started on 1/15  Labile on 1/17, monitor for trend  Monitor with increased mobility 9. Acute on chronic renal disease: Baseline SCr- 2.4--->2.54.  Patient states she was told she would need dialysis at the age of 10, but has been managing with diet control.  Creatinine 2.81 on 1/17  Expect worsening of kidney function with changes to blood pressure medications.  Continue to monitor 10. Occult spina bifida with neuropathy: Managed with OTC supplements. 11. Leucocytosis: Monitor for signs of infection. 12. Acute blood loss anemia:  Hemoglobin 10.1 on 1/15  Continue to monitor 13. Vestibular dysfunction: N/V with dizzinessimproving. -treat supportively 14. HTN: BPstill labile---Monitor BP bid. Continue Avapro and Norvasc.  Hydralizine 25 TID started on 1/12,  DC'd on 1/13  Renal ultrasound relative unremarkable  Informed by nursing regarding patient taking "herbs" from home.  Patient not to take any alternative medication at present-discussed with patient.  Appreciate nephro recs-Lasix added, carvedilol added  Labile but improving on 1/17  Monitor with increased mobility 15. Hypoxia: Encourage IS.   Weaned oxygen to off as tolerated. 16. Right CN VII palsy: Improving -eye lubricants 17.  Hypoalbuminemia  Supplement initiated on 1/11 18.  Cloudy and foul urine  UA equivocal  Cont to monitor 19.  Oral blister  Oral gel ordered 20.  Hypokalemia  Potassium 3.3 on 1/17  Supplement initiated on 1/14, increased on 1/17   LOS: 7 days A FACE TO FACE EVALUATION WAS PERFORMED  Ankit Lorie Phenix 04/26/2018, 8:52 AM

## 2018-04-26 NOTE — Progress Notes (Signed)
Physical Therapy Weekly Progress Note  Patient Details  Name: Natalie Friedman MRN: [id number] Date of Birth: Aug 23, 1950  Beginning of progress report period: April 20, 2018 End of progress report period: April 26, 2018  Today's Date: 04/26/2018 PT Individual Time: 1100-1200 and 1415-1445 PT Individual Time Calculation (min): 60 minand 30 min    Patient has met 3 of 5 short term goals.  Pt making steady progress towards LTG.   Patient continues to demonstrate the following deficits muscle weakness, decreased cardiorespiratoy endurance, impaired timing and sequencing, unbalanced muscle activation, motor apraxia and decreased coordination, decreased attention to right and decreased standing balance, decreased postural control, hemiplegia and decreased balance strategies and therefore will continue to benefit from skilled PT intervention to increase functional independence with mobility.  Patient progressing toward long term goals..  Continue plan of care.  PT Short Term Goals Week 1:  PT Short Term Goal 1 (Week 1): Pt will increase bed mobility to c/g. PT Short Term Goal 1 - Progress (Week 1): Met PT Short Term Goal 2 (Week 1): Pt will increase transfers to min A.  PT Short Term Goal 2 - Progress (Week 1): Met PT Short Term Goal 3 (Week 1): Pt will ambulate with LRAD about 50 feet PT Short Term Goal 3 - Progress (Week 1): Progressing toward goal PT Short Term Goal 4 (Week 1): Pt will ascend/descend 4 stairs with B rails. PT Short Term Goal 4 - Progress (Week 1): Progressing toward goal PT Short Term Goal 5 (Week 1): Pt will propel w/c about 100 feet with S.  PT Short Term Goal 5 - Progress (Week 1): Met Week 2:  PT Short Term Goal 1 (Week 2): Pt will perform bed mobility with supervision assist consistently  PT Short Term Goal 2 (Week 2): Pt will perform bed<>WC transfers with Supervision assist  PT Short Term Goal 3 (Week 2): Pt will ambulate 58f with min assist from PT and LRAD  PT  Short Term Goal 4 (Week 2): Pt will ascend 4 steps with BUE support and min assist from PT   Skilled Therapeutic Interventions/Progress Updates:  Session 1  Pt received supine in bed and agreeable to PT. Supine>sit transfer with supervision assist and min cues improved use of bed features.  Stand pivot transfer to the L with min assist from PT using RW. Min cues from PT for safety and gait pattern.   Gait training with RW and min assist x 25 ft, moderate cues for gait pattern, step length, and AD management to prevent anteior LOB. Stand to sit EOB with min-mod assist from PT due to uncontrolled descent.   Bed mobililty. Supine NMR: SAQ, heel slides, bridges, SLR with AAROM on the RLE, hip abduction with level 2 tband, reciprocal marches with level 2 tband. PNF stabilizing reversals pelvic rotation. PNT dynamic reversals for pelvic rotation. Min cues from PT for full ROM and proper speed of movement. Each completed x 12 BLE.  Stand pivot transfer to the R with min assist overall from PT and moderate cues for gait pattern  Patient returned to [facility] and [facility] reach and all needs met.    Session 2.   Pt received sitting in WC and agreeable to PT. Pt transported to day room. Nustep reciprocal movement and endurance training 5 min +4 min with prolonged reste break between bouts. Gait training with RW x 149fwith min-mod assist from PT for safety with turn to sit in  WC.   Pt returned to room and performed stand pivot transfer to bed with min assist and RW. Sit>supine completed with supervision assist from PT, and left supine in bed with call bell in reach and all needs met.        Therapy Documentation Precautions:  Precautions Precautions: Fall Precaution Comments: dizziness and nausea, h/o falls, double vision Other Brace: glasses with opaque occlusion on right lens of glasses Restrictions Weight Bearing Restrictions: No   Pain: Session 1 :   Denies Session 2: Pain Assessment Pain Scale: Faces Faces Pain Scale: Hurts a little bit Pain Type: Acute pain Pain Location: Back Pain Orientation: Lower Pain Descriptors / Indicators: Discomfort Pain Onset: On-going Patients Stated Pain Goal: 2 Pain Intervention(s): Repositioned;Emotional support   Therapy/Group: Individual Therapy  Lorie Phenix 04/26/2018, 11:59 AM

## 2018-04-26 NOTE — Progress Notes (Signed)
Occupational Therapy Session Note  Patient Details  Name: Natalie Friedman MRN: [id number] Date of Birth: July 13, 1950  Today's Date: 04/26/2018 OT Individual Time: 1300-1340 OT Individual Time Calculation (min): 40 min    Short Term Goals: Week 2:  OT Short Term Goal 1 (Week 2): Pt will transfer to toilet wiht MIN A and LRAD OT Short Term Goal 2 (Week 2): Pt will advance pant past hips wiht S  OT Short Term Goal 3 (Week 2): Pt will complete 3/3 components of toileting wiht CGA OT Short Term Goal 4 (Week 2): Pt will wash 50% of body with RUE to demo improve control and strength in RUE  Skilled Therapeutic Interventions/Progress Updates:    1;1. Pt eats lunch seated in w/c with VC for finger sweep and smaller bites 50% of time. Pt completes oral care with VC for taking off toothpaste cap prior to tring to squeeze on tooth brush. Pt uses suction to get remainder of toothpaste out of mouth. In tx gym, pt completes 9HPT RUE: 1 min 20 sec, LUE: 48.8 seconds and Box and blocks assessment RUE:14 LUE:29 which further demonstrates RUE FMC/desterity deficits. Exited session with pt seated in w/c, call light in reach and all needs met  Therapy Documentation Precautions:  Precautions Precautions: Fall Precaution Comments: dizziness and nausea, h/o falls, double vision Other Brace: glasses with opaque occlusion on right lens of glasses Restrictions Weight Bearing Restrictions: No General:   Vital Signs:  Pain: Pain Assessment Pain Scale: 0-10 Pain Score: 0-No pain ADL: ADL Eating: Minimal assistance Grooming: Moderate assistance Upper Body Bathing: Minimal assistance Lower Body Bathing: Moderate assistance Upper Body Dressing: Minimal assistance Lower Body Dressing: Moderate assistance Vision   Perception    Praxis   Exercises:   Other Treatments:     Therapy/Group: Individual Therapy  Tonny Branch 04/26/2018, 1:42 PM

## 2018-04-26 NOTE — Progress Notes (Signed)
Occupational Therapy Weekly Progress Note  Patient Details  Name: Natalie Friedman MRN: [id number] Date of Birth: 04-10-1951  Beginning of progress report period: April 20, 2018 End of progress report period: April 26, 2018  Today's Date: 04/26/2018 OT Individual Time: 2706-2376 OT Individual Time Calculation (min): 53 min    Patient has met 5 of 5 short term goals.  PT has made steady progress this reporting period improving from max A squat pivots at eval to S sit to stand in stedy and min A stand pivot with RW. Pt is able to completes UB dressing with S and LB dressing with min A overall. Pt coninues to be limited by anxiety during functional mobility as well as decreased endurance impacting function.  Patient continues to demonstrate the following deficits: muscle weakness, decreased cardiorespiratoy endurance, impaired timing and sequencing, motor apraxia, decreased coordination and decreased motor planning, decreased visual perceptual skills, decreased attention, decreased awareness, decreased problem solving, decreased safety awareness, decreased memory and delayed processing and decreased sitting balance, decreased standing balance, decreased postural control, hemiplegia and decreased balance strategies and therefore will continue to benefit from skilled OT intervention to enhance overall performance with BADL.  Patient progressing toward long term goals..  Continue plan of care.  OT Short Term Goals Week 1:  OT Short Term Goal 1 (Week 1): Pt will demonstrate improved sitting balance to bathe UB sitting EOB with S. OT Short Term Goal 1 - Progress (Week 1): Met OT Short Term Goal 2 (Week 1): Pt will demonstrate improved UE strength to be able to wash UB adequately with S. OT Short Term Goal 2 - Progress (Week 1): Met OT Short Term Goal 3 (Week 1): Pt will sit to stand to prepare for LB dressing with min - mod A. OT Short Term Goal 3 - Progress (Week 1): Met OT Short Term Goal 4 (Week  1): Pt will completed toilet transfer with mod A. OT Short Term Goal 4 - Progress (Week 1): Met OT Short Term Goal 5 (Week 1): Pt will demonstrate improved R hand coordination with being able to hold a utensil fully during eating. OT Short Term Goal 5 - Progress (Week 1): Met Week 2:  OT Short Term Goal 1 (Week 2): Pt will transfer to toilet wiht MIN A and LRAD OT Short Term Goal 2 (Week 2): Pt will advance pant past hips wiht S  OT Short Term Goal 3 (Week 2): Pt will complete 3/3 components of toileting wiht CGA OT Short Term Goal 4 (Week 2): Pt will wash 50% of body with RUE to demo improve control and strength in RUE  Skilled Therapeutic Interventions/Progress Updates:    1:1. Pt received seated in w/c ending speech therapy session. Pt with pain reported in back. RN alerted and delivers pain medication. Rest PRN provided. Pt agreeable to shower. Pt transfers into shower in stedy with S for sit to stand in steady. Pt completes bathing at seated level with lateral leans to wash buttocks. Pt requires VC for sequencing bathing body parts. Pt crosses into figure 4 to wash feet. Pt dresses with S at sink for UB seated and MIN A for advancing breif past hips in standing at sink. OT dons teds and pt crosses into figure 4 with VC to thread BLE into pants as well as don socks. Exited session with pt seated in w/c, belt alarm on, call light inreach and RN in room.   Therapy Documentation Precautions:  Precautions Precautions: Fall Precaution Comments: dizziness  and nausea, h/o falls, double vision Other Brace: glasses with opaque occlusion on right lens of glasses Restrictions Weight Bearing Restrictions: No General:   Vital Signs:  Pain:   ADL: ADL Eating: Minimal assistance Grooming: Moderate assistance Upper Body Bathing: Minimal assistance Lower Body Bathing: Moderate assistance Upper Body Dressing: Minimal assistance Lower Body Dressing: Moderate assistance Vision   Perception     Praxis   Exercises:   Other Treatments:     Therapy/Group: Individual Therapy  Tonny Branch 04/26/2018, 9:56 AM

## 2018-04-26 NOTE — Plan of Care (Signed)
  Problem: RH BOWEL ELIMINATION Goal: RH STG MANAGE BOWEL WITH ASSISTANCE Description STG Manage Bowel with Assistance. mod  Outcome: Progressing   Problem: RH BLADDER ELIMINATION Goal: RH STG MANAGE BLADDER WITH ASSISTANCE Description STG Manage Bladder With Assistance mod  Outcome: Progressing   Problem: RH SKIN INTEGRITY Goal: RH STG SKIN FREE OF INFECTION/BREAKDOWN Description Remain free of skin breakdown and infection while on rehab with mod assist  Outcome: Progressing   Problem: RH SAFETY Goal: RH STG ADHERE TO SAFETY PRECAUTIONS W/ASSISTANCE/DEVICE Description STG Adhere to Safety Precautions With Assistance/Device. Mod  Outcome: Progressing   Problem: Consults Goal: Skin Care Protocol Initiated - if Braden Score 18 or less Description If consults are not indicated, leave blank or document N/A Outcome: Progressing

## 2018-04-26 NOTE — Progress Notes (Signed)
Speech Language Pathology Weekly Progress and Session Note  Patient Details  Name: Natalie Friedman MRN: [id number] Date of Birth: 1950/11/06  Beginning of progress report period: April 19, 2018 End of progress report period: April 26, 2018  Today's Date: 04/26/2018 SLP Individual Time: 3810-1751 SLP Individual Time Calculation (min): 24 min  Short Term Goals: Week 1: SLP Short Term Goal 1 (Week 1): Pt will demonstrate speech intelligibility strategies at sentence level with 80% intelligibility given min A verbal cues. SLP Short Term Goal 1 - Progress (Week 1): Met SLP Short Term Goal 2 (Week 1): Pt will consume dys 1 and thin liquid via straw with min overt s/s aspiration and min A verbal cues for swallow strategies.  SLP Short Term Goal 2 - Progress (Week 1): Met SLP Short Term Goal 3 (Week 1): Pt will consume trials of dys 2 textured foods with min overt s/s aspiration and min A verbal cues for swallow strategies.  SLP Short Term Goal 3 - Progress (Week 1): Other (comment)(not addressed this reporting period due to ongoing need for min cues to tolerate current diet)    New Short Term Goals: Week 2: SLP Short Term Goal 1 (Week 2): Pt will demonstrate speech intelligibility strategies at sentence level with 80% intelligibility given supervision verbal cues. SLP Short Term Goal 2 (Week 2): Pt will consume dys 1 and thin liquid via straw with min overt s/s aspiration and supervision verbal cues for swallow strategies.  SLP Short Term Goal 3 (Week 2): Pt will consume trials of dys 2 textured foods with min overt s/s aspiration and min A verbal cues for swallow strategies.  SLP Short Term Goal 4 (Week 2): Pt will complete semi-complex functional tasks with supervision for functional problem solving.   SLP Short Term Goal 5 (Week 2): Pt will use external aids to recall semi-complex daily information with supervision.    Weekly Progress Updates:   Pt has made functional gains this reporting  period and has met 2 out of 2 targeted short term goals.  Pt is currently min assist for tasks due to moderate dysarthria and dysphagia resulting from oral motor weakness.  Pt has demonstrated improved use of compensatory swallowing and speech intelligibility strategies.  Pt is consuming dys 1, thin liquids diet with min cues for use of swallowing precautions.  Pt and family education is ongoing.  Pt would continue to benefit from skilled ST while inpatient in order to maximize functional independence and reduce burden of care prior to discharge.  Anticipate that pt will need 24/7 supervision at discharge in addition to [place] follow up at next level of care.    Intensity: Minumum of 1-2 x/day, 30 to 90 minutes Frequency: 3 to 5 out of 7 days Duration/Length of Stay: 13-19 days  Treatment/Interventions: English as a second language teacher;Dysphagia/aspiration precaution training;Patient/family education;Speech/Language facilitation;Cognitive remediation/compensation;Functional tasks;Environmental controls;Internal/external aids   Daily Session  Skilled Therapeutic Interventions: Pt was seen for skilled ST targeting ongoing diagnostic treatment of cognition.  Pt much more alert today and SLP administered portions of the Cognistat.  Pt scored WFL on all targeted subtests (memory registration, delayed recall, attention, visuospatial/constructional problem solving, reasoning, and judgement) with the exception of math calculation subtests.  Pt verbalizes feeling "slower" than normal; therefore, will initiate cognitive goals as part of treatment plan.  Goals updated on this date to reflect current progress and plan of care.     General    Pain Pain Assessment Pain Scale: 0-10 Pain Score: 0-No pain  Therapy/Group: Individual  Therapy  Kajsa Butrum, Selinda Orion 04/26/2018, 12:32 PM

## 2018-04-26 NOTE — Care Management (Signed)
Inpatient [facility] Individual Statement of Services  Patient Name:  Natalie Friedman  Date:  04/26/2018  Welcome to the [facility].  Our goal is to provide you with an individualized program based on your diagnosis and situation, designed to meet your specific needs.  With this comprehensive rehabilitation program, you will be expected to participate in at least 3 hours of rehabilitation therapies Monday-Friday, with modified therapy programming on the weekends.  Your rehabilitation program will include the following services:  Physical Therapy (PT), Occupational Therapy (OT), Speech Therapy (ST), 24 hour per day rehabilitation nursing, Therapeutic Recreaction (TR), Neuropsychology, Case Management (Social Worker), Rehabilitation Medicine, Nutrition Services and Pharmacy Services  Weekly team conferences will be held on Wednesdays to discuss your progress.  Your Social Worker will talk with you frequently to get your input and to update you on team discussions.  Team conferences with you and your family in attendance may also be held.  Expected length of stay: 16-18 days   Overall anticipated outcome: supervision - minimal assist  Depending on your progress and recovery, your program may change. Your Social Worker will coordinate services and will keep you informed of any changes. Your Social Worker's name and contact numbers are listed  below.  The following services may also be recommended but are not provided by the [facility] will be made to provide these services after discharge if needed.  Arrangements include referral to agencies that provide these services.  Your insurance has been verified to be:  [facility] Medicare; Medicaid Your primary doctor is:  Dr. Nevada Crane  Pertinent information will be shared with your doctor and your  insurance company.  Social Worker:  [place], [place] or ([phone]   Information discussed with and copy given to patient by: Lennart Pall, 04/26/2018, 8:59 AM

## 2018-04-27 ENCOUNTER — Inpatient Hospital Stay (HOSPITAL_COMMUNITY): Payer: Medicare Other | Admitting: Physical Therapy

## 2018-04-27 DIAGNOSIS — R1312 Dysphagia, oropharyngeal phase: Secondary | ICD-10-CM

## 2018-04-27 LAB — RENAL FUNCTION PANEL
Albumin: 2.7 g/dL — ABNORMAL LOW (ref 3.5–5.0)
Anion gap: 9 (ref 5–15)
BUN: 32 mg/dL — AB (ref 8–23)
CO2: 34 mmol/L — ABNORMAL HIGH (ref 22–32)
Calcium: 9.3 mg/dL (ref 8.9–10.3)
Chloride: 95 mmol/L — ABNORMAL LOW (ref 98–111)
Creatinine, Ser: 2.53 mg/dL — ABNORMAL HIGH (ref 0.44–1.00)
GFR calc Af Amer: 22 mL/min — ABNORMAL LOW (ref >60)
GFR calc non Af Amer: 19 mL/min — ABNORMAL LOW (ref >60)
Glucose, Bld: 96 mg/dL (ref 70–99)
Phosphorus: 3.2 mg/dL (ref 2.5–4.6)
Potassium: 3.9 mmol/L (ref 3.5–5.1)
SODIUM: 138 mmol/L (ref 135–145)

## 2018-04-27 LAB — GLUCOSE, CAPILLARY
Glucose-Capillary: 96 mg/dL (ref 70–99)
Glucose-Capillary: 164 mg/dL — ABNORMAL HIGH (ref 70–99)
Glucose-Capillary: 83 mg/dL (ref 70–99)
Glucose-Capillary: 121 mg/dL — ABNORMAL HIGH (ref 70–99)

## 2018-04-27 MED ORDER — CARVEDILOL 25 MG PO TABS
25.0000 mg | ORAL_TABLET | Freq: Two times a day (BID) | ORAL | Status: DC
  Administered 2018-04-27 – 2018-05-09 (×25): 25 mg via ORAL
  Filled 2018-04-27 (×25): qty 1

## 2018-04-27 NOTE — Progress Notes (Signed)
Subjective: Interval History: has complaints , here, slowly better.  Objective: Vital signs in last 24 hours: Temp:  [98.2 F (36.8 C)-98.9 F (37.2 C)] 98.5 F (36.9 C) (01/18 0511) Pulse Rate:  [70-88] 88 (01/18 0511) Resp:  [16-18] 16 (01/18 0511) BP: (157-161)/(77-84) 161/77 (01/18 0511) SpO2:  [90 %-95 %] 90 % (01/18 0511) Weight:  [100 kg] 100 kg (01/18 0511) Weight change: -4.9 kg  Intake/Output from previous day: 01/17 0701 - 01/18 0700 In: 800 [P.O.:800] Out: 2050 [Urine:2050] Intake/Output this shift: Total I/O In: 240 [P.O.:240] Out: -   General appearance: alert, cooperative, no distress and pale Resp: diminished breath sounds bilaterally Cardio: S1, S2 normal and systolic murmur: systolic ejection 2/6, crescendo and decrescendo at 2nd left intercostal space GI: obese, pos , soft Extremities: edema 1+  Lab Results: No results for input(s): WBC, HGB, HCT, PLT in the last 72 hours. BMET:  Recent Labs    04/26/18 0550 04/27/18 0620  NA 139 138  K 3.3* 3.9  CL 95* 95*  CO2 32 34*  GLUCOSE 95 96  BUN 36* 32*  CREATININE 2.81* 2.53*  CALCIUM 9.5 9.3   No results for input(s): PTH in the last 72 hours. Iron Studies: No results for input(s): IRON, TIBC, TRANSFERRIN, FERRITIN in the last 72 hours.  Studies/Results: No results found.  I have reviewed the patient's current medications.  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

## 2018-04-27 NOTE — Plan of Care (Signed)
  Problem: RH BOWEL ELIMINATION Goal: RH STG MANAGE BOWEL WITH ASSISTANCE Description STG Manage Bowel with Assistance. mod  Outcome: Progressing   Problem: RH BLADDER ELIMINATION Goal: RH STG MANAGE BLADDER WITH ASSISTANCE Description STG Manage Bladder With Assistance mod  Outcome: Progressing   Problem: RH SKIN INTEGRITY Goal: RH STG SKIN FREE OF INFECTION/BREAKDOWN Description Remain free of skin breakdown and infection while on rehab with mod assist  Outcome: Progressing   Problem: RH SAFETY Goal: RH STG ADHERE TO SAFETY PRECAUTIONS W/ASSISTANCE/DEVICE Description STG Adhere to Safety Precautions With Assistance/Device. Mod  Outcome: Progressing   Problem: Consults Goal: Skin Care Protocol Initiated - if Braden Score 18 or less Description If consults are not indicated, leave blank or document N/A Outcome: Progressing

## 2018-04-27 NOTE — Progress Notes (Signed)
Physical Therapy Session Note  Patient Details  Name: Natalie Friedman MRN: [id number] Date of Birth: 11-30-1950  Today's Date: 04/27/2018 PT Individual Time: 1020-1045 PT Individual Time Calculation (min): 25 min   Short Term Goals: Week 2:  PT Short Term Goal 1 (Week 2): Pt will perform bed mobility with supervision assist consistently  PT Short Term Goal 2 (Week 2): Pt will perform bed<>WC transfers with Supervision assist  PT Short Term Goal 3 (Week 2): Pt will ambulate 22f with min assist from PT and LRAD  PT Short Term Goal 4 (Week 2): Pt will ascend 4 steps with BUE support and min assist from PT   Skilled Therapeutic Interventions/Progress Updates:  Session 1 Pt received supine in bed and agreeable to bed level PT. PT instructed pt in supine NMR. Bridges x 6 with 3 sec hold. isometric Hip adduction to squeeze ball x 8. Pt reports increased Nausea following BLE NMR. Bed mobility for rolling R and L for improved positioning as well as sc[facility] to [facility]with bridge technique and min-supervision assist from PT for improved use of RLE and RUE. Pt noted to have emesis episode and returned to semirecumbent position. Cold rag placed on back of neck. And left supine in bed with call bell in reach and all needs met. RN aware of emesis.    Session 2.  Attempted to see pt for additional treatment. Pt reports that she has continued to be extremely nauseous throughout the day without any relief. Pt requests to hold therapy for the rest of the day. PT edcuated pt on benefits of continued physical activity on long term energy levels, but pt states that she is too nauseous for any PT at this time. Pt left in bed with call bell in reach and all needs met.    Therapy Documentation Precautions:  Precautions Precautions: Fall Precaution Comments: dizziness and nausea, h/o falls, double vision Other Brace: glasses with opaque occlusion on right lens of glasses Restrictions Weight Bearing Restrictions:  No General: PT Amount of Missed Time (min): 20 Minutes and 45 min  PT Missed Treatment Reason: Patient ill (Comment)(n/v) Pain:   8/10 L side of head. RN aware. Pt declined medication.     Therapy/Group: Individual Therapy  ALorie Phenix1/18/2020, 10:44 AM

## 2018-04-27 NOTE — Progress Notes (Signed)
PHYSICAL MEDICINE & REHABILITATION PROGRESS NOTE  Subjective/Complaints: Up with nursing getting cleaned up.  Having discomfort and irritation in her right eye.   ROS: Patient denies fever, rash, sore throat, blurred vision, nausea, vomiting, diarrhea, cough, shortness of breath or chest pain, joint or back pain, headache, or mood change.    Objective: Vital Signs: Blood pressure (!) 161/77, pulse 88, temperature 98.5 F (36.9 C), temperature source Oral, resp. rate 16, height 5\' 6"  (1.676 m), weight 100 kg, SpO2 90 %. No results found. No results for input(s): WBC, HGB, HCT, PLT in the last 72 hours. Recent Labs    04/26/18 0550 04/27/18 0620  NA 139 138  K 3.3* 3.9  CL 95* 95*  CO2 32 34*  GLUCOSE 95 96  BUN 36* 32*  CREATININE 2.81* 2.53*  CALCIUM 9.5 9.3    Physical Exam: BP (!) 161/77 (BP Location: Right Arm)   Pulse 88   Temp 98.5 F (36.9 C) (Oral)   Resp 16   Ht 5\' 6"  (1.676 m)   Wt 100 kg   SpO2 90%   BMI 35.58 kg/m  Constitutional: No distress . Vital signs reviewed. HEENT: EOMI, oral membranes moist.  Right craniotomy site clean dry and intact.  Right sclera irritated  neck: supple Cardiovascular: RRR without murmur. No JVD    Respiratory: CTA Bilaterally without wheezes or rales. Normal effort    GI: BS +, non-tender, non-distended  Musc: Lower extremity edema. Neurological: Alert Right facial weakness, unchanged Dysarthria but improving phonation Motor: 45 prox to distal throughout, left stronger than right, stable   Skin: Skin iswarm.Crani incision CDI Psychiatric: She has a normal mood and affect. Herbehavior is normal.  Assessment/Plan: 1. 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  Physiatrist is providing close team supervision and 24 hour management of active medical problems listed below.  Physiatrist and  rehab team continue to assess barriers to discharge/monitor patient progress toward functional and medical goals  Care Tool:  Bathing    Body parts bathed by patient: Right arm, Left arm, Chest, Abdomen, Face, Right upper leg, Left upper leg, Right lower leg, Left lower leg, Front perineal area, Buttocks   Body parts bathed by helper: Front perineal area, Buttocks     Bathing assist Assist Level: Minimal Assistance - Patient > 75%     Upper Body Dressing/Undressing Upper body dressing   What is the patient wearing?: Pull over shirt    Upper body assist Assist Level: Supervision/Verbal cueing    Lower Body Dressing/Undressing Lower body dressing      What is the patient wearing?: Pants     Lower body assist Assist for lower body dressing: Minimal Assistance - Patient > 75%     Toileting Toileting    Toileting assist Assist for toileting: Maximal Assistance - Patient 25 - 49%     Transfers Chair/bed transfer  Transfers assist     Chair/bed transfer assist level: Minimal Assistance - Patient > 75%     Locomotion Ambulation   Ambulation assist      Assist level: Minimal Assistance - Patient > 75% Assistive device: Walker-rolling Max distance: 25   Walk 10 feet activity   Assist  Walk 10 feet activity did not occur: Safety/medical concerns  Assist level: Minimal Assistance - Patient > 75% Assistive device: Walker-rolling   Walk 50 feet activity   Assist Walk 50 feet with 2  turns activity did not occur: Safety/medical concerns         Walk 150 feet activity   Assist Walk 150 feet activity did not occur: Safety/medical concerns         Walk 10 feet on uneven surface  activity   Assist Walk 10 feet on uneven surfaces activity did not occur: Safety/medical concerns         Wheelchair     Assist Will patient use wheelchair at discharge?: Yes Type of Wheelchair: Manual    Wheelchair assist level: Supervision/Verbal  cueing Max wheelchair distance: 172ft     Wheelchair 50 feet with 2 turns activity    Assist    Wheelchair 50 feet with 2 turns activity did not occur: Safety/medical concerns   Assist Level: Supervision/Verbal cueing   Wheelchair 150 feet activity     Assist Wheelchair 150 feet activity did not occur: Safety/medical concerns   Assist Level: Supervision/Verbal cueing      Medical Problem List and Plan: 1.Functional deficits and truncal/limb ataxiasecondary to vestibular schwannoma s/p crani/resection  Continue CIR therapies 2. DVT Prophylaxis/Anticoagulation: Pharmaceutical:Lovenox 3.Chronic back pain/Pain Management:Tylenol prn 4. Mood:LCSW to follow for evaluation and support. 5. Neuropsych: This patientiscapable of making decisions on herown behalf. 6. Skin/Wound Care:Routine pressure relief measures. Monitor wound for healing. 7. Fluids/Electrolytes/Nutrition:Monitor I/O.   D1 thins, advance diet as tolerated 8. T2DM:Hgb A1c- 6.5.Monitor BS ac/hs. Use SSI for elevated BS--is on very limited diet.  Amaryl 1 mg started on 1/15  Sugars seem to be trending down, 1/18  Monitor with increased mobility 9. Acute on chronic renal disease: Baseline SCr- 2.4--->2.54.  Patient states she was told she would need dialysis at the age of 38, but has been managing with diet control.  Creatinine 2.53 on 1/18  Expect worsening of kidney function with changes to blood pressure medications.  Continue to monitor 10. Occult spina bifida with neuropathy: Managed with OTC supplements. 11. Leucocytosis: Monitor for signs of infection. 12. Acute blood loss anemia:  Hemoglobin 10.1 on 1/15  Continue to monitor 13. Vestibular dysfunction: N/V with dizzinessimproving. -treat supportively 14. HTN: BPstill labile---Monitor BP bid. Continue Avapro and Norvasc.  Hydralizine 25 TID started on 1/12, DC'd on 1/13  Renal ultrasound relative  unremarkable  Informed by nursing regarding patient taking "herbs" from home.  Patient not to take any alternative medication at present-discussed with patient.  Appreciate nephro recs-Lasix added, carvedilol added  Improved 1/18  Monitor with increased mobility 15. Hypoxia: Encourage IS.   Weaned oxygen to off as tolerated. 16. Right CN VII palsy: Improving -eye lubricants  -Needs to keep eye covered.  Eyes noticeably more irritated from admission.   -Can try patches or tape, discussed with patient and nursing 17.  Hypoalbuminemia  Supplement initiated on 1/11 18.  Cloudy and foul urine  UA equivocal  Cont to monitor 19.  Oral blister  Oral gel ordered 20.  Hypokalemia  Potassium 3.9 on 1/18  Supplement    LOS: 8 days A FACE TO FACE EVALUATION WAS PERFORMED  [name] 04/27/2018, 8:36 AM

## 2018-04-28 ENCOUNTER — Inpatient Hospital Stay (HOSPITAL_COMMUNITY): Payer: Medicare Other

## 2018-04-28 LAB — RENAL FUNCTION PANEL
Albumin: 2.9 g/dL — ABNORMAL LOW (ref 3.5–5.0)
Anion gap: 11 (ref 5–15)
BUN: 33 mg/dL — ABNORMAL HIGH (ref 8–23)
CHLORIDE: 94 mmol/L — AB (ref 98–111)
CO2: 33 mmol/L — ABNORMAL HIGH (ref 22–32)
Calcium: 9.6 mg/dL (ref 8.9–10.3)
Creatinine, Ser: 2.65 mg/dL — ABNORMAL HIGH (ref 0.44–1.00)
GFR calc Af Amer: 20 mL/min — ABNORMAL LOW (ref >60)
GFR, EST NON AFRICAN AMERICAN: 18 mL/min — AB (ref >60)
Glucose, Bld: 112 mg/dL — ABNORMAL HIGH (ref 70–99)
Phosphorus: 3.3 mg/dL (ref 2.5–4.6)
Potassium: 4.3 mmol/L (ref 3.5–5.1)
Sodium: 138 mmol/L (ref 135–145)

## 2018-04-28 LAB — GLUCOSE, CAPILLARY
Glucose-Capillary: 103 mg/dL — ABNORMAL HIGH (ref 70–99)
Glucose-Capillary: 181 mg/dL — ABNORMAL HIGH (ref 70–99)
Glucose-Capillary: 121 mg/dL — ABNORMAL HIGH (ref 70–99)
Glucose-Capillary: 125 mg/dL — ABNORMAL HIGH (ref 70–99)

## 2018-04-28 NOTE — Plan of Care (Signed)
  Problem: RH BOWEL ELIMINATION Goal: RH STG MANAGE BOWEL WITH ASSISTANCE Description STG Manage Bowel with Assistance. mod  Outcome: Progressing   Problem: RH BLADDER ELIMINATION Goal: RH STG MANAGE BLADDER WITH ASSISTANCE Description STG Manage Bladder With Assistance mod  Outcome: Progressing   Problem: RH SKIN INTEGRITY Goal: RH STG SKIN FREE OF INFECTION/BREAKDOWN Description Remain free of skin breakdown and infection while on rehab with mod assist  Outcome: Progressing   Problem: RH SAFETY Goal: RH STG ADHERE TO SAFETY PRECAUTIONS W/ASSISTANCE/DEVICE Description STG Adhere to Safety Precautions With Assistance/Device. Mod  Outcome: Progressing   Problem: Consults Goal: Skin Care Protocol Initiated - if Braden Score 18 or less Description If consults are not indicated, leave blank or document N/A Outcome: Progressing

## 2018-04-28 NOTE — Progress Notes (Signed)
Subjective: Interval History: has complaints wants up earlier and more therapy.  Objective: Vital signs in last 24 hours: Temp:  [98.2 F (36.8 C)-99 F (37.2 C)] 98.5 F (36.9 C) (01/19 0433) Pulse Rate:  [73-98] 84 (01/19 0433) Resp:  [16-20] 16 (01/19 0433) BP: (143-160)/(74-88) 151/77 (01/19 0433) SpO2:  [91 %-93 %] 92 % (01/19 0433) Weight change:   Intake/Output from previous day: 01/18 0701 - 01/19 0700 In: 960 [P.O.:960] Out: 2351 [Urine:2350; Emesis/NG output:1] Intake/Output this shift: No intake/output data recorded.  General appearance: alert, cooperative, no distress and moderately obese Resp: diminished breath sounds bilaterally Cardio: S1, S2 normal and systolic murmur: systolic ejection 2/6, crescendo and decrescendo at 2nd left intercostal space GI: obese, pos bs, soft Extremities: extremities normal, atraumatic, no cyanosis or edema  Lab Results: No results for input(s): WBC, HGB, HCT, PLT in the last 72 hours. BMET:  Recent Labs    04/27/18 0620 04/28/18 0706  NA 138 138  K 3.9 4.3  CL 95* 94*  CO2 34* 33*  GLUCOSE 96 112*  BUN 32* 33*  CREATININE 2.53* 2.65*  CALCIUM 9.3 9.6   No results for input(s): PTH in the last 72 hours. Iron Studies: No results for input(s): IRON, TIBC, TRANSFERRIN, FERRITIN in the last 72 hours.  Studies/Results: No results found.  I have reviewed the patient's current medications.  Assessment/Plan: 1 HTN better control , med adjusted yest 2 CKD4 stable  3 Obesity 4 Brain tumor 5 DM controlled P cont current meds, will s/o at this time, see as outpatient.   LOS: 9 days   Jeneen Rinks Lizabeth Fellner 04/28/2018,9:21 AM

## 2018-04-28 NOTE — Progress Notes (Signed)
Occupational Therapy Note  Patient Details  Name: Natalie Friedman MRN: [id number] Date of Birth: 12-27-1950  Today's Date: 04/28/2018 OT Missed Time: 21 Minutes Missed Time Reason: Patient ill (comment);Patient fatigue(reporting tired and having a bad day)  Pt seen seated in w/c, just receiving lunch with husband at side holding hand. Pt declines participating in skilled tx group d/t having a rough day and "not feeling well" RN aware.   Lowella Dell Braydn Carneiro 04/28/2018, 4:19 PM

## 2018-04-28 NOTE — Progress Notes (Signed)
[place] PHYSICAL MEDICINE & REHABILITATION PROGRESS NOTE  Subjective/Complaints: Nurse reports low-grade temperature of 99.  Patient denies any fever, chills, dysuria, cough.  Right eye was taped last night.  ROS: Patient denies fever, rash, sore throat,  nausea, vomiting, diarrhea, cough, shortness of breath or chest pain, joint or back pain, headache, or mood change.    Objective: Vital Signs: Blood pressure (!) 151/77, pulse 84, temperature 98.5 F (36.9 C), temperature source Oral, resp. rate 16, height 5\' 6"  (1.676 m), weight 100 kg, SpO2 92 %. No results found. No results for input(s): WBC, HGB, HCT, PLT in the last 72 hours. Recent Labs    04/27/18 0620 04/28/18 0706  NA 138 138  K 3.9 4.3  CL 95* 94*  CO2 34* 33*  GLUCOSE 96 112*  BUN 32* 33*  CREATININE 2.53* 2.65*  CALCIUM 9.3 9.6    Physical Exam: BP (!) 151/77 (BP Location: Right Arm)   Pulse 84   Temp 98.5 F (36.9 C) (Oral)   Resp 16   Ht 5\' 6"  (1.676 m)   Wt 100 kg   SpO2 92%   BMI 35.58 kg/m  Constitutional: No distress . Vital signs reviewed. HEENT: EOMI, oral membranes moist.  Right eye taped Neck: supple Cardiovascular: RRR without murmur. No JVD    Respiratory: CTA Bilaterally without wheezes or rales. Normal effort    GI: BS +, non-tender, non-distended  Musc: Lower extremity edema. Neurological: Alert Right facial weakness, unchanged Dysarthria but improving phonation Motor: 45 prox to distal throughout, left stronger than right, stable   Skin: Skin iswarm.Crani incision CDI Psychiatric: Pleasant and cooperative.  Assessment/Plan: 1. 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  Physiatrist is providing close team supervision and 24 hour management of active medical problems listed below.  Physiatrist and rehab team continue to assess barriers to discharge/monitor  patient progress toward functional and medical goals  Care Tool:  Bathing    Body parts bathed by patient: Right arm, Left arm, Chest, Abdomen, Face, Right upper leg, Left upper leg, Right lower leg, Left lower leg, Front perineal area, Buttocks   Body parts bathed by helper: Front perineal area, Buttocks     Bathing assist Assist Level: Minimal Assistance - Patient > 75%     Upper Body Dressing/Undressing Upper body dressing   What is the patient wearing?: Pull over shirt    Upper body assist Assist Level: Supervision/Verbal cueing    Lower Body Dressing/Undressing Lower body dressing      What is the patient wearing?: Pants     Lower body assist Assist for lower body dressing: Minimal Assistance - Patient > 75%     Toileting Toileting    Toileting assist Assist for toileting: Maximal Assistance - Patient 25 - 49%     Transfers Chair/bed transfer  Transfers assist     Chair/bed transfer assist level: Minimal Assistance - Patient > 75%     Locomotion Ambulation   Ambulation assist      Assist level: Minimal Assistance - Patient > 75% Assistive device: Walker-rolling Max distance: 25   Walk 10 feet activity   Assist  Walk 10 feet activity did not occur: Safety/medical concerns  Assist level: Minimal Assistance - Patient > 75% Assistive device: Walker-rolling   Walk 50 feet activity   Assist Walk 50 feet with 2 turns activity did not occur: Safety/medical concerns  Walk 150 feet activity   Assist Walk 150 feet activity did not occur: Safety/medical concerns         Walk 10 feet on uneven surface  activity   Assist Walk 10 feet on uneven surfaces activity did not occur: Safety/medical concerns         Wheelchair     Assist Will patient use wheelchair at discharge?: Yes Type of Wheelchair: Manual    Wheelchair assist level: Supervision/Verbal cueing Max wheelchair distance: 185ft     Wheelchair 50 feet with  2 turns activity    Assist    Wheelchair 50 feet with 2 turns activity did not occur: Safety/medical concerns   Assist Level: Supervision/Verbal cueing   Wheelchair 150 feet activity     Assist Wheelchair 150 feet activity did not occur: Safety/medical concerns   Assist Level: Supervision/Verbal cueing      Medical Problem List and Plan: 1.Functional deficits and truncal/limb ataxiasecondary to vestibular schwannoma s/p crani/resection  Continue CIR therapies 2. DVT Prophylaxis/Anticoagulation: Pharmaceutical:Lovenox 3.Chronic back pain/Pain Management:Tylenol prn 4. Mood:LCSW to follow for evaluation and support. 5. Neuropsych: This patientiscapable of making decisions on herown behalf. 6. Skin/Wound Care:Routine pressure relief measures. Monitor wound for healing. 7. Fluids/Electrolytes/Nutrition:Monitor I/O.   D1 thins, advance diet as tolerated 8. T2DM:Hgb A1c- 6.5.Monitor BS ac/hs. Use SSI for elevated BS--is on very limited diet.  Amaryl 1 mg started on 1/15  Sugars under reasonable control 1/19 9. Acute on chronic renal disease: Baseline SCr- 2.4--->2.54.  Patient states she was told she would need dialysis at the age of 74, but has been managing with diet control.  Creatinine 2.65 on 1/19   Continue to monitor 10. Occult spina bifida with neuropathy: Managed with OTC supplements. 11. Leucocytosis: Monitor for signs of infection. 12. Acute blood loss anemia:  Hemoglobin 10.1 on 1/15  Continue to monitor 13. Vestibular dysfunction: N/V with dizzinessimproving. -treat supportively 14. HTN: BPstill labile---Monitor BP bid. Continue Avapro and Norvasc.  Hydralizine 25 TID started on 1/12, DC'd on 1/13  Renal ultrasound relative unremarkable  Informed by nursing regarding patient taking "herbs" from home.  Patient not to take any alternative medication at present-discussed with patient.  Appreciate nephro recs-Lasix added,  carvedilol added  Improved 1/19  Monitor with increased mobility 15. Hypoxia: Encourage IS.   Weaned oxygen to off as tolerated. 16. Right CN VII palsy: Improving -Continue eye lubricants   -Needs to keep eye covered when possible.   -Continue patches or tape, discussed with patient and nursing 17.  Hypoalbuminemia  Supplement initiated on 1/11 18.  Cloudy and foul urine, patient denies but nursing reports  UA equivocal  With low-grade temp, go ahead and check urine culture today 19.  Oral blister  Oral gel ordered 20.  Hypokalemia  4.3 on 1/19  Supplement    LOS: 9 days A FACE TO FACE EVALUATION WAS PERFORMED  [name] 04/28/2018, 8:44 AM

## 2018-04-29 ENCOUNTER — Inpatient Hospital Stay (HOSPITAL_COMMUNITY): Payer: Medicare Other

## 2018-04-29 ENCOUNTER — Inpatient Hospital Stay (HOSPITAL_COMMUNITY): Payer: Medicare Other | Admitting: Speech Pathology

## 2018-04-29 ENCOUNTER — Inpatient Hospital Stay (HOSPITAL_COMMUNITY): Payer: Medicare Other | Admitting: Occupational Therapy

## 2018-04-29 ENCOUNTER — Inpatient Hospital Stay (HOSPITAL_COMMUNITY): Payer: Medicare Other | Admitting: Physical Therapy

## 2018-04-29 DIAGNOSIS — R7309 Other abnormal glucose: Secondary | ICD-10-CM

## 2018-04-29 DIAGNOSIS — I10 Essential (primary) hypertension: Secondary | ICD-10-CM

## 2018-04-29 DIAGNOSIS — N390 Urinary tract infection, site not specified: Secondary | ICD-10-CM

## 2018-04-29 LAB — RENAL FUNCTION PANEL
Albumin: 3 g/dL — ABNORMAL LOW (ref 3.5–5.0)
Anion gap: 11 (ref 5–15)
BUN: 34 mg/dL — ABNORMAL HIGH (ref 8–23)
CHLORIDE: 94 mmol/L — AB (ref 98–111)
CO2: 33 mmol/L — AB (ref 22–32)
Calcium: 9.8 mg/dL (ref 8.9–10.3)
Creatinine, Ser: 2.82 mg/dL — ABNORMAL HIGH (ref 0.44–1.00)
GFR calc Af Amer: 19 mL/min — ABNORMAL LOW (ref >60)
GFR calc non Af Amer: 16 mL/min — ABNORMAL LOW (ref >60)
GLUCOSE: 107 mg/dL — AB (ref 70–99)
Phosphorus: 3.6 mg/dL (ref 2.5–4.6)
Potassium: 4.6 mmol/L (ref 3.5–5.1)
Sodium: 138 mmol/L (ref 135–145)

## 2018-04-29 LAB — GLUCOSE, CAPILLARY
GLUCOSE-CAPILLARY: 166 mg/dL — AB (ref 70–99)
GLUCOSE-CAPILLARY: 200 mg/dL — AB (ref 70–99)
Glucose-Capillary: 102 mg/dL — ABNORMAL HIGH (ref 70–99)
Glucose-Capillary: 161 mg/dL — ABNORMAL HIGH (ref 70–99)

## 2018-04-29 MED ORDER — CEPHALEXIN 250 MG PO CAPS
250.0000 mg | ORAL_CAPSULE | Freq: Every day | ORAL | Status: AC
  Administered 2018-04-29 – 2018-05-05 (×7): 250 mg via ORAL
  Filled 2018-04-29 (×7): qty 1

## 2018-04-29 NOTE — Progress Notes (Signed)
Speech Language Pathology Daily Session Note  Patient Details  Name: Natalie Friedman MRN: [id number] Date of Birth: 1951/02/06  Today's Date: 04/29/2018 SLP Individual Time: 1430-1515 SLP Individual Time Calculation (min): 45 min  Short Term Goals: Week 2: SLP Short Term Goal 1 (Week 2): Pt will demonstrate speech intelligibility strategies at sentence level with 80% intelligibility given supervision verbal cues. SLP Short Term Goal 2 (Week 2): Pt will consume dys 1 and thin liquid via straw with min overt s/s aspiration and supervision verbal cues for swallow strategies.  SLP Short Term Goal 3 (Week 2): Pt will consume trials of dys 2 textured foods with min overt s/s aspiration and min A verbal cues for swallow strategies.  SLP Short Term Goal 4 (Week 2): Pt will complete semi-complex functional tasks with supervision for functional problem solving.   SLP Short Term Goal 5 (Week 2): Pt will use external aids to recall semi-complex daily information with supervision.    Skilled Therapeutic Interventions:  Skilled treatment session focused on dysphagia and cognition goals. SLP facilitated session by providing skilled observation of pt consuming trials of dysphagia 2 (graham crackers and pudding as well as peaches). Pt benefited from mirror to contain all boluses and no anterior spillage was observed. Pt required Mod A faded to min A cues to clear right buccal area (she used her tongue effectively and also used her finger). Of note, pt's dentures were not in here room and this might have contributed to added residue on that side. Pt frequently had difficulty understanding rationale for pureed foods and she is vegan as well so some soft solids she declines. Pt would benefit from continued trials of soft solids. Pt left upright in bed, all needs within reach. Continue per current plan of care.      Pain Pain Assessment Pain Scale: 0-10 Pain Score: 0-No pain  Therapy/Group: Individual  Therapy  Tangia Pinard 04/29/2018, 3:15 PM

## 2018-04-29 NOTE — Progress Notes (Signed)
Occupational Therapy Session Note  Patient Details  Name: Natalie Friedman MRN: [id number] Date of Birth: [birth year]  Today's Date: 04/29/2018 OT Individual Time: 0920-1029 OT Individual Time Calculation (min): 69 min    Short Term Goals: Week 2:  OT Short Term Goal 1 (Week 2): Pt will transfer to toilet wiht MIN A and LRAD OT Short Term Goal 2 (Week 2): Pt will advance pant past hips wiht S  OT Short Term Goal 3 (Week 2): Pt will complete 3/3 components of toileting wiht CGA OT Short Term Goal 4 (Week 2): Pt will wash 50% of body with RUE to demo improve control and strength in RUE  Skilled Therapeutic Interventions/Progress Updates:    Pt worked on bathing and dressing during session.  Min assist for stand pivot transfer into the shower with use of the RW for support.  Mod instructional cueing to push up from the arm rests of the wheelchair and to reach back to the seat before sitting.  Pt instead keeps hands on the walker and sits with decreased control.  She was able to complete all UB bathing with supervision and needed min assist for LB bathing sit to stand.  Min instructional cueing to apply more soap to the washcloth.  She completed dressing sit to stand from the shower bench with min assist and therapist placing towel down on the seat. Min assist with RW to stand pivot transfer back out to the wheelchair in the doorway.  Finished session with pt completing oral hygiene and combing her hair from the wheelchair at the sink.  Pt left at bedside in wheelchair per her choice with call button and phone in reach and safety alarm belt in place.     Therapy Documentation Precautions:  Precautions Precautions: Fall Precaution Comments: dizziness and nausea, h/o falls, double vision Other Brace: glasses with opaque occlusion on right lens of glasses Restrictions Weight Bearing Restrictions: No General: General PT Missed Treatment Reason: Patient ill (Comment)(nausea/fatigue ) Vital  Signs: Therapy Vitals Pulse Rate: 71 Resp: 20 BP: (!) 144/78 Patient Position (if appropriate): Sitting Oxygen Therapy SpO2: 97 % O2 Device: Room Air Pain: Pain Assessment Pain Scale: 0-10 Pain Score: 0-No pain ADL: See Care Tool for some details of ADL  Therapy/Group: Individual Therapy  Jamyla Ard OTR/L 04/29/2018, 10:29 AM

## 2018-04-29 NOTE — Progress Notes (Signed)
Physical Therapy Session Note  Patient Details  Name: Natalie Friedman MRN: [id number] Date of Birth: June 19, 1950  Today's Date: 04/29/2018 PT Individual Time: 0820-0910 PT Individual Time Calculation (min): 50 min   Short Term Goals: Week 2:  PT Short Term Goal 1 (Week 2): Pt will perform bed mobility with supervision assist consistently  PT Short Term Goal 2 (Week 2): Pt will perform bed<>WC transfers with Supervision assist  PT Short Term Goal 3 (Week 2): Pt will ambulate 764f with min assist from PT and LRAD  PT Short Term Goal 4 (Week 2): Pt will ascend 4 steps with BUE support and min assist from PT   Skilled Therapeutic Interventions/Progress Updates:     [facility] and willing to participate in PT this morning. Able to self-propel WC approximately 821fwith B UEs and multiple seated rest breaks due to UE fatigue, [facility]. Performed sit to stand with MinA and RW, but required ModA to complete pivot to chair and for controlled descent down to chair. Able to tolerate riding Nustep 5 minutesx2 with one rest break in between, B UEs/LEs on resistance 3 today, then required ModA for sit to stand and [facility]. Multiple extended rest breaks taken today due to nausea and fatigue. Attempted gait however patient only able to gait train approximately 64f75fith RW and minA, ongoing anterior lean/unsteadiness, had to stop and return to sitting quickly due to severe nausea. She was returned to her room totalA [facility] participated in seated LE exercises including LAQs, seated marches, and ankle pumps, then declined further therapy due to nausea/fatigue. She was left up in her chair with all needs met, seat belt alarm active this morning.   Therapy Documentation Precautions:  Precautions Precautions: Fall Precaution Comments: dizziness and nausea, h/o falls, double vision Other Brace: glasses with opaque occlusion on right lens of  glasses Restrictions Weight Bearing Restrictions: No General: PT Amount of Missed Time (min): 10 Minutes PT Missed Treatment Reason: Patient ill (Comment)(nausea/fatigue ) Pain: Pain Assessment Pain Scale: 0-10 Pain Score: 0-No pain    Therapy/Group: Individual Therapy  KriDeniece Ree, DPT, CBIS  Supplemental Physical Therapist [facility] Pager [phone] Rehab Office [phone]/20/2020, 9:17 AM

## 2018-04-29 NOTE — Progress Notes (Signed)
Patient currently eating breakfast, supervising CNA reports she may need about 15 minutes. Will return for therapy when patient is done eating.   Deniece Ree PT, DPT, CBIS  Supplemental Physical Therapist [facility]    Pager [phone] Acute Rehab Office [phone]

## 2018-04-29 NOTE — Progress Notes (Signed)
Occupational Therapy Session Note  Patient Details  Name: Natalie Friedman MRN: [id number] Date of Birth: May 26, 1950  Today's Date: 04/29/2018 OT Individual Time: 1330-1413 OT Individual Time Calculation (min): 43 min    Short Term Goals: Week 2:  OT Short Term Goal 1 (Week 2): Pt will transfer to toilet wiht MIN A and LRAD OT Short Term Goal 2 (Week 2): Pt will advance pant past hips wiht S  OT Short Term Goal 3 (Week 2): Pt will complete 3/3 components of toileting wiht CGA OT Short Term Goal 4 (Week 2): Pt will wash 50% of body with RUE to demo improve control and strength in RUE  Skilled Therapeutic Interventions/Progress Updates:    Pt received supine in bed with c/o fatigue and nausea. BP obtained and all VSS. Pt agreeable to come EOB but not leave room. Pt c/o diplopia and occluded R eye with min A. Extensive discussion with pt re return to physical activity and general wellness upon d/c. Pt held a 1 lb medicine ball and complete B UE strengthening circuit, with a focus on hitting targets to improve B UE coordination and visual tracking. Pt returned to supine in bed and was left with all needs met, bed alarm set.   Therapy Documentation Precautions:  Precautions Precautions: Fall Precaution Comments: dizziness and nausea, h/o falls, double vision Other Brace: glasses with opaque occlusion on right lens of glasses Restrictions Weight Bearing Restrictions: No Pain: Pain Assessment Pain Score: 0-No pain   Therapy/Group: Individual Therapy  Curtis Sites 04/29/2018, 1:35 PM

## 2018-04-29 NOTE — Plan of Care (Signed)
  Problem: RH SAFETY Goal: RH STG ADHERE TO SAFETY PRECAUTIONS W/ASSISTANCE/DEVICE Description STG Adhere to Safety Precautions With Assistance/Device. Mod  Outcome: Progressing  Call light within reach, bed/chair alarm, proper footwear, proper transferr

## 2018-04-29 NOTE — Progress Notes (Signed)
[place] PHYSICAL MEDICINE & REHABILITATION PROGRESS NOTE  Subjective/Complaints: Patient seen sitting up in her chair this morning.  She states she slept well overnight.  She states she feels better this morning.  ROS: Denies CP, SOB, N/V/D  Objective: Vital Signs: Blood pressure 139/69, pulse 64, temperature 98.4 F (36.9 C), temperature source Oral, resp. rate 17, height 5\' 6"  (1.676 m), weight 100 kg, SpO2 93 %. No results found. No results for input(s): WBC, HGB, HCT, PLT in the last 72 hours. Recent Labs    04/28/18 0706 04/29/18 0614  NA 138 138  K 4.3 4.6  CL 94* 94*  CO2 33* 33*  GLUCOSE 112* 107*  BUN 33* 34*  CREATININE 2.65* 2.82*  CALCIUM 9.6 9.8    Physical Exam: BP 139/69 (BP Location: Left Arm)   Pulse 64   Temp 98.4 F (36.9 C) (Oral)   Resp 17   Ht 5\' 6"  (1.676 m)   Wt 100 kg   SpO2 93%   BMI 35.58 kg/m  Constitutional: No distress . Vital signs reviewed. HENT: Normocephalic.  Atraumatic. Eyes: EOMI. No discharge. Cardiovascular: RRR. No JVD. Respiratory: CTA Bilaterally. Normal effort. GI: BS +. Non-distended. Musc: No edema or tenderness in extremities. Musc: Lower extremity edema. Neurological: Alert Right facial weakness, stable Dysarthria, stable Motor: 4/5 prox to distal throughout, left stronger than right, improving  Skin: Skin iswarm. Psychiatric: Pleasant and cooperative.  Assessment/Plan: 1. 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  Physiatrist is providing close team supervision and 24 hour management of active medical problems listed below.  Physiatrist and rehab team continue to assess barriers to discharge/monitor patient progress toward functional and medical goals  Care Tool:  Bathing    Body parts bathed by patient: Right arm, Left arm, Chest, Abdomen, Face, Right upper leg, Left upper leg,  Right lower leg, Left lower leg, Front perineal area, Buttocks   Body parts bathed by helper: Front perineal area, Buttocks     Bathing assist Assist Level: Minimal Assistance - Patient > 75%     Upper Body Dressing/Undressing Upper body dressing   What is the patient wearing?: Pull over shirt    Upper body assist Assist Level: Supervision/Verbal cueing    Lower Body Dressing/Undressing Lower body dressing      What is the patient wearing?: Pants     Lower body assist Assist for lower body dressing: Minimal Assistance - Patient > 75%     Toileting Toileting    Toileting assist Assist for toileting: Maximal Assistance - Patient 25 - 49%     Transfers Chair/bed transfer  Transfers assist     Chair/bed transfer assist level: Minimal Assistance - Patient > 75%     Locomotion Ambulation   Ambulation assist      Assist level: Minimal Assistance - Patient > 75% Assistive device: Walker-rolling Max distance: 25   Walk 10 feet activity   Assist  Walk 10 feet activity did not occur: Safety/medical concerns  Assist level: Minimal Assistance - Patient > 75% Assistive device: Walker-rolling   Walk 50 feet activity   Assist Walk 50 feet with 2 turns activity did not occur: Safety/medical concerns         Walk 150 feet activity   Assist Walk 150 feet activity did not occur: Safety/medical concerns         Walk 10 feet on uneven surface  activity  Assist Walk 10 feet on uneven surfaces activity did not occur: Safety/medical concerns         Wheelchair     Assist Will patient use wheelchair at discharge?: Yes Type of Wheelchair: Manual    Wheelchair assist level: Supervision/Verbal cueing Max wheelchair distance: 153ft     Wheelchair 50 feet with 2 turns activity    Assist    Wheelchair 50 feet with 2 turns activity did not occur: Safety/medical concerns   Assist Level: Supervision/Verbal cueing   Wheelchair 150 feet  activity     Assist Wheelchair 150 feet activity did not occur: Safety/medical concerns   Assist Level: Supervision/Verbal cueing      Medical Problem List and Plan: 1.Functional deficits and truncal/limb ataxiasecondary to vestibular schwannoma s/p crani/resection  Continue CIR   Weekend notes reviewed 2. DVT Prophylaxis/Anticoagulation: Pharmaceutical:Lovenox 3.Chronic back pain/Pain Management:Tylenol prn 4. Mood:LCSW to follow for evaluation and support. 5. Neuropsych: This patientiscapable of making decisions on herown behalf. 6. Skin/Wound Care:Routine pressure relief measures. Monitor wound for healing. 7. Fluids/Electrolytes/Nutrition:Monitor I/O.   D1 thins, advance diet as tolerated 8. T2DM:Hgb A1c- 6.5.Monitor BS ac/hs. Use SSI for elevated BS--is on very limited diet.  Amaryl 1 mg started on 1/15  Labile on 1/20 9. Acute on chronic renal disease: Baseline SCr- 2.4--->2.54.  Patient states she was told she would need dialysis at the age of 86, but has been managing with diet control.  Creatinine 2.82 on 1/20   Continue to monitor 10. Occult spina bifida with neuropathy: Managed with OTC supplements. 11. Leucocytosis: Monitor for signs of infection. 12. Acute blood loss anemia:  Hemoglobin 10.1 on 1/15  Labs ordered for tomorrow  Continue to monitor 13. Vestibular dysfunction: N/V with dizzinessimproving. 14. HTN: BPstill labile---Monitor BP bid. Continue Avapro and Norvasc.  Hydralizine 25 TID started on 1/12, DC'd on 1/13  Renal ultrasound relative unremarkable  Informed by nursing regarding patient taking "herbs" from home.  Patient not to take any alternative medication at present-discussed with patient.  Appreciate nephro recs-Lasix added, carvedilol added  Labile on 1/20  Monitor with increased mobility 15. Hypoxia: Encourage IS.   Weaned oxygen to off as tolerated. 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 17.  Hypoalbuminemia  Supplement initiated on 1/11 18.  UTI  Urine culture with greater than 100,000 gram-negative rods, await sensitivities  Empiric Keflex started on 1/20 to 50 mg daily, based on calculated creatinine clearance 19.  Oral blister  Oral gel ordered 20.  Hypokalemia  4.6 on 1/20  Supplement    LOS: 10 days A FACE TO FACE EVALUATION WAS PERFORMED  Alaysiah Browder Lorie Phenix 04/29/2018, 8:57 AM

## 2018-04-30 ENCOUNTER — Inpatient Hospital Stay (HOSPITAL_COMMUNITY): Payer: Medicare Other | Admitting: Physical Therapy

## 2018-04-30 ENCOUNTER — Inpatient Hospital Stay (HOSPITAL_COMMUNITY): Payer: Medicare Other | Admitting: Occupational Therapy

## 2018-04-30 ENCOUNTER — Inpatient Hospital Stay (HOSPITAL_COMMUNITY): Payer: Medicare Other | Admitting: Speech Pathology

## 2018-04-30 DIAGNOSIS — N179 Acute kidney failure, unspecified: Secondary | ICD-10-CM | POA: Insufficient documentation

## 2018-04-30 DIAGNOSIS — N189 Chronic kidney disease, unspecified: Secondary | ICD-10-CM

## 2018-04-30 LAB — GLUCOSE, CAPILLARY
Glucose-Capillary: 105 mg/dL — ABNORMAL HIGH (ref 70–99)
Glucose-Capillary: 271 mg/dL — ABNORMAL HIGH (ref 70–99)
Glucose-Capillary: 163 mg/dL — ABNORMAL HIGH (ref 70–99)
Glucose-Capillary: 197 mg/dL — ABNORMAL HIGH (ref 70–99)

## 2018-04-30 LAB — URINE CULTURE: Culture: >=100000 — AB

## 2018-04-30 LAB — RENAL FUNCTION PANEL
Albumin: 3 g/dL — ABNORMAL LOW (ref 3.5–5.0)
Anion gap: 9 (ref 5–15)
BUN: 36 mg/dL — ABNORMAL HIGH (ref 8–23)
CO2: 34 mmol/L — ABNORMAL HIGH (ref 22–32)
Calcium: 10.1 mg/dL (ref 8.9–10.3)
Chloride: 93 mmol/L — ABNORMAL LOW (ref 98–111)
Creatinine, Ser: 3.02 mg/dL — ABNORMAL HIGH (ref 0.44–1.00)
GFR calc Af Amer: 17 mL/min — ABNORMAL LOW (ref >60)
GFR calc non Af Amer: 15 mL/min — ABNORMAL LOW (ref >60)
Glucose, Bld: 127 mg/dL — ABNORMAL HIGH (ref 70–99)
POTASSIUM: 4.6 mmol/L (ref 3.5–5.1)
Phosphorus: 3.8 mg/dL (ref 2.5–4.6)
Sodium: 136 mmol/L (ref 135–145)

## 2018-04-30 LAB — CBC WITH DIFFERENTIAL/PLATELET
Abs Immature Granulocytes: 0.04 10*3/uL (ref 0.00–0.07)
Basophils Absolute: 0 10*3/uL (ref 0.0–0.1)
Basophils Relative: 0 %
Eosinophils Absolute: 0.1 10*3/uL (ref 0.0–0.5)
Eosinophils Relative: 1 %
HCT: 37.5 % (ref 36.0–46.0)
HEMOGLOBIN: 11.8 g/dL — AB (ref 12.0–15.0)
Immature Granulocytes: 1 %
LYMPHS PCT: 17 %
Lymphs Abs: 1.4 10*3/uL (ref 0.7–4.0)
MCH: 29.4 pg (ref 26.0–34.0)
MCHC: 31.5 g/dL (ref 30.0–36.0)
MCV: 93.5 fL (ref 80.0–100.0)
MONO ABS: 0.7 10*3/uL (ref 0.1–1.0)
Monocytes Relative: 9 %
Neutro Abs: 6 10*3/uL (ref 1.7–7.7)
Neutrophils Relative %: 72 %
Platelets: 289 10*3/uL (ref 150–400)
RBC: 4.01 MIL/uL (ref 3.87–5.11)
RDW: 11.8 % (ref 11.5–15.5)
WBC: 8.2 10*3/uL (ref 4.0–10.5)
nRBC: 0 % (ref 0.0–0.2)

## 2018-04-30 NOTE — Progress Notes (Signed)
Speech Language Pathology Daily Session Note  Patient Details  Name: Natalie Friedman MRN: [id number] Date of Birth: May 16, 1950  Today's Date: 04/30/2018 SLP Individual Time: 1005-1100 SLP Individual Time Calculation (min): 55 min  Short Term Goals: Week 2: SLP Short Term Goal 1 (Week 2): Pt will demonstrate speech intelligibility strategies at sentence level with 80% intelligibility given supervision verbal cues. SLP Short Term Goal 2 (Week 2): Pt will consume dys 1 and thin liquid via straw with min overt s/s aspiration and supervision verbal cues for swallow strategies.  SLP Short Term Goal 3 (Week 2): Pt will consume trials of dys 2 textured foods with min overt s/s aspiration and min A verbal cues for swallow strategies.  SLP Short Term Goal 4 (Week 2): Pt will complete semi-complex functional tasks with supervision for functional problem solving.   SLP Short Term Goal 5 (Week 2): Pt will use external aids to recall semi-complex daily information with supervision.    Skilled Therapeutic Interventions: Pt was seen for skilled ST targeting dysphagia and cognition. Upon pt's request, SLP transferred pt to restroom where she attempted to urinate and make a bowel movement, however she did not produce either. Once pt was transferred back to her wheelchair, SLP facilitated upgraded dysphagia 2 (soft/fine chop) texture trials consisting of peaches and softened graham cracker pieces. Pt efficiently masticated POs with min assist to recall and utilize compensatory strategies. No overt s/s aspiration and very minimal anterior loss of liquids was observed. SLP anticipates trial of full dysphagia 2 meal in the near future to fully assess readiness for diet upgrade. Pt required mod-max verbal and visual cues as well as encouragement during a functional money counting task to address complex problem solving goals. Pt was left sitting in wheelchair with brakes and alarm set and all needs met. She continues to  benefit from skilled ST, thus recommend continue per current plan of care.      Pain Pain Assessment Pain Scale: 0-10 Pain Score: 0-No pain  Therapy/Group: Individual Therapy   Jettie Booze, Student SLP   Jettie Booze 04/30/2018, 12:12 PM

## 2018-04-30 NOTE — Progress Notes (Addendum)
Physical Therapy Session Note  Patient Details  Name: Natalie Friedman MRN: [id number] Date of Birth: 1950/08/23  Today's Date: 04/30/2018 PT Individual Time: 1353-1505 PT Individual Time Calculation (min): 72 min   Skilled Therapeutic Interventions/Progress Updates:  Pt received in w/c & agreeable to tx. Assisted pt with donning jacket and pt propels w/c room>majority of way to gym with BUE and min assist with cuing for attention to R side of environment. Pt transfers sit<>stand with min assist with cuing for hand placement for increased safety. Pt ambulates 5 ft + 5 ft with RW & mod assist with R lateral lean, decreased weight shifting L, decreased heel strike RLE, maintaining RLE hip/knee flexion throughout. Pt stood with UE support from RW & min assist for balance while reaching overhead/L outside of BOS to obtain & toss objects with task focusing on standing balance and weight shifting L. Pt transfers w/c>mat table via stand pivot with mod assist with decreased weight shifting to LLE and impaired RLE foot advancement. From EOM pt performed sit<>stand transfers with mod assist without BUE support and cuing for upright posture and full hip extension and anterior pelvis in standing; pt with posterior lean and LOB and requires mod<>max assist for increased eccentric control with stand>sit. Pt utilized cybex kinetron in sitting with task focusing on BLE Strengthening & NMR; unable to progress to standing 2/2 nausea. Pt propels w/c dayroom>west nurses station with min assist. Pt reports need to use restroom and transfers w/c<>toilet with min assist, cuing for technique, and use of grab bar. Pt unable to void or have BM. Pt transfers w/c>bed with max cuing for stand pivot and sit>supine with supervision. Pt left in bed in care of RN, call bell in reach & husband present in room.   Pt requires frequent seated rest breaks 2/2 nausea (RN made aware of nausea) & fatigue.  No c/o pain during session.   Therapy  Documentation Precautions:  Precautions Precautions: Fall Precaution Comments: dizziness and nausea, h/o falls, double vision Other Brace: glasses with opaque occlusion on right lens of glasses Restrictions Weight Bearing Restrictions: No    Therapy/Group: Individual Therapy  Waunita Schooner 04/30/2018, 3:25 PM

## 2018-04-30 NOTE — Progress Notes (Signed)
Occupational Therapy Session Note  Patient Details  Name: Natalie Friedman MRN: [id number] Date of Birth: 01-16-1951  Today's Date: 04/30/2018 OT Individual Time: 8127-5170 OT Individual Time Calculation (min): 90 min    Short Term Goals: Week 2:  OT Short Term Goal 1 (Week 2): Pt will transfer to toilet wiht MIN A and LRAD OT Short Term Goal 2 (Week 2): Pt will advance pant past hips wiht S  OT Short Term Goal 3 (Week 2): Pt will complete 3/3 components of toileting wiht CGA OT Short Term Goal 4 (Week 2): Pt will wash 50% of body with RUE to demo improve control and strength in RUE  Skilled Therapeutic Interventions/Progress Updates:    Pt completed bathing and dressing during session this am.  No report of nausea to start session with functional stand pivot transfer to the 3:1 with mod assist using the RW for support.  She needed min assist for managing clothing as well.  She then transferred over to the shower bench with min assist and mod instructional cueing for turning all the way around to the seat and aligning herself and reach back to the surface.  She was able to complete bathing sit to stand with min assist and min instructional cueing for thoroughness.  She transferred back to the wheelchair with min assist and out to the sink for dressing and grooming tasks.  Min assist overall for LB dressing sit to stand with supervision for UB dressing.  She completed grooming tasks of brushing her teeth and her hair with increased time and supervision from the wheelchair as well.  Once this was completed, therapist had pt propel her wheelchair down to the nurses station with close supervision.  Therapist then finished with transition to the ADL apartment where she practiced walk-in shower transfers with use of the RW and shower seat.  Had pt stepping backwards over the simulated shower edge with use of the RW and then sitting down with the shower seat facing out the door.  She needed mod assist to  complete.  She transferred out by stepping forward over the edge with mod assist and then back to the wheelchair.  Finished session with transfer back to the room and pt staying up in the wheelchair with call button and phone in reach and safety alarm belt in place.  Pt with some report of nausea at conclusion of shower transfer and at end of session, but improved with resting.    Therapy Documentation Precautions:  Precautions Precautions: Fall Precaution Comments: dizziness and nausea, h/o falls, double vision Other Brace: glasses with opaque occlusion on right lens of glasses Restrictions Weight Bearing Restrictions: No  Pain: Pain Assessment Pain Scale: 0-10 Pain Score: 0-No pain ADL: See Care Tool for some details of ADL  Therapy/Group: Individual Therapy  Yasuko Lapage OTR/L  04/30/2018, 12:16 PM

## 2018-04-30 NOTE — Plan of Care (Signed)
  Problem: RH BOWEL ELIMINATION Goal: RH STG MANAGE BOWEL WITH ASSISTANCE Description STG Manage Bowel with Assistance. mod  Outcome: Progressing   Problem: RH BLADDER ELIMINATION Goal: RH STG MANAGE BLADDER WITH ASSISTANCE Description STG Manage Bladder With Assistance mod  Outcome: Progressing   Problem: RH SKIN INTEGRITY Goal: RH STG SKIN FREE OF INFECTION/BREAKDOWN Description Remain free of skin breakdown and infection while on rehab with mod assist  Outcome: Progressing   Problem: RH SAFETY Goal: RH STG ADHERE TO SAFETY PRECAUTIONS W/ASSISTANCE/DEVICE Description STG Adhere to Safety Precautions With Assistance/Device. Mod  Outcome: Progressing   Problem: Consults Goal: Skin Care Protocol Initiated - if Braden Score 18 or less Description If consults are not indicated, leave blank or document N/A Outcome: Progressing

## 2018-04-30 NOTE — Progress Notes (Signed)
[place] PHYSICAL MEDICINE & REHABILITATION PROGRESS NOTE  Subjective/Complaints: Patient seen laying in bed this morning.  She states she slept well overnight.  She is upset and does not feel like she is making any progress.  She is also upset that she is not eating the foods that she was eating at home.  Discussed dysphagia with patient, however patient believes that she will be fine eating the same diet she was eating at home.  She also states that she does not feel that her husband can take care of her.  ROS: Denies CP, SOB, N/V/D  Objective: Vital Signs: Blood pressure (!) 152/78, pulse 64, temperature 98.5 F (36.9 C), resp. rate 18, height 5\' 6"  (1.676 m), weight 100 kg, SpO2 93 %. No results found. Recent Labs    04/30/18 0726  WBC 8.2  HGB 11.8*  HCT 37.5  PLT 289   Recent Labs    04/29/18 0614 04/30/18 0726  NA 138 136  K 4.6 4.6  CL 94* 93*  CO2 33* 34*  GLUCOSE 107* 127*  BUN 34* 36*  CREATININE 2.82* 3.02*  CALCIUM 9.8 10.1    Physical Exam: BP (!) 152/78 (BP Location: Left Arm)   Pulse 64   Temp 98.5 F (36.9 C)   Resp 18   Ht 5\' 6"  (1.676 m)   Wt 100 kg   SpO2 93%   BMI 35.58 kg/m  Constitutional: No distress . Vital signs reviewed. HENT: Normocephalic.  Atraumatic. Eyes: EOMI. No discharge. Cardiovascular: RRR. No JVD. Respiratory: CTA bilaterally. Normal effort. GI: BS +. Non-distended. Musc: No edema or tenderness in extremities. Musc: No edema or tenderness in extremities. Neurological: Alert Right facial weakness, unchanged Dysarthria, unchanged Motor: 4/5 prox to distal throughout, left stronger than right, improving  Skin: Skin iswarm. Psychiatric: Pleasant and cooperative.  Assessment/Plan: 1. 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  Physiatrist is providing close team supervision and 24 hour management of  active medical problems listed below.  Physiatrist and rehab team continue to assess barriers to discharge/monitor patient progress toward functional and medical goals  Care Tool:  Bathing    Body parts bathed by patient: Left arm, Right arm, Chest, Abdomen, Front perineal area, Buttocks, Right upper leg, Left upper leg, Right lower leg, Face, Left lower leg   Body parts bathed by helper: Front perineal area, Buttocks     Bathing assist Assist Level: Minimal Assistance - Patient > 75%     Upper Body Dressing/Undressing Upper body dressing   What is the patient wearing?: Pull over shirt    Upper body assist Assist Level: Supervision/Verbal cueing    Lower Body Dressing/Undressing Lower body dressing      What is the patient wearing?: Pants     Lower body assist Assist for lower body dressing: Minimal Assistance - Patient > 75%     Toileting Toileting    Toileting assist Assist for toileting: Maximal Assistance - Patient 25 - 49%     Transfers Chair/bed transfer  Transfers assist     Chair/bed transfer assist level: Minimal Assistance - Patient > 75%     Locomotion Ambulation   Ambulation assist      Assist level: Minimal Assistance - Patient > 75% Assistive device: Walker-rolling Max distance: 59ft   Walk 10 feet activity   Assist  Walk 10 feet activity did not occur: Safety/medical concerns  Assist level: Minimal Assistance -  Patient > 75% Assistive device: Walker-rolling   Walk 50 feet activity   Assist Walk 50 feet with 2 turns activity did not occur: Safety/medical concerns         Walk 150 feet activity   Assist Walk 150 feet activity did not occur: Safety/medical concerns         Walk 10 feet on uneven surface  activity   Assist Walk 10 feet on uneven surfaces activity did not occur: Safety/medical concerns         Wheelchair     Assist Will patient use wheelchair at discharge?: Yes Type of Wheelchair: Manual     Wheelchair assist level: Supervision/Verbal cueing Max wheelchair distance: 30ft     Wheelchair 50 feet with 2 turns activity    Assist    Wheelchair 50 feet with 2 turns activity did not occur: Safety/medical concerns   Assist Level: Supervision/Verbal cueing   Wheelchair 150 feet activity     Assist Wheelchair 150 feet activity did not occur: Safety/medical concerns   Assist Level: Supervision/Verbal cueing      Medical Problem List and Plan: 1.Functional deficits and truncal/limb ataxiasecondary to vestibular schwannoma s/p crani/resection  Continue CIR  2. DVT Prophylaxis/Anticoagulation: Pharmaceutical:Lovenox 3.Chronic back pain/Pain Management:Tylenol prn 4. Mood:LCSW to follow for evaluation and support. 5. Neuropsych: This patientiscapable of making decisions on herown behalf. 6. Skin/Wound Care:Routine pressure relief measures. Monitor wound for healing. 7. Fluids/Electrolytes/Nutrition:Monitor I/O.   D1 thins, advance diet as tolerated 8. T2DM:Hgb A1c- 6.5.Monitor BS ac/hs. Use SSI for elevated BS--is on very limited diet.  Amaryl 1 mg started on 1/15  Labile on 1/21 9. Acute on chronic renal disease: Baseline SCr- 2.4--->2.54.  Patient states she was told she would need dialysis at the age of 46, but has been managing with diet control.  Creatinine 3.02 on 1/21   Continue to monitor 10. Occult spina bifida with neuropathy: Managed with OTC supplements. 11. Leucocytosis: Monitor for signs of infection. 12. Acute blood loss anemia:  Hemoglobin 11.8 on 1/21  Continue to monitor 13. Vestibular dysfunction: N/V with dizzinessimproving. 14. HTN: BPstill labile---Monitor BP bid. Continue Avapro and Norvasc.  Hydralizine 25 TID started on 1/12, DC'd on 1/13  Renal ultrasound relative unremarkable  Informed by nursing regarding patient taking "herbs" from home.  Patient not to take any alternative medication at present-discussed  with patient.  Lasix added, DC'd on 1/21  Carvedilol added by nephro  Slightly labile on 1/21  Monitor with increased mobility 15. Hypoxia: Encourage IS.   Weaned oxygen to off as tolerated. 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 17.  Hypoalbuminemia  Supplement initiated on 1/11 18.  UTI  Urine culture with >100,000 E. coli, sensitivities pending  Empiric Keflex started on 1/20 to 50 mg daily, based on calculated creatinine clearance 19.  Oral blister  Oral gel ordered 20.  Hypokalemia  4.6 on 1/20  Supplement    LOS: 11 days A FACE TO FACE EVALUATION WAS PERFORMED  Ankit Lorie Phenix 04/30/2018, 8:38 AM

## 2018-05-01 ENCOUNTER — Inpatient Hospital Stay (HOSPITAL_COMMUNITY): Payer: Medicare Other | Admitting: Speech Pathology

## 2018-05-01 ENCOUNTER — Inpatient Hospital Stay (HOSPITAL_COMMUNITY): Payer: Medicare Other | Admitting: Occupational Therapy

## 2018-05-01 ENCOUNTER — Inpatient Hospital Stay (HOSPITAL_COMMUNITY): Payer: Medicare Other | Admitting: Physical Therapy

## 2018-05-01 ENCOUNTER — Encounter (HOSPITAL_COMMUNITY): Payer: Medicare Other | Admitting: Psychology

## 2018-05-01 DIAGNOSIS — R0989 Other specified symptoms and signs involving the circulatory and respiratory systems: Secondary | ICD-10-CM

## 2018-05-01 DIAGNOSIS — E875 Hyperkalemia: Secondary | ICD-10-CM

## 2018-05-01 LAB — RENAL FUNCTION PANEL
Albumin: 2.9 g/dL — ABNORMAL LOW (ref 3.5–5.0)
Anion gap: 7 (ref 5–15)
BUN: 40 mg/dL — ABNORMAL HIGH (ref 8–23)
CALCIUM: 9.8 mg/dL (ref 8.9–10.3)
CO2: 33 mmol/L — ABNORMAL HIGH (ref 22–32)
Chloride: 97 mmol/L — ABNORMAL LOW (ref 98–111)
Creatinine, Ser: 3.16 mg/dL — ABNORMAL HIGH (ref 0.44–1.00)
GFR calc Af Amer: 17 mL/min — ABNORMAL LOW (ref >60)
GFR, EST NON AFRICAN AMERICAN: 14 mL/min — AB (ref >60)
Glucose, Bld: 103 mg/dL — ABNORMAL HIGH (ref 70–99)
Phosphorus: 4.2 mg/dL (ref 2.5–4.6)
Potassium: 5.3 mmol/L — ABNORMAL HIGH (ref 3.5–5.1)
Sodium: 137 mmol/L (ref 135–145)

## 2018-05-01 LAB — GLUCOSE, CAPILLARY
GLUCOSE-CAPILLARY: 178 mg/dL — AB (ref 70–99)
Glucose-Capillary: 110 mg/dL — ABNORMAL HIGH (ref 70–99)
Glucose-Capillary: 178 mg/dL — ABNORMAL HIGH (ref 70–99)
Glucose-Capillary: 179 mg/dL — ABNORMAL HIGH (ref 70–99)

## 2018-05-01 MED ORDER — POTASSIUM CHLORIDE CRYS ER 20 MEQ PO TBCR
20.0000 meq | EXTENDED_RELEASE_TABLET | Freq: Two times a day (BID) | ORAL | Status: DC
  Administered 2018-05-01: 20 meq via ORAL
  Filled 2018-05-01 (×2): qty 1

## 2018-05-01 NOTE — Progress Notes (Signed)
Occupational Therapy Session Note  Patient Details  Name: Natalie Friedman MRN: [id number] Date of Birth: [birth year]  Today's Date: 05/01/2018 OT Individual Time: 1400-1445 OT Individual Time Calculation (min): 45 min    Short Term Goals: Week 1:  OT Short Term Goal 1 (Week 1): Pt will demonstrate improved sitting balance to bathe UB sitting EOB with S. OT Short Term Goal 1 - Progress (Week 1): Met OT Short Term Goal 2 (Week 1): Pt will demonstrate improved UE strength to be able to wash UB adequately with S. OT Short Term Goal 2 - Progress (Week 1): Met OT Short Term Goal 3 (Week 1): Pt will sit to stand to prepare for LB dressing with min - mod A. OT Short Term Goal 3 - Progress (Week 1): Met OT Short Term Goal 4 (Week 1): Pt will completed toilet transfer with mod A. OT Short Term Goal 4 - Progress (Week 1): Met OT Short Term Goal 5 (Week 1): Pt will demonstrate improved R hand coordination with being able to hold a utensil fully during eating. OT Short Term Goal 5 - Progress (Week 1): Met  Skilled Therapeutic Interventions/Progress Updates:    Patient in bed visiting with a friend, she denies pain and is eager to get OOB for therapy session.   Bed mobility with CS, tolerates sitting edge of bed with CS, SPT bed to w/c with min A.   Completed visual motor ROM exercises OS, OD, OU.  Right eye with impaired lower lid closure (appears red and irritated today)  Impaired downward mobility right eye, she noted diplopia, impaired acuity right eye but able to see facial features and objects in the environment.  Completed dexterity activities for Right hand - coloring, writing, tracing (she had most success with tracing task)  Returned to room, provided markers and paper for ongoing practice.  She is pleasant and cooperative, poor recall of strategies provided (will complete ongoing review).  She remained in the w/c with seat belt alarm set, friend and husband present.  Therapy  Documentation Precautions:  Precautions Precautions: Fall Precaution Comments: dizziness and nausea, h/o falls, double vision Other Brace: glasses with opaque occlusion on right lens of glasses Restrictions Weight Bearing Restrictions: No General:   Vital Signs: Therapy Vitals Temp: 97.9 F (36.6 C) Temp Source: Oral Pulse Rate: (!) 54 Resp: 20 BP: (!) 113/51 Patient Position (if appropriate): Sitting Oxygen Therapy SpO2: 97 % O2 Device: Room Air Pain: Pain Assessment Pain Scale: 0-10 Pain Score: 0-No pain   Other Treatments:     Therapy/Group: Individual Therapy  Carlos Levering 05/01/2018, 3:48 PM

## 2018-05-01 NOTE — Progress Notes (Signed)
[facility] PHYSICAL MEDICINE & REHABILITATION PROGRESS NOTE  Subjective/Complaints: Patient seen sitting up in her chair working with SLP, eating breakfast this morning.  She states she slept well overnight.  She denies complaints.  ROS: Denies CP, SOB, N/V/D  Objective: Vital Signs: Blood pressure (!) 153/84, pulse (!) 55, temperature 98.3 F (36.8 C), resp. rate 15, height 5\' 6"  (1.676 m), weight 100 kg, SpO2 92 %. No results found. Recent Labs    04/30/18 0726  WBC 8.2  HGB 11.8*  HCT 37.5  PLT 289   Recent Labs    04/30/18 0726 05/01/18 0658  NA 136 137  K 4.6 5.3*  CL 93* 97*  CO2 34* 33*  GLUCOSE 127* 103*  BUN 36* 40*  CREATININE 3.02* 3.16*  CALCIUM 10.1 9.8    Physical Exam: BP (!) 153/84 (BP Location: Left Arm)   Pulse (!) 55   Temp 98.3 F (36.8 C)   Resp 15   Ht 5\' 6"  (1.676 m)   Wt 100 kg   SpO2 92%   BMI 35.58 kg/m  Constitutional: No distress . Vital signs reviewed. HENT: Normocephalic.  Atraumatic. Eyes: EOMI. No discharge. Cardiovascular: RRR.  No JVD. Respiratory: CTA bilaterally.  Normal effort. GI: BS +. Non-distended. Musc: No edema or tenderness in extremities. Neurological: Alert Right facial weakness, stable Dysarthria, stable Motor: 4/5 prox to distal throughout, left stronger than right, improving Skin: Skin iswarm. Psychiatric: Pleasant and cooperative.  Assessment/Plan: 1. 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  Physiatrist is providing close team supervision and 24 hour management of active medical problems listed below.  Physiatrist and rehab team continue to assess barriers to discharge/monitor patient progress toward functional and medical goals  Care Tool:  Bathing    Body parts bathed by patient: Left arm, Right arm, Chest, Abdomen, Front perineal area, Buttocks, Right upper leg, Left upper  leg, Right lower leg, Face, Left lower leg   Body parts bathed by helper: Front perineal area, Buttocks     Bathing assist Assist Level: Minimal Assistance - Patient > 75%     Upper Body Dressing/Undressing Upper body dressing   What is the patient wearing?: Pull over shirt    Upper body assist Assist Level: Supervision/Verbal cueing    Lower Body Dressing/Undressing Lower body dressing      What is the patient wearing?: Pants, Incontinence brief     Lower body assist Assist for lower body dressing: Minimal Assistance - Patient > 75%     Toileting Toileting    Toileting assist Assist for toileting: Minimal Assistance - Patient > 75%     Transfers Chair/bed transfer  Transfers assist     Chair/bed transfer assist level: Moderate Assistance - Patient 50 - 74%     Locomotion Ambulation   Ambulation assist      Assist level: Moderate Assistance - Patient 50 - 74% Assistive device: Walker-rolling Max distance: 5 ft   Walk 10 feet activity   Assist  Walk 10 feet activity did not occur: Safety/medical concerns  Assist level: Minimal Assistance - Patient > 75% Assistive device: Walker-rolling   Walk 50 feet activity   Assist Walk 50 feet with 2 turns activity did not occur: Safety/medical concerns         Walk 150 feet activity   Assist Walk 150 feet activity did not occur: Safety/medical concerns  Walk 10 feet on uneven surface  activity   Assist Walk 10 feet on uneven surfaces activity did not occur: Safety/medical concerns         Wheelchair     Assist Will patient use wheelchair at discharge?: Yes Type of Wheelchair: Manual    Wheelchair assist level: Minimal Assistance - Patient > 75% Max wheelchair distance: 100 ft     Wheelchair 50 feet with 2 turns activity    Assist    Wheelchair 50 feet with 2 turns activity did not occur: Safety/medical concerns   Assist Level: Supervision/Verbal cueing    Wheelchair 150 feet activity     Assist Wheelchair 150 feet activity did not occur: Safety/medical concerns   Assist Level: Supervision/Verbal cueing      Medical Problem List and Plan: 1.Functional deficits and truncal/limb ataxiasecondary to vestibular schwannoma s/p crani/resection  Continue CIR   Team conference today to discuss current and goals and coordination of care, home and environmental barriers, and discharge planning with nursing, case manager, and therapies.  2. DVT Prophylaxis/Anticoagulation: Pharmaceutical:Lovenox 3.Chronic back pain/Pain Management:Tylenol prn 4. Mood:LCSW to follow for evaluation and support. 5. Neuropsych: This patientiscapable of making decisions on herown behalf. 6. Skin/Wound Care:Routine pressure relief measures. Monitor wound for healing. 7. Fluids/Electrolytes/Nutrition:Monitor I/O.   D1 thins, advance diet as tolerated 8. T2DM:Hgb A1c- 6.5.Monitor BS ac/hs. Use SSI for elevated BS--is on very limited diet.  Amaryl 1 mg started on 1/15  Labile on 1/22 9. Acute on chronic renal disease: Baseline SCr- 2.4--->2.54.  Patient states she was told she would need dialysis at the age of 69, but has been managing with diet control.  Creatinine 3.16 on 1/22   Continue to monitor 10. Occult spina bifida with neuropathy: Managed with OTC supplements. 11. Leucocytosis: Monitor for signs of infection. 12. Acute blood loss anemia:  Hemoglobin 11.8 on 1/21  Continue to monitor 13. Vestibular dysfunction: N/V with dizzinessimproving. 14. HTN: BPstill labile---Monitor BP bid. Continue Avapro and Norvasc.  Hydralizine 25 TID started on 1/12, DC'd on 1/13  Renal ultrasound relative unremarkable  Informed by nursing regarding patient taking "herbs" from home.  Patient not to take any alternative medication at present-discussed with patient.  Lasix added, DC'd on 1/21  Carvedilol added by nephro  Labile on 1/22  Monitor  with increased mobility 15. Hypoxia: Encourage IS.   Weaned oxygen to off as tolerated. 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 17.  Hypoalbuminemia  Supplement initiated on 1/11 18.  UTI  Urine culture with >100,000 E. coli  Continue Keflex started on 1/20 250 mg daily, based on calculated creatinine clearance 19.  Oral blister  Oral gel ordered 20.  Hypokalemia  5.3 on 1/22  Supplement decrease, discussed with pharmacy   LOS: 12 days A FACE TO FACE EVALUATION WAS PERFORMED   Lorie Phenix 05/01/2018, 8:48 AM

## 2018-05-01 NOTE — Progress Notes (Signed)
Occupational Therapy Session Note  Patient Details  Name: Osha Rane MRN: [id number] Date of Birth: 1950-11-10  Today's Date: 05/01/2018 OT Individual Time: 1001-1057 OT Individual Time Calculation (min): 56 min    Short Term Goals: Week 2:  OT Short Term Goal 1 (Week 2): Pt will transfer to toilet wiht MIN A and LRAD OT Short Term Goal 2 (Week 2): Pt will advance pant past hips wiht S  OT Short Term Goal 3 (Week 2): Pt will complete 3/3 components of toileting wiht CGA OT Short Term Goal 4 (Week 2): Pt will wash 50% of body with RUE to demo improve control and strength in RUE  Skilled Therapeutic Interventions/Progress Updates:    Pt worked on bathing and dressing sit to stand at the sink.  She needed mod instructional cueing to sequence bathing and remember to wash her arms and legs.  Min assist for sit to stand X 2 when washing her buttocks and peri area.  She demonstrates LOB posteriorly in standing with limited endurance of only 30-45 seconds of standing with each interval.  She was able to complete UB dressing with supervision and LB dressing with min assist sit to stand.  She is still reporting nausea and dizziness as well with vomiting X 1 at end of session.  Pt left in the wheelchair with call button and phone in reach with alarm belt in place.    Therapy Documentation Precautions:  Precautions Precautions: Fall Precaution Comments: dizziness and nausea, h/o falls, double vision Other Brace: glasses with opaque occlusion on right lens of glasses Restrictions Weight Bearing Restrictions: No   Pain: Pain Assessment Pain Scale: 0-10 Pain Score: 2  Pain Type: Acute pain Pain Location: Shoulder Pain Orientation: Right Pain Descriptors / Indicators: Discomfort Pain Onset: On-going Pain Intervention(s): Repositioned;Emotional support ADL: See Care Tool Section for some details of ADL  Therapy/Group: Individual Therapy  Zohaib Heeney OTR/L 05/01/2018, 10:57 AM

## 2018-05-01 NOTE — Progress Notes (Addendum)
Speech Language Pathology Daily Session Note  Patient Details  Name: Natalie Friedman MRN: [id number] Date of Birth: 1950/05/31  Today's Date: 05/01/2018 SLP Individual Time: 0730-0800 SLP Individual Time Calculation (min): 30 min  Short Term Goals: Week 2: SLP Short Term Goal 1 (Week 2): Pt will demonstrate speech intelligibility strategies at sentence level with 80% intelligibility given supervision verbal cues. SLP Short Term Goal 2 (Week 2): Pt will consume dys 1 and thin liquid via straw with min overt s/s aspiration and supervision verbal cues for swallow strategies.  SLP Short Term Goal 2 - Progress (Week 2): Met SLP Short Term Goal 3 (Week 2): Pt will consume trials of dys 2 textured foods with min overt s/s aspiration and min A verbal cues for swallow strategies.  SLP Short Term Goal 4 (Week 2): Pt will complete semi-complex functional tasks with supervision for functional problem solving.   SLP Short Term Goal 5 (Week 2): Pt will use external aids to recall semi-complex daily information with supervision.    Skilled Therapeutic Interventions:  Skilled treatment session focused on dysphagia and cognition goals. SLP facilitated session by providing skilled observation of pt consuming dysphagia 2 breakfast with thin liquids. Pt requested transferring to wheelchair. Pt able to transfer with supervision cues for cognitive sequencing.Pt with increased mastication (increased efficiency) and improved oral clearing. Pt is appropriate for upgrade to dysphagia 2. Pt required supervision cues to recall daily information. Pt was left upright in wheelchair and was handed off to nursing for continued supervision for consumption of remaining breakfast.      Pain Pain Assessment Pain Scale: 0-10 Pain Score: 0-No pain  Therapy/Group: Individual Therapy  Rito Lecomte 05/01/2018, 10:37 AM

## 2018-05-01 NOTE — Progress Notes (Signed)
Nutrition Follow-up  DOCUMENTATION CODES:   Obesity unspecified  INTERVENTION:   - Continue [employer] Standard 1.0 cal formula PO BID, each supplement provides 325 kcal and 16 grams of protein  - Family may bring in food from home/outside that is compliant with dysphagia diet consistency  NUTRITION DIAGNOSIS:   Increased nutrient needs related to chronic illness (CHF) as evidenced by estimated needs.  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 Post with right facial palsy and dysarthria.   Noted diet upgraded to dysphagia 2 with thin liquids this morning.  Spoke with pt at bedside who reports being happy with diet advancement to dysphagia 2. Pt endorses vomiting this morning and states that it is related to movement and/or medication. Pt reports that this typically happens daily but did not happen yesterday.  Pt reports drinking most [employer] supplements but states, "I need oat milk." Encouraged pt to have family bring in food from outside/home.  Pt shares that she completed 100% of breakfast meal. RD encouraged continued adequate PO intake.  Meal Completion: 20-100% x last 4 meals (average 83%)  Medications reviewed and include: vitamin D-3, Colace, Lasix, SSI, [employer] BID, Miralax, K-dur 20 mEq BID, vitamin B-12 500 mcg daily, zinc sulfate 220 mg daily  Labs reviewed: potassium 5.3 (H), BUN 40  (H), creatinine 3.16 (H) CBG's: 110, 197, 163, 271 x 24 hours  UOP: 2750 ml x 24 hours   Diet Order:   Diet Order            DIET DYS 2 Room service appropriate? Yes; Fluid consistency: Thin  Diet effective now              EDUCATION NEEDS:   Not appropriate for education at this time  Skin:  Skin Assessment: Skin Integrity Issues: Incisions: R head  Last BM:  1/21 (medium type 4)  Height:   Ht Readings from Last 1 Encounters:  04/19/18 5\' 6"  (1.676 m)    Weight:   Wt Readings from Last 1 Encounters:  04/27/18 100 kg    Ideal Body Weight:  59 kg  BMI:  Body mass index is 35.58 kg/m.  Estimated Nutritional Needs:   Kcal:  1850-2050  Protein:  90-105 grams  Fluid:  >/= 1.8 L/day    Gaynell Face, MS, RD, LDN Inpatient Clinical Dietitian Pager: [phone] Weekend/After Hours: [phone]

## 2018-05-01 NOTE — Progress Notes (Signed)
Physical Therapy Session Note  Patient Details  Name: Natalie Friedman MRN: [id number] Date of Birth: 12/10/1950  Today's Date: 05/01/2018 PT Individual Time: 1610-1705 PT Individual Time Calculation (min): 55 min   Short Term Goals: Week 1:  PT Short Term Goal 1 (Week 1): Pt will increase bed mobility to c/g. PT Short Term Goal 1 - Progress (Week 1): Met PT Short Term Goal 2 (Week 1): Pt will increase transfers to min A.  PT Short Term Goal 2 - Progress (Week 1): Met PT Short Term Goal 3 (Week 1): Pt will ambulate with LRAD about 50 feet PT Short Term Goal 3 - Progress (Week 1): Progressing toward goal PT Short Term Goal 4 (Week 1): Pt will ascend/descend 4 stairs with B rails. PT Short Term Goal 4 - Progress (Week 1): Progressing toward goal PT Short Term Goal 5 (Week 1): Pt will propel w/c about 100 feet with S.  PT Short Term Goal 5 - Progress (Week 1): Met Week 2:  PT Short Term Goal 1 (Week 2): Pt will perform bed mobility with supervision assist consistently  PT Short Term Goal 2 (Week 2): Pt will perform bed<>WC transfers with Supervision assist  PT Short Term Goal 3 (Week 2): Pt will ambulate 40f with min assist from PT and LRAD  PT Short Term Goal 4 (Week 2): Pt will ascend 4 steps with BUE support and min assist from PT  Week 3:     Skilled Therapeutic Interventions/Progress Updates:   Pt received sitting in WC and agreeable to PT. Pt's husband present and requesting to be checked off to transfer wife to bathroom. PT instructed pt in sit<>stand and stand pivot transfer to the EOB with min assist x 2 with husband providing min assist on second attempt. Stand pivot transfer to and from Toilet with min assist from PT and then from husband, min cues from PT for guarding techniques and cueing pt to increase step length on the R.  WC mobility training through hall x 1044fwith Supervision assist from PT. Min cues for use of the RUE to prevent hitting obstacles on the R. Gait training  with RE 2 x 3047fith min assist and moderate cues for step width, step length, and AD management.  PT instructed pt in standing balance with 1 UE support on high low table while engaged in cognitive task of connect four. Pt abel to maintain standing x 2 minutes prior to rest break due to dizziness. Pt returned to room and performed stand pivot transfer to bed with min assist and RW. Sit>supine completed with supervision assist, and left supine in bed with call bell in reach and all needs met.        Therapy Documentation Precautions:  Precautions Precautions: Fall Precaution Comments: dizziness and nausea, h/o falls, double vision Other Brace: glasses with opaque occlusion on right lens of glasses Restrictions Weight Bearing Restrictions: No Vital Signs: Therapy Vitals Temp: 97.9 F (36.6 C) Temp Source: Oral Pulse Rate: (!) 54 Resp: 20 BP: (!) 113/51 Patient Position (if appropriate): Sitting Oxygen Therapy SpO2: 97 % O2 Device: Room Air Pain: Pain Assessment Pain Scale: 0-10 Pain Score: 0-No pain   Therapy/Group: Individual Therapy  AusLorie Phenix22/2020, 5:05 PM

## 2018-05-02 ENCOUNTER — Inpatient Hospital Stay (HOSPITAL_COMMUNITY): Payer: Medicare Other

## 2018-05-02 ENCOUNTER — Inpatient Hospital Stay (HOSPITAL_COMMUNITY): Payer: Medicare Other | Admitting: Occupational Therapy

## 2018-05-02 ENCOUNTER — Inpatient Hospital Stay (HOSPITAL_COMMUNITY): Payer: Medicare Other | Admitting: Speech Pathology

## 2018-05-02 ENCOUNTER — Inpatient Hospital Stay (HOSPITAL_COMMUNITY): Payer: Medicare Other | Admitting: Physical Therapy

## 2018-05-02 DIAGNOSIS — B9620 Unspecified Escherichia coli [E. coli] as the cause of diseases classified elsewhere: Secondary | ICD-10-CM

## 2018-05-02 DIAGNOSIS — N390 Urinary tract infection, site not specified: Secondary | ICD-10-CM

## 2018-05-02 LAB — RENAL FUNCTION PANEL
Albumin: 2.9 g/dL — ABNORMAL LOW (ref 3.5–5.0)
Anion gap: 10 (ref 5–15)
BUN: 41 mg/dL — ABNORMAL HIGH (ref 8–23)
CO2: 32 mmol/L (ref 22–32)
Calcium: 9.6 mg/dL (ref 8.9–10.3)
Chloride: 94 mmol/L — ABNORMAL LOW (ref 98–111)
Creatinine, Ser: 3.72 mg/dL — ABNORMAL HIGH (ref 0.44–1.00)
GFR calc Af Amer: 14 mL/min — ABNORMAL LOW (ref >60)
GFR calc non Af Amer: 12 mL/min — ABNORMAL LOW (ref >60)
Glucose, Bld: 97 mg/dL (ref 70–99)
Phosphorus: 4.4 mg/dL (ref 2.5–4.6)
Potassium: 5.5 mmol/L — ABNORMAL HIGH (ref 3.5–5.1)
Sodium: 136 mmol/L (ref 135–145)

## 2018-05-02 LAB — GLUCOSE, CAPILLARY
Glucose-Capillary: 100 mg/dL — ABNORMAL HIGH (ref 70–99)
Glucose-Capillary: 152 mg/dL — ABNORMAL HIGH (ref 70–99)
Glucose-Capillary: 171 mg/dL — ABNORMAL HIGH (ref 70–99)
Glucose-Capillary: 130 mg/dL — ABNORMAL HIGH (ref 70–99)

## 2018-05-02 MED ORDER — ACETAMINOPHEN 325 MG PO TABS: 325.0000 mg | ORAL_TABLET | ORAL | Status: DC | PRN

## 2018-05-02 MED ORDER — NAPHAZOLINE-GLYCERIN 0.012-0.2 % OP SOLN: 1.0000 [drp] | Freq: Four times a day (QID) | OPHTHALMIC | 0 refills | Status: AC

## 2018-05-02 NOTE — Progress Notes (Signed)
Physical Therapy Session Note  Patient Details  Name: Natalie Friedman MRN: [id number] Date of Birth: Apr 11, 1950  Today's Date: 05/02/2018 PT Individual Time: 0900-0928 PT Individual Time Calculation (min): 28 min   Short Term Goals: Week 2:  PT Short Term Goal 1 (Week 2): Pt will perform bed mobility with supervision assist consistently  PT Short Term Goal 2 (Week 2): Pt will perform bed<>WC transfers with Supervision assist  PT Short Term Goal 3 (Week 2): Pt will ambulate 74ft with min assist from PT and LRAD  PT Short Term Goal 4 (Week 2): Pt will ascend 4 steps with BUE support and min assist from PT   Skilled Therapeutic Interventions/Progress Updates:   Pt request to stay in room during session and wanted to work on balance. Session focused on addressing NMR for functional dynamic sitting and standing balance and coordination. Pt donned socks from seated position with figure 4 technique with supervision. Performed seated reaching tasks across body to R and L without back or side support with supervision and cues for follow through of task. In standing, engaged in bimanual tasks with stacking of cups and no UE support requiring min assist for balance and alternating toe taps x 3 reps each side and then again x 4 reps each side. Seated rest breaks needed between sets due to pt reporting fatigue. Pt with tendency for posterior LOB. Education and discussion on ways to incorporate balance activities into her activities she enjoys at home such as cooking, gardening, and beading.   Therapy Documentation Precautions:  Precautions Precautions: Fall Precaution Comments: dizziness and nausea, h/o falls, double vision Other Brace: glasses with opaque occlusion on right lens of glasses Restrictions Weight Bearing Restrictions: No  Pain: Denies pain.    Therapy/Group: Individual Therapy  Canary Brim Ivory Broad, PT, DPT, CBIS  05/02/2018, 9:48 AM

## 2018-05-02 NOTE — Patient Care Conference (Signed)
Inpatient RehabilitationTeam Conference and Plan of Care Update Date: 05/01/2018   Time: 2:45 PM    Patient Name: Natalie Friedman      Medical Record Number: [id number]  Date of Birth: [birth year] Sex: Female         Room/Bed: 4M06C/4M06C-01 Payor Info: Payor: [employer] MEDICARE / Plan: [employer] MEDICARE / Product Type: *No Product type* /    Admitting Diagnosis: Tumor Rsxn  Admit Date/Time:  04/19/2018  2:34 PM Admission Comments: No comment available   Primary Diagnosis:  <principal problem not specified> Principal Problem: <principal problem not specified>  Patient Active Problem List   Diagnosis Date Noted  . E. coli UTI   . Hyperkalemia   . Labile blood pressure   . AKI (acute kidney injury) ([place])   . Labile blood glucose   . Hypertension   . Acute lower UTI   . Diabetes mellitus type 2 in obese ([place])   . Dysphagia   . CKD (chronic kidney disease)   . Hypokalemia   . Hypertensive crisis   . 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 Hypoalbuminemia due to protein-calorie malnutrition ([place])   . Essential hypertension   . Acute blood loss anemia   . Vestibular dysfunction of right ear   . Diabetes mellitus type 2 in nonobese ([facility])   . Foul smelling urine   . Blister   . Supplemental oxygen dependent   . Brain mass 04/19/2018  . 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 Brain tumor ([place]) 04/16/2018  . Cellulitis and abscess of right leg   . 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 Primary osteoarthritis of right knee   . Nephrotic syndrome 05/15/2016  . CHF (congestive heart failure) ([place]) 05/12/2016  . DM (diabetes mellitus), type 2 with renal complications ([place]) 10/01/7626  . Hypertensive urgency 05/12/2016  . anasarca 05/12/2016  . Immobility 05/12/2016  . Proteinuria 05/12/2016  . Uncontrolled hypertension 08/19/2015    Expected Discharge Date: Expected Discharge Date: 05/10/18  Team Members Present: Physician  leading conference: Dr. Delice Lesch Social Worker Present: Lennart Pall, LCSW Nurse Present: Blair Heys, RN PT Present: Barrie Folk, PT OT Present: Clyda Greener, OT SLP Present: Windell Moulding, SLP     Current Status/Progress Goal Weekly Team Focus  Medical   Functional deficits and truncal/limb ataxia secondary to vestibular schwannoma s/p crani/resection  Improve mobility, DM/HTN, AKI, UTI  See above   Bowel/Bladder   Patient is continent/incontinent B/B. I&O cath Q 6 for no void. LBM 04/30/2018.  Encourage timed toileting and non-pharm measures to encourage urination such as running water, etc.  Assist with toileting needs PRN.    Swallow/Nutrition/ Hydration   dys 1, thin liquids, min assist for use compensatory strategies (but can independently recall); trials of dys 2 this week req min assist - try full meal soon  supervision  carryover of swallowing strategies and hopeful to advance solids to dys 2   ADL's   Supervision for UB bathing and dressing with min assist for LB bathing and dressing.  Min to mod assist for stand pivot transfers to the walk-in shower and the toilet.  Min assist for toilet hygiene and clothing management as well.  Still with diplopia and dizziness with positional changes.  Limited endurance for standing usually less than 1-2 mins.  supervision level  selfcare retraining, transfer training, neuromuscular re-education, balance retraining, DME education   Mobility   min- supervision assist bed mobility. min assist sit<>stand and  stand pivot transfers. Min assist ambulation up to 37ft and intermittent Mod assist with turns due to lateral lean to the R.   Supervision assist overall with RW for house hold ambulation   improved endurance. nausea control, safety with transfers, gait, and bed mobility.    Communication   min assist for speech intelligibility   supervision  carryover of intelligibility strategies   Safety/Cognition/ Behavioral Observations  mod-max assist  for complex functional problem solving tasks  modified independent  functional problem solving and use of external memory aids   Pain   No complaints of pain.  Remain pain free.  Assess pain Q shift and PRN.   Skin   Patient has MASD to groin and peri area, ecchymosis to L arm, skin tear to R upper lip that is scabbed over, and incision to R back of head that is OTA.  Treat skin issues per orders. Maintain skin integrity and prevent skin breakdown.  Assess skin Q shift and PRN.      *See Care Plan and progress notes for long and short-term goals.     Barriers to Discharge  Current Status/Progress Possible Resolutions Date Resolved   Physician    Medical stability;Weight     See above  Therapies, optimize DM/HTN meds, abx for UTI, follow labs      Nursing                  PT                    OT                  SLP                SW                Discharge Planning/Teaching Needs:  Pt to d/c home with spouse who can provide 24/7 support.       Team Discussion:  BP improved but still labile;  Stopped lasix and addition of ABX for UTI.  Supervision UP and min LB b/d;  C/o nausea - ?vestibular cause.  Now on Dys 2 and intelligibility somewhat improved.  Still having some anxiety and needs cues for all transfers.  Min assist stand-pivot.  Some short distance ambulation which seems decreased in distance the past few days.  SW trying to follow up with spouse about care needs to determine if he will be able to meet these at dc.    Revisions to Treatment Plan:  NA    Continued Need for Acute Rehabilitation Level of Care: The patient requires daily medical management by a physician with specialized training in physical medicine and rehabilitation for the following conditions: Daily direction of a multidisciplinary physical rehabilitation program to ensure safe treatment while eliciting the highest outcome that is of practical value to the patient.: Yes Daily medical management of  patient stability for increased activity during participation in an intensive rehabilitation regime.: Yes Daily analysis of laboratory values and/or radiology reports with any subsequent need for medication adjustment of medical intervention for : Neurological problems;Renal problems;Blood pressure problems;Diabetes problems;Urological problems   I attest that I was present, lead the team conference, and concur with the assessment and plan of the team.   Anastaisa Wooding 05/02/2018, 1:30 PM

## 2018-05-02 NOTE — Progress Notes (Signed)
Occupational Therapy Session Note  Patient Details  Name: Natalie Friedman MRN: [id number] Date of Birth: February 02, 1951  Today's Date: 05/02/2018 OT Individual Time: 1000-1058 OT Individual Time Calculation (min): 58 min    Short Term Goals: Week 2:  OT Short Term Goal 1 (Week 2): Pt will transfer to toilet wiht MIN A and LRAD OT Short Term Goal 2 (Week 2): Pt will advance pant past hips wiht S  OT Short Term Goal 3 (Week 2): Pt will complete 3/3 components of toileting wiht CGA OT Short Term Goal 4 (Week 2): Pt will wash 50% of body with RUE to demo improve control and strength in RUE  Skilled Therapeutic Interventions/Progress Updates:    Pt worked on bathing, dressing, and grooming tasks during session.  She needed initial min assist for mobility from the wheelchair at bedside to the shower bench.  As she ambulated however she became more fatigued and demonstrated increased lean to the right.  By the time she reached the shower seat, she needed max assist to maintain her balance and safely sit down on the seat with max instructional cueing for backing up and placing hands back.  She undressed with min assist and then completed shower with min assist and mod instructional cueing for sequencing.  Once she finished she dried off sitting on the seat but became nauseas and vomited X 1.  She reported no more nausea after this episode for the remainder of grooming and dressing tasks.  Therapist transferred her to the wheelchair with min assist and took her out to the sink via wheelchair for dressing.  Min assist sit to stand for completion of dressing.  She then completed oral hygiene and brushing her teeth with setup.  Finished session with pt up in the wheelchair with call button and phone in reach and safety alarm belt in place.  Pt still reporting diplopia however with near vision it was only present intermittently in the left nasal portion for brief periods.  All other areas with near vision she reported  seeing only one object when asked to look at it.    Therapy Documentation Precautions:  Precautions Precautions: Fall Precaution Comments: dizziness and nausea, h/o falls, double vision Other Brace: glasses with opaque occlusion on right lens of glasses Restrictions Weight Bearing Restrictions: No  Pain: Pain Assessment Pain Scale: 0-10 Pain Score: 0-No pain ADL: See Care Tool Section for some details of ADL  Therapy/Group: Individual Therapy  Cannen Dupras OTR/L 05/02/2018, 10:58 AM

## 2018-05-02 NOTE — Progress Notes (Signed)
Speech Language Pathology Daily Session Note  Patient Details  Name: Natalie Friedman MRN: [id number] Date of Birth: [birth year]  Today's Date: 05/02/2018 SLP Individual Time: 2778-2423 SLP Individual Time Calculation (min): 45 min  Short Term Goals: Week 2: SLP Short Term Goal 1 (Week 2): Pt will demonstrate speech intelligibility strategies at sentence level with 80% intelligibility given supervision verbal cues. SLP Short Term Goal 2 (Week 2): Pt will consume dys 1 and thin liquid via straw with min overt s/s aspiration and supervision verbal cues for swallow strategies.  SLP Short Term Goal 2 - Progress (Week 2): Met SLP Short Term Goal 3 (Week 2): Pt will consume trials of dys 2 textured foods with min overt s/s aspiration and min A verbal cues for swallow strategies.  SLP Short Term Goal 4 (Week 2): Pt will complete semi-complex functional tasks with supervision for functional problem solving.   SLP Short Term Goal 5 (Week 2): Pt will use external aids to recall semi-complex daily information with supervision.    Skilled Therapeutic Interventions: Pt was seen for skilled ST focused on cognition. SLP facilitated session with a mildly complex card game involving strategy and problem solving skills. Pt was fully participatory and required min assist verbal cues for error detection and occasional rule repetition. In addition, pt often demonstrated ability to self-correct without assistance throughout the session. Pt was left sitting in wheelchair with brakes/chair alarm set and all needs met. Recommend continue per current plan of care.     Pain Pain Assessment Pain Scale: 0-10 Pain Score: 0-No pain  Therapy/Group: Individual Therapy  Jettie Booze, Student SLP  Jettie Booze 05/02/2018, 8:42 AM

## 2018-05-02 NOTE — Progress Notes (Signed)
Physical Therapy Session Note  Patient Details  Name: Natalie Friedman MRN: [id number] Date of Birth: [birth year]  Today's Date: 05/02/2018 PT Individual Time:1100-1200   60 min   Short Term Goals: Week 2:  PT Short Term Goal 1 (Week 2): Pt will perform bed mobility with supervision assist consistently  PT Short Term Goal 2 (Week 2): Pt will perform bed<>WC transfers with Supervision assist  PT Short Term Goal 3 (Week 2): Pt will ambulate 38f with min assist from PT and LRAD  PT Short Term Goal 4 (Week 2): Pt will ascend 4 steps with BUE support and min assist from PT  Week 3:     Skilled Therapeutic Interventions/Progress Updates:   Pt received sitting in WC and agreeable to PT  WC mobility x 1653fwith supervision assist and moderate cues from PT for equal use of BUE to improve obstacle navigation. Stair negotiation training x 4 with BUE support to go up/down 1 step. Min-mod cues for step to gait pattern, posture, and proper UE placement. Gait training with RW 2 x 2849fith min assist in straight path and mod assist in turn to sit. Nustep level 4 x 6 minutes with min cues for improve symmetry and full ROM. Sit<>stand and stand pivot transfers completed x 10 throughout treatment with CGA form PT for safety.  Pt required multiple prolonged rest breaks due to nausea, but no emesis throughout treatment.  Patient returned to room and left [facility] in [facility] in reach and all needs met.          Therapy Documentation Precautions:  Precautions Precautions: Fall Precaution Comments: dizziness and nausea, h/o falls, double vision Other Brace: glasses with opaque occlusion on right lens of glasses Restrictions Weight Bearing Restrictions: No Vital Signs: Therapy Vitals Temp: 97.8 F (36.6 C) Temp Source: Oral Pulse Rate: (!) 59 Resp: 19 BP: 133/69 Patient Position (if appropriate): Lying Oxygen Therapy SpO2: 90 % O2 Device: Room Air Pain: 0/10   Therapy/Group:  Individual Therapy  AusLorie Phenix23/2020, 8:01 AM

## 2018-05-02 NOTE — Progress Notes (Signed)
[facility] PHYSICAL MEDICINE & REHABILITATION PROGRESS NOTE  Subjective/Complaints: Patient seen laying in bed this morning.  She states she slept well overnight.  Encourage patient to take antiemetics after discussion with therapies.  ROS: Denies CP, SOB, N/V/D  Objective: Vital Signs: Blood pressure 133/69, pulse (!) 59, temperature 97.8 F (36.6 C), temperature source Oral, resp. rate 19, height 5\' 6"  (1.676 m), weight 101.5 kg, SpO2 90 %. No results found. Recent Labs    04/30/18 0726  WBC 8.2  HGB 11.8*  HCT 37.5  PLT 289   Recent Labs    05/01/18 0658 05/02/18 0551  NA 137 136  K 5.3* 5.5*  CL 97* 94*  CO2 33* 32  GLUCOSE 103* 97  BUN 40* 41*  CREATININE 3.16* 3.72*  CALCIUM 9.8 9.6    Physical Exam: BP 133/69 (BP Location: Right Arm)   Pulse (!) 59   Temp 97.8 F (36.6 C) (Oral)   Resp 19   Ht 5\' 6"  (1.676 m)   Wt 101.5 kg   SpO2 90%   BMI 36.12 kg/m  Constitutional: No distress . Vital signs reviewed. HENT: Normocephalic.  Atraumatic. Eyes: EOMI. No discharge. Cardiovascular: RRR.  No JVD. Respiratory: CTA bilaterally.  Normal effort. GI: BS +. Non-distended. Musc: No edema or tenderness in extremities. Neurological: Alert Right facial weakness, unchanged Dysarthria, stable Motor: 4/5 prox to distal throughout, left stronger than right, unchanged Skin: Skin iswarm. Psychiatric: Pleasant and cooperative.  Assessment/Plan: 1. 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  Physiatrist is providing close team supervision and 24 hour management of active medical problems listed below.  Physiatrist and rehab team continue to assess barriers to discharge/monitor patient progress toward functional and medical goals  Care Tool:  Bathing    Body parts bathed by patient: Right arm, Left arm, Chest, Abdomen, Front perineal area,  Buttocks, Right upper leg, Left upper leg, Left lower leg, Face, Right lower leg   Body parts bathed by helper: Front perineal area, Buttocks     Bathing assist Assist Level: Minimal Assistance - Patient > 75%     Upper Body Dressing/Undressing Upper body dressing   What is the patient wearing?: Pull over shirt    Upper body assist Assist Level: Set up assist    Lower Body Dressing/Undressing Lower body dressing      What is the patient wearing?: Pants, Incontinence brief     Lower body assist Assist for lower body dressing: Minimal Assistance - Patient > 75%     Toileting Toileting    Toileting assist Assist for toileting: Minimal Assistance - Patient > 75%     Transfers Chair/bed transfer  Transfers assist     Chair/bed transfer assist level: Minimal Assistance - Patient > 75%     Locomotion Ambulation   Ambulation assist      Assist level: Maximal Assistance - Patient 25 - 49% Assistive device: Walker-rolling Max distance: 12'   Walk 10 feet activity   Assist  Walk 10 feet activity did not occur: Safety/medical concerns  Assist level: Minimal Assistance - Patient > 75% Assistive device: Walker-rolling   Walk 50 feet activity   Assist Walk 50 feet with 2 turns activity did not occur: Safety/medical concerns         Walk 150 feet activity   Assist Walk 150 feet activity did not occur: Safety/medical concerns  Walk 10 feet on uneven surface  activity   Assist Walk 10 feet on uneven surfaces activity did not occur: Safety/medical concerns         Wheelchair     Assist Will patient use wheelchair at discharge?: Yes Type of Wheelchair: Manual    Wheelchair assist level: Supervision/Verbal cueing Max wheelchair distance: 173ft    Wheelchair 50 feet with 2 turns activity    Assist    Wheelchair 50 feet with 2 turns activity did not occur: Safety/medical concerns   Assist Level: Supervision/Verbal cueing    Wheelchair 150 feet activity     Assist Wheelchair 150 feet activity did not occur: Safety/medical concerns   Assist Level: Supervision/Verbal cueing      Medical Problem List and Plan: 1.Functional deficits and truncal/limb ataxiasecondary to vestibular schwannoma s/p crani/resection  Continue CIR  2. DVT Prophylaxis/Anticoagulation: Pharmaceutical:Lovenox 3.Chronic back pain/Pain Management:Tylenol prn 4. Mood:LCSW to follow for evaluation and support. 5. Neuropsych: This patientiscapable of making decisions on herown behalf. 6. Skin/Wound Care:Routine pressure relief measures. Monitor wound for healing. 7. Fluids/Electrolytes/Nutrition:Monitor I/O.   D1 thins, advance diet as tolerated 8. T2DM:Hgb A1c- 6.5.Monitor BS ac/hs. Use SSI for elevated BS--is on very limited diet.  Amaryl 1 mg started on 1/15  Labile on 1/23 9. Acute on chronic renal disease: Baseline SCr- 2.4--->2.54.  Patient states she was told she would need dialysis at the age of 7, but has been managing with diet control.  Creatinine 3.72 on 1/23  Encourage fluids  Continue to monitor 10. Occult spina bifida with neuropathy: Managed with OTC supplements. 11. Leucocytosis: Monitor for signs of infection. 12. Acute blood loss anemia:  Hemoglobin 11.8 on 1/21  Continue to monitor 13. Vestibular dysfunction: N/V with dizzinessimproving. 14. HTN: BPstill labile---Monitor BP bid. Continue Avapro and Norvasc.  Hydralizine 25 TID started on 1/12, DC'd on 1/13  Renal ultrasound relative unremarkable  Informed by nursing regarding patient taking "herbs" from home.  Patient not to take any alternative medication at present-discussed with patient.  Lasix added, DC'd on 1/21  Carvedilol added by nephro  Labile, but improved on 1/23  Monitor with increased mobility 15. Hypoxia: Encourage IS.   Weaned oxygen to off as tolerated. 16. Right CN VII palsy: Improving Continue  eye lubricants   Needs to keep eye covered when possible. 17.  Hypoalbuminemia  Supplement initiated on 1/11 18.  UTI  Urine culture with >100,000 E. coli  Continue Keflex started on 1/20 250 mg daily, based on calculated creatinine clearance 19.  Oral blister  Oral gel ordered 20.  Hypokalemia  5.5 on 1/23  Labs ordered for tomorrow  Supplement DC'd on 1/23   LOS: 13 days A FACE TO FACE EVALUATION WAS PERFORMED  Arlon Bleier Lorie Phenix 05/02/2018, 10:57 AM

## 2018-05-03 ENCOUNTER — Inpatient Hospital Stay (HOSPITAL_COMMUNITY): Payer: Medicare Other | Admitting: Speech Pathology

## 2018-05-03 ENCOUNTER — Inpatient Hospital Stay (HOSPITAL_COMMUNITY): Payer: Medicare Other | Admitting: Occupational Therapy

## 2018-05-03 ENCOUNTER — Inpatient Hospital Stay (HOSPITAL_COMMUNITY): Payer: Medicare Other | Admitting: Physical Therapy

## 2018-05-03 LAB — GLUCOSE, CAPILLARY
Glucose-Capillary: 117 mg/dL — ABNORMAL HIGH (ref 70–99)
Glucose-Capillary: 224 mg/dL — ABNORMAL HIGH (ref 70–99)
Glucose-Capillary: 185 mg/dL — ABNORMAL HIGH (ref 70–99)
Glucose-Capillary: 201 mg/dL — ABNORMAL HIGH (ref 70–99)

## 2018-05-03 LAB — RENAL FUNCTION PANEL
ANION GAP: 11 (ref 5–15)
Albumin: 2.9 g/dL — ABNORMAL LOW (ref 3.5–5.0)
BUN: 42 mg/dL — ABNORMAL HIGH (ref 8–23)
CO2: 32 mmol/L (ref 22–32)
Calcium: 9.7 mg/dL (ref 8.9–10.3)
Chloride: 93 mmol/L — ABNORMAL LOW (ref 98–111)
Creatinine, Ser: 3.82 mg/dL — ABNORMAL HIGH (ref 0.44–1.00)
GFR calc Af Amer: 13 mL/min — ABNORMAL LOW (ref >60)
GFR calc non Af Amer: 11 mL/min — ABNORMAL LOW (ref >60)
Glucose, Bld: 119 mg/dL — ABNORMAL HIGH (ref 70–99)
Phosphorus: 4.9 mg/dL — ABNORMAL HIGH (ref 2.5–4.6)
Potassium: 4.9 mmol/L (ref 3.5–5.1)
Sodium: 136 mmol/L (ref 135–145)

## 2018-05-03 NOTE — Progress Notes (Signed)
Social Work Patient ID: Natalie Friedman, female   DOB: 10/13/1950, 68 y.o.   MRN: [id number]   Met with pt and spouse yesterday afternoon.  Reviewed targeted d/c date of 1/31 and goals of overall supervision and possibly some light physical assistance.  Discussed need for family ed and spouse very receptive and we have planned for him to be here on Monday  9a-12p.  Therapists alerted for scheduling.  Spouse very supportive and denies any concerns about being able to provide 24/7 assistance to pt.  Will follow up with them on Monday as well.  Jeanne Diefendorf, LCSW

## 2018-05-03 NOTE — Progress Notes (Signed)
Physical Therapy Weekly Progress Note  Patient Details  Name: Natalie Friedman MRN: [id number] Date of Birth: July 22, 1950  Beginning of progress report period: April 26, 2018 End of progress report period: May 03, 2018  Today's Date: 05/03/2018 PT Individual Time:310 645 3729 AND 1645-1700   45 min and 60 min   Patient has met 3 of 4 short term goals.  Pt making slow progress towards long term goals. Poor midline orientation, continued dizziness and nausea limit increased progress at this time.Pt has progressed to supervision assist bed mobility, min-mod assist ambulation up to 47f with increased assist in turns due to poor motor planning and decreased ability to correct LOB.   Patient continues to demonstrate the following deficits muscle weakness and muscle joint tightness, decreased cardiorespiratoy endurance, impaired timing and sequencing, unbalanced muscle activation, decreased coordination and decreased motor planning, decreased visual acuity and decreased visual perceptual skills, decreased midline orientation and decreased attention to right, decreased attention, decreased awareness, decreased problem solving, decreased safety awareness and decreased memory, central origin and decreased sitting balance, decreased standing balance, decreased postural control, hemiplegia and decreased balance strategies and therefore will continue to benefit from skilled PT intervention to increase functional independence with mobility.  Patient progressing toward long term goals..  Continue plan of care.  PT Short Term Goals Week 2:  PT Short Term Goal 1 (Week 2): Pt will perform bed mobility with supervision assist consistently  PT Short Term Goal 1 - Progress (Week 2): Met PT Short Term Goal 2 (Week 2): Pt will perform bed<>WC transfers with Supervision assist  PT Short Term Goal 2 - Progress (Week 2): Progressing toward goal PT Short Term Goal 3 (Week 2): Pt will ambulate 530fwith min assist from PT  and LRAD  PT Short Term Goal 3 - Progress (Week 2): Met PT Short Term Goal 4 (Week 2): Pt will ascend 4 steps with BUE support and min assist from PT  PT Short Term Goal 4 - Progress (Week 2): Met Week 3:  PT Short Term Goal 1 (Week 3): STG=LTG due to ELOS   Skilled Therapeutic Interventions/Progress Updates:  session 1 Pt received sitting in WC and agreeable to PT. Gait training 5085fith RW and min fading to mod assist for the lat 10 ft due to increased R lateral lean and poor awareness of LOB. additional 44f39fth min assist and RW. Mod-max cues for gait pattern in turn to sit.   Dynamic standing balance to preform lateral reaches to the R and  L. 2 x 5 with min assist with reaching to the R inorder to correct LOB and improve trunk control. WC mobility x 120ft60fh supervision assist from PT and min cues for improved use of the R UE to prevent hitting wall on the R. Patient returned to room and left [facility] in [facility] call bell in reach and all needs met.    Session 2.  Pt received supine in bed and agreeable to PT. PT instructed pt in supine NMR. Bridges x 10, lumbar rotation stabilizing reversals 2 x 6 with 6 sec hold. UE D1 Reaching against resistance to faciliate rolling R and L.  X 5 Bil. Pelvic rotation dynamic reversals x 6 Bil. Pt left supine in bed with call bell in reach and all needs met.        Therapy Documentation Precautions:  Precautions Precautions: Fall Precaution Comments: dizziness and nausea, h/o falls, double vision Other Brace: glasses with opaque occlusion on right lens of glasses  Restrictions Weight Bearing Restrictions: (P) No    Pain: Pain Assessment Pain Scale: 0-10 Pain Score: 0-No pain   Therapy/Group: Individual Therapy  Lorie Phenix 05/03/2018, 10:46 AM

## 2018-05-03 NOTE — Plan of Care (Signed)
  Problem: RH SAFETY Goal: RH STG ADHERE TO SAFETY PRECAUTIONS W/ASSISTANCE/DEVICE Description STG Adhere to Safety Precautions With Assistance/Device. Mod  Outcome: Progressing   Call light within reach, bed/chair alarm,

## 2018-05-03 NOTE — Progress Notes (Signed)
[place] PHYSICAL MEDICINE & REHABILITATION PROGRESS NOTE  Subjective/Complaints: Patient seen sitting up in her chair this morning.  She states she slept well overnight.  She states she feels better.  ROS: Denies CP, SOB, N/V/D  Objective: Vital Signs: Blood pressure 135/62, pulse 65, temperature 98.6 F (37 C), temperature source Oral, resp. rate 16, height 5\' 6"  (1.676 m), weight 101.5 kg, SpO2 94 %. No results found. No results for input(s): WBC, HGB, HCT, PLT in the last 72 hours. Recent Labs    05/02/18 0551 05/03/18 0654  NA 136 136  K 5.5* 4.9  CL 94* 93*  CO2 32 32  GLUCOSE 97 119*  BUN 41* 42*  CREATININE 3.72* 3.82*  CALCIUM 9.6 9.7    Physical Exam: BP 135/62 (BP Location: Left Arm)   Pulse 65   Temp 98.6 F (37 C) (Oral)   Resp 16   Ht 5\' 6"  (1.676 m)   Wt 101.5 kg   SpO2 94%   BMI 36.12 kg/m  Constitutional: No distress . Vital signs reviewed. HENT: Normocephalic.  Atraumatic. Eyes: EOMI. No discharge. Cardiovascular: RRR.  No JVD. Respiratory: CTA bilaterally.  Normal effort. GI: BS +. Non-distended. Musc: No edema or tenderness in extremities. Neurological: Alert Facial weakness unchanged Dysarthria, unchanged Motor: 4/5 prox to distal throughout, left stronger than right, unchanged Skin: Skin iswarm. Psychiatric: Pleasant and cooperative.  Assessment/Plan: 1. 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  Physiatrist is providing close team supervision and 24 hour management of active medical problems listed below.  Physiatrist and rehab team continue to assess barriers to discharge/monitor patient progress toward functional and medical goals  Care Tool:  Bathing    Body parts bathed by patient: Right arm, Left arm, Chest, Abdomen, Front perineal area, Buttocks, Right upper leg, Left upper leg, Left lower leg, Face,  Right lower leg   Body parts bathed by helper: Front perineal area, Buttocks     Bathing assist Assist Level: Minimal Assistance - Patient > 75%     Upper Body Dressing/Undressing Upper body dressing   What is the patient wearing?: Pull over shirt    Upper body assist Assist Level: Set up assist    Lower Body Dressing/Undressing Lower body dressing      What is the patient wearing?: Pants, Incontinence brief     Lower body assist Assist for lower body dressing: Minimal Assistance - Patient > 75%     Toileting Toileting    Toileting assist Assist for toileting: Minimal Assistance - Patient > 75%     Transfers Chair/bed transfer  Transfers assist     Chair/bed transfer assist level: Minimal Assistance - Patient > 75%     Locomotion Ambulation   Ambulation assist      Assist level: Maximal Assistance - Patient 25 - 49% Assistive device: Walker-rolling Max distance: 12'   Walk 10 feet activity   Assist  Walk 10 feet activity did not occur: Safety/medical concerns  Assist level: Minimal Assistance - Patient > 75% Assistive device: Walker-rolling   Walk 50 feet activity   Assist Walk 50 feet with 2 turns activity did not occur: Safety/medical concerns         Walk 150 feet activity   Assist Walk 150 feet activity did not occur: Safety/medical concerns         Walk 10 feet on uneven surface  activity   Assist Walk 10  feet on uneven surfaces activity did not occur: Safety/medical concerns         Wheelchair     Assist Will patient use wheelchair at discharge?: Yes Type of Wheelchair: Manual    Wheelchair assist level: Supervision/Verbal cueing Max wheelchair distance: 114ft    Wheelchair 50 feet with 2 turns activity    Assist    Wheelchair 50 feet with 2 turns activity did not occur: Safety/medical concerns   Assist Level: Supervision/Verbal cueing   Wheelchair 150 feet activity     Assist Wheelchair 150  feet activity did not occur: Safety/medical concerns   Assist Level: Supervision/Verbal cueing      Medical Problem List and Plan: 1.Functional deficits and truncal/limb ataxiasecondary to vestibular schwannoma s/p crani/resection  Continue CIR  2. DVT Prophylaxis/Anticoagulation: Pharmaceutical:Lovenox 3.Chronic back pain/Pain Management:Tylenol prn 4. Mood:LCSW to follow for evaluation and support. 5. Neuropsych: This patientiscapable of making decisions on herown behalf. 6. Skin/Wound Care:Routine pressure relief measures. Monitor wound for healing. 7. Fluids/Electrolytes/Nutrition:Monitor I/O.   D1 thins, advance diet as tolerated 8. T2DM:Hgb A1c- 6.5.Monitor BS ac/hs. Use SSI for elevated BS--is on very limited diet.  Amaryl 1 mg started on 1/15  Labile on 1/24 9. Acute on chronic renal disease: Baseline SCr- 2.4--->2.54.  Patient states she was told she would need dialysis at the age of 33, but has been managing with diet control.  Creatinine 3.82 on 1/24  Encourage fluids  Continue to monitor 10. Occult spina bifida with neuropathy: Managed with OTC supplements. 11. Leucocytosis: Monitor for signs of infection. 12. Acute blood loss anemia:  Hemoglobin 11.8 on 1/21  Continue to monitor 13. Vestibular dysfunction: N/V with dizzinessimproving. 14. HTN: BPstill labile---Monitor BP bid. Continue Avapro and Norvasc.  Hydralizine 25 TID started on 1/12, DC'd on 1/13  Renal ultrasound relative unremarkable  Informed by nursing regarding patient taking "herbs" from home.  Patient not to take any alternative medication at present-discussed with patient.  Lasix added, DC'd on 1/21  Carvedilol added by nephro  Relatively controlled on 1/24  Monitor with increased mobility 15. Hypoxia: Encourage IS.   Weaned oxygen to off as tolerated. 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 17.   Hypoalbuminemia  Supplement initiated on 1/11 18.  UTI  Urine culture with >100,000 E. coli  Continue Keflex started on 1/20 250 mg daily, based on calculated creatinine clearance 19.  Oral blister  Oral gel ordered 20.  Hypokalemia  4.9 on 1/24  Supplement DC'd on 1/23   LOS: 14 days A FACE TO FACE EVALUATION WAS PERFORMED  Ankit Lorie Phenix 05/03/2018, 9:31 AM

## 2018-05-03 NOTE — Progress Notes (Signed)
Speech Language Pathology Weekly Progress and Session Note  Patient Details  Name: Natalie Friedman MRN: [id number] Date of Birth: 05/12/1950  Beginning of progress report period: April 26, 2018 End of progress report period: May 03, 2018  Today's Date: 05/03/2018 SLP Individual Time: 0805-0850 SLP Individual Time Calculation (min): 45 min  Short Term Goals: Week 2: SLP Short Term Goal 1 (Week 2): Pt will demonstrate speech intelligibility strategies at sentence level with 80% intelligibility given supervision verbal cues. SLP Short Term Goal 1 - Progress (Week 2): Met SLP Short Term Goal 2 (Week 2): Pt will consume dys 1 and thin liquid via straw with min overt s/s aspiration and supervision verbal cues for swallow strategies.  SLP Short Term Goal 2 - Progress (Week 2): Met SLP Short Term Goal 3 (Week 2): Pt will consume trials of dys 2 textured foods with min overt s/s aspiration and min A verbal cues for swallow strategies.  SLP Short Term Goal 3 - Progress (Week 2): Met SLP Short Term Goal 4 (Week 2): Pt will complete semi-complex functional tasks with supervision for functional problem solving.   SLP Short Term Goal 4 - Progress (Week 2): Progressing toward goal SLP Short Term Goal 5 (Week 2): Pt will use external aids to recall semi-complex daily information with supervision.   SLP Short Term Goal 5 - Progress (Week 2): Other (comment)(not targeted during this reporting period)    New Short Term Goals: Week 3: SLP Short Term Goal 1 (Week 3): Pt will demonstrate speech intelligibility strategies at sentence level with 90% intelligibility with Mod I assist. SLP Short Term Goal 2 (Week 3): Pt will consume dys 2 and thin liquid via straw with min overt s/s aspiration and supervision verbal cues for swallow strategies.  SLP Short Term Goal 3 (Week 3): Pt will complete semi-complex functional tasks with min assist for functional problem solving.   SLP Short Term Goal 4 (Week 3): Pt  will use external aids to recall semi-complex daily information with supervision.   Weekly Progress Updates: Pt has made excellent gains this reporting period and has met 3 out of 5 short term goals.  Pt is currently min assist for tasks due to cognitive deficits and left oral weakness contributing to oral dysphagia.  Pt has demonstrated improved use of compensatory strategies for both swallowing and speech intelligibility.  Pt diet was advanced, and she is currently consuming dysphagia 2 textures, thin liquids with min cues for use of swallowing precautions.  Pt and family education is ongoing; SLP to educate pt's husband next week.  Pt would continue to benefit from skilled ST while inpatient in order to maximize functional independence and reduce burden of care prior to discharge.  Anticipate that pt will need 24/7 supervision at discharge in addition to [place] follow at next level of care.     Intensity: Minumum of 1-2 x/day, 30 to 90 minutes Frequency: 3 to 5 out of 7 days Duration/Length of Stay: 13-19 days  Treatment/Interventions: English as a second language teacher;Dysphagia/aspiration precaution training;Patient/family education;Speech/Language facilitation;Cognitive remediation/compensation;Functional tasks;Environmental controls;Internal/external aids   Daily Session  Skilled Therapeutic Interventions: Pt was seen for skilled ST targeting dysphagia and speech intelligibility today. Upon our greeting pt expressed feeling depressed; clinician encouraged pt to identify the contributing factors for her depression and provided active listening/encouragement. When consuming dysphagia 2 (soft/fine chop) textured fruits and thin liquids, pt required min assist verbal cues to utilize compensatory swallow strategies, particularly with liquid consumption. No overt s/s aspiration were observed. Pt independently  recalled 2 out of 3 speech intelligibility strategies and demonstrated effective use of all 3 during a associated  activities. She exhibited approximately 85-90% intelligible speech at the sentence level with supervision verbal cues. Pt was left sitting in wheelchair with chair alarm set and all needs met. Recommend continue per current plan of care.      General    Pain Pain Assessment Pain Scale: 0-10 Pain Score: 0-No pain  Therapy/Group: Individual Therapy   Jettie Booze, Student SLP   Jettie Booze 05/03/2018, 12:25 PM

## 2018-05-03 NOTE — Progress Notes (Signed)
Occupational Therapy Session Note  Patient Details  Name: Natalie Friedman MRN: [id number] Date of Birth: 1950/04/11  Today's Date: 05/03/2018 OT Individual Time: 2707-8675 OT Individual Time Calculation (min): 72 min    Short Term Goals: Week 2:  OT Short Term Goal 1 (Week 2): Pt will transfer to toilet wiht MIN A and LRAD OT Short Term Goal 2 (Week 2): Pt will advance pant past hips wiht S  OT Short Term Goal 3 (Week 2): Pt will complete 3/3 components of toileting wiht CGA OT Short Term Goal 4 (Week 2): Pt will wash 50% of body with RUE to demo improve control and strength in RUE  Skilled Therapeutic Interventions/Progress Updates:    Patient in bed upon arrival.  She denies pain and is eager to participate in therapy session.  UB dressing - set up seated edge of bed.  Bed mobility with CG and cues for safety.  toileting - min A pants down, dependent for hygiene and mod A for pants up due to fatigue.  SPT to/from bed, w/c, toilet, mat with min/mod A cues for weight shift and safe reaching.  Tolerated unsupported sitting on mat for 30 minutes while completing hand dexterity/design copy tasks/visual scanning activities, beading, UB/trunk ROM  and theraputty activities.  Standing x3 for approx 30 seconds each attempt with CG A.  She did not experience any nausea during session.  Patient is pleasant and cooperative t/o session but limited by fatigue this afternoon.  Returned to bed for a rest break before afternoon PT session.  Bed alarm set and family present.    Therapy Documentation Precautions:  Precautions Precautions: Fall Precaution Comments: dizziness and nausea, h/o falls, double vision Other Brace: glasses with opaque occlusion on right lens of glasses Restrictions Weight Bearing Restrictions: No General:   Vital Signs: Therapy Vitals Temp: 98.3 F (36.8 C) Temp Source: Oral Pulse Rate: 76 Resp: 20 BP: 117/62 Patient Position (if appropriate): Lying Oxygen Therapy SpO2: 98  % O2 Device: Room Air Pain: Pain Assessment Pain Scale: 0-10 Pain Score: 0-No pain   Therapy/Group: Individual Therapy  Carlos Levering 05/03/2018, 3:33 PM

## 2018-05-04 ENCOUNTER — Inpatient Hospital Stay (HOSPITAL_COMMUNITY): Payer: Medicare Other | Admitting: Physical Therapy

## 2018-05-04 DIAGNOSIS — D3610 Benign neoplasm of peripheral nerves and autonomic nervous system, unspecified: Secondary | ICD-10-CM

## 2018-05-04 LAB — GLUCOSE, CAPILLARY
Glucose-Capillary: 97 mg/dL (ref 70–99)
Glucose-Capillary: 173 mg/dL — ABNORMAL HIGH (ref 70–99)
Glucose-Capillary: 212 mg/dL — ABNORMAL HIGH (ref 70–99)
Glucose-Capillary: 155 mg/dL — ABNORMAL HIGH (ref 70–99)

## 2018-05-04 LAB — RENAL FUNCTION PANEL
Albumin: 2.7 g/dL — ABNORMAL LOW (ref 3.5–5.0)
Anion gap: 10 (ref 5–15)
BUN: 49 mg/dL — ABNORMAL HIGH (ref 8–23)
CO2: 34 mmol/L — ABNORMAL HIGH (ref 22–32)
Calcium: 9.3 mg/dL (ref 8.9–10.3)
Chloride: 91 mmol/L — ABNORMAL LOW (ref 98–111)
Creatinine, Ser: 3.79 mg/dL — ABNORMAL HIGH (ref 0.44–1.00)
GFR calc Af Amer: 13 mL/min — ABNORMAL LOW (ref >60)
GFR calc non Af Amer: 11 mL/min — ABNORMAL LOW (ref >60)
Glucose, Bld: 95 mg/dL (ref 70–99)
Phosphorus: 5.4 mg/dL — ABNORMAL HIGH (ref 2.5–4.6)
Potassium: 5.1 mmol/L (ref 3.5–5.1)
Sodium: 135 mmol/L (ref 135–145)

## 2018-05-04 NOTE — Progress Notes (Signed)
Physical Therapy Session Note  Patient Details  Name: Natalie Friedman MRN: [id number] Date of Birth: July 14, 1950  Today's Date: 05/04/2018 PT Individual Time: 1430-1530 PT Individual Time Calculation (min): 60 min   Short Term Goals: Week 3:  PT Short Term Goal 1 (Week 3): STG=LTG due to ELOS   Skilled Therapeutic Interventions/Progress Updates:    Pt received seated in bed, agreeable to PT but requests to perform session in her room due to ongoing nausea. No complaints of pain. Supine to sit with min A. Pt has some increase in nausea while seated EOB, requests to return to supine for several minutes. Pt feels better with supine rest break. Returns to sitting EOB with min A. Sit to stand with min A to RW. Pt is able to take sidesteps to the L with RW and min A towards HOB. Sit to supine Supervision. Supine BLE therex x 10 reps: heel slides, hip abd, SKFO, SLR, bridges. Pt agreeable to sit up in w/c at end of session. Stand pivot transfer with min A and RW. Pt left seated in w/c in room with needs in reach, quick release belt in place, husband present.  Therapy Documentation Precautions:  Precautions Precautions: Fall Precaution Comments: dizziness and nausea, h/o falls, double vision Other Brace: glasses with opaque occlusion on right lens of glasses Restrictions Weight Bearing Restrictions: No   Therapy/Group: Individual Therapy  Excell Seltzer, PT, DPT  05/04/2018, 3:32 PM

## 2018-05-04 NOTE — Plan of Care (Signed)
  Problem: RH BOWEL ELIMINATION Goal: RH STG MANAGE BOWEL WITH ASSISTANCE Description STG Manage Bowel with Assistance. mod  Outcome: Progressing   Problem: RH BLADDER ELIMINATION Goal: RH STG MANAGE BLADDER WITH ASSISTANCE Description STG Manage Bladder With Assistance mod  Outcome: Progressing   Problem: RH SKIN INTEGRITY Goal: RH STG SKIN FREE OF INFECTION/BREAKDOWN Description Remain free of skin breakdown and infection while on rehab with mod assist  Outcome: Progressing   Problem: RH SAFETY Goal: RH STG ADHERE TO SAFETY PRECAUTIONS W/ASSISTANCE/DEVICE Description STG Adhere to Safety Precautions With Assistance/Device. Mod  Outcome: Progressing   Problem: Consults Goal: Skin Care Protocol Initiated - if Braden Score 18 or less Description If consults are not indicated, leave blank or document N/A Outcome: Progressing

## 2018-05-04 NOTE — Progress Notes (Signed)
[place] PHYSICAL MEDICINE & REHABILITATION PROGRESS NOTE  Subjective/Complaints: Patient sitting up in chair.  She has no complaints.  She feels well.  Objective: Vital Signs: Blood pressure (!) 143/78, pulse 63, temperature 98.4 F (36.9 C), resp. rate 12, height 5\' 6"  (1.676 m), weight 101.5 kg, SpO2 94 %. Physical Exam: BP (!) 143/78 (BP Location: Left Arm)   Pulse 63   Temp 98.4 F (36.9 C)   Resp 12   Ht 5\' 6"  (1.676 m)   Wt 101.5 kg   SpO2 94%   BMI 36.12 kg/m    Pleasant female in no acute distress.  HEENT exam atraumatic, normocephalic.  Extraocular muscles are intact.  Neck is supple. Chest clear to auscultation Cardiac exam S1 and S2 are regular Extremities without edema. Assessment/Plan: 1. 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 DVT Prophylaxis/Anticoagulation: Pharmaceutical:Lovenox 3.Chronic back pain/Pain Management:Tylenol prn 4. Mood:LCSW to follow for evaluation and support. 5. Neuropsych: This patientiscapable of making decisions on herown behalf. 6. Skin/Wound Care:Routine pressure relief measures. Monitor wound for healing. 7. Fluids/Electrolytes/Nutrition:Monitor I/O.   D1 thins, advance diet as tolerated 8. T2DM:Hgb A1c- 6.5.Monitor BS ac/hs.  CBG (last 3)  Recent Labs    05/03/18 1648 05/03/18 2113 05/04/18 0646  GLUCAP 185* 201* 97  Variable control.  Will follow blood sugars.  May need to cover with sliding scale.  9. Acute on chronic renal disease:  Lab Results  Component Value Date   CREATININE 3.79 (H) 05/04/2018    10. Occult spina bifida with neuropathy: Managed with OTC supplements. 11. Leucocytosis: minimal, no concerns CBC:    Component Value Date/Time   WBC 8.2 04/30/2018 0726   HGB 11.8 (L) 04/30/2018 0726   HCT 37.5 04/30/2018 0726    PLT 289 04/30/2018 0726   MCV 93.5 04/30/2018 0726   NEUTROABS 6.0 04/30/2018 0726   LYMPHSABS 1.4 04/30/2018 0726   MONOABS 0.7 04/30/2018 0726   EOSABS 0.1 04/30/2018 0726   BASOSABS 0.0 04/30/2018 0726     12. Acute blood loss anemia:  Will follow 13. Vestibular dysfunction: N/V with dizzinessimproving. 14. HTN: We will follow.  Blood pressure 117-143/62-78. 15. Hypoxia: Currently off oxygen. 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 17.  Hypoalbuminemia  Supplement initiated on 1/11 18.  UTI  ecoli on cephalixin 19.  Oral blister  Oral gel ordered 20.  Hypokalemia- resolved   Lab Results  Component Value Date   K 5.1 05/04/2018      LOS: 15 days A FACE TO FACE EVALUATION WAS PERFORMED   H  05/04/2018, 10:17 AM

## 2018-05-05 ENCOUNTER — Inpatient Hospital Stay (HOSPITAL_COMMUNITY): Payer: Medicare Other

## 2018-05-05 LAB — GLUCOSE, CAPILLARY
GLUCOSE-CAPILLARY: 131 mg/dL — AB (ref 70–99)
Glucose-Capillary: 108 mg/dL — ABNORMAL HIGH (ref 70–99)
Glucose-Capillary: 132 mg/dL — ABNORMAL HIGH (ref 70–99)

## 2018-05-05 LAB — RENAL FUNCTION PANEL
Albumin: 2.9 g/dL — ABNORMAL LOW (ref 3.5–5.0)
Anion gap: 12 (ref 5–15)
BUN: 48 mg/dL — ABNORMAL HIGH (ref 8–23)
CO2: 33 mmol/L — ABNORMAL HIGH (ref 22–32)
Calcium: 9.6 mg/dL (ref 8.9–10.3)
Chloride: 91 mmol/L — ABNORMAL LOW (ref 98–111)
Creatinine, Ser: 3.89 mg/dL — ABNORMAL HIGH (ref 0.44–1.00)
GFR calc Af Amer: 13 mL/min — ABNORMAL LOW (ref >60)
GFR calc non Af Amer: 11 mL/min — ABNORMAL LOW (ref >60)
Glucose, Bld: 108 mg/dL — ABNORMAL HIGH (ref 70–99)
PHOSPHORUS: 5.5 mg/dL — AB (ref 2.5–4.6)
Potassium: 5 mmol/L (ref 3.5–5.1)
SODIUM: 136 mmol/L (ref 135–145)

## 2018-05-05 NOTE — Plan of Care (Signed)
  Problem: RH BOWEL ELIMINATION Goal: RH STG MANAGE BOWEL WITH ASSISTANCE Description STG Manage Bowel with Assistance. mod  Outcome: Progressing   Problem: RH SKIN INTEGRITY Goal: RH STG SKIN FREE OF INFECTION/BREAKDOWN Description Remain free of skin breakdown and infection while on rehab with mod assist  Outcome: Progressing   Problem: RH SAFETY Goal: RH STG ADHERE TO SAFETY PRECAUTIONS W/ASSISTANCE/DEVICE Description STG Adhere to Safety Precautions With Assistance/Device. Mod  Outcome: Progressing   Problem: Consults Goal: Skin Care Protocol Initiated - if Braden Score 18 or less Description If consults are not indicated, leave blank or document N/A Outcome: Progressing   Problem: RH BLADDER ELIMINATION Goal: RH STG MANAGE BLADDER WITH ASSISTANCE Description STG Manage Bladder With Assistance mod  Outcome: Not Progressing

## 2018-05-05 NOTE — Progress Notes (Signed)
[place] PHYSICAL MEDICINE & REHABILITATION PROGRESS NOTE  Subjective/Complaints: Patient sitting up in chair.  She has no complaints.  She feels well.  Objective: Physical Exam: BP 130/67 (BP Location: Left Arm)   Pulse 60   Temp 98.5 F (36.9 C) (Oral)   Resp 14   Ht 5\' 6"  (1.676 m)   Wt 101.5 kg   SpO2 91%   BMI 36.12 kg/m    Pleasant female in no acute distress.  HEENT exam atraumatic, normocephalic.  Extraocular muscles are intact.  She has erythema of the right eye.  No pain to palpation.  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  Neck is supple. Chest clear to auscultation Cardiac exam S1 and S2 are regular Extremities without edema. Assessment/Plan: 1. 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 DVT Prophylaxis/Anticoagulation: Pharmaceutical:Lovenox 3.Chronic back pain/Pain Management:Tylenol prn 4. Mood:LCSW to follow for evaluation and support. 5. Neuropsych: This patientiscapable of making decisions on herown behalf. 6. Skin/Wound Care:Routine pressure relief measures. Monitor wound for healing. 7. Fluids/Electrolytes/Nutrition:Monitor I/O.   D1 thins, advance diet as tolerated 8. T2DM:Hgb A1c- 6.5.Monitor BS ac/hs.  CBG (last 3)  Recent Labs    05/04/18 1629 05/04/18 2053 05/05/18 0647  GLUCAP 212* 155* 108*  Variable control.  Will follow blood sugars.  May need to cover with sliding scale.  9. Acute on chronic renal disease:  Lab Results  Component Value Date   CREATININE 3.89 (H) 05/05/2018    10. Occult spina bifida with neuropathy: Managed with OTC supplements. 11. Leucocytosis: minimal, no concerns CBC:    Component Value Date/Time   WBC 8.2 04/30/2018 0726   HGB 11.8 (L) 04/30/2018 0726   HCT 37.5 04/30/2018  0726   PLT 289 04/30/2018 0726   MCV 93.5 04/30/2018 0726   NEUTROABS 6.0 04/30/2018 0726   LYMPHSABS 1.4 04/30/2018 0726   MONOABS 0.7 04/30/2018 0726   EOSABS 0.1 04/30/2018 0726   BASOSABS 0.0 04/30/2018 0726     12. Acute blood loss anemia:  Will follow 13. Vestibular dysfunction: N/V with dizzinessimproving. 14. HTN: We will follow.  Blood pressure 117-143/62-78. 15. Hypoxia: Currently off oxygen. 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  We will continue erythromycin. 17.  Hypoalbuminemia  Supplement initiated on 1/11 18.  UTI  Completed therapy. 19.  Oral blister  Oral gel ordered 20.  Hypokalemia- resolved   Lab Results  Component Value Date   K 5.0 05/05/2018      LOS: 16 days A FACE TO FACE EVALUATION WAS PERFORMED   H  05/05/2018, 8:52 AM

## 2018-05-05 NOTE — Progress Notes (Signed)
Occupational Therapy Session Note  Patient Details  Name: Natalie Friedman MRN: [id number] Date of Birth: Apr 04, 1951  Today's Date: 05/05/2018 OT Individual Time: 1308-6578 OT Individual Time Calculation (min): 84 min    Short Term Goals: Week 2:  OT Short Term Goal 1 (Week 2): Pt will transfer to toilet wiht MIN A and LRAD OT Short Term Goal 2 (Week 2): Pt will advance pant past hips wiht S  OT Short Term Goal 3 (Week 2): Pt will complete 3/3 components of toileting wiht CGA OT Short Term Goal 4 (Week 2): Pt will wash 50% of body with RUE to demo improve control and strength in RUE   Skilled Therapeutic Interventions/Progress Updates:    Session focused on bathing and dressing tasks at shower level. No c/o pain during session, just nausea/dizziness. Pt used RW to complete functional mobility into bathroom with min A. Pt transferred to TTB with cueing required for UE placement. Pt washed UB/LB with close (S) seated. CGA during standing with anterior grab bar use. Pt sat EOB and donned shirt with (S). Min A to power up into standing to complete clothing management. Pt able to thread B LE through pants sitting with (S). Pt donned B socks with (S) EOB. Pt completed grooming tasks with R UE with set up. Pt politely declined leaving the room for any activity d/t nausea. Pt completed sit <> stand on non compliant surface in room with RW, cueing required for UE placement. Min A for standing balance. From seated level pt completed B [place] task at midline with a focus on visual fixation and tracking. Pt c/o diplopia and associated nausea. Trialed eye patch with little success. Pt attempted to use bathroom with no success, CGA provided for transfer and clothing management. Pt returned to supine in bed and was positioned comfortably when she abruptly began vomiting. RN Caryl Pina was called and entered to administer medication. Pt was assisted in changing clothes and sheets. Pt left supine with all needs met, RN still  present.   Therapy Documentation Precautions:  Precautions Precautions: Fall Precaution Comments: dizziness and nausea, h/o falls, double vision Other Brace: glasses with opaque occlusion on right lens of glasses Restrictions Weight Bearing Restrictions: No    Therapy/Group: Individual Therapy  Curtis Sites 05/05/2018, 7:16 AM

## 2018-05-06 ENCOUNTER — Inpatient Hospital Stay (HOSPITAL_COMMUNITY): Payer: Medicare Other | Admitting: Speech Pathology

## 2018-05-06 ENCOUNTER — Inpatient Hospital Stay (HOSPITAL_COMMUNITY): Payer: Medicare Other

## 2018-05-06 ENCOUNTER — Inpatient Hospital Stay (HOSPITAL_COMMUNITY): Payer: Medicare Other | Admitting: Occupational Therapy

## 2018-05-06 LAB — GLUCOSE, CAPILLARY
Glucose-Capillary: 162 mg/dL — ABNORMAL HIGH (ref 70–99)
Glucose-Capillary: 148 mg/dL — ABNORMAL HIGH (ref 70–99)
Glucose-Capillary: 166 mg/dL — ABNORMAL HIGH (ref 70–99)

## 2018-05-06 LAB — RENAL FUNCTION PANEL
Albumin: 2.8 g/dL — ABNORMAL LOW (ref 3.5–5.0)
Anion gap: 8 (ref 5–15)
BUN: 52 mg/dL — ABNORMAL HIGH (ref 8–23)
CO2: 36 mmol/L — AB (ref 22–32)
Calcium: 9.3 mg/dL (ref 8.9–10.3)
Chloride: 92 mmol/L — ABNORMAL LOW (ref 98–111)
Creatinine, Ser: 3.79 mg/dL — ABNORMAL HIGH (ref 0.44–1.00)
GFR calc Af Amer: 13 mL/min — ABNORMAL LOW (ref >60)
GFR calc non Af Amer: 11 mL/min — ABNORMAL LOW (ref >60)
Glucose, Bld: 95 mg/dL (ref 70–99)
POTASSIUM: 4.7 mmol/L (ref 3.5–5.1)
Phosphorus: 5.8 mg/dL — ABNORMAL HIGH (ref 2.5–4.6)
Sodium: 136 mmol/L (ref 135–145)

## 2018-05-06 MED ORDER — IRBESARTAN 300 MG PO TABS
150.0000 mg | ORAL_TABLET | Freq: Every day | ORAL | Status: DC
  Administered 2018-05-07 – 2018-05-09 (×3): 150 mg via ORAL
  Filled 2018-05-06 (×3): qty 1

## 2018-05-06 MED ORDER — FUROSEMIDE 40 MG PO TABS
40.0000 mg | ORAL_TABLET | Freq: Every day | ORAL | Status: DC
  Administered 2018-05-07 – 2018-05-09 (×3): 40 mg via ORAL
  Filled 2018-05-06 (×3): qty 1

## 2018-05-06 NOTE — Progress Notes (Signed)
Occupational Therapy Session Note  Patient Details  Name: Natalie Friedman MRN: [id number] Date of Birth: 1950/09/03  Today's Date: 05/06/2018 OT Individual Time: 1502-1530 OT Individual Time Calculation (min): 28 min    Short Term Goals: Week 3:  OT Short Term Goal 1 (Week 3): Continue working on established LTGs set at min supervision to min guard assist overall.  Skilled Therapeutic Interventions/Progress Updates:    Pt in bed to start session.  She was able to transfer to the EOB with supervision.  Once on the side of the bed, she was able to complete sit to stand with min guard assist and then ambulate to the bathroom for practice with toilet transfers with min assist.  She demonstrated LOB to the right during mobility X1.  Next, had pt ambulate back to the EOB where she worked on standing endurance and RUE coordination by coloring her picture, using the RUE.  She was able to stand for intervals of 40-50 seconds with min assist using the rolling table for slight support when coloring the picture.  She was able to complete 3 intervals.  Rest in between was needed secondary to nausea.  Finished session with pt back in bed to rest secondary to nausea with call button and phone in reach and bed alarm in place.    Therapy Documentation Precautions:  Precautions Precautions: Fall Precaution Comments: dizziness and nausea, h/o falls Other Brace: glasses with opaque occlusion on right lens of glasses Restrictions Weight Bearing Restrictions: No  Vital Signs: Therapy Vitals Temp: 98.1 F (36.7 C) Temp Source: Oral Pulse Rate: 67 Resp: 18 BP: (!) 153/78 Patient Position (if appropriate): Lying Oxygen Therapy SpO2: 93 % O2 Device: Room Air Pain: Pain Assessment Pain Scale: Faces Pain Score: 0-No pain   Therapy/Group: Individual Therapy  Kire Ferg OTR/L 05/06/2018, 4:54 PM

## 2018-05-06 NOTE — Progress Notes (Signed)
Speech Language Pathology Daily Session Note  Patient Details  Name: Natalie Friedman MRN: [id number] Date of Birth: [birth year]  Today's Date: 05/06/2018 SLP Individual Time: 1000-1045 SLP Individual Time Calculation (min): 45 min  Short Term Goals: Week 3: SLP Short Term Goal 1 (Week 3): Pt will demonstrate speech intelligibility strategies at sentence level with 90% intelligibility with Mod I assist. SLP Short Term Goal 1 - Progress (Week 3): Progressing toward goal SLP Short Term Goal 2 (Week 3): Pt will consume dys 2 and thin liquid via straw with min overt s/s aspiration and supervision verbal cues for swallow strategies.  SLP Short Term Goal 2 - Progress (Week 3): Progressing toward goal SLP Short Term Goal 3 (Week 3): Pt will complete semi-complex functional tasks with min assist for functional problem solving.   SLP Short Term Goal 4 (Week 3): Pt will use external aids to recall semi-complex daily information with supervision.   Skilled Therapeutic Interventions: Pt sitting in w/c; husband present for session.  Provided overview of nature of deficits - pt/husband interested in learning about the cranial nerves impacting speech/swallowing.  Pt initially communicating with low volume, limited spontaneity of verbal output, intelligibility approximately 80%.  She listed compensatory strategies to improve clarity independently; executed strategies during conversational speech with cues needed <10% of time.  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  Pt able to verbalize swallowing strategies independently.  Session ended early secondary to pt needing to use the bathroom, then vomiting and soiling her clothes; nursing present to assist.    Pain Pain Assessment Pain Scale: 0-10 Pain Score: 0-No pain  Therapy/Group: Individual Therapy  Juan Quam Laurice 05/06/2018, 11:41 AM

## 2018-05-06 NOTE — Progress Notes (Signed)
Physical Therapy Session Note  Patient Details  Name: Natalie Friedman MRN: [id number] Date of Birth: 18-Jan-1951  Today's Date: 05/06/2018 PT Individual Time: 1100-1145 PT Individual Time Calculation (min): 45 min   Short Term Goals: Week 2:  PT Short Term Goal 1 (Week 2): Pt will perform bed mobility with supervision assist consistently  PT Short Term Goal 1 - Progress (Week 2): Met PT Short Term Goal 2 (Week 2): Pt will perform bed<>WC transfers with Supervision assist  PT Short Term Goal 2 - Progress (Week 2): Progressing toward goal PT Short Term Goal 3 (Week 2): Pt will ambulate 36f with min assist from PT and LRAD  PT Short Term Goal 3 - Progress (Week 2): Met PT Short Term Goal 4 (Week 2): Pt will ascend 4 steps with BUE support and min assist from PT  PT Short Term Goal 4 - Progress (Week 2): Met Week 3:  PT Short Term Goal 1 (Week 3): STG=LTG due to ELOS   Skilled Therapeutic Interventions/Progress Updates:    Session focused on hands on family education with pt's husband, BAaron Edelmanwith focus on safety with mobility during simulated car transfer, basic transfer w/c <> bed, short distance gait with RW, bed mobility, and stair negotiation. BAaron Edelmanable to return demonstrate car and basic transfers and short distance gait with some cues about best body positioning and overall safety. He was able to provide good verbal cues for hand placement and technique. They plan to have ramp access for home entry but PT demonstrated safe stair negotiation and recommendation only with railing at min assist level in case they encounter this out in community. May benefit from further practice. Husband expressed potential interest in practicing real car transfer prior to d/c - will let [place] if they choose to set this up. Discussed home environment safety and risk for fall especially to the R and posteriorly. Denies questions or concerns at this time. Pt performed mobility at overall min assist level today  during session with cues as described above. Tendency for R lateral LOB x 2 during transfers with min assist to recover.   Therapy Documentation Precautions:  Precautions Precautions: Fall Precaution Comments: dizziness and nausea, h/o falls Other Brace: glasses with opaque occlusion on right lens of glasses Restrictions Weight Bearing Restrictions: No  Pain: Pain Assessment Pain Scale: 0-10 Pain Score: 0-No pain    Therapy/Group: Individual Therapy  GCanary BrimBIvory Broad PT, DPT, CBIS  05/06/2018, 12:20 PM

## 2018-05-06 NOTE — Progress Notes (Signed)
Occupational Therapy Weekly Progress Note  Patient Details  Name: Natalie Friedman MRN: [id number] Date of Birth: 1950-05-09  Beginning of progress report period: April 27, 2018 End of progress report period: May 06, 2018  Today's Date: 05/06/2018 OT Individual Time: 5465-0354 OT Individual Time Calculation (min): 57 min    Patient has met 2 of 4 short term goals.  Pt is making steady progress with OT currently.  She needs min assist for transfers into and out of the bathroom stand pivot with use of the RW.  She is able to complete all UB selfcare with supervision and LB selfcare with min assist sit to stand.  Mr. Krysiak continues to demonstrate RUE and RLE weakness but uses the RUE spontaneously during selfcare tasks with supervision and increased time.  Increased lean to the right is noted at times as well requiring mod assist to correct when she fatigues and is attempting turns to the left.  Feel overall she is on target for expected discharge 05/10/18 and family has participated in some education.  Will continue with current OT POC until discharge to work on on modified supervision to min guard assist goals.    Patient continues to demonstrate the following deficits: muscle weakness, decreased coordination and decreased motor planning, decreased motor planning and decreased sitting balance, decreased standing balance, decreased postural control, hemiplegia and decreased balance strategies and therefore will continue to benefit from skilled OT intervention to enhance overall performance with BADL and Reduce care partner burden.  Patient progressing toward long term goals..  Continue plan of care.  OT Short Term Goals Week 3:  Pt will continue working on established LTGs set at supervision to min guard assist.   Skilled Therapeutic Interventions/Progress Updates:    Pt's spouse in for family education on selfcare tasks.  Pt completed functional mobility to the shower bench with min assist  using the RW.  She was able to complete all UB bathing and dressing with supervision during session, with min assist for LB bathing and dressing.  She was able to complete grooming tasks at the sink in sitting with setup only.  Mod instructional cueing is still needed for hand positioning with sit to stand and stand to sit as well as cueing to back up further and square up to the surface she is attempting to sit on.  After ADL, took pt down to the ADL apartment for practice with walk-in shower transfers.  Spouse was able to assist pt with stepping over the edge of the shower backwards while holding RW.  She did attempt to sit too early before getting lined up with the seat.  Educated pt to step all the way back.  Overall mod assist was needed secondary to LOB to the right when attempting to step out.  Returned to the room at end of session with pt left up in the wheelchair in anticipation of next family education session with SLP.    Therapy Documentation Precautions:  Precautions Precautions: Fall Precaution Comments: dizziness and nausea, h/o falls Other Brace: glasses with opaque occlusion on right lens of glasses Restrictions Weight Bearing Restrictions: No  Pain: Pain Assessment Pain Scale: 0-10 Pain Score: 0-No pain ADL: See Care Tool Section for some details of ADL  Therapy/Group: Individual Therapy  Bonny Vanleeuwen OTR/L 05/06/2018, 12:07 PM

## 2018-05-06 NOTE — Plan of Care (Signed)
  Problem: RH BOWEL ELIMINATION Goal: RH STG MANAGE BOWEL WITH ASSISTANCE Description STG Manage Bowel with Assistance. mod  Outcome: Progressing   Problem: RH BLADDER ELIMINATION Goal: RH STG MANAGE BLADDER WITH ASSISTANCE Description STG Manage Bladder With Assistance mod  Outcome: Progressing   Problem: RH SKIN INTEGRITY Goal: RH STG SKIN FREE OF INFECTION/BREAKDOWN Description Remain free of skin breakdown and infection while on rehab with mod assist  Outcome: Progressing   Problem: RH SAFETY Goal: RH STG ADHERE TO SAFETY PRECAUTIONS W/ASSISTANCE/DEVICE Description STG Adhere to Safety Precautions With Assistance/Device. Mod  Outcome: Progressing   Problem: Consults Goal: Skin Care Protocol Initiated - if Braden Score 18 or less Description If consults are not indicated, leave blank or document N/A Outcome: Progressing

## 2018-05-06 NOTE — Progress Notes (Signed)
[place] PHYSICAL MEDICINE & REHABILITATION PROGRESS NOTE  Subjective/Complaints: Patient seen sitting up in a chair eating breakfast this morning.  States she slept well overnight.  States had a good weekend.  ROS: Denies CP, SOB, N/V/D  Objective: Vital Signs: Blood pressure (!) 142/68, pulse (!) 59, temperature 98.6 F (37 C), temperature source Oral, resp. rate 16, height 5\' 6"  (1.676 m), weight 101.5 kg, SpO2 92 %. No results found. No results for input(s): WBC, HGB, HCT, PLT in the last 72 hours. Recent Labs    05/05/18 0514 05/06/18 0549  NA 136 136  K 5.0 4.7  CL 91* 92*  CO2 33* 36*  GLUCOSE 108* 95  BUN 48* 52*  CREATININE 3.89* 3.79*  CALCIUM 9.6 9.3    Physical Exam: BP (!) 142/68 (BP Location: Right Arm)   Pulse (!) 59   Temp 98.6 F (37 C) (Oral)   Resp 16   Ht 5\' 6"  (1.676 m)   Wt 101.5 kg   SpO2 92%   BMI 36.12 kg/m  Constitutional: No distress . Vital signs reviewed. HENT: Normocephalic.  Atraumatic. Eyes: EOMI. No discharge. Cardiovascular: RRR.  No JVD. Respiratory: CTA laterally.  Normal effort. GI: BS +. Non-distended. Musc: No edema or tenderness in extremities. Neurological: Alert Facial weakness unchanged Dysarthria, unchanged Motor: 4/5 prox to distal throughout, left stronger than right, stable Skin: Skin iswarm. Psychiatric: Pleasant and cooperative.  Assessment/Plan: 1. 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  Physiatrist is providing close team supervision and 24 hour management of active medical problems listed below.  Physiatrist and rehab team continue to assess barriers to discharge/monitor patient progress toward functional and medical goals  Care Tool:  Bathing    Body parts bathed by patient: Right arm, Left arm, Chest, Abdomen, Front perineal area, Buttocks, Right upper leg, Left upper leg, Left  lower leg, Face, Right lower leg   Body parts bathed by helper: Front perineal area, Buttocks     Bathing assist Assist Level: Minimal Assistance - Patient > 75%     Upper Body Dressing/Undressing Upper body dressing   What is the patient wearing?: Pull over shirt    Upper body assist Assist Level: Set up assist    Lower Body Dressing/Undressing Lower body dressing      What is the patient wearing?: Pants, Incontinence brief     Lower body assist Assist for lower body dressing: Minimal Assistance - Patient > 75%     Toileting Toileting    Toileting assist Assist for toileting: Moderate Assistance - Patient 50 - 74%     Transfers Chair/bed transfer  Transfers assist     Chair/bed transfer assist level: Minimal Assistance - Patient > 75%     Locomotion Ambulation   Ambulation assist      Assist level: Moderate Assistance - Patient 50 - 74% Assistive device: Walker-rolling Max distance: 50   Walk 10 feet activity   Assist  Walk 10 feet activity did not occur: Safety/medical concerns  Assist level: Minimal Assistance - Patient > 75% Assistive device: Walker-rolling   Walk 50 feet activity   Assist Walk 50 feet with 2 turns activity did not occur: Safety/medical concerns  Assist level: Moderate Assistance - Patient - 50 - 74% Assistive device: Walker-rolling    Walk 150 feet activity   Assist Walk 150 feet activity did not occur: Safety/medical concerns  Walk 10 feet on uneven surface  activity   Assist Walk 10 feet on uneven surfaces activity did not occur: Safety/medical concerns         Wheelchair     Assist Will patient use wheelchair at discharge?: Yes Type of Wheelchair: Manual    Wheelchair assist level: Supervision/Verbal cueing Max wheelchair distance: 173ft    Wheelchair 50 feet with 2 turns activity    Assist    Wheelchair 50 feet with 2 turns activity did not occur: Safety/medical  concerns   Assist Level: Supervision/Verbal cueing   Wheelchair 150 feet activity     Assist Wheelchair 150 feet activity did not occur: Safety/medical concerns   Assist Level: Supervision/Verbal cueing      Medical Problem List and Plan: 1.Functional deficits and truncal/limb ataxiasecondary to vestibular schwannoma s/p crani/resection  Continue CIR  2. DVT Prophylaxis/Anticoagulation: Pharmaceutical:Lovenox 3.Chronic back pain/Pain Management:Tylenol prn 4. Mood:LCSW to follow for evaluation and support. 5. Neuropsych: This patientiscapable of making decisions on herown behalf. 6. Skin/Wound Care:Routine pressure relief measures. Monitor wound for healing. 7. Fluids/Electrolytes/Nutrition:Monitor I/O.   D1 thins, advance diet as tolerated 8. T2DM:Hgb A1c- 6.5.Monitor BS ac/hs. Use SSI for elevated BS--is on very limited diet.  Amaryl 1 mg started on 1/15  Labile, but relatively controlled on 1/27 9. Acute on chronic renal disease: Baseline SCr- 2.4--->2.54.  Patient states she was told she would need dialysis at the age of 12, but has been managing with diet control.  Creatinine 3.79 on 1/27  Encourage fluids  Continue to monitor 10. Occult spina bifida with neuropathy: Managed with OTC supplements. 11. Leucocytosis: Monitor for signs of infection. 12. Acute blood loss anemia:  Hemoglobin 11.8 on 1/21  Plan to order labs later this week  Continue to monitor 13. Vestibular dysfunction: N/V with dizzinessimproving. 14. HTN: BPstill labile---Monitor BP bid. Continue Avapro and Norvasc.  Hydralizine 25 TID started on 1/12, DC'd on 1/13  Renal ultrasound relative unremarkable  Informed by nursing regarding patient taking "herbs" from home.  Patient not to take any alternative medication at present-discussed with patient.  Lasix added, decreased to 40 mg daily on 1/27  Irbesartan decreased to daily on 1/27  Carvedilol added by nephro  Labile on  1/27  Monitor with increased mobility 15. Hypoxia: Encourage IS.   Weaned oxygen to off as tolerated. 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 17.  Hypoalbuminemia  Supplement initiated on 1/11 18.  UTI  Urine culture with >100,000 E. coli  Completed Keflex course on 1/26 19.  Oral blister  Oral gel ordered 20.  Hypokalemia  4.7 on 1/27  Supplement DC'd on 1/23   LOS: 17 days A FACE TO FACE EVALUATION WAS PERFORMED  Ankit Lorie Phenix 05/06/2018, 9:14 AM

## 2018-05-07 ENCOUNTER — Inpatient Hospital Stay (HOSPITAL_COMMUNITY): Payer: Medicare Other | Admitting: Occupational Therapy

## 2018-05-07 ENCOUNTER — Inpatient Hospital Stay (HOSPITAL_COMMUNITY): Payer: Medicare Other

## 2018-05-07 ENCOUNTER — Inpatient Hospital Stay (HOSPITAL_COMMUNITY): Payer: Medicare Other | Admitting: Speech Pathology

## 2018-05-07 LAB — RENAL FUNCTION PANEL
Albumin: 2.8 g/dL — ABNORMAL LOW (ref 3.5–5.0)
Anion gap: 13 (ref 5–15)
BUN: 51 mg/dL — AB (ref 8–23)
CO2: 34 mmol/L — ABNORMAL HIGH (ref 22–32)
Calcium: 9.4 mg/dL (ref 8.9–10.3)
Chloride: 88 mmol/L — ABNORMAL LOW (ref 98–111)
Creatinine, Ser: 3.62 mg/dL — ABNORMAL HIGH (ref 0.44–1.00)
GFR calc Af Amer: 14 mL/min — ABNORMAL LOW (ref >60)
GFR calc non Af Amer: 12 mL/min — ABNORMAL LOW (ref >60)
Glucose, Bld: 103 mg/dL — ABNORMAL HIGH (ref 70–99)
Phosphorus: 5.2 mg/dL — ABNORMAL HIGH (ref 2.5–4.6)
Potassium: 4.4 mmol/L (ref 3.5–5.1)
Sodium: 135 mmol/L (ref 135–145)

## 2018-05-07 LAB — GLUCOSE, CAPILLARY
GLUCOSE-CAPILLARY: 170 mg/dL — AB (ref 70–99)
Glucose-Capillary: 105 mg/dL — ABNORMAL HIGH (ref 70–99)
Glucose-Capillary: 172 mg/dL — ABNORMAL HIGH (ref 70–99)
Glucose-Capillary: 160 mg/dL — ABNORMAL HIGH (ref 70–99)

## 2018-05-07 NOTE — Progress Notes (Signed)
Occupational Therapy Session Note  Patient Details  Name: Natalie Friedman MRN: [id number] Date of Birth: 09/22/1950  Today's Date: 05/07/2018 OT Individual Time: 1420-1500 OT Individual Time Calculation (min): 40 min    Short Term Goals: Week 3:  OT Short Term Goal 1 (Week 3): Continue working on established LTGs set at min supervision to min guard assist overall.  Skilled Therapeutic Interventions/Progress Updates:    Session at bed level d/t pt nausea. Discussed d/c planning and B UE strengthening/ coordination. Pt with no c/o pain but extremely nauseas. Pt completed B UE strengthening circuit with a 3 lb dowel, with a focus on functional reaching. Visual cues provided for technique. Discussed techniques to decrease nausea at home with more homeopathic remedies, per pt request. Preferred music played during session to increase pt mood. Pt was left supine with all needs met, bed alarm set, and husband present.   Therapy Documentation Precautions:  Precautions Precautions: Fall Precaution Comments: dizziness and nausea, h/o falls Other Brace:   Restrictions Weight Bearing Restrictions: No   Vital Signs: Therapy Vitals Temp: 98.3 F (36.8 C) Temp Source: Oral Pulse Rate: 61 Resp: 16 BP: 136/75 Patient Position (if appropriate): Sitting Oxygen Therapy SpO2: 98 % O2 Device: Room Air Pain: Pain Assessment Pain Scale: 0-10 Pain Score: 0-No pain  Therapy/Group: Individual Therapy  Sandra H Davis 05/07/2018, 5:10 PM  

## 2018-05-07 NOTE — Progress Notes (Signed)
[place] PHYSICAL MEDICINE & REHABILITATION PROGRESS NOTE  Subjective/Complaints: Patient seen laying in bed this morning.  She states she slept well overnight.  She states she felt nauseated this morning but feels that is passing.  ROS: + Improving nausea. Denies CP, SOB, diarrhea  Objective: Vital Signs: Blood pressure 139/63, pulse (!) 59, temperature 99 F (37.2 C), temperature source Oral, resp. rate 18, height 5\' 6"  (1.676 m), weight 99.7 kg, SpO2 93 %. No results found. No results for input(s): WBC, HGB, HCT, PLT in the last 72 hours. Recent Labs    05/06/18 0549 05/07/18 0534  NA 136 135  K 4.7 4.4  CL 92* 88*  CO2 36* 34*  GLUCOSE 95 103*  BUN 52* 51*  CREATININE 3.79* 3.62*  CALCIUM 9.3 9.4    Physical Exam: BP 139/63 (BP Location: Right Arm)   Pulse (!) 59   Temp 99 F (37.2 C) (Oral)   Resp 18   Ht 5\' 6"  (1.676 m)   Wt 99.7 kg   SpO2 93%   BMI 35.48 kg/m  Constitutional: No distress . Vital signs reviewed. HENT: Normocephalic.  Atraumatic. Eyes: EOMI. No discharge. Cardiovascular: RRR.  No JVD. Respiratory: CTA bilaterally.  Normal effort. GI: BS +. Non-distended. Musc: No edema or tenderness in extremities. Neurological: Alert Facial weakness unchanged Dysarthria, unchanged Motor: 4/5 prox to distal throughout, left stronger than right, unchanged Skin: Skin iswarm. Psychiatric: Pleasant and cooperative.  Assessment/Plan: 1. 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  Physiatrist is providing close team supervision and 24 hour management of active medical problems listed below.  Physiatrist and rehab team continue to assess barriers to discharge/monitor patient progress toward functional and medical goals  Care Tool:  Bathing    Body parts bathed by patient: Right arm, Left arm, Chest, Abdomen, Front perineal area,  Buttocks, Right upper leg, Left upper leg, Left lower leg, Face, Right lower leg   Body parts bathed by helper: Front perineal area, Buttocks     Bathing assist Assist Level: Minimal Assistance - Patient > 75%     Upper Body Dressing/Undressing Upper body dressing   What is the patient wearing?: Pull over shirt    Upper body assist Assist Level: Supervision/Verbal cueing    Lower Body Dressing/Undressing Lower body dressing      What is the patient wearing?: Pants     Lower body assist Assist for lower body dressing: Minimal Assistance - Patient > 75%     Toileting Toileting    Toileting assist Assist for toileting: Moderate Assistance - Patient 50 - 74%     Transfers Chair/bed transfer  Transfers assist     Chair/bed transfer assist level: Minimal Assistance - Patient > 75%     Locomotion Ambulation   Ambulation assist      Assist level: Minimal Assistance - Patient > 75% Assistive device: Walker-rolling Max distance: 10'   Walk 10 feet activity   Assist  Walk 10 feet activity did not occur: Safety/medical concerns  Assist level: Minimal Assistance - Patient > 75% Assistive device: Walker-rolling   Walk 50 feet activity   Assist Walk 50 feet with 2 turns activity did not occur: Safety/medical concerns  Assist level: Moderate Assistance - Patient - 50 - 74% Assistive device: Walker-rolling    Walk 150 feet activity   Assist Walk 150 feet activity did not occur: Safety/medical concerns  Walk 10 feet on uneven surface  activity   Assist Walk 10 feet on uneven surfaces activity did not occur: Safety/medical concerns         Wheelchair     Assist Will patient use wheelchair at discharge?: Yes Type of Wheelchair: Manual    Wheelchair assist level: Supervision/Verbal cueing Max wheelchair distance: 162ft    Wheelchair 50 feet with 2 turns activity    Assist    Wheelchair 50 feet with 2 turns activity did not  occur: Safety/medical concerns   Assist Level: Supervision/Verbal cueing   Wheelchair 150 feet activity     Assist Wheelchair 150 feet activity did not occur: Safety/medical concerns   Assist Level: Supervision/Verbal cueing      Medical Problem List and Plan: 1.Functional deficits and truncal/limb ataxiasecondary to vestibular schwannoma s/p crani/resection  Continue CIR  2. DVT Prophylaxis/Anticoagulation: Pharmaceutical:Lovenox 3.Chronic back pain/Pain Management:Tylenol prn 4. Mood:LCSW to follow for evaluation and support. 5. Neuropsych: This patientiscapable of making decisions on herown behalf. 6. Skin/Wound Care:Routine pressure relief measures. Monitor wound for healing. 7. Fluids/Electrolytes/Nutrition:Monitor I/O.   D1 thins, advance diet as tolerated 8. T2DM:Hgb A1c- 6.5.Monitor BS ac/hs. Use SSI for elevated BS--is on very limited diet.  Amaryl 1 mg started on 1/15  Labile on 1/28 9. Acute on chronic renal disease: Baseline SCr- 2.4--->2.54.  Patient states she was told she would need dialysis at the age of 21, but has been managing with diet control.  Creatinine 3.60 on 1/28  Encourage fluids  Continue to monitor 10. Occult spina bifida with neuropathy: Managed with OTC supplements. 11. Leucocytosis: Monitor for signs of infection. 12. Acute blood loss anemia:  Hemoglobin 11.8 on 1/21  Labs ordered for tomorrow  Continue to monitor 13. Vestibular dysfunction: N/V with dizzinessimproving. 14. HTN: BPstill labile---Monitor BP bid. Continue Avapro and Norvasc.  Hydralizine 25 TID started on 1/12, DC'd on 1/13  Renal ultrasound relative unremarkable  Informed by nursing regarding patient taking "herbs" from home.  Patient not to take any alternative medication at present-discussed with patient.  Lasix added, decreased to 40 mg daily on 1/27  Irbesartan decreased to daily on 1/27  Carvedilol added by nephro  Labile on 1/28  Monitor  with increased mobility 15. Hypoxia: Encourage IS.   Weaned oxygen to off as tolerated. 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 17.  Hypoalbuminemia  Supplement initiated on 1/11 18.  UTI  Urine culture with >100,000 E. coli  Completed Keflex course on 1/26 19.  Oral blister  Oral gel ordered 20.  Hypokalemia  4.4 on 1/20  Supplement DC'd on 1/23, will continue to monitor and consider restarting low-dose if potassium continues to trend down   LOS: 18 days A FACE TO FACE EVALUATION WAS PERFORMED  Ernie Kasler Lorie Phenix 05/07/2018, 8:48 AM

## 2018-05-07 NOTE — Progress Notes (Addendum)
Speech Language Pathology Daily Session Note  Patient Details  Name: Natalie Friedman MRN: [id number] Date of Birth: 12-23-1950  Today's Date: 05/07/2018 SLP Individual Time: 9179-1505 SLP Individual Time Calculation (min): 62 min  Short Term Goals: Week 3: SLP Short Term Goal 1 (Week 3): Pt will demonstrate speech intelligibility strategies at sentence level with 90% intelligibility with Mod I assist. SLP Short Term Goal 1 - Progress (Week 3): Progressing toward goal SLP Short Term Goal 2 (Week 3): Pt will consume dys 2 and thin liquid via straw with min overt s/s aspiration and supervision verbal cues for swallow strategies.  SLP Short Term Goal 2 - Progress (Week 3): Progressing toward goal SLP Short Term Goal 3 (Week 3): Pt will complete semi-complex functional tasks with min assist for functional problem solving.   SLP Short Term Goal 4 (Week 3): Pt will use external aids to recall semi-complex daily information with supervision.   Skilled Therapeutic Interventions: Pt was seen for skilled ST targeting dysphagia and cognition. Upon arrival pt reported nausea and recounted events of previous night where she felt very confused and couldn't remember who she was. Throughout our session pt was oriented X4, however she did display slightly increased confusion and anxiety regarding discharge in comparison to previous visits. Pt consumed upgraded dysphagia 3 texture solids and thin liquids with min assist to utilize swallow strategies. Min oral residue was subsequently cleared with liquid wash which was independently initiated by pt. SLP also targeted memory goals by teaching pt how to utilize a memory notebook while in the hospital and reviewed other external memory aid strategies for use at home. During a functional money counting task, pt required mod assist verbal cues to problem solve different basic counting tasks. Although problem solving continues to present challenges for pt, her thought  organization and rationale for how to approach the task was much improved compared to previous session. Pt was left in wheelchair with chair alarm set and all needs met. Recommend upgrade diet texture to dysphagia 3, continue thin liquids; RN made aware.      Pain Pain Assessment Pain Scale: 0-10 Pain Score: 0-No pain  Therapy/Group: Individual Therapy  Jettie Booze, Student SLP  Jettie Booze 05/07/2018, 12:54 PM

## 2018-05-07 NOTE — Progress Notes (Signed)
Occupational Therapy Session Note  Patient Details  Name: Natalie Friedman MRN: [id number] Date of Birth: October 13, 1950  Today's Date: 05/07/2018 OT Individual Time: 1103-1202 OT Individual Time Calculation (min): 59 min    Short Term Goals: Week 3:  OT Short Term Goal 1 (Week 3): Continue working on established LTGs set at min supervision to min guard assist overall.  Skilled Therapeutic Interventions/Progress Updates:    Pt nauseas when therapist entered the room.  She vomited X1 at start of session before engaging in any activity.  She reported relief after this with transition over to the sink via wheelchair with min assist to complete washing hands, face, brushing teeth, and combing hair.  She performed all grooming tasks at supervision level.  Next had pt work on wheelchair mobility to the therapy gym with min assist on one occasion and increased time.  Pt able to use the RUE consistently on the wheelchair rim to push as therapy band had been applied to increase grip.  She was able to then work on sit to stand and standing endurance in the gym at the high/low table, while engaged in RUE use to play checkers.  She needed min assist for sit to stand with standing endurance from 40 seconds to 1 minute 30 seconds before needing to sit.  Pt with decreased carryover of hand placement when attempting to sit and held onto the table with each transition, even though cued not to.  Pt with increased nausea from sit to stand transitions and standing.  Returned to the room via wheelchair and therapist assist.  With pt again vomiting.  Pt felt better after this and was left up in the chair with alarm belt in place and call button and phone in reach.    Therapy Documentation Precautions:  Precautions Precautions: Fall Precaution Comments: dizziness and nausea, h/o falls Other Brace: glasses with opaque occlusion on right lens of glasses Restrictions Weight Bearing Restrictions: No  Pain: Pain  Assessment Pain Scale: 0-10 Pain Score: 0-No pain ADL: See Care Tool Section for some details of ADL tasks  Therapy/Group: Individual Therapy  Aleighna Wojtas OTR/L 05/07/2018, 12:32 PM

## 2018-05-07 NOTE — Progress Notes (Signed)
Occupational Therapy Session Note  Patient Details  Name: Natalie Friedman MRN: [id number] Date of Birth: [birth year]  Today's Date: 05/07/2018 OT Individual Time: 1500-1531 OT Individual Time Calculation (min): 31 min    Short Term Goals: Week 3:  OT Short Term Goal 1 (Week 3): Continue working on established LTGs set at min supervision to min guard assist overall.  Skilled Therapeutic Interventions/Progress Updates:   Pt in bed at start of session secondary to nausea and not feeling well.  She agreed to participate in therapy so therapist modified session as to work EOB.  Had pt work on picking up playing cards one at a time, and sort them by suit using the RUE only.  Several times during task she attempted to integrate the LUE to help her out, so therapist had to hold it.  She was able to sort all cards with increased time and only dropping two of them to the floor.  Min instructional cueing however to straighten out the piles as well.  Finished session with transfer back to supine with supervision.  Bed alarm in place and pt's spouse present during session as well.     Therapy Documentation Precautions:  Precautions Precautions: Fall Precaution Comments: dizziness and nausea, h/o falls Other Brace:   Restrictions Weight Bearing Restrictions: No  Pain: Pain Assessment Pain Scale: 0-10 Pain Score: 0-No pain   Therapy/Group: Individual Therapy  [name] OTR/L 05/07/2018, 3:50 PM

## 2018-05-07 NOTE — Progress Notes (Signed)
Nutrition Follow-up  DOCUMENTATION CODES:   Obesity unspecified  INTERVENTION:   - Continue [employer] Standard 1.0 cal formula PO BID, each supplement provides 325 kcal and 16 grams of protein  - Family may bring in food from home/outside that is compliant with dysphagia diet consistency  NUTRITION DIAGNOSIS:   Increased nutrient needs related to chronic illness (CHF) as evidenced by estimated needs.  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 Post with right facial palsy and dysarthria.   Weight stable since admission, overall down 2 lbs.  Spoke with pt at bedside who reports ongoing N/V. Pt states that she vomited during therapy this morning. Pt shares that she is eating all of her meals and drinking [employer] oral nutrition supplements.  Pt states that she received meat on her meal tray yesterday evening. RD added vegetarian and vegan to pt's diet order in [place].  Discussed pt with RN who confirms vomiting episode this morning. RN reports pt is vomiting every few days. RN to discuss with PA.  Meal Completion: 100% x last 6 recorded meals  Medications reviewed and include: vitamin D-3, Colace, Lasix, Amaryl, SSI, [employer] BID, Miralax, vitamin B-12, zinc sulfate  Labs reviewed: BUN 51 (H), creatinine 3.62 (H), phosphorus 5.2 (H) CBG's: 105, 166, 148 x 24 hours  UOP: 3500 ml x  24 hours per I/O's flowsheet  Diet Order:   Diet Order            DIET DYS 2 Room service appropriate? Yes; Fluid consistency: Thin  Diet effective now              EDUCATION NEEDS:   Not appropriate for education at this time  Skin:  Skin Assessment: Skin Integrity Issues: Incisions: head R  Last BM:  1/28 (medium type 3)  Height:   Ht Readings from Last 1 Encounters:  04/19/18 5\' 6"  (1.676 m)    Weight:   Wt Readings from Last 1 Encounters:  05/06/18 99.7 kg    Ideal Body Weight:  59 kg  BMI:  Body mass index is 35.48 kg/m.  Estimated Nutritional Needs:   Kcal:  1850-2050  Protein:  90-105 grams  Fluid:  >/= 1.8 L/day    Gaynell Face, MS, RD, LDN Inpatient Clinical Dietitian Pager: [phone] Weekend/After Hours: [phone]

## 2018-05-08 ENCOUNTER — Inpatient Hospital Stay (HOSPITAL_COMMUNITY): Payer: Medicare Other | Admitting: Occupational Therapy

## 2018-05-08 ENCOUNTER — Inpatient Hospital Stay (HOSPITAL_COMMUNITY): Payer: Medicare Other | Admitting: Physical Therapy

## 2018-05-08 ENCOUNTER — Inpatient Hospital Stay (HOSPITAL_COMMUNITY): Payer: Medicare Other | Admitting: Speech Pathology

## 2018-05-08 DIAGNOSIS — R339 Retention of urine, unspecified: Secondary | ICD-10-CM

## 2018-05-08 LAB — CBC WITH DIFFERENTIAL/PLATELET
Abs Immature Granulocytes: 0.03 10*3/uL (ref 0.00–0.07)
Basophils Absolute: 0 10*3/uL (ref 0.0–0.1)
Basophils Relative: 1 %
Eosinophils Absolute: 0.1 10*3/uL (ref 0.0–0.5)
Eosinophils Relative: 1 %
HEMATOCRIT: 33.9 % — AB (ref 36.0–46.0)
Hemoglobin: 10.9 g/dL — ABNORMAL LOW (ref 12.0–15.0)
Immature Granulocytes: 1 %
Lymphocytes Relative: 26 %
Lymphs Abs: 1.6 10*3/uL (ref 0.7–4.0)
MCH: 29.9 pg (ref 26.0–34.0)
MCHC: 32.2 g/dL (ref 30.0–36.0)
MCV: 92.9 fL (ref 80.0–100.0)
MONO ABS: 0.6 10*3/uL (ref 0.1–1.0)
Monocytes Relative: 9 %
Neutro Abs: 3.9 10*3/uL (ref 1.7–7.7)
Neutrophils Relative %: 62 %
Platelets: 186 10*3/uL (ref 150–400)
RBC: 3.65 MIL/uL — ABNORMAL LOW (ref 3.87–5.11)
RDW: 11.4 % — ABNORMAL LOW (ref 11.5–15.5)
WBC: 6.2 10*3/uL (ref 4.0–10.5)
nRBC: 0 % (ref 0.0–0.2)

## 2018-05-08 LAB — GLUCOSE, CAPILLARY
Glucose-Capillary: 103 mg/dL — ABNORMAL HIGH (ref 70–99)
Glucose-Capillary: 208 mg/dL — ABNORMAL HIGH (ref 70–99)
Glucose-Capillary: 188 mg/dL — ABNORMAL HIGH (ref 70–99)
Glucose-Capillary: 148 mg/dL — ABNORMAL HIGH (ref 70–99)

## 2018-05-08 LAB — RENAL FUNCTION PANEL
Albumin: 2.7 g/dL — ABNORMAL LOW (ref 3.5–5.0)
Anion gap: 13 (ref 5–15)
BUN: 51 mg/dL — ABNORMAL HIGH (ref 8–23)
CO2: 34 mmol/L — ABNORMAL HIGH (ref 22–32)
Calcium: 9.6 mg/dL (ref 8.9–10.3)
Chloride: 89 mmol/L — ABNORMAL LOW (ref 98–111)
Creatinine, Ser: 3.73 mg/dL — ABNORMAL HIGH (ref 0.44–1.00)
GFR calc Af Amer: 14 mL/min — ABNORMAL LOW (ref >60)
GFR calc non Af Amer: 12 mL/min — ABNORMAL LOW (ref >60)
Glucose, Bld: 100 mg/dL — ABNORMAL HIGH (ref 70–99)
Phosphorus: 5.2 mg/dL — ABNORMAL HIGH (ref 2.5–4.6)
Potassium: 4.7 mmol/L (ref 3.5–5.1)
Sodium: 136 mmol/L (ref 135–145)

## 2018-05-08 MED ORDER — BETHANECHOL CHLORIDE 10 MG PO TABS
10.0000 mg | ORAL_TABLET | Freq: Three times a day (TID) | ORAL | Status: DC
  Administered 2018-05-08 – 2018-05-09 (×3): 10 mg via ORAL
  Filled 2018-05-08 (×4): qty 1

## 2018-05-08 NOTE — Progress Notes (Signed)
Speech Language Pathology Daily Session Note  Patient Details  Name: Natalie Friedman MRN: [id number] Date of Birth: 12-Nov-1950  Today's Date: 05/08/2018 SLP Individual Time: 6803-2122 SLP Individual Time Calculation (min): 60 min  Short Term Goals: Week 3: SLP Short Term Goal 1 (Week 3): Pt will demonstrate speech intelligibility strategies at sentence level with 90% intelligibility with Mod I assist. SLP Short Term Goal 1 - Progress (Week 3): Progressing toward goal SLP Short Term Goal 2 (Week 3): Pt will consume dys 2 and thin liquid via straw with min overt s/s aspiration and supervision verbal cues for swallow strategies.  SLP Short Term Goal 2 - Progress (Week 3): Progressing toward goal SLP Short Term Goal 3 (Week 3): Pt will complete semi-complex functional tasks with min assist for functional problem solving.   SLP Short Term Goal 4 (Week 3): Pt will use external aids to recall semi-complex daily information with supervision.   Skilled Therapeutic Interventions:  Skilled treatment session focused on cognition goals. SLP received pt sitting upright in wheelchair. She was sick on her stomach and was vomiting. Support provided by SLP and after pt finished vomiting she appeared to feel better. Pt able to recover and participate in ST session but would document that pt appeared slightly reduced during session. Pt able to demonstrate some anticipatory awareness given hypothetical safety situations within home. Education also provided on diet/food choices that are in accordance with current diet recommendations. Pt left upright in wheelchair, lap alarm on and all needs within reach. Pt was sipping on ginger ale. Nursing aware.      Pain Pain Assessment Pain Scale: 0-10 Pain Score: 0-No pain  Therapy/Group: Individual Therapy  Judie Hollick 05/08/2018, 11:47 AM

## 2018-05-08 NOTE — Progress Notes (Signed)
Occupational Therapy Session Note  Patient Details  Name: Natalie Friedman MRN: [id number] Date of Birth: 1950-07-12  Today's Date: 05/08/2018 OT Individual Time: 4315-4008 OT Individual Time Calculation (min): 54 min    Short Term Goals: Week 3:  OT Short Term Goal 1 (Week 3): Continue working on established LTGs set at min supervision to min guard assist overall.  Skilled Therapeutic Interventions/Progress Updates:    Pt began session with ambulation to the shower with use of the RW for support.  Min assist needed to maintain balance secondary to fall to the right when stepping up the incline to the bathroom as well as with right turn to the shower bench.  She completed all bathing with min guard assist sit to stand from the shower bench.  She then transferred to the wheelchair with min assist for dressing and grooming tasks at the sink.  Min guard for all LB dressing with supervision for UB.  She completed grooming tasks from wheelchair level with setup only.  Finished session with work on functional transfers bed to wheelchair and back with left turns as this is when pt losses her balance.  She exhibited frequent LOB to the right secondary to placement of the left foot being too far in front and slightly adducted.  Pt left in wheelchair at end of session with call button and phone in reach and alarm belt in place.    Therapy Documentation Precautions:  Precautions Precautions: Fall Precaution Comments: dizziness and nausea, h/o falls Other Brace:   Restrictions Weight Bearing Restrictions: No Pain: Pain Assessment Pain Score: 0-No pain ADL: See Care Tool section for some details of ADL  Therapy/Group: Individual Therapy  Bryston Colocho OTR/L 05/08/2018, 9:56 AM

## 2018-05-08 NOTE — Progress Notes (Signed)
Physical Therapy Session Note  Patient Details  Name: Natalie Friedman MRN: [id number] Date of Birth: Jan 02, 1951  Today's Date: 05/08/2018 PT Individual Time: 1300-1400 PT Individual Time Calculation (min): 60 min   Short Term Goals: Week 1:  PT Short Term Goal 1 (Week 1): Pt will increase bed mobility to c/g. PT Short Term Goal 1 - Progress (Week 1): Met PT Short Term Goal 2 (Week 1): Pt will increase transfers to min A.  PT Short Term Goal 2 - Progress (Week 1): Met PT Short Term Goal 3 (Week 1): Pt will ambulate with LRAD about 50 feet PT Short Term Goal 3 - Progress (Week 1): Progressing toward goal PT Short Term Goal 4 (Week 1): Pt will ascend/descend 4 stairs with B rails. PT Short Term Goal 4 - Progress (Week 1): Progressing toward goal PT Short Term Goal 5 (Week 1): Pt will propel w/c about 100 feet with S.  PT Short Term Goal 5 - Progress (Week 1): Met Week 2:  PT Short Term Goal 1 (Week 2): Pt will perform bed mobility with supervision assist consistently  PT Short Term Goal 1 - Progress (Week 2): Met PT Short Term Goal 2 (Week 2): Pt will perform bed<>WC transfers with Supervision assist  PT Short Term Goal 2 - Progress (Week 2): Progressing toward goal PT Short Term Goal 3 (Week 2): Pt will ambulate 62f with min assist from PT and LRAD  PT Short Term Goal 3 - Progress (Week 2): Met PT Short Term Goal 4 (Week 2): Pt will ascend 4 steps with BUE support and min assist from PT  PT Short Term Goal 4 - Progress (Week 2): Met Week 3:  PT Short Term Goal 1 (Week 3): STG=LTG due to ELOS   Skilled Therapeutic Interventions/Progress Updates:   Pt received in w/c and agreeable to tx. Pt propels w/c from room>therapy gym with supervision and verbal cuing from therapist for R attention to avoid hitting walls/objects on R side. Pt transfers sit<>stand with CGA and RW in front. Pt gait training with RW 40 ft + 40 ft with min assist and verbal cuing to look up and to keep RW at correct  distance from body during ambulation. Pt performs STS exercise 2 x 5 w/o use of BUE and min assist>CGA with reassurance from PT during exercise. Pt performs reaching/throwing activity from R and L sides with horseshoes for proper weight shift/balance correction training in standing with minimal use of BUE and min assist from PT to maintain balance. Pt ambulates 10 ft with RW and CGA and verbal cuing to look up and for foot placement when turning to sit down. Pt performs car transfer with min assist and stand-pivot in SUV-simulated vehicle with verbal cuing for hand placement during transfer for d/c preparation. PT recommends that pt's husband come to therapy tomorrow to practice car transfer and pt voices understanding. Pt left sitting up in w/c with chair alarm donned and all needs in reach.   Pt c/o some nausea and dizziness throughout session but subsides with rest break. No c/o pain during session.  Therapy Documentation Precautions:  Precautions Precautions: Fall Precaution Comments: dizziness and nausea, h/o falls Other Brace:   Restrictions Weight Bearing Restrictions: No   Therapy/Group: Individual Therapy  Marland Reine 05/08/2018, 3:42 PM

## 2018-05-08 NOTE — Progress Notes (Signed)
O2 sat recorded at 82%, patient aroused recked sats 85% placed on nasal canula 2 Liters, sat at 98% ( HR-53) denies discomfort or pain, call bell within reach, monitored prn

## 2018-05-08 NOTE — Plan of Care (Signed)
PT goals downgraded due to slow progress towards goals secondary to continued dizziness, nausea, poor midline orientation and anxiety. See care plan for details

## 2018-05-08 NOTE — Progress Notes (Signed)
Occupational Therapy Session Note  Patient Details  Name: Natalie Friedman MRN: [id number] Date of Birth: May 27, 1950  Today's Date: 05/08/2018 OT Individual Time: 7893-8101 OT Individual Time Calculation (min): 28 min    Short Term Goals: Week 3:  OT Short Term Goal 1 (Week 3): Continue working on established LTGs set at min supervision to min guard assist overall.  Skilled Therapeutic Interventions/Progress Updates:    During session had pt start by completing sit to stand transitions pushing off of the bed with both UEs with supervision.  Then had her complete short distance mobility to the wheelchair and turn to the left in order to work on maintaining balance and avoiding fall to the right.  Pt still needs min assist to keep from falling to the right during these transfers.  Pt completed several transfers to and from the bed and wheelchair at the same min assist level with increased LOB to the right secondary to her feet getting too close together and her not adjusting her weightshift to counter the increased right lean.  Finished session with sitting on the EOB and completing RUE therapy putty exercises with medium resistance putty.  She completed both gross digit flexion as well as tip to tip pinch with mod instructional cueing for technique.  At end of session pt transitioned to supine with supervision.  Call button and phone in reach with bed alarm in place.    Therapy Documentation Precautions:  Precautions Precautions: Fall Precaution Comments: dizziness and nausea, h/o falls Other Brace:   Restrictions Weight Bearing Restrictions: No  Pain: Pain Assessment Pain Scale: Faces Pain Score: 0-No pain  Therapy/Group: Individual Therapy  Neeta Storey OTR/L 05/08/2018, 3:48 PM

## 2018-05-08 NOTE — Progress Notes (Signed)
[place] PHYSICAL MEDICINE & REHABILITATION PROGRESS NOTE  Subjective/Complaints: Decreased oxygen sats reported this morning. Pt denied any distress before or at present. Still req I/o caths. Husband can do?  ROS: Patient denies fever, rash, sore throat, blurred vision, nausea, vomiting, diarrhea, cough, shortness of breath or chest pain, joint or back pain, headache, or mood change.   Objective: Vital Signs: Blood pressure 106/60, pulse (!) 53, temperature 98.7 F (37.1 C), temperature source Oral, resp. rate 18, height 5\' 6"  (1.676 m), weight 99.7 kg, SpO2 (!) 85 %. No results found. Recent Labs    05/08/18 0500  WBC 6.2  HGB 10.9*  HCT 33.9*  PLT 186   Recent Labs    05/07/18 0534 05/08/18 0500  NA 135 136  K 4.4 4.7  CL 88* 89*  CO2 34* 34*  GLUCOSE 103* 100*  BUN 51* 51*  CREATININE 3.62* 3.73*  CALCIUM 9.4 9.6    Physical Exam: BP 106/60 (BP Location: Right Arm)   Pulse (!) 53   Temp 98.7 F (37.1 C) (Oral)   Resp 18   Ht 5\' 6"  (1.676 m)   Wt 99.7 kg   SpO2 (!) 85%   BMI 35.48 kg/m  Constitutional: No distress . Vital signs reviewed. HEENT: EOMI, oral membranes moist Neck: supple Cardiovascular: RRR without murmur. No JVD    Respiratory: CTA Bilaterally without wheezes or rales. Normal effort . 95% RA GI: BS +, non-tender, non-distended  Musc: No edema or tenderness in extremities. Neurological: Alert Facial weakness unchanged.  Dysarthria, improved Motor: 4/5 prox to distal throughout, left stronger than right, unchanged Skin: Skin iswarm. Psychiatric: Pleasant and cooperative.  Assessment/Plan: 1. 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  Physiatrist is providing close team supervision and 24 hour management of active medical problems listed below.  Physiatrist and rehab team continue to assess barriers to  discharge/monitor patient progress toward functional and medical goals  Care Tool:  Bathing    Body parts bathed by patient: Right arm, Left arm, Chest, Abdomen, Front perineal area, Buttocks, Right upper leg, Left upper leg, Left lower leg, Face, Right lower leg   Body parts bathed by helper: Front perineal area, Buttocks     Bathing assist Assist Level: Minimal Assistance - Patient > 75%     Upper Body Dressing/Undressing Upper body dressing   What is the patient wearing?: Pull over shirt    Upper body assist Assist Level: Supervision/Verbal cueing    Lower Body Dressing/Undressing Lower body dressing      What is the patient wearing?: Pants     Lower body assist Assist for lower body dressing: Minimal Assistance - Patient > 75%     Toileting Toileting    Toileting assist Assist for toileting: Moderate Assistance - Patient 50 - 74%     Transfers Chair/bed transfer  Transfers assist     Chair/bed transfer assist level: Minimal Assistance - Patient > 75%     Locomotion Ambulation   Ambulation assist      Assist level: Minimal Assistance - Patient > 75% Assistive device: Walker-rolling Max distance: 10'   Walk 10 feet activity   Assist  Walk 10 feet activity did not occur: Safety/medical concerns  Assist level: Minimal Assistance - Patient > 75% Assistive device: Walker-rolling   Walk 50 feet activity   Assist Walk 50 feet with 2 turns activity did not occur: Safety/medical concerns  Assist  level: Moderate Assistance - Patient - 50 - 74% Assistive device: Walker-rolling    Walk 150 feet activity   Assist Walk 150 feet activity did not occur: Safety/medical concerns         Walk 10 feet on uneven surface  activity   Assist Walk 10 feet on uneven surfaces activity did not occur: Safety/medical concerns         Wheelchair     Assist Will patient use wheelchair at discharge?: Yes Type of Wheelchair: Manual    Wheelchair  assist level: Supervision/Verbal cueing Max wheelchair distance: 162ft    Wheelchair 50 feet with 2 turns activity    Assist    Wheelchair 50 feet with 2 turns activity did not occur: Safety/medical concerns   Assist Level: Supervision/Verbal cueing   Wheelchair 150 feet activity     Assist Wheelchair 150 feet activity did not occur: Safety/medical concerns   Assist Level: Supervision/Verbal cueing      Medical Problem List and Plan: 1.Functional deficits and truncal/limb ataxiasecondary to vestibular schwannoma s/p crani/resection  Continue CIR, team conf today. DC 1/31  -Patient to see Rehab MD/provider in the office for transitional care encounter in 1-2 weeks.  -family ed for I/O caths  2. DVT Prophylaxis/Anticoagulation: Pharmaceutical:Lovenox 3.Chronic back pain/Pain Management:Tylenol prn 4. Mood:LCSW to follow for evaluation and support. 5. Neuropsych: This patientiscapable of making decisions on herown behalf. 6. Skin/Wound Care:Routine pressure relief measures. Monitor wound for healing. 7. Fluids/Electrolytes/Nutrition:Monitor I/O.   D1 thins, advance diet as tolerated 8. T2DM:Hgb A1c- 6.5.Monitor BS ac/hs. Use SSI for elevated BS--is on very limited diet.  Amaryl 1 mg started on 1/15  Fair control. Observe cbg's at home---increase dose pending ongoing trends 9. Acute on chronic renal disease: Baseline SCr- 2.4--->2.54.  Patient states she was told she would need dialysis at the age of 89, but has been managing with diet control.  Creatinine 3.60 on 1/28  Encourage fluids  Continue to monitor 10. Occult spina bifida with neuropathy: Managed with OTC supplements. 11. Leucocytosis: Monitor for signs of infection. 12. Acute blood loss anemia:  Hemoglobin 10.9 today in line with prior readings (last was 11.8 and probably aberrant)     13. Vestibular dysfunction: N/V with dizzinessimproving. 14. HTN: BPstill labile---Monitor BP  bid. Continue Avapro and Norvasc.  Hydralizine 25 TID started on 1/12, DC'd on 1/13  Renal ultrasound relative unremarkable  Informed by nursing regarding patient taking "herbs" from home.  Patient not to take any alternative medication at present-discussed with patient.  Lasix added, decreased to 40 mg daily on 1/27  Irbesartan decreased to daily on 1/27  Carvedilol added by nephro  Labile on 1/28  Monitor with increased mobility 15. Hypoxia: Encourage IS.   Weaned oxygen to off as tolerated. 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 17.  Hypoalbuminemia  Supplement initiated on 1/11 18.  UTI/urine retention  Urine culture with >100,000 E. coli  Completed Keflex course on 1/26  -I/O caths as needed  -urecholine trial 19.  Oral blister  Oral gel ordered 20.  Hypokalemia  4.7 on 1/29  Supplement DC'd on 1/23, will continue to monitor and consider restarting low-dose if potassium continues to trend down   LOS: 19 days A FACE TO [place] 05/08/2018, 8:35 AM

## 2018-05-09 ENCOUNTER — Inpatient Hospital Stay (HOSPITAL_COMMUNITY): Payer: Medicare Other | Admitting: Occupational Therapy

## 2018-05-09 ENCOUNTER — Inpatient Hospital Stay (HOSPITAL_COMMUNITY): Payer: Medicare Other | Admitting: Speech Pathology

## 2018-05-09 ENCOUNTER — Inpatient Hospital Stay (HOSPITAL_COMMUNITY): Payer: Medicare Other | Admitting: Physical Therapy

## 2018-05-09 LAB — RENAL FUNCTION PANEL
Albumin: 2.8 g/dL — ABNORMAL LOW (ref 3.5–5.0)
Anion gap: 10 (ref 5–15)
BUN: 53 mg/dL — ABNORMAL HIGH (ref 8–23)
CO2: 35 mmol/L — ABNORMAL HIGH (ref 22–32)
Calcium: 9.5 mg/dL (ref 8.9–10.3)
Chloride: 91 mmol/L — ABNORMAL LOW (ref 98–111)
Creatinine, Ser: 4.06 mg/dL — ABNORMAL HIGH (ref 0.44–1.00)
GFR calc Af Amer: 12 mL/min — ABNORMAL LOW (ref >60)
GFR, EST NON AFRICAN AMERICAN: 11 mL/min — AB (ref >60)
Glucose, Bld: 99 mg/dL (ref 70–99)
Phosphorus: 5.3 mg/dL — ABNORMAL HIGH (ref 2.5–4.6)
Potassium: 4.7 mmol/L (ref 3.5–5.1)
Sodium: 136 mmol/L (ref 135–145)

## 2018-05-09 LAB — GLUCOSE, CAPILLARY
GLUCOSE-CAPILLARY: 97 mg/dL (ref 70–99)
Glucose-Capillary: 223 mg/dL — ABNORMAL HIGH (ref 70–99)
Glucose-Capillary: 176 mg/dL — ABNORMAL HIGH (ref 70–99)

## 2018-05-09 NOTE — Progress Notes (Signed)
[facility] PHYSICAL MEDICINE & REHABILITATION PROGRESS NOTE  Subjective/Complaints: Pt up in w/c unhappy that breakfast was wrong, and "I dont' know why I just can't go home today"  ROS: Patient denies fever, rash, sore throat, blurred vision, nausea, vomiting, diarrhea, cough, shortness of breath or chest pain, joint or back pain, headache,  Objective: Vital Signs: Blood pressure 125/64, pulse (!) 55, temperature 97.7 F (36.5 C), temperature source Oral, resp. rate 16, height 5\' 6"  (1.676 m), weight 99.7 kg, SpO2 96 %. No results found. Recent Labs    05/08/18 0500  WBC 6.2  HGB 10.9*  HCT 33.9*  PLT 186   Recent Labs    05/08/18 0500 05/09/18 0535  NA 136 136  K 4.7 4.7  CL 89* 91*  CO2 34* 35*  GLUCOSE 100* 99  BUN 51* 53*  CREATININE 3.73* 4.06*  CALCIUM 9.6 9.5    Physical Exam: BP 125/64 (BP Location: Left Arm)   Pulse (!) 55   Temp 97.7 F (36.5 C) (Oral)   Resp 16   Ht 5\' 6"  (1.676 m)   Wt 99.7 kg   SpO2 96%   BMI 35.48 kg/m  Constitutional: No distress . Vital signs reviewed. HEENT: EOMI, oral membranes moist Neck: supple Cardiovascular: RRR without murmur. No JVD    Respiratory: CTA Bilaterally without wheezes or rales. Normal effort    GI: BS +, non-tender, non-distended   Musc: No edema or tenderness in extremities. Neurological: Alert Facial weakness unchanged.  Dysarthria ongoing. Decreased peri-oral-facial sensation right face Motor: 4/5 prox to distal throughout, left stronger than right, unchanged Skin: Skin iswarm. Psychiatric: anxious and irritable.  Assessment/Plan: 1. 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  Physiatrist is providing close team supervision and 24 hour management of active medical problems listed below.  Physiatrist and rehab team continue to assess barriers to discharge/monitor patient progress  toward functional and medical goals  Care Tool:  Bathing    Body parts bathed by patient: Right arm, Left arm, Chest, Abdomen, Front perineal area, Buttocks, Right upper leg, Left upper leg, Left lower leg, Face, Right lower leg   Body parts bathed by helper: Front perineal area, Buttocks     Bathing assist Assist Level: Contact Guard/Touching assist     Upper Body Dressing/Undressing Upper body dressing   What is the patient wearing?: Pull over shirt    Upper body assist Assist Level: Set up assist    Lower Body Dressing/Undressing Lower body dressing      What is the patient wearing?: Pants     Lower body assist Assist for lower body dressing: Contact Guard/Touching assist     Toileting Toileting    Toileting assist Assist for toileting: Moderate Assistance - Patient 50 - 74%     Transfers Chair/bed transfer  Transfers assist     Chair/bed transfer assist level: Minimal Assistance - Patient > 75%     Locomotion Ambulation   Ambulation assist      Assist level: Minimal Assistance - Patient > 75% Assistive device: Walker-rolling Max distance: 40 ft   Walk 10 feet activity   Assist  Walk 10 feet activity did not occur: Safety/medical concerns  Assist level: Minimal Assistance - Patient > 75% Assistive device: Walker-rolling   Walk 50 feet activity   Assist Walk 50 feet with 2 turns activity did not occur: Safety/medical concerns  Assist level: Moderate Assistance - Patient -  50 - 74% Assistive device: Walker-rolling    Walk 150 feet activity   Assist Walk 150 feet activity did not occur: Safety/medical concerns         Walk 10 feet on uneven surface  activity   Assist Walk 10 feet on uneven surfaces activity did not occur: Safety/medical concerns         Wheelchair     Assist Will patient use wheelchair at discharge?: Yes Type of Wheelchair: Manual    Wheelchair assist level: Supervision/Verbal cueing Max  wheelchair distance: 150 ft    Wheelchair 50 feet with 2 turns activity    Assist    Wheelchair 50 feet with 2 turns activity did not occur: Safety/medical concerns   Assist Level: Supervision/Verbal cueing   Wheelchair 150 feet activity     Assist Wheelchair 150 feet activity did not occur: Safety/medical concerns   Assist Level: Supervision/Verbal cueing      Medical Problem List and Plan: 1.Functional deficits and truncal/limb ataxiasecondary to vestibular schwannoma s/p crani/resection  Continue CIR, team conf today. DC 1/31  -Patient to see Rehab MD/provider in the office for transitional care encounter in 1-2 weeks.  -family ed needed for I/O caths  2. DVT Prophylaxis/Anticoagulation: Pharmaceutical:Lovenox 3.Chronic back pain/Pain Management:Tylenol prn 4. Mood:LCSW to follow for evaluation and support. 5. Neuropsych: This patientiscapable of making decisions on herown behalf. 6. Skin/Wound Care:Routine pressure relief measures. Monitor wound for healing. 7. Fluids/Electrolytes/Nutrition:Monitor I/O.   D3 thins, advance diet as tolerated 8. T2DM:Hgb A1c- 6.5.Monitor BS ac/hs. Use SSI for elevated BS--is on very limited diet.  Amaryl 1 mg started on 1/15   Fair control. Observe cbg's at home---may need to increase dose once home 9. Acute on chronic renal disease: Baseline SCr- 2.4--->2.54.  Patient states she was told she would need dialysis at the age of 34, but has been managing with diet control.  Creatinine 3.60 on 1/28  Encourage fluids  Continue to monitor 10. Occult spina bifida with neuropathy: Managed with OTC supplements. 11. Leucocytosis: Monitor for signs of infection. 12. Acute blood loss anemia:  Hemoglobin 10.9 1/29(last was 11.8 and probably aberrant)     13. Vestibular dysfunction: N/V with dizzinessimproving. 14. HTN: BPstill labile---Monitor BP bid. Continue Avapro and Norvasc.  Hydralizine 25 TID started on  1/12, DC'd on 1/13  Renal ultrasound relative unremarkable  Informed by nursing regarding patient taking "herbs" from home.  Patient not to take any alternative medication at present-discussed with patient.  Lasix added, decreased to 40 mg daily on 1/27  Irbesartan decreased to daily on 1/27  Carvedilol added by nephro  Controlled 1/30  Monitor with increased mobility 15. Hypoxia: Encourage IS.   Weaned oxygen to off as tolerated. 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 17.  Hypoalbuminemia  Supplement initiated on 1/11 18.  UTI/urine retention  Urine culture with >100,000 E. coli  Completed Keflex course on 1/26  -I/O caths as needed, ed  -urecholine trial initiated 1/29 10mg  tid 19.  Oral blister  Oral gel ordered 20.  Hypokalemia  4.7 on 1/29  Supplement DC'd on 1/23, will continue to monitor and consider restarting low-dose if potassium continues to trend down   LOS: 20 days A FACE TO [place] 05/09/2018, 8:56 AM

## 2018-05-09 NOTE — Progress Notes (Signed)
Speech Language Pathology Discharge Summary  Patient Details  Name: Natalie Friedman MRN: [id number] Date of Birth: 16-May-1950  Today's Date: 05/09/2018 SLP Individual Time: 1116-1202 SLP Individual Time Calculation (min): 46 min   Skilled Therapeutic Interventions:  Pt was seen for skilled ST targeting dysphagia and cognition. Pt was found vomiting into emesis bag upon entrance to her room. Cold cloth, snacks, clean emesis bag, and emotional support provided. Despite nausea, pt ws agreeable to participate in final therapy session today. She consumed thin liquid and regular texture PO (graham cracker with peanut butter) with supervision verbal cues to recall and utilize strategies for safe swallow and efficient mastication. No overt s/s aspiration were observed. When readministered portions of the Cognistat, pt demonstrated significant improvements in many aspects of her cognitive function. Scores indicative of a mild impairment in memory and basic math calculations were still evident, however still improved since initial evaluation. Pt was left in wheelchair with chair alarm set, call bell within reach, and all questions answered/needs met. Recommend continue per current plan of care.     Patient has met 5 of 6 long term goals.  Patient to discharge at overall Supervision level.  Reasons goals not met: pt is min-mod A with complex problem solving   Clinical Impression/Discharge Summary:   Pt has made excellent functional gains while inpatient and is discharging having met 5 out of 6 long term goals. While pt is currently supervision for use and carryover of swallowing and speech intelligibility tasks, she requires min-mod assist during structured complex problem solving tasks due to impaired cognition. Pt is consuming dysphagia 3 (mechanical soft) diet and thin liquids with supervision assist for use of swallowing precautions.  Pt/family education is complete at this time.  Pt has demonstrated improved  mastication and oral containment of boluses as well as significantly increased speech intelligibility.  Pt is discharging home with recommendations for ST follow up at next level of care in order to maximize functional safety and independence at home.  Care Partner:  Caregiver Able to Provide Assistance: Yes  Type of Caregiver Assistance: Cognitive  Recommendation:  Home Health SLP;Outpatient SLP;24 hour supervision/assistance  Rationale for SLP Follow Up: Maximize cognitive function and independence;Reduce caregiver burden;Maximize swallowing safety;Maximize functional communication    Reasons for discharge: Discharged from hospital   Patient/Family Agrees with Progress Made and Goals Achieved: Yes   Jettie Booze, Student SLP   Jettie Booze 05/09/2018, 12:34 PM

## 2018-05-09 NOTE — Progress Notes (Signed)
Physical Therapy Discharge Summary  Patient Details  Name: Natalie Friedman MRN: [id number] Date of Birth: 1950/11/13  Today's Date: 05/09/2018 PT Individual Time: 1300-1400 PT Individual Time Calculation (min): 60 min    Patient has met 11 of 11 long term goals due to improved activity tolerance, improved balance, improved postural control, increased strength, increased range of motion, ability to compensate for deficits, functional use of  right upper extremity and right lower extremity, improved awareness and improved coordination.  Patient to discharge at an ambulatory level [place].   Patient's care partner is independent to provide the necessary physical and cognitive assistance at discharge.  Reasons goals not met: All PT goals met   Recommendation:  Patient will benefit from ongoing skilled PT services in home health setting to continue to advance safe functional mobility, address ongoing impairments in balance, strength, transfers, safety, gait, and minimize fall risk.  Equipment: WC.   Reasons for discharge: treatment goals met and discharge from hospital  Patient/family agrees with progress made and goals achieved: Yes   PT treatment:  Pt received sitting in WC and agreeable to PT. PT instructed pt in Grad day assessment to measure progress toward goals. See below for details. Car transfer with CGA from PT and min cues for set up and AD management. Pt returned to room and performed stand pivot transfer to bed with CGA. Sit>supine completed with supervision assist, and left supine in bed with call bell in reach and all needs met.     PT Discharge Precautions/Restrictions   fall Pain Pain Assessment Pain Scale: Faces Pain Score: 0-No pain Faces Pain Scale: Hurts a little bit Pain Type: Acute pain Pain Location: Mouth Pain Orientation: Right Pain Intervention(s): Emotional support Vision/Perception  Vision - Assessment Additional Comments: Pt demonstrates blurriness  in the right eye with near and far vision using her glasses.    Cognition Overall Cognitive Status: Impaired/Different from baseline Arousal/Alertness: Awake/alert Orientation Level: Oriented X4 Memory: Impaired Memory Impairment: Storage deficit;Decreased recall of new information Decreased Short Term Memory: Verbal basic;Verbal complex;Functional basic;Functional complex Awareness: Impaired Awareness Impairment: Anticipatory impairment Problem Solving: Impaired Problem Solving Impairment: Functional complex;Verbal complex Comments: Pt continues to require constant verbal cueing during each session for placement of UEs during sit to stand and stand to sit transitions.   Sensation Sensation Light Touch: Impaired Detail Light Touch Impaired Details: Impaired RUE;Impaired RLE(Pt reports difference in light touch at the forearm level, but intact in all other areas.) Hot/Cold: Appears Intact Proprioception: Appears Intact Stereognosis: Appears Intact Coordination Gross Motor Movements are Fluid and Coordinated: No Fine Motor Movements are Fluid and Coordinated: No Coordination and Movement Description: Pt with slight RUE coordination impairment compared to the LUE but uses functionally at a non-dominant level during ADL tasks.  Motor  Motor Motor: Ataxia;Hemiplegia Motor - Discharge Observations: mild R hemiplegia and ataxia   Mobility Transfers Transfers: Sit to Stand;Stand Pivot Transfers Sit to Stand: Contact Guard/Touching assist Stand to Sit: Contact Guard/Touching assist Stand Pivot Transfers: Minimal Assistance - Patient > 75% Transfer (Assistive device): Rolling walker Locomotion  Gait Ambulation: Yes Gait Assistance: Contact Guard/Touching assist Gait Distance (Feet): 50 Feet Assistive device: Rolling walker Gait Gait: Yes Gait Pattern: Impaired Gait Pattern: Decreased stance time - right;Decreased step length - left;Decreased step length - right;Wide base of  support;Right circumduction Stairs / Additional Locomotion Stairs: Yes Stairs Assistance: Contact Guard/Touching assist Stair Management Technique: Two rails Number of Stairs: 4 Height of Stairs: 6 Wheelchair Mobility Wheelchair Mobility: Yes Wheelchair Assistance:  Supervision/Verbal cueing Wheelchair Propulsion: Both upper extremities Wheelchair Parts Management: Supervision/cueing Distance: 136f  Trunk/Postural Assessment  Cervical Assessment Cervical Assessment: Exceptions to WFL(cervical protraction present) Thoracic Assessment Thoracic Assessment: Exceptions to WFL(Pt demonstrates thoracic rounding) Lumbar Assessment Lumbar Assessment: Exceptions to WFL(sits in posterior pelvic tilt) Postural Control Trunk Control: Increased lean and LOB to the right with functional mobility.  Balance Balance Balance Assessed: Yes Static Sitting Balance Static Sitting - Balance Support: Feet supported Static Sitting - Level of Assistance: 5: Stand by assistance Dynamic Sitting Balance Dynamic Sitting - Balance Support: During functional activity Dynamic Sitting - Level of Assistance: 5: Stand by assistance Static Standing Balance Static Standing - Balance Support: Bilateral upper extremity supported;During functional activity Static Standing - Level of Assistance: 5: Stand by assistance Dynamic Standing Balance Dynamic Standing - Balance Support: During functional activity Dynamic Standing - Level of Assistance: 4: Min assist Extremity Assessment  RUE Assessment RUE Assessment: Exceptions to [facility] LLCActive Range of Motion (AROM) Comments: WFLs General Strength Comments: Pt exhibits shoulder strength at 3+/5 with elbow and hand strength at 4/5.  Still with slight decreased speed of gross coordination compared to the left as well as slight FM impairement but much improved from eval.  Pt uses the RUE at a non-dominant level for selfcare tasks.   LUE Assessment LUE Assessment: Within  Functional Limits Active Range of Motion (AROM) Comments: WFL General Strength Comments: Strength 4/5 throughout RLE Assessment RLE Assessment: Exceptions to WAl[facility] SystemPassive Range of Motion (PROM) Comments: decreased extension R knee, hx TKA General Strength Comments: grossly 4/5 proximal to distal LLE Assessment LLE Assessment: Within Functional Limits General Strength Comments: 4+/5 proximal to distal     ALorie Phenix1/30/2020, 2:24 PM

## 2018-05-09 NOTE — Progress Notes (Signed)
Nurse assisted Chelsea, RN with removing suture to R posterior head. Ends were able to be removed, clear thick suture remains to midline incision with skin grown over top of incision site. Incision site is pink and swollen around site. Notified Pam, PA. Instructed that suture was dissolvable and to leave alone.   Erie Noe, LPN

## 2018-05-09 NOTE — Progress Notes (Signed)
Occupational Therapy Discharge Summary  Patient Details  Name: Natalie Friedman MRN: [id number] Date of Birth: [birth year]  Today's Date: 05/09/2018 OT Individual Time: 0900-1002 OT Individual Time Calculation (min): 62 min   Session Note:  Pt began session by working on self feeding with supervision using the RUE in dominant fashion.  Once this was complete, she transitioned over to the sink for bathing, dressing, and grooming tasks.  She completed all bathing sit to stand at the sink, per her choice as she showered yesterday, with overall supervision.  Min instructional cueing for hand placement with sit to stand and to not let go of the sink with both hands to pull up pants but to instead use one hand at a time.  Supervision for all dressing tasks with setup for grooming tasks of brushing her hair and completing oral hygiene.  Finished session by working on standing balance and functional transfers from wheelchair into the bathroom to the 3:1 with use of the RW.  Pt with LOB X 2 to the right during mobility resulting in the need for min assist.  Finished session with pt in the wheelchair with call button and phone in reach and safety alarm belt in place.     Patient has met 6 of 11 long term goals due to improved activity tolerance, improved balance, postural control, ability to compensate for deficits, functional use of  RIGHT upper extremity, improved awareness and improved coordination.  Patient to discharge at [facility] Assist level.  Patient's care partner is independent to provide the necessary physical and cognitive assistance at discharge.    Reasons goals not met: Pt needs min assist for dynamic standing balance, toilet transfers.  She needs mod assist for memory, and is only using the RUE currently at a non-dominant level with mild coordination deficits  Recommendation:  Patient will benefit from ongoing skilled OT services in home health setting to continue to advance functional skills in  the area of BADL and Reduce care partner burden.  Pt still demonstrates significant dynamic standing balance deficits during completion of transfers and selfcare tasks.  In addition, she demonstrates mild FM and gross motor coordination deficits as well as memory deficits to carry techniques from session to session.  Feel she will continue to progress with HHOT to a supervision and possibly modified independent level overall.    Equipment: No equipment provided  Reasons for discharge: treatment goals met and discharge from hospital  Patient/family agrees with progress made and goals achieved: Yes  OT Discharge Precautions/Restrictions  Precautions Precautions: Fall Precaution Comments: dizziness and nausea, h/o falls Restrictions Weight Bearing Restrictions: No  Vital Signs Therapy Vitals Pulse Rate: (!) 58 BP: 108/81 Patient Position (if appropriate): Sitting Oxygen Therapy SpO2: 98 % Pain Pain Assessment Pain Scale: 0-10 Pain Score: 0-No pain ADL ADL Eating: Supervision/safety Where Assessed-Eating: Wheelchair Grooming: Supervision/safety Where Assessed-Grooming: Wheelchair Upper Body Bathing: Setup Where Assessed-Upper Body Bathing: Wheelchair Lower Body Bathing: Contact guard Where Assessed-Lower Body Bathing: Wheelchair Upper Body Dressing: Setup Where Assessed-Upper Body Dressing: Wheelchair Lower Body Dressing: Contact guard Where Assessed-Lower Body Dressing: Wheelchair Toileting: Contact guard Where Assessed-Toileting: Medical laboratory scientific officer: Minimal assistance Armed forces technical officer Method: Magazine features editor: Minimal assistance Social research officer, government Method: Heritage manager: Shower seat without back Vision Baseline Vision/History: Wears glasses Wears Glasses: At all times Patient Visual Report: Nausea/blurring vision with head movement Vision Assessment?: Yes Eye Alignment: Within Functional Limits(right eye lid will  not close and white of eye is red with  inflammation) Ocular Range of Motion: Other (comment)(Limitations with superior movement bilaterally) Alignment/Gaze Preference: Within Defined Limits Tracking/Visual Pursuits: Decreased smoothness of vertical tracking;Decreased smoothness of horizontal tracking;Decreased smoothness of eye movement to RIGHT superior field;Decreased smoothness of eye movement to LEFT superior field Saccades: Additional eye shifts occurred during testing Convergence: Other (comment)(No convergence noted during testing) Visual Fields: No apparent deficits Perception  Perception: Impaired Comments: Increased lean to the right with transitional movements Praxis Praxis: Intact Cognition Overall Cognitive Status: Impaired/Different from baseline Arousal/Alertness: Awake/alert Orientation Level: Oriented X4 Memory: Impaired Memory Impairment: Storage deficit;Decreased recall of new information Decreased Short Term Memory: Verbal basic;Verbal complex;Functional basic;Functional complex Awareness: Impaired Awareness Impairment: Anticipatory impairment Problem Solving: Impaired Problem Solving Impairment: Functional complex;Verbal complex Safety/Judgment: Appears intact Comments: Pt continues to require constant verbal cueing during each session for placement of UEs during sit to stand and stand to sit transitions.   Sensation Sensation Light Touch: Impaired Detail Light Touch Impaired Details: Impaired RUE(Pt reports difference in light touch at the forearm level, but intact in all other areas.) Hot/Cold: Appears Intact Proprioception: Appears Intact Stereognosis: Appears Intact Coordination Gross Motor Movements are Fluid and Coordinated: No Fine Motor Movements are Fluid and Coordinated: No Coordination and Movement Description: Pt with slight RUE coordination impairment compared to the LUE but uses functionally at a non-dominant level during ADL tasks.  Motor   Motor Motor - Discharge Observations: mild right hemiparesis in the UE and LE Mobility  Transfers Sit to Stand: Contact Guard/Touching assist Stand to Sit: Contact Guard/Touching assist  Trunk/Postural Assessment  Cervical Assessment Cervical Assessment: Exceptions to WFL(cervical protraction present) Thoracic Assessment Thoracic Assessment: Exceptions to WFL(Pt demonstrates thoracic rounding) Lumbar Assessment Lumbar Assessment: Exceptions to WFL(sits in posterior pelvic tilt) Postural Control Trunk Control: Increased lean and LOB to the right with functional mobility.  Balance Balance Balance Assessed: Yes Static Sitting Balance Static Sitting - Balance Support: Feet supported Static Sitting - Level of Assistance: 5: Stand by assistance Dynamic Sitting Balance Dynamic Sitting - Balance Support: During functional activity Dynamic Sitting - Level of Assistance: 5: Stand by assistance Static Standing Balance Static Standing - Balance Support: Bilateral upper extremity supported;During functional activity Static Standing - Level of Assistance: 5: Stand by assistance Dynamic Standing Balance Dynamic Standing - Balance Support: During functional activity Dynamic Standing - Level of Assistance: 4: Min assist Extremity/Trunk Assessment RUE Assessment RUE Assessment: Exceptions to [facility] Active Range of Motion (AROM) Comments: WFLs General Strength Comments: Pt exhibits shoulder strength at 3+/5 with elbow and hand strength at 4/5.  Still with slight decreased speed of gross coordination compared to the left as well as slight FM impairement but much improved from eval.  Pt uses the RUE at a non-dominant level for selfcare tasks.   LUE Assessment LUE Assessment: Within Functional Limits Active Range of Motion (AROM) Comments: WFL General Strength Comments: Strength 4/5 throughout   Izabella Marcantel OTR/L 05/09/2018, 11:44 AM

## 2018-05-09 NOTE — Patient Care Conference (Signed)
Inpatient RehabilitationTeam Conference and Plan of Care Update Date: 05/08/2018   Time: 11:30 AM    Patient Name: Natalie Friedman      Medical Record Number: [id number]  Date of Birth: June 07, 1950 Sex: Female         Room/Bed: 4M06C/4M06C-01 Payor Info: Payor: [employer] park manager MEDICARE / Plan: [employer] MEDICARE / Product Type: *No Product type* /    Admitting Diagnosis: Tumor Rsxn  Admit Date/Time:  04/19/2018  2:34 PM Admission Comments: No comment available   Primary Diagnosis:  <principal problem not specified> Principal Problem: <principal problem not specified>  Patient Active Problem List   Diagnosis Date Noted  . E. coli UTI   . Hyperkalemia   . Labile blood pressure   . AKI (acute kidney injury) ([place])   . Labile blood glucose   . Hypertension   . Acute lower UTI   . Diabetes mellitus type 2 in obese ([place])   . Dysphagia   . CKD (chronic kidney disease)   . Hypokalemia   . Hypertensive crisis   . 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 Hypoalbuminemia due to protein-calorie malnutrition ([place])   . Essential hypertension   . Acute blood loss anemia   . Vestibular dysfunction of right ear   . Diabetes mellitus type 2 in nonobese ([facility])   . Foul smelling urine   . Blister   . Supplemental oxygen dependent   . Brain mass 04/19/2018  . 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 Brain tumor ([place]) 04/16/2018  . Cellulitis and abscess of right leg   . 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 Primary osteoarthritis of right knee   . Nephrotic syndrome 05/15/2016  . CHF (congestive heart failure) ([place]) 05/12/2016  . DM (diabetes mellitus), type 2 with renal complications ([place]) 62/83/1517  . Hypertensive urgency 05/12/2016  . anasarca 05/12/2016  . Immobility 05/12/2016  . Proteinuria 05/12/2016  . Uncontrolled hypertension 08/19/2015    Expected Discharge Date: Expected Discharge Date: 05/10/18  Team Members Present: Physician  leading conference: Dr. Alysia Penna Social Worker Present: Lennart Pall, LCSW Nurse Present: Dorien Chihuahua, RN PT Present: Barrie Folk, PT OT Present: Clyda Greener, OT SLP Present: Stormy Fabian, SLP PPS Coordinator present : Ileana Ladd, PT     Current Status/Progress Goal Weekly Team Focus  Medical   improving balance, right eye care ongoing. urine retention, just treated for UTI, sugars under better control  stabilize medically for dc  see above   Bowel/Bladder   continent with retenetion at  times requires I/O caths every 6 hours if unable to void continent bowel LBM 1/28  toilet with double voiding educate husband how to I/O cath patient due to retention and if no void w/i 8 hours  educate patient and husband on I/O caths and encourage double voiding    Swallow/Nutrition/ Hydration   dys 3, thin liquids, min assist for use of compensatory strategies (should still meet supervision level goal)  supervision  carryover of swallowing strategies   ADL's   Supervision for UB selfcare with min assist for LB selfcare.[place] assist for functional transfers using the RW.  Still with increased nausea during sessions.  Diplopia resolved.    min guard to supervision  selfcare retraining, balance retraining, transfer training, DME education, pt/family education   Mobility             Communication   supervision for speech intelligibility  supervision  carryover at the conversation  level   Safety/Cognition/ Behavioral Observations  min-mod assist for complex functional problem-solving  supervision  functionla problem solving and use of external memory aids   Pain   no c/o of pain  only nausea  remain pain free  monitor any episodes of pain and encouarge patient and family alternatives such as repositioning to decrease pain   Skin   back incision healing , MASD better to groin and periarea skin tear scabbed over.  educate on how to treat and manage skin breakdown and maintain skin  integrity out of hospital  educate patient and husband on skin care     Rehab Goals Patient on target to meet rehab goals: Yes *See Care Plan and progress notes for long and short-term goals.     Barriers to Discharge  Current Status/Progress Possible Resolutions Date Resolved   Physician    Medical stability        family ed for i/o caths, urecholine trial      Nursing                  PT                    OT                  SLP                SW                Discharge Planning/Teaching Needs:  Pt to d/c home with spouse who can provide 24/7 support.   Family education completed this week.   Team Discussion:  Monitor O2sats;  Still having some N/V.  See above for current functional levels.  Still with very poor endurance.  Falls to right with left turns.  Poor motor planning. On track to met supervision to CGA goals.  Family ed done.    Revisions to Treatment Plan:  NA    Continued Need for Acute Rehabilitation Level of Care: The patient requires daily medical management by a physician with specialized training in physical medicine and rehabilitation for the following conditions: Daily direction of a multidisciplinary physical rehabilitation program to ensure safe treatment while eliciting the highest outcome that is of practical value to the patient.: Yes Daily medical management of patient stability for increased activity during participation in an intensive rehabilitation regime.: Yes Daily analysis of laboratory values and/or radiology reports with any subsequent need for medication adjustment of medical intervention for : Post surgical problems;Neurological problems;Urological problems   I attest that I was present, lead the team conference, and concur with the assessment and plan of the team.   Jawon Dipiero 05/09/2018, 2:39 PM

## 2018-05-09 NOTE — Progress Notes (Signed)
Physical Therapy Session Note  Patient Details  Name: Natalie Friedman MRN: [id number] Date of Birth: [birth year]  Today's Date: 05/09/2018 PT Individual Time: 1015-1030 PT Individual Time Calculation (min): 15 min   Short Term Goals: Week 3:  PT Short Term Goal 1 (Week 3): STG=LTG due to ELOS   Skilled Therapeutic Interventions/Progress Updates:    Patient received in [facility], pleasant but reporting high levels of nausea/dizziness and willing to participate in modified session. Initially requested to stay in room but able to convince to go out of room to PT gym. Able to self-propel WC approximately 131f with S, then when in PT gym had wave of nausea/dizziness and refused to perform further tasks with therapy, requested to go back to her room. Self-propelled WC an additional 1525fwith S, once back in her room PT offered multiple options and activities that could be performed in her room however patient adamantly politely declined therapy due to onset of nausea/dizziness. Unable to convince her to participate in further therapy this session. [facility], all other needs met. 30 minutes of skilled PT time missed due to patient refusal.   Therapy Documentation Precautions:  Precautions Precautions: Fall Precaution Comments: dizziness and nausea, h/o falls Other Brace:   Restrictions Weight Bearing Restrictions: No General:   Pain: Pain Assessment Pain Scale: 0-10 Pain Score: 0-No pain    Therapy/Group: Individual Therapy  KrDeniece ReeT, DPT, CBIS  Supplemental Physical Therapist [facility]The [facility]  Pager [phone] [phone] 05/09/2018, 10:45 AM

## 2018-05-09 NOTE — Discharge Summary (Addendum)
Physician Discharge Summary  Patient ID: Baily Hovanec MRN: [id number] DOB/AGE: 09-02-1950 68 y.o.  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[place]) Active Problems:   Hypoalbuminemia due to protein-calorie malnutrition (HCC)   Essential hypertension   Acute blood loss anemia   Vestibular dysfunction of right ear   CKD (chronic kidney disease)   Diabetes mellitus type 2 in obese [facility])   Hypertension   Acute on chronic renal failure (HCC)   Suture reaction   Discharged Condition:  Stable   Significant Diagnostic Studies: N/A   Labs:  Basic Metabolic Panel: Recent Labs  Lab 05/04/18 0437 05/05/18 0514 05/06/18 0549 05/07/18 0534 05/08/18 0500 05/09/18 0535 05/10/18 0749  NA 135 136 136 135 136 136 135  K 5.1 5.0 4.7 4.4 4.7 4.7 4.8  CL 91* 91* 92* 88* 89* 91* 88*  CO2 34* 33* 36* 34* 34* 35* 35*  GLUCOSE 95 108* 95 103* 100* 99 103*  BUN 49* 48* 52* 51* 51* 53* 53*  CREATININE 3.79* 3.89* 3.79* 3.62* 3.73* 4.06* 4.01*  CALCIUM 9.3 9.6 9.3 9.4 9.6 9.5 9.8  PHOS 5.4* 5.5* 5.8* 5.2* 5.2* 5.3* 4.6    CBC: CBC Latest Ref Rng & Units 05/08/2018 04/30/2018 04/24/2018  WBC 4.0 - 10.5 K/uL 6.2 8.2 8.1  Hemoglobin 12.0 - 15.0 g/dL 10.9(L) 11.8(L) 10.1(L)  Hematocrit 36.0 - 46.0 % 33.9(L) 37.5 30.4(L)  Platelets 150 - 400 K/uL 186 289 203    CBG: Recent Labs  Lab 05/08/18 2115 05/09/18 0630 05/09/18 1207 05/09/18 1649 05/10/18 0616  GLUCAP 148* 97 223* 176* 114*    Brief HPI:   Pierina Schuknecht is a 68 year old female with history of OA, CKD, T2DM, 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  She was found to have right CP mass and was admitted on 04/16/2022 right rectosigmoid craniotomy with resection of mass by Dr. Venetia Constable.  Postop developed ight facial palsy, dysarthria as well as dizziness with nausea and  vomiting.  Blood pressures have been labile and she has had issues with hypoxia. Her diet was downgraded to dysphagia 1 due to oral phase dysphasia with sensorimotor deficits.  Therapy evaluations done reveling limitations in mobility and ability to carry out ADLs.  CIR was recommended due to functional deficits   Hospital Course: Lanny Donoso was admitted to rehab 04/19/2018 for inpatient therapies to consist of PT, ST and OT at least three hours five days a week. Past admission physiatrist, therapy team and rehab RN have worked together to provide customized collaborative inpatient rehab.  Blood pressures were monitored on a twice daily basis and were noted to be labile.  Serial check of labs also showed worsening of renal status therefore nephrology was consulted for input.  Coreg and Lasix were added per input and hydralazine was discontinued.  She was found to have issues with urinary retention and required intermittent in and out catheterization during the stay.  Urine culture done showed evidence of E. coli and she was treated with 1 week course of Keflex.  Low-dose Urecholine was ordered to help with bladder function and by, of discharge she is voiding without difficulty.  Follow-up CBC showed H&H is relatively stable. Hypoxia has resolved and she was weaned off oxygen.   Serial check of BMET showed worsening renal functions with tighter blood pressure control.  Lasix was decreased to once daily and  Avapro was decreased to once a day.  HHRN to recheck BMET next weeks with to Dr. Lowanda Foster.  Speech therapy has been working on dysphagia treatment and patient is showing improvement in ability to correct for pocketing.  Diet has been advanced to dysphagia 3 by discharge.  She continues to have issues with headaches which is controlled with as needed use of Tylenol.  Dizziness is improving.  She was noted to have irritation of right Crani suture line at discharge and was started on Keflex for treatment. Dr.  Venetia Constable contacted for input and recommended patient follow up next week for evaluation. Amaryl was added due to poorly controled BS and anticipate that BS should improve once able to adhere to her Vegan diet. Nutritional supplements were  offered with meals and patient advised not to take herbal supplements during her stay. She is to monitor BS bid basis and follow up with primary MD for further titration of Amaryl.  She has decrease in dizziness with improvement in activity tolerance and is at min assist level. She will continue to receive follow up [place], [place], HHRN and HHST by [facility] after discharge.     Rehab course: During patient's stay in rehab weekly team conferences were held to monitor patient's progress, set goals and discuss barriers to discharge. At admission, patient required mod assist with basic self-care task and mod assist for mobility. She exhibited slight delay in processing and exhibited dysphagia with mild to moderate residue requiring mod assist for oral care. She  has had improvement in activity tolerance, balance, postural control as well as ability to compensate for deficits. She has had improvement in functional use RUE and RLE as well as improvement in awareness.  She requires CGA for transfers and min assist to ambulate 75' with RW. She continues to exhibit increase in left lean and LOB with functional activity. She requires min assist for ADLs.  She requires min to mod assist for complex problems solving activities. She requires supervision for use of safe swallow strategies. Family education was completed regarding all aspects of care and mobility.     Disposition: Home  Diet: Diabetic--soft foods   Special Instructions: 1. Needs supervision with all activity. 2. Can apply warm compresses incision couple of times a day. Keep area clean and dry--contact surgeon if it starts draining or worsens.  3. Monitor blood sugars 2-3 times a day before meals.  4. HHRN to draw BMET  on 02/3 with results to Dr. Hinda Lenis and Dr. Nevada Crane.   Discharge Instructions    Ambulatory referral to Physical Medicine Rehab   Complete by:  As directed    1-2 weeks transitional care appt     Allergies as of 05/10/2018      Reactions   Corticosteroids Other (See Comments)   Hallucinations/swelling in legs & feet/passed out (ORAL ROUTE ONLY PER PATIENT)   Gluten Meal Other (See Comments)   Over time increases inflammation in body   Oxycodone Other (See Comments)   TOO STRONG, RESPIRATORY DEPRESSION      Medication List    STOP taking these medications   Alpha-Lipoic Acid 200 MG Tabs   ARNICA [place] PO   COQ10 150 MG Caps   docusate sodium 100 MG capsule Commonly known as:  COLACE   GLUCOSAMINE 1500 COMPLEX PO   heparin 5000 UNIT/ML injection   HYDROcodone-acetaminophen 5-325 MG tablet Commonly known as:  NORCO/VICODIN   torsemide 20 MG tablet Commonly known as:  DEMADEX   TRAUMEEL EX  TAKE these medications   acetaminophen 325 MG tablet Commonly known as:  TYLENOL Take 1-2 tablets (325-650 mg total) by mouth every 4 (four) hours as needed for mild pain.   amLODipine 5 MG tablet Commonly known as:  NORVASC Take 2 tablets (10 mg total) by mouth at bedtime. What changed:    how much to take  when to take this   bethanechol 10 MG tablet Commonly known as:  URECHOLINE Take 1 tablet (10 mg total) by mouth 3 (three) times daily.   carvedilol 25 MG tablet Commonly known as:  COREG Take 1 tablet (25 mg total) by mouth 2 (two) times daily with a meal.   cephALEXin 250 MG capsule Commonly known as:  KEFLEX Take 1 capsule (250 mg total) by mouth every 12 (twelve) hours.   furosemide 20 MG tablet Commonly known as:  LASIX Take 1 tablet (20 mg total) by mouth daily.   glimepiride 1 MG tablet Commonly known as:  AMARYL Take 1 tablet (1 mg total) by mouth daily with breakfast.   naphazoline-glycerin 0.012-0.2 % Soln Commonly known as:  CLEAR EYES  REDNESS Place 1 drop into the right eye 4 (four) times daily.   olmesartan 20 MG tablet Commonly known as:  BENICAR Take 1 tablet (20 mg total) by mouth daily. What changed:    medication strength  how much to take   vitamin B-12 500 MCG tablet Commonly known as:  CYANOCOBALAMIN Take 500 mcg by mouth daily.   Vitamin D 50 MCG (2000 UT) Caps Take 4,000 Units by mouth daily.   Zinc 30 MG Tabs Take 30 mg by mouth daily.      Follow-up Information    Jamse Arn, MD Follow up.   Specialty:  Physical Medicine and Rehabilitation Why:  office will call you with follow up appointment Contact information: [address] [place] [address] [phone]        Judith Part, MD. Call.   Specialty:  Neurosurgery Why:  for follow up appointment Contact information: [place] [address] [phone]        Fran Lowes, MD. Call.   Specialty:  Nephrology Why:  for follow up appointment on kidney disease Contact information: [address] [address] [place], John Z, MD Follow up on 05/17/2018.   Specialty:  Internal Medicine Why:  @ 10:20 am (hospital follow up appt) Contact information: [place] [facility] [address] [phone]           Signed: Bary Leriche 05/14/2018, 12:54 PM

## 2018-05-10 DIAGNOSIS — T8189XA Other complications of procedures, not elsewhere classified, initial encounter: Secondary | ICD-10-CM

## 2018-05-10 LAB — RENAL FUNCTION PANEL
ANION GAP: 12 (ref 5–15)
Albumin: 3 g/dL — ABNORMAL LOW (ref 3.5–5.0)
BUN: 53 mg/dL — ABNORMAL HIGH (ref 8–23)
CALCIUM: 9.8 mg/dL (ref 8.9–10.3)
CO2: 35 mmol/L — ABNORMAL HIGH (ref 22–32)
Chloride: 88 mmol/L — ABNORMAL LOW (ref 98–111)
Creatinine, Ser: 4.01 mg/dL — ABNORMAL HIGH (ref 0.44–1.00)
GFR calc Af Amer: 12 mL/min — ABNORMAL LOW (ref >60)
GFR calc non Af Amer: 11 mL/min — ABNORMAL LOW (ref >60)
Glucose, Bld: 103 mg/dL — ABNORMAL HIGH (ref 70–99)
Phosphorus: 4.6 mg/dL (ref 2.5–4.6)
Potassium: 4.8 mmol/L (ref 3.5–5.1)
SODIUM: 135 mmol/L (ref 135–145)

## 2018-05-10 LAB — GLUCOSE, CAPILLARY: Glucose-Capillary: 114 mg/dL — ABNORMAL HIGH (ref 70–99)

## 2018-05-10 MED ORDER — FUROSEMIDE 20 MG PO TABS: 20.0000 mg | ORAL_TABLET | Freq: Every day | ORAL | 0 refills | Status: DC

## 2018-05-10 MED ORDER — OLMESARTAN MEDOXOMIL 20 MG PO TABS: 20.0000 mg | ORAL_TABLET | Freq: Every day | ORAL | 1 refills | Status: AC

## 2018-05-10 MED ORDER — AMLODIPINE BESYLATE 5 MG PO TABS: 10.0000 mg | ORAL_TABLET | Freq: Every day | ORAL | 0 refills | Status: AC

## 2018-05-10 MED ORDER — FUROSEMIDE 20 MG PO TABS: 20.0000 mg | ORAL_TABLET | Freq: Every day | ORAL | Status: DC

## 2018-05-10 MED ORDER — CEPHALEXIN 250 MG PO CAPS: 250.0000 mg | ORAL_CAPSULE | Freq: Two times a day (BID) | ORAL | 0 refills | Status: DC

## 2018-05-10 MED ORDER — GLIMEPIRIDE 1 MG PO TABS: 1.0000 mg | ORAL_TABLET | Freq: Every day | ORAL | 0 refills | Status: AC

## 2018-05-10 MED ORDER — CEPHALEXIN 250 MG PO CAPS
250.0000 mg | ORAL_CAPSULE | Freq: Two times a day (BID) | ORAL | Status: DC
  Administered 2018-05-10: 250 mg via ORAL
  Filled 2018-05-10: qty 1

## 2018-05-10 MED ORDER — CARVEDILOL 25 MG PO TABS: 25.0000 mg | ORAL_TABLET | Freq: Two times a day (BID) | ORAL | 1 refills | Status: AC

## 2018-05-10 MED ORDER — POLYETHYLENE GLYCOL 3350 17 G PO PACK: 17.0000 g | PACK | Freq: Every day | ORAL | 0 refills | Status: DC

## 2018-05-10 MED ORDER — BETHANECHOL CHLORIDE 10 MG PO TABS: 10.0000 mg | ORAL_TABLET | Freq: Three times a day (TID) | ORAL | 0 refills | Status: DC

## 2018-05-10 NOTE — Progress Notes (Signed)
DC summary complete by Jeannene Patella, PA. Ordered keflex for UTI. 1st dose given. No c/o pain noted. Belongings packed and ready to DC. Husband at bedside. Will be transported to main lobby.   Erie Noe, LPN

## 2018-05-10 NOTE — Progress Notes (Signed)
05/09/2018 8p-7a shift  Patient refused all meds amd treatment, states she was told she didn't have to take meds or do anything because she was going home. Uncooperative behavior

## 2018-05-10 NOTE — Progress Notes (Signed)
Pt refused all meds this morning. Stated im going home, I will take medications when I get home. Trying encouraging important meds such as BP and lasix and urecholine, pt refused x 2. Husband at bedside and ready for DC. Pam, PA notified.   Erie Noe, LPN

## 2018-05-10 NOTE — Progress Notes (Signed)
[facility] PHYSICAL MEDICINE & REHABILITATION PROGRESS NOTE  Subjective/Complaints: Patient is in better spirits as she is going home today.  Still refused some medications this morning and and is irritable at times.  ROS: Patient denies fever, rash, sore throat, blurred vision,  vomiting, diarrhea, cough, shortness of breath or chest pain, joint or back pain, headache, or mood change.    Objective: Vital Signs: Blood pressure (!) 110/53, pulse (!) 55, temperature 97.8 F (36.6 C), temperature source Oral, resp. rate (!) 9, height 5\' 6"  (1.676 m), weight 99.7 kg, SpO2 95 %. No results found. Recent Labs    05/08/18 0500  WBC 6.2  HGB 10.9*  HCT 33.9*  PLT 186   Recent Labs    05/09/18 0535 05/10/18 0749  NA 136 135  K 4.7 4.8  CL 91* 88*  CO2 35* 35*  GLUCOSE 99 103*  BUN 53* 53*  CREATININE 4.06* 4.01*  CALCIUM 9.5 9.8    Physical Exam: BP (!) 110/53 (BP Location: Right Arm)   Pulse (!) 55   Temp 97.8 F (36.6 C) (Oral)   Resp (!) 9   Ht 5\' 6"  (1.676 m)   Wt 99.7 kg   SpO2 95%   BMI 35.48 kg/m  Constitutional: No distress . Vital signs reviewed. HEENT: EOMI, oral membranes moist Neck: supple Cardiovascular: RRR without murmur. No JVD    Respiratory: CTA Bilaterally without wheezes or rales. Normal effort    GI: BS +, non-tender, non-distended  Musc: No edema or tenderness in extremities. Neurological: Alert Facial weakness unchanged.  Dysarthria ongoing. Decreased peri-oral-facial sensation right face Motor: 4/5 prox to distal throughout, left stronger than right, unchanged Skin: Skin iswarm. Psychiatric: Generally pleasant and cooperative  Assessment/Plan: 1. 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  Physiatrist is providing close team supervision and 24 hour management of active medical problems listed below.  Physiatrist and  rehab team continue to assess barriers to discharge/monitor patient progress toward functional and medical goals  Care Tool:  Bathing    Body parts bathed by patient: Right arm, Left arm, Chest, Abdomen, Front perineal area, Buttocks, Right upper leg, Left upper leg, Left lower leg, Face, Right lower leg   Body parts bathed by helper: Front perineal area, Buttocks     Bathing assist Assist Level: Contact Guard/Touching assist     Upper Body Dressing/Undressing Upper body dressing   What is the patient wearing?: Pull over shirt    Upper body assist Assist Level: Set up assist    Lower Body Dressing/Undressing Lower body dressing      What is the patient wearing?: Pants     Lower body assist Assist for lower body dressing: Contact Guard/Touching assist     Toileting Toileting    Toileting assist Assist for toileting: Contact Guard/Touching assist     Transfers Chair/bed transfer  Transfers assist     Chair/bed transfer assist level: Contact Guard/Touching assist     Locomotion Ambulation   Ambulation assist      Assist level: Contact Guard/Touching assist Assistive device: Walker-rolling Max distance: 15ft   Walk 10 feet activity   Assist  Walk 10 feet activity did not occur: Safety/medical concerns  Assist level: Contact Guard/Touching assist Assistive device: Walker-rolling   Walk 50 feet activity   Assist Walk 50 feet with 2 turns activity did not occur: Safety/medical concerns  Assist level: Contact Guard/Touching assist Assistive device:  Walker-rolling    Walk 150 feet activity   Assist Walk 150 feet activity did not occur: Safety/medical concerns         Walk 10 feet on uneven surface  activity   Assist Walk 10 feet on uneven surfaces activity did not occur: Safety/medical concerns         Wheelchair     Assist Will patient use wheelchair at discharge?: Yes Type of Wheelchair: Manual    Wheelchair assist level:  Supervision/Verbal cueing Max wheelchair distance: 159ft    Wheelchair 50 feet with 2 turns activity    Assist    Wheelchair 50 feet with 2 turns activity did not occur: Safety/medical concerns   Assist Level: Supervision/Verbal cueing   Wheelchair 150 feet activity     Assist Wheelchair 150 feet activity did not occur: Safety/medical concerns   Assist Level: Supervision/Verbal cueing      Medical Problem List and Plan: 1.Functional deficits and truncal/limb ataxiasecondary to vestibular schwannoma s/p crani/resection     -Patient to see Rehab MD/provider in the office for transitional care encounter in 1-2 weeks.  -Discharge home today  -Recheck bmet next week given below see problems #9 and 14 2. DVT Prophylaxis/Anticoagulation: Pharmaceutical:Lovenox 3.Chronic back pain/Pain Management:Tylenol prn 4. Mood:LCSW to follow for evaluation and support. 5. Neuropsych: This patientiscapable of making decisions on herown behalf. 6. Skin/Wound Care:Routine pressure relief measures. Monitor wound for healing. 7. Fluids/Electrolytes/Nutrition:Monitor I/O.   D3 thins  8. T2DM:Hgb A1c- 6.5.Monitor BS ac/hs. Use SSI for elevated BS--is on very limited diet.  Amaryl 1 mg started on 1/15   Fair control as of 1/31. Observe cbg's at home---may need to increase dose once home 9. Acute on chronic renal disease: Baseline SCr- 2.4--->2.54.  Patient states she was told she would need dialysis at the age of 75, but has been managing with diet control.  Creatinine 4.01 today, 1/31  Encourage fluids  Decrease Lasix to 20 mg daily  -Follow-up with primary regarding kidney function and diuretic dose 10. Occult spina bifida with neuropathy: Managed with OTC supplements. 11. Leucocytosis: Monitor for signs of infection. 12. Acute blood loss anemia:  Hemoglobin 10.9 1/29(last was 11.8 and probably aberrant)     13. Vestibular dysfunction: N/V with  dizzinessimproving. 14. HTN: BPstill labile---Monitor BP bid. Continue Avapro and Norvasc.  Hydralizine 25 TID started on 1/12, DC'd on 1/13  Renal ultrasound relative unremarkable  Informed by nursing regarding patient taking "herbs" from home.  Patient not to take any alternative medication at present-discussed with patient.  Lasix added, decreased to 40 mg daily on 1/27--decrease further to 20 mg daily today given rising creatinine  Irbesartan decreased to daily on 1/27  Carvedilol added by nephro  Controlled 1/31  Monitor with increased mobility 15. Hypoxia: Encourage IS.   Weaned oxygen to off as tolerated. 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 17.  Hypoalbuminemia  Supplement initiated on 1/11 18.  UTI/urine retention  Urine culture with >100,000 E. coli  Completed Keflex course on 1/26   -urecholine trial initiated 1/29 10mg  tid with improved voiding, she is no longer needing in and out caths. 19.  Oral blister  Oral gel ordered 20.  Hypokalemia  4.8 on 1/31  Supplement DC'd on 1/23, will continue to monitor and consider restarting low-dose if potassium continues to trend down   LOS: 21 days A FACE TO [place] 05/10/2018, 9:11  AM

## 2018-05-10 NOTE — Progress Notes (Addendum)
Social Work Discharge Note  The overall goal for the admission was met for:   Discharge location: Yes - home with spouse who can provide 24//7 assistance.  Length of Stay: Yes - 21 days  Discharge activity level: Yes - minimal assist overall  Home/community participation: Yes  Services provided included: MD, RD, PT, OT, SLP, RN, Pharmacy and [place]: [employer] Medicare and Medicaid  Follow-up services arranged: Home Health: PT, OT, ST via [place], DME: 18x18 lightweight w/c, cushion via [place] and Patient/Family has no preference for HH/DME agencies  Comments (or additional information):   Contact #s: Home # [phone] Spouse, Pixie Burgener @ ([phone]   Patient/Family verbalized understanding of follow-up arrangements: Yes  Individual responsible for coordination of the follow-up plan: pt  Confirmed correct DME delivered: Lennart Pall 05/10/2018    Miria Cappelli

## 2018-05-10 NOTE — Discharge Instructions (Signed)
Inpatient Rehab Discharge Instructions  Natalie Friedman Discharge date and time:  05/10/18  Activities/Precautions/ Functional Status: Activity: no lifting, driving, or strenuous exercise till cleared by MD Diet:  Diabetic.  Soft foods.  Wound Care: keep wound clean and dry    Functional status:  ___ No restrictions     ___ Walk up steps independently _X__ 24/7 supervision/assistance   ___ Walk up steps with assistance ___ Intermittent supervision/assistance  ___ Bathe/dress independently ___ Walk with walker     _X__ Bathe/dress with assistance ___ Walk Independently    ___ Shower independently ___ Walk with assistance    ___ Shower with assistance _X__ No alcohol     ___ Return to work/school ________     COMMUNITY REFERRALS UPON DISCHARGE:    Home Health:   PT     OT     ST                         Agency:  [facility]    Phone: [phone]   Medical Equipment/Items Ordered: wheelchair, cushion                                                     Agency/Supplier:  [facility] @ [phone]   Special Instructions: 1. Monitor blood sugars twice a day and record. Take readings on follow up appointment with MD.      My questions have been answered and I understand these instructions. I will adhere to these goals and the provided educational materials after my discharge from the hospital.  Patient/Caregiver Signature _______________________________ Date __________  Clinician Signature _______________________________________ Date __________  Please bring this form and your medication list with you to all your follow-up doctor's appointments.

## 2018-05-12 DIAGNOSIS — Z96651 Presence of right artificial knee joint: Secondary | ICD-10-CM | POA: Diagnosis not present

## 2018-05-12 DIAGNOSIS — Q760 Spina bifida occulta: Secondary | ICD-10-CM | POA: Diagnosis not present

## 2018-05-12 DIAGNOSIS — Z5181 Encounter for therapeutic drug level monitoring: Secondary | ICD-10-CM | POA: Diagnosis not present

## 2018-05-12 DIAGNOSIS — Z48811 Encounter for surgical aftercare following surgery on the nervous system: Secondary | ICD-10-CM | POA: Diagnosis not present

## 2018-05-12 DIAGNOSIS — E1122 Type 2 diabetes mellitus with diabetic chronic kidney disease: Secondary | ICD-10-CM | POA: Diagnosis not present

## 2018-05-12 DIAGNOSIS — I509 Heart failure, unspecified: Secondary | ICD-10-CM | POA: Diagnosis not present

## 2018-05-12 DIAGNOSIS — E1140 Type 2 diabetes mellitus with diabetic neuropathy, unspecified: Secondary | ICD-10-CM | POA: Diagnosis not present

## 2018-05-12 DIAGNOSIS — D3610 Benign neoplasm of peripheral nerves and autonomic nervous system, unspecified: Secondary | ICD-10-CM | POA: Diagnosis not present

## 2018-05-12 DIAGNOSIS — M1991 Primary osteoarthritis, unspecified site: Secondary | ICD-10-CM | POA: Diagnosis not present

## 2018-05-12 DIAGNOSIS — N189 Chronic kidney disease, unspecified: Secondary | ICD-10-CM | POA: Diagnosis not present

## 2018-05-13 DIAGNOSIS — N189 Chronic kidney disease, unspecified: Secondary | ICD-10-CM | POA: Diagnosis not present

## 2018-05-13 DIAGNOSIS — D3610 Benign neoplasm of peripheral nerves and autonomic nervous system, unspecified: Secondary | ICD-10-CM | POA: Diagnosis not present

## 2018-05-13 DIAGNOSIS — Z96651 Presence of right artificial knee joint: Secondary | ICD-10-CM | POA: Diagnosis not present

## 2018-05-13 DIAGNOSIS — E1122 Type 2 diabetes mellitus with diabetic chronic kidney disease: Secondary | ICD-10-CM | POA: Diagnosis not present

## 2018-05-13 DIAGNOSIS — E1140 Type 2 diabetes mellitus with diabetic neuropathy, unspecified: Secondary | ICD-10-CM | POA: Diagnosis not present

## 2018-05-13 DIAGNOSIS — M1991 Primary osteoarthritis, unspecified site: Secondary | ICD-10-CM | POA: Diagnosis not present

## 2018-05-13 DIAGNOSIS — I509 Heart failure, unspecified: Secondary | ICD-10-CM | POA: Diagnosis not present

## 2018-05-13 DIAGNOSIS — Q760 Spina bifida occulta: Secondary | ICD-10-CM | POA: Diagnosis not present

## 2018-05-13 DIAGNOSIS — Z5181 Encounter for therapeutic drug level monitoring: Secondary | ICD-10-CM | POA: Diagnosis not present

## 2018-05-13 DIAGNOSIS — Z48811 Encounter for surgical aftercare following surgery on the nervous system: Secondary | ICD-10-CM | POA: Diagnosis not present

## 2018-05-14 ENCOUNTER — Telehealth: Payer: Self-pay

## 2018-05-14 DIAGNOSIS — N184 Chronic kidney disease, stage 4 (severe): Secondary | ICD-10-CM | POA: Diagnosis not present

## 2018-05-14 DIAGNOSIS — E782 Mixed hyperlipidemia: Secondary | ICD-10-CM | POA: Diagnosis not present

## 2018-05-14 DIAGNOSIS — R809 Proteinuria, unspecified: Secondary | ICD-10-CM | POA: Diagnosis not present

## 2018-05-14 DIAGNOSIS — E119 Type 2 diabetes mellitus without complications: Secondary | ICD-10-CM | POA: Diagnosis not present

## 2018-05-14 DIAGNOSIS — R600 Localized edema: Secondary | ICD-10-CM | POA: Diagnosis not present

## 2018-05-14 NOTE — Telephone Encounter (Signed)
I called the home phone and left a message for pt or spouse to call back and called spouse cell phone no answer and voicemail not set up.

## 2018-05-15 DIAGNOSIS — M1991 Primary osteoarthritis, unspecified site: Secondary | ICD-10-CM | POA: Diagnosis not present

## 2018-05-15 DIAGNOSIS — N189 Chronic kidney disease, unspecified: Secondary | ICD-10-CM | POA: Diagnosis not present

## 2018-05-15 DIAGNOSIS — Z96651 Presence of right artificial knee joint: Secondary | ICD-10-CM | POA: Diagnosis not present

## 2018-05-15 DIAGNOSIS — E1122 Type 2 diabetes mellitus with diabetic chronic kidney disease: Secondary | ICD-10-CM | POA: Diagnosis not present

## 2018-05-15 DIAGNOSIS — Z5181 Encounter for therapeutic drug level monitoring: Secondary | ICD-10-CM | POA: Diagnosis not present

## 2018-05-15 DIAGNOSIS — E1140 Type 2 diabetes mellitus with diabetic neuropathy, unspecified: Secondary | ICD-10-CM | POA: Diagnosis not present

## 2018-05-15 DIAGNOSIS — Z48811 Encounter for surgical aftercare following surgery on the nervous system: Secondary | ICD-10-CM | POA: Diagnosis not present

## 2018-05-15 DIAGNOSIS — I509 Heart failure, unspecified: Secondary | ICD-10-CM | POA: Diagnosis not present

## 2018-05-15 DIAGNOSIS — Q760 Spina bifida occulta: Secondary | ICD-10-CM | POA: Diagnosis not present

## 2018-05-15 DIAGNOSIS — D3610 Benign neoplasm of peripheral nerves and autonomic nervous system, unspecified: Secondary | ICD-10-CM | POA: Diagnosis not present

## 2018-05-16 DIAGNOSIS — Z5181 Encounter for therapeutic drug level monitoring: Secondary | ICD-10-CM | POA: Diagnosis not present

## 2018-05-16 DIAGNOSIS — I509 Heart failure, unspecified: Secondary | ICD-10-CM | POA: Diagnosis not present

## 2018-05-16 DIAGNOSIS — M1991 Primary osteoarthritis, unspecified site: Secondary | ICD-10-CM | POA: Diagnosis not present

## 2018-05-16 DIAGNOSIS — Z48811 Encounter for surgical aftercare following surgery on the nervous system: Secondary | ICD-10-CM | POA: Diagnosis not present

## 2018-05-16 DIAGNOSIS — Z96651 Presence of right artificial knee joint: Secondary | ICD-10-CM | POA: Diagnosis not present

## 2018-05-16 DIAGNOSIS — E1140 Type 2 diabetes mellitus with diabetic neuropathy, unspecified: Secondary | ICD-10-CM | POA: Diagnosis not present

## 2018-05-16 DIAGNOSIS — E1122 Type 2 diabetes mellitus with diabetic chronic kidney disease: Secondary | ICD-10-CM | POA: Diagnosis not present

## 2018-05-16 DIAGNOSIS — D3610 Benign neoplasm of peripheral nerves and autonomic nervous system, unspecified: Secondary | ICD-10-CM | POA: Diagnosis not present

## 2018-05-16 DIAGNOSIS — Q760 Spina bifida occulta: Secondary | ICD-10-CM | POA: Diagnosis not present

## 2018-05-16 DIAGNOSIS — N189 Chronic kidney disease, unspecified: Secondary | ICD-10-CM | POA: Diagnosis not present

## 2018-05-17 DIAGNOSIS — E1122 Type 2 diabetes mellitus with diabetic chronic kidney disease: Secondary | ICD-10-CM | POA: Diagnosis not present

## 2018-05-17 DIAGNOSIS — D333 Benign neoplasm of cranial nerves: Secondary | ICD-10-CM | POA: Diagnosis not present

## 2018-05-17 DIAGNOSIS — I959 Hypotension, unspecified: Secondary | ICD-10-CM | POA: Diagnosis not present

## 2018-05-17 DIAGNOSIS — N185 Chronic kidney disease, stage 5: Secondary | ICD-10-CM | POA: Diagnosis not present

## 2018-05-20 DIAGNOSIS — M1991 Primary osteoarthritis, unspecified site: Secondary | ICD-10-CM | POA: Diagnosis not present

## 2018-05-20 DIAGNOSIS — Z48811 Encounter for surgical aftercare following surgery on the nervous system: Secondary | ICD-10-CM | POA: Diagnosis not present

## 2018-05-20 DIAGNOSIS — E1122 Type 2 diabetes mellitus with diabetic chronic kidney disease: Secondary | ICD-10-CM | POA: Diagnosis not present

## 2018-05-20 DIAGNOSIS — I509 Heart failure, unspecified: Secondary | ICD-10-CM | POA: Diagnosis not present

## 2018-05-20 DIAGNOSIS — N189 Chronic kidney disease, unspecified: Secondary | ICD-10-CM | POA: Diagnosis not present

## 2018-05-20 DIAGNOSIS — Z5181 Encounter for therapeutic drug level monitoring: Secondary | ICD-10-CM | POA: Diagnosis not present

## 2018-05-20 DIAGNOSIS — Q760 Spina bifida occulta: Secondary | ICD-10-CM | POA: Diagnosis not present

## 2018-05-20 DIAGNOSIS — D3610 Benign neoplasm of peripheral nerves and autonomic nervous system, unspecified: Secondary | ICD-10-CM | POA: Diagnosis not present

## 2018-05-20 DIAGNOSIS — Z96651 Presence of right artificial knee joint: Secondary | ICD-10-CM | POA: Diagnosis not present

## 2018-05-20 DIAGNOSIS — E1140 Type 2 diabetes mellitus with diabetic neuropathy, unspecified: Secondary | ICD-10-CM | POA: Diagnosis not present

## 2018-05-21 DIAGNOSIS — Z5181 Encounter for therapeutic drug level monitoring: Secondary | ICD-10-CM | POA: Diagnosis not present

## 2018-05-21 DIAGNOSIS — E1122 Type 2 diabetes mellitus with diabetic chronic kidney disease: Secondary | ICD-10-CM | POA: Diagnosis not present

## 2018-05-21 DIAGNOSIS — I509 Heart failure, unspecified: Secondary | ICD-10-CM | POA: Diagnosis not present

## 2018-05-21 DIAGNOSIS — M1991 Primary osteoarthritis, unspecified site: Secondary | ICD-10-CM | POA: Diagnosis not present

## 2018-05-21 DIAGNOSIS — E1140 Type 2 diabetes mellitus with diabetic neuropathy, unspecified: Secondary | ICD-10-CM | POA: Diagnosis not present

## 2018-05-21 DIAGNOSIS — Q760 Spina bifida occulta: Secondary | ICD-10-CM | POA: Diagnosis not present

## 2018-05-21 DIAGNOSIS — D3610 Benign neoplasm of peripheral nerves and autonomic nervous system, unspecified: Secondary | ICD-10-CM | POA: Diagnosis not present

## 2018-05-21 DIAGNOSIS — Z48811 Encounter for surgical aftercare following surgery on the nervous system: Secondary | ICD-10-CM | POA: Diagnosis not present

## 2018-05-21 DIAGNOSIS — N189 Chronic kidney disease, unspecified: Secondary | ICD-10-CM | POA: Diagnosis not present

## 2018-05-21 DIAGNOSIS — Z96651 Presence of right artificial knee joint: Secondary | ICD-10-CM | POA: Diagnosis not present

## 2018-05-22 DIAGNOSIS — E1140 Type 2 diabetes mellitus with diabetic neuropathy, unspecified: Secondary | ICD-10-CM | POA: Diagnosis not present

## 2018-05-22 DIAGNOSIS — D3610 Benign neoplasm of peripheral nerves and autonomic nervous system, unspecified: Secondary | ICD-10-CM | POA: Diagnosis not present

## 2018-05-22 DIAGNOSIS — M1991 Primary osteoarthritis, unspecified site: Secondary | ICD-10-CM | POA: Diagnosis not present

## 2018-05-22 DIAGNOSIS — N189 Chronic kidney disease, unspecified: Secondary | ICD-10-CM | POA: Diagnosis not present

## 2018-05-22 DIAGNOSIS — Q760 Spina bifida occulta: Secondary | ICD-10-CM | POA: Diagnosis not present

## 2018-05-22 DIAGNOSIS — Z5181 Encounter for therapeutic drug level monitoring: Secondary | ICD-10-CM | POA: Diagnosis not present

## 2018-05-22 DIAGNOSIS — Z48811 Encounter for surgical aftercare following surgery on the nervous system: Secondary | ICD-10-CM | POA: Diagnosis not present

## 2018-05-22 DIAGNOSIS — E1122 Type 2 diabetes mellitus with diabetic chronic kidney disease: Secondary | ICD-10-CM | POA: Diagnosis not present

## 2018-05-22 DIAGNOSIS — Z96651 Presence of right artificial knee joint: Secondary | ICD-10-CM | POA: Diagnosis not present

## 2018-05-22 DIAGNOSIS — I509 Heart failure, unspecified: Secondary | ICD-10-CM | POA: Diagnosis not present

## 2018-05-23 ENCOUNTER — Encounter: Payer: Medicare Other | Admitting: Physical Medicine & Rehabilitation

## 2018-05-23 DIAGNOSIS — N189 Chronic kidney disease, unspecified: Secondary | ICD-10-CM | POA: Diagnosis not present

## 2018-05-23 DIAGNOSIS — D3610 Benign neoplasm of peripheral nerves and autonomic nervous system, unspecified: Secondary | ICD-10-CM | POA: Diagnosis not present

## 2018-05-23 DIAGNOSIS — E1122 Type 2 diabetes mellitus with diabetic chronic kidney disease: Secondary | ICD-10-CM | POA: Diagnosis not present

## 2018-05-23 DIAGNOSIS — Q760 Spina bifida occulta: Secondary | ICD-10-CM | POA: Diagnosis not present

## 2018-05-23 DIAGNOSIS — Z96651 Presence of right artificial knee joint: Secondary | ICD-10-CM | POA: Diagnosis not present

## 2018-05-23 DIAGNOSIS — E1140 Type 2 diabetes mellitus with diabetic neuropathy, unspecified: Secondary | ICD-10-CM | POA: Diagnosis not present

## 2018-05-23 DIAGNOSIS — M1991 Primary osteoarthritis, unspecified site: Secondary | ICD-10-CM | POA: Diagnosis not present

## 2018-05-23 DIAGNOSIS — Z48811 Encounter for surgical aftercare following surgery on the nervous system: Secondary | ICD-10-CM | POA: Diagnosis not present

## 2018-05-23 DIAGNOSIS — Z5181 Encounter for therapeutic drug level monitoring: Secondary | ICD-10-CM | POA: Diagnosis not present

## 2018-05-23 DIAGNOSIS — I509 Heart failure, unspecified: Secondary | ICD-10-CM | POA: Diagnosis not present

## 2018-05-27 DIAGNOSIS — E1122 Type 2 diabetes mellitus with diabetic chronic kidney disease: Secondary | ICD-10-CM | POA: Diagnosis not present

## 2018-05-27 DIAGNOSIS — N189 Chronic kidney disease, unspecified: Secondary | ICD-10-CM | POA: Diagnosis not present

## 2018-05-27 DIAGNOSIS — Z96651 Presence of right artificial knee joint: Secondary | ICD-10-CM | POA: Diagnosis not present

## 2018-05-27 DIAGNOSIS — I509 Heart failure, unspecified: Secondary | ICD-10-CM | POA: Diagnosis not present

## 2018-05-27 DIAGNOSIS — M1991 Primary osteoarthritis, unspecified site: Secondary | ICD-10-CM | POA: Diagnosis not present

## 2018-05-27 DIAGNOSIS — Z48811 Encounter for surgical aftercare following surgery on the nervous system: Secondary | ICD-10-CM | POA: Diagnosis not present

## 2018-05-27 DIAGNOSIS — Z5181 Encounter for therapeutic drug level monitoring: Secondary | ICD-10-CM | POA: Diagnosis not present

## 2018-05-27 DIAGNOSIS — E1140 Type 2 diabetes mellitus with diabetic neuropathy, unspecified: Secondary | ICD-10-CM | POA: Diagnosis not present

## 2018-05-27 DIAGNOSIS — D3610 Benign neoplasm of peripheral nerves and autonomic nervous system, unspecified: Secondary | ICD-10-CM | POA: Diagnosis not present

## 2018-05-27 DIAGNOSIS — Q760 Spina bifida occulta: Secondary | ICD-10-CM | POA: Diagnosis not present

## 2018-05-28 ENCOUNTER — Other Ambulatory Visit (HOSPITAL_COMMUNITY): Payer: Self-pay | Admitting: Neurological Surgery

## 2018-05-28 DIAGNOSIS — Z96651 Presence of right artificial knee joint: Secondary | ICD-10-CM | POA: Diagnosis not present

## 2018-05-28 DIAGNOSIS — I509 Heart failure, unspecified: Secondary | ICD-10-CM | POA: Diagnosis not present

## 2018-05-28 DIAGNOSIS — Z5181 Encounter for therapeutic drug level monitoring: Secondary | ICD-10-CM | POA: Diagnosis not present

## 2018-05-28 DIAGNOSIS — E1122 Type 2 diabetes mellitus with diabetic chronic kidney disease: Secondary | ICD-10-CM | POA: Diagnosis not present

## 2018-05-28 DIAGNOSIS — Q760 Spina bifida occulta: Secondary | ICD-10-CM | POA: Diagnosis not present

## 2018-05-28 DIAGNOSIS — Z48811 Encounter for surgical aftercare following surgery on the nervous system: Secondary | ICD-10-CM | POA: Diagnosis not present

## 2018-05-28 DIAGNOSIS — N189 Chronic kidney disease, unspecified: Secondary | ICD-10-CM | POA: Diagnosis not present

## 2018-05-28 DIAGNOSIS — D333 Benign neoplasm of cranial nerves: Secondary | ICD-10-CM

## 2018-05-28 DIAGNOSIS — M1991 Primary osteoarthritis, unspecified site: Secondary | ICD-10-CM | POA: Diagnosis not present

## 2018-05-28 DIAGNOSIS — E1140 Type 2 diabetes mellitus with diabetic neuropathy, unspecified: Secondary | ICD-10-CM | POA: Diagnosis not present

## 2018-05-28 DIAGNOSIS — D3610 Benign neoplasm of peripheral nerves and autonomic nervous system, unspecified: Secondary | ICD-10-CM | POA: Diagnosis not present

## 2018-05-29 ENCOUNTER — Other Ambulatory Visit (HOSPITAL_COMMUNITY): Payer: Self-pay | Admitting: Neurological Surgery

## 2018-05-29 DIAGNOSIS — E782 Mixed hyperlipidemia: Secondary | ICD-10-CM | POA: Diagnosis not present

## 2018-05-29 DIAGNOSIS — E119 Type 2 diabetes mellitus without complications: Secondary | ICD-10-CM | POA: Diagnosis not present

## 2018-05-29 DIAGNOSIS — N189 Chronic kidney disease, unspecified: Secondary | ICD-10-CM | POA: Diagnosis not present

## 2018-05-29 DIAGNOSIS — Z96651 Presence of right artificial knee joint: Secondary | ICD-10-CM | POA: Diagnosis not present

## 2018-05-29 DIAGNOSIS — Q760 Spina bifida occulta: Secondary | ICD-10-CM | POA: Diagnosis not present

## 2018-05-29 DIAGNOSIS — Z48811 Encounter for surgical aftercare following surgery on the nervous system: Secondary | ICD-10-CM | POA: Diagnosis not present

## 2018-05-29 DIAGNOSIS — R600 Localized edema: Secondary | ICD-10-CM | POA: Diagnosis not present

## 2018-05-29 DIAGNOSIS — R809 Proteinuria, unspecified: Secondary | ICD-10-CM | POA: Diagnosis not present

## 2018-05-29 DIAGNOSIS — D3610 Benign neoplasm of peripheral nerves and autonomic nervous system, unspecified: Secondary | ICD-10-CM | POA: Diagnosis not present

## 2018-05-29 DIAGNOSIS — I509 Heart failure, unspecified: Secondary | ICD-10-CM | POA: Diagnosis not present

## 2018-05-29 DIAGNOSIS — E1122 Type 2 diabetes mellitus with diabetic chronic kidney disease: Secondary | ICD-10-CM | POA: Diagnosis not present

## 2018-05-29 DIAGNOSIS — D333 Benign neoplasm of cranial nerves: Secondary | ICD-10-CM

## 2018-05-29 DIAGNOSIS — N184 Chronic kidney disease, stage 4 (severe): Secondary | ICD-10-CM | POA: Diagnosis not present

## 2018-05-29 DIAGNOSIS — Z5181 Encounter for therapeutic drug level monitoring: Secondary | ICD-10-CM | POA: Diagnosis not present

## 2018-05-29 DIAGNOSIS — M1991 Primary osteoarthritis, unspecified site: Secondary | ICD-10-CM | POA: Diagnosis not present

## 2018-05-29 DIAGNOSIS — E1140 Type 2 diabetes mellitus with diabetic neuropathy, unspecified: Secondary | ICD-10-CM | POA: Diagnosis not present

## 2018-05-30 DIAGNOSIS — E1140 Type 2 diabetes mellitus with diabetic neuropathy, unspecified: Secondary | ICD-10-CM | POA: Diagnosis not present

## 2018-05-30 DIAGNOSIS — I509 Heart failure, unspecified: Secondary | ICD-10-CM | POA: Diagnosis not present

## 2018-05-30 DIAGNOSIS — N189 Chronic kidney disease, unspecified: Secondary | ICD-10-CM | POA: Diagnosis not present

## 2018-05-30 DIAGNOSIS — Z48811 Encounter for surgical aftercare following surgery on the nervous system: Secondary | ICD-10-CM | POA: Diagnosis not present

## 2018-05-30 DIAGNOSIS — Q760 Spina bifida occulta: Secondary | ICD-10-CM | POA: Diagnosis not present

## 2018-05-30 DIAGNOSIS — Z5181 Encounter for therapeutic drug level monitoring: Secondary | ICD-10-CM | POA: Diagnosis not present

## 2018-05-30 DIAGNOSIS — M1991 Primary osteoarthritis, unspecified site: Secondary | ICD-10-CM | POA: Diagnosis not present

## 2018-05-30 DIAGNOSIS — E1122 Type 2 diabetes mellitus with diabetic chronic kidney disease: Secondary | ICD-10-CM | POA: Diagnosis not present

## 2018-05-30 DIAGNOSIS — D3610 Benign neoplasm of peripheral nerves and autonomic nervous system, unspecified: Secondary | ICD-10-CM | POA: Diagnosis not present

## 2018-05-30 DIAGNOSIS — Z96651 Presence of right artificial knee joint: Secondary | ICD-10-CM | POA: Diagnosis not present

## 2018-06-03 ENCOUNTER — Other Ambulatory Visit: Payer: Self-pay

## 2018-06-03 ENCOUNTER — Inpatient Hospital Stay (HOSPITAL_COMMUNITY): Payer: Medicare Other

## 2018-06-03 ENCOUNTER — Encounter (HOSPITAL_COMMUNITY): Payer: Self-pay | Admitting: Emergency Medicine

## 2018-06-03 ENCOUNTER — Inpatient Hospital Stay (HOSPITAL_COMMUNITY)
Admission: EM | Admit: 2018-06-03 | Discharge: 2018-06-15 | DRG: 032 | Disposition: A | Discharge status: Home Health | Payer: Medicare Other | Attending: Neurological Surgery | Admitting: Neurological Surgery

## 2018-06-03 DIAGNOSIS — I509 Heart failure, unspecified: Secondary | ICD-10-CM | POA: Diagnosis present

## 2018-06-03 DIAGNOSIS — Z91018 Allergy to other foods: Secondary | ICD-10-CM | POA: Diagnosis not present

## 2018-06-03 DIAGNOSIS — M1990 Unspecified osteoarthritis, unspecified site: Secondary | ICD-10-CM | POA: Diagnosis not present

## 2018-06-03 DIAGNOSIS — G910 Communicating hydrocephalus: Secondary | ICD-10-CM

## 2018-06-03 DIAGNOSIS — Z79899 Other long term (current) drug therapy: Secondary | ICD-10-CM | POA: Diagnosis not present

## 2018-06-03 DIAGNOSIS — E1142 Type 2 diabetes mellitus with diabetic polyneuropathy: Secondary | ICD-10-CM | POA: Diagnosis not present

## 2018-06-03 DIAGNOSIS — Z9889 Other specified postprocedural states: Secondary | ICD-10-CM | POA: Diagnosis not present

## 2018-06-03 DIAGNOSIS — G9600 Cerebrospinal fluid leak, unspecified: Secondary | ICD-10-CM

## 2018-06-03 DIAGNOSIS — N184 Chronic kidney disease, stage 4 (severe): Secondary | ICD-10-CM | POA: Diagnosis not present

## 2018-06-03 DIAGNOSIS — G9782 Other postprocedural complications and disorders of nervous system: Secondary | ICD-10-CM | POA: Diagnosis not present

## 2018-06-03 DIAGNOSIS — I130 Hypertensive heart and chronic kidney disease with heart failure and stage 1 through stage 4 chronic kidney disease, or unspecified chronic kidney disease: Secondary | ICD-10-CM | POA: Diagnosis not present

## 2018-06-03 DIAGNOSIS — G9619 Other disorders of meninges, not elsewhere classified: Secondary | ICD-10-CM | POA: Diagnosis not present

## 2018-06-03 DIAGNOSIS — Y838 Other surgical procedures as the cause of abnormal reaction of the patient, or of later complication, without mention of misadventure at the time of the procedure: Secondary | ICD-10-CM | POA: Diagnosis present

## 2018-06-03 DIAGNOSIS — E1122 Type 2 diabetes mellitus with diabetic chronic kidney disease: Secondary | ICD-10-CM | POA: Diagnosis not present

## 2018-06-03 DIAGNOSIS — G510 Bell's palsy: Secondary | ICD-10-CM | POA: Diagnosis present

## 2018-06-03 DIAGNOSIS — D496 Neoplasm of unspecified behavior of brain: Secondary | ICD-10-CM | POA: Diagnosis not present

## 2018-06-03 DIAGNOSIS — G970 Cerebrospinal fluid leak from spinal puncture: Secondary | ICD-10-CM | POA: Diagnosis not present

## 2018-06-03 DIAGNOSIS — G919 Hydrocephalus, unspecified: Secondary | ICD-10-CM

## 2018-06-03 DIAGNOSIS — Z4541 Encounter for adjustment and management of cerebrospinal fluid drainage device: Secondary | ICD-10-CM | POA: Diagnosis not present

## 2018-06-03 DIAGNOSIS — Z7984 Long term (current) use of oral hypoglycemic drugs: Secondary | ICD-10-CM | POA: Diagnosis not present

## 2018-06-03 DIAGNOSIS — D333 Benign neoplasm of cranial nerves: Secondary | ICD-10-CM | POA: Diagnosis present

## 2018-06-03 DIAGNOSIS — Z885 Allergy status to narcotic agent status: Secondary | ICD-10-CM

## 2018-06-03 DIAGNOSIS — G96198 Other disorders of meninges, not elsewhere classified: Secondary | ICD-10-CM | POA: Diagnosis present

## 2018-06-03 DIAGNOSIS — Z888 Allergy status to other drugs, medicaments and biological substances status: Secondary | ICD-10-CM | POA: Diagnosis not present

## 2018-06-03 DIAGNOSIS — Z982 Presence of cerebrospinal fluid drainage device: Secondary | ICD-10-CM | POA: Diagnosis not present

## 2018-06-03 DIAGNOSIS — Q059 Spina bifida, unspecified: Secondary | ICD-10-CM | POA: Diagnosis not present

## 2018-06-03 DIAGNOSIS — Z01818 Encounter for other preprocedural examination: Secondary | ICD-10-CM | POA: Diagnosis not present

## 2018-06-03 DIAGNOSIS — M47816 Spondylosis without myelopathy or radiculopathy, lumbar region: Secondary | ICD-10-CM | POA: Diagnosis not present

## 2018-06-03 DIAGNOSIS — R51 Headache: Secondary | ICD-10-CM | POA: Diagnosis not present

## 2018-06-03 DIAGNOSIS — I110 Hypertensive heart disease with heart failure: Secondary | ICD-10-CM | POA: Diagnosis not present

## 2018-06-03 LAB — COMPREHENSIVE METABOLIC PANEL
ALT: 5 U/L (ref 0–44)
AST: 17 U/L (ref 15–41)
Albumin: 3 g/dL — ABNORMAL LOW (ref 3.5–5.0)
Alkaline Phosphatase: 44 U/L (ref 38–126)
Anion gap: 9 (ref 5–15)
BUN: 43 mg/dL — ABNORMAL HIGH (ref 8–23)
CHLORIDE: 96 mmol/L — AB (ref 98–111)
CO2: 26 mmol/L (ref 22–32)
Calcium: 9.6 mg/dL (ref 8.9–10.3)
Creatinine, Ser: 2.93 mg/dL — ABNORMAL HIGH (ref 0.44–1.00)
GFR calc non Af Amer: 16 mL/min — ABNORMAL LOW (ref >60)
GFR, EST AFRICAN AMERICAN: 18 mL/min — AB (ref >60)
Glucose, Bld: 124 mg/dL — ABNORMAL HIGH (ref 70–99)
Potassium: 4.9 mmol/L (ref 3.5–5.1)
Sodium: 131 mmol/L — ABNORMAL LOW (ref 135–145)
Total Bilirubin: 1.3 mg/dL — ABNORMAL HIGH (ref 0.3–1.2)
Total Protein: 6.2 g/dL — ABNORMAL LOW (ref 6.5–8.1)

## 2018-06-03 LAB — CBC WITH DIFFERENTIAL/PLATELET
Abs Immature Granulocytes: 0.03 10*3/uL (ref 0.00–0.07)
BASOS ABS: 0.1 10*3/uL (ref 0.0–0.1)
Basophils Relative: 1 %
Eosinophils Absolute: 0.2 10*3/uL (ref 0.0–0.5)
Eosinophils Relative: 3 %
HCT: 34.2 % — ABNORMAL LOW (ref 36.0–46.0)
Hemoglobin: 11.4 g/dL — ABNORMAL LOW (ref 12.0–15.0)
IMMATURE GRANULOCYTES: 0 %
Lymphocytes Relative: 14 %
Lymphs Abs: 1.2 10*3/uL (ref 0.7–4.0)
MCH: 30.1 pg (ref 26.0–34.0)
MCHC: 33.3 g/dL (ref 30.0–36.0)
MCV: 90.2 fL (ref 80.0–100.0)
Monocytes Absolute: 0.8 10*3/uL (ref 0.1–1.0)
Monocytes Relative: 10 %
Neutro Abs: 5.9 10*3/uL (ref 1.7–7.7)
Neutrophils Relative %: 72 %
Platelets: 159 10*3/uL (ref 150–400)
RBC: 3.79 MIL/uL — AB (ref 3.87–5.11)
RDW: 11.1 % — ABNORMAL LOW (ref 11.5–15.5)
WBC: 7.9 10*3/uL (ref 4.0–10.5)
nRBC: 0 % (ref 0.0–0.2)

## 2018-06-03 LAB — LACTIC ACID, PLASMA
Lactic Acid, Venous: 0.8 mmol/L (ref 0.5–1.9)
Lactic Acid, Venous: 0.8 mmol/L (ref 0.5–1.9)

## 2018-06-03 LAB — MRSA PCR SCREENING: MRSA by PCR: NEGATIVE

## 2018-06-03 MED ORDER — ONDANSETRON HCL 4 MG PO TABS: 4.0000 mg | ORAL_TABLET | ORAL | Status: DC | PRN

## 2018-06-03 MED ORDER — FENTANYL CITRATE (PF) 100 MCG/2ML IJ SOLN
50.0000 ug | Freq: Once | INTRAMUSCULAR | Status: AC | PRN
  Administered 2018-06-04: 50 ug via INTRAVENOUS
  Filled 2018-06-03: qty 2

## 2018-06-03 MED ORDER — HYDROMORPHONE HCL 1 MG/ML IJ SOLN
0.5000 mg | INTRAMUSCULAR | Status: DC | PRN
  Administered 2018-06-04 – 2018-06-10 (×3): 0.5 mg via INTRAVENOUS
  Filled 2018-06-03 (×2): qty 1

## 2018-06-03 MED ORDER — GLIMEPIRIDE 1 MG PO TABS
1.0000 mg | ORAL_TABLET | Freq: Every day | ORAL | Status: DC
  Administered 2018-06-05 – 2018-06-15 (×10): 1 mg via ORAL
  Filled 2018-06-03 (×13): qty 1

## 2018-06-03 MED ORDER — PROMETHAZINE HCL 25 MG PO TABS
12.5000 mg | ORAL_TABLET | ORAL | Status: DC | PRN
  Administered 2018-06-05: 25 mg via ORAL
  Filled 2018-06-03: qty 1

## 2018-06-03 MED ORDER — NAPHAZOLINE-GLYCERIN 0.012-0.2 % OP SOLN
1.0000 [drp] | Freq: Four times a day (QID) | OPHTHALMIC | Status: DC
  Administered 2018-06-04 – 2018-06-15 (×41): 1 [drp] via OPHTHALMIC
  Filled 2018-06-03: qty 15

## 2018-06-03 MED ORDER — VITAMIN B-12 1000 MCG PO TABS
500.0000 ug | ORAL_TABLET | Freq: Every day | ORAL | Status: DC
  Administered 2018-06-05 – 2018-06-15 (×10): 500 ug via ORAL
  Filled 2018-06-03: qty 0.5
  Filled 2018-06-03: qty 1
  Filled 2018-06-03: qty 2
  Filled 2018-06-03: qty 1
  Filled 2018-06-03: qty 0.5
  Filled 2018-06-03 (×2): qty 1
  Filled 2018-06-03: qty 0.5
  Filled 2018-06-03 (×3): qty 1
  Filled 2018-06-03: qty 0.5
  Filled 2018-06-03: qty 2

## 2018-06-03 MED ORDER — HYDROCODONE-ACETAMINOPHEN 5-325 MG PO TABS
1.0000 | ORAL_TABLET | ORAL | Status: DC | PRN
  Administered 2018-06-04 – 2018-06-07 (×3): 1 via ORAL
  Filled 2018-06-03 (×4): qty 1

## 2018-06-03 MED ORDER — VITAMIN D 25 MCG (1000 UNIT) PO TABS
4000.0000 [IU] | ORAL_TABLET | Freq: Every day | ORAL | Status: DC
  Administered 2018-06-05 – 2018-06-15 (×10): 4000 [IU] via ORAL
  Filled 2018-06-03 (×14): qty 4

## 2018-06-03 MED ORDER — POLYETHYLENE GLYCOL 3350 17 G PO PACK: 17.0000 g | PACK | Freq: Every day | ORAL | Status: DC | PRN

## 2018-06-03 MED ORDER — DOCUSATE SODIUM 100 MG PO CAPS
100.0000 mg | ORAL_CAPSULE | Freq: Two times a day (BID) | ORAL | Status: DC
  Administered 2018-06-04 – 2018-06-15 (×18): 100 mg via ORAL
  Filled 2018-06-03 (×21): qty 1

## 2018-06-03 MED ORDER — HEPARIN SODIUM (PORCINE) 5000 UNIT/ML IJ SOLN
5000.0000 [IU] | Freq: Three times a day (TID) | INTRAMUSCULAR | Status: DC
  Administered 2018-06-05 – 2018-06-10 (×13): 5000 [IU] via SUBCUTANEOUS
  Filled 2018-06-03 (×13): qty 1

## 2018-06-03 MED ORDER — IRBESARTAN 150 MG PO TABS
150.0000 mg | ORAL_TABLET | Freq: Every day | ORAL | Status: DC
  Administered 2018-06-05 – 2018-06-15 (×10): 150 mg via ORAL
  Filled 2018-06-03 (×11): qty 1

## 2018-06-03 MED ORDER — ONDANSETRON HCL 4 MG/2ML IJ SOLN
4.0000 mg | INTRAMUSCULAR | Status: DC | PRN
  Administered 2018-06-05 – 2018-06-10 (×3): 4 mg via INTRAVENOUS
  Filled 2018-06-03 (×2): qty 2

## 2018-06-03 MED ORDER — ACETAMINOPHEN 325 MG PO TABS
650.0000 mg | ORAL_TABLET | ORAL | Status: DC | PRN
  Filled 2018-06-03: qty 2

## 2018-06-03 MED ORDER — FAMOTIDINE IN NACL 20-0.9 MG/50ML-% IV SOLN
20.0000 mg | Freq: Two times a day (BID) | INTRAVENOUS | Status: DC
  Filled 2018-06-03: qty 50

## 2018-06-03 MED ORDER — ACETAMINOPHEN 650 MG RE SUPP: 650.0000 mg | RECTAL | Status: DC | PRN

## 2018-06-03 MED ORDER — CARVEDILOL 12.5 MG PO TABS
12.5000 mg | ORAL_TABLET | Freq: Two times a day (BID) | ORAL | Status: DC
  Administered 2018-06-04 – 2018-06-15 (×21): 12.5 mg via ORAL
  Filled 2018-06-03 (×21): qty 1

## 2018-06-03 MED ORDER — LABETALOL HCL 5 MG/ML IV SOLN
10.0000 mg | INTRAVENOUS | Status: DC | PRN
  Administered 2018-06-04 – 2018-06-06 (×3): 20 mg via INTRAVENOUS
  Administered 2018-06-10: 10 mg via INTRAVENOUS
  Filled 2018-06-03 (×3): qty 4

## 2018-06-03 MED ORDER — AMLODIPINE BESYLATE 5 MG PO TABS
5.0000 mg | ORAL_TABLET | Freq: Every evening | ORAL | Status: DC
  Administered 2018-06-04 – 2018-06-14 (×10): 5 mg via ORAL
  Filled 2018-06-03 (×11): qty 1

## 2018-06-03 NOTE — H&P (Signed)
Neurosurgery H&P  CC: Wound leakage  HPI: This is a 68 y.o. woman that presents with clear drainage from her wound after a retrosigmoid craniotomy for removal of a vestibular schwannoma. No F/C/N/V, no purulence, clear fluid draining from wound, wound with swelling. No recent use of anti-platelet or anti-coagulant medications.   ROS: A 14 point ROS was performed and is negative except as noted in the HPI.   PMHx:  Past Medical History:  Diagnosis Date  . Anxiety   . Arthritis   . CHF (congestive heart failure) ([place])   . Chronic kidney disease   . Depression   . Diabetes mellitus without complication ([place])   . Hypertension   . Neuropathy   . Occult spina bifida    FamHx:  Family History  Problem Relation Age of Onset  . Cancer Mother        colon  . Cancer - Colon Father    SocHx:  reports that she has never smoked. She has never used smokeless tobacco. She reports that she does not drink alcohol or use drugs.  Exam: Vital signs in last 24 hours: Temp:  [98.7 F (37.1 C)] 98.7 F (37.1 C) (02/24 1126) Pulse Rate:  [69-82] 79 (02/24 1700) Resp:  [16-18] 18 (02/24 1700) BP: (128-158)/(63-85) 128/67 (02/24 1700) SpO2:  [90 %-97 %] 93 % (02/24 1700) Weight:  [94.3 kg] 94.3 kg (02/24 1127) General: Awake, alert, cooperative, lying in bed in NAD Head: normocephalic and atruamatic HEENT: neck supple Pulmonary: breathing room air comfortably, no evidence of increased work of breathing Cardiac: RRR Abdomen: S NT ND Extremities: warm and well perfused x4 Neuro: AOx3, PERRL, EOMI, +R HB 3 facial weakness Strength 5/5 x4, SILTx4, no drift   Assessment and Plan: 68 y.o. woman s/p resection of vestibular schwannoma, now with CSF drainage from wound. -CTH w/o contrast to evaluate for hydrocephalus -admit to ICU, will place lumbar drain and drain 10cc/hr x3d. Cannot have acetazolamide due to renal disease -regular diet now, NPO p MN, will give procedural sedation in ICU for LD  placement in AM  Judith Part, MD 06/03/18 5:59 PM [facility]

## 2018-06-03 NOTE — ED Notes (Signed)
RN informed that it is ok for Pt to have visitor.

## 2018-06-03 NOTE — ED Provider Notes (Signed)
[facility] EMERGENCY DEPARTMENT Provider Note   CSN: [id number] Arrival date & time: 06/03/18  1107    History   Chief Complaint Chief Complaint  Patient presents with  . Brain Tumor  . Post-op Problem    HPI Kalenna Millett is a 68 y.o. female.     Patient indicates s/p schwannoma surgery in January, and that since surgery had developed swollen area at surgical site, right posterior auricular area. The skin incision/surgical wound is intact. No purulent drainage. Area is not painful. Denies headaches. No fever or chills. States has seen her neurosurgeon for same within the past week, and he told her to go to the ER today, and for [place] to call him. Pt denies any acute or abrupt worsening of her symptoms today. No new numbness/weakness. No new or worsening headaches. No redness or drainage to area.   The history is provided by the patient and the spouse.    Past Medical History:  Diagnosis Date  . Anxiety   . Arthritis   . CHF (congestive heart failure) ([place])   . Chronic kidney disease   . Depression   . Diabetes mellitus without complication ([place])   . Hypertension   . Neuropathy   . Occult spina bifida     Patient Active Problem List   Diagnosis Date Noted  . Suture reaction 05/10/2018  . Acute on chronic renal failure ([place])   . Hypertension   . Diabetes mellitus type 2 in obese ([place])   . CKD (chronic kidney disease)   . Schwannoma of vestibular nerve determined by biopsy ([place])   . Hypoalbuminemia due to protein-calorie malnutrition ([place])   . Essential hypertension   . Acute blood loss anemia   . Vestibular dysfunction of right ear   . Vestibular schwannoma ([place]) 04/16/2018  . Brain tumor ([place]) 04/16/2018  . Cellulitis and abscess of right leg   . 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 Primary osteoarthritis of right knee   . Nephrotic syndrome 05/15/2016  . CHF (congestive heart failure) ([place]) 05/12/2016  . DM (diabetes  mellitus), type 2 with renal complications ([place]) 54/98/2641  . Hypertensive urgency 05/12/2016  . anasarca 05/12/2016  . Immobility 05/12/2016  . Proteinuria 05/12/2016  . Uncontrolled hypertension 08/19/2015    Past Surgical History:  Procedure Laterality Date  . APPLICATION OF CRANIAL NAVIGATION N/A 04/16/2018   Procedure: APPLICATION OF CRANIAL NAVIGATION;  Surgeon: Judith Part, MD;  Location: [facility];  Service: Neurosurgery;  Laterality: N/A;  . CRANIOTOMY Right 04/16/2018   Procedure: Right craniotomy for tumor resection with brainlab;  Surgeon: Judith Part, MD;  Location: [facility];  Service: Neurosurgery;  Laterality: Right;  right  . ectopic    . INCISION AND DRAINAGE Right 10/04/2016   Procedure: INCISION AND DRAINAGE AND ASPIRATION;  Surgeon: Carole Civil, MD;  Location: [facility];  Service: Orthopedics;  Laterality: Right;  right knee wound  . patella fx Right 07/2015  . TOTAL KNEE ARTHROPLASTY Right 07/20/2016   Procedure: TOTAL KNEE ARTHROPLASTY;  Surgeon: Carole Civil, MD;  Location: [facility];  Service: Orthopedics;  Laterality: Right;     OB History   No obstetric history on file.      Home Medications    Prior to Admission medications   Medication Sig Start Date End Date Taking? Authorizing Provider  amLODipine (NORVASC) 5 MG tablet Take 2 tablets (10 mg total) by mouth at bedtime. Patient taking differently: Take 5  mg by mouth every evening.  05/10/18  Yes Love, Ivan Anchors, PA-C  carvedilol (COREG) 25 MG tablet Take 1 tablet (25 mg total) by mouth 2 (two) times daily with a meal. Patient taking differently: Take 12.5 mg by mouth 2 (two) times daily with a meal.  05/10/18  Yes Love, Ivan Anchors, PA-C  Cholecalciferol (VITAMIN D) 50 MCG (2000 UT) CAPS Take 4,000 Units by mouth daily.   Yes [provider]  glimepiride (AMARYL) 1 MG tablet Take 1 tablet (1 mg total) by mouth daily with breakfast. 05/10/18  Yes Love, Ivan Anchors, PA-C  naphazoline-glycerin  (CLEAR EYES REDNESS) 0.012-0.2 % SOLN Place 1 drop into the right eye 4 (four) times daily. 05/02/18  Yes Love, Ivan Anchors, PA-C  olmesartan (BENICAR) 20 MG tablet Take 1 tablet (20 mg total) by mouth daily. 05/10/18  Yes Love, Ivan Anchors, PA-C  vitamin B-12 (CYANOCOBALAMIN) 500 MCG tablet Take 500 mcg by mouth daily.   Yes [provider]  acetaminophen (TYLENOL) 325 MG tablet Take 1-2 tablets (325-650 mg total) by mouth every 4 (four) hours as needed for mild pain. Patient not taking: Reported on 06/03/2018 05/02/18   Love, Ivan Anchors, PA-C  bethanechol (URECHOLINE) 10 MG tablet Take 1 tablet (10 mg total) by mouth 3 (three) times daily. Patient not taking: Reported on 06/03/2018 05/10/18   Love, Ivan Anchors, PA-C  furosemide (LASIX) 20 MG tablet Take 1 tablet (20 mg total) by mouth daily. Patient not taking: Reported on 06/03/2018 05/11/18   Love, Ivan Anchors, PA-C    Family History Family History  Problem Relation Age of Onset  . Cancer Mother        colon  . Cancer - Colon Father     Social History Social History   Tobacco Use  . Smoking status: Never Smoker  . Smokeless tobacco: Never Used  Substance Use Topics  . Alcohol use: No  . Drug use: No     Allergies   Corticosteroids; Gluten meal; and Oxycodone   Review of Systems Review of Systems  Constitutional: Negative for fever.  HENT: Negative for sore throat.   Eyes: Negative for redness.  Respiratory: Negative for shortness of breath.   Cardiovascular: Negative for chest pain.  Gastrointestinal: Negative for abdominal pain and vomiting.  Genitourinary: Negative for flank pain.  Musculoskeletal: Negative for back pain and neck pain.  Skin: Negative for rash.  Neurological: Negative for headaches.  Hematological: Does not bruise/bleed easily.  Psychiatric/Behavioral: Negative for confusion.     Physical Exam Updated Vital Signs BP (!) 144/73 (BP Location: Left Arm)   Pulse 72   Temp 98.7 F (37.1 C) (Oral)   Resp  17   Ht 1.651 m (5\' 5" )   Wt 94.3 kg   SpO2 97%   BMI 34.61 kg/m   Physical Exam Vitals signs and nursing note reviewed.  Constitutional:      Appearance: Normal appearance. She is well-developed.  HENT:     Head: Atraumatic.     Comments: Soft, swollen, fluctuant, non tender area right posterior auricular region. Skin is intact. No erythema. No purulent drainage, 3 cm by 8 cm.     Nose: Nose normal.     Mouth/Throat:     Mouth: Mucous membranes are moist.  Eyes:     General: No scleral icterus.    Pupils: Pupils are equal, round, and reactive to light.     Comments: Right conjunctiva erythematous (pt/spouse note that since surgery eyelid doesn't  closed, they tape shut and use moisturizing eye drops).   Neck:     Musculoskeletal: Normal range of motion and neck supple. No neck rigidity or muscular tenderness.     Trachea: No tracheal deviation.  Cardiovascular:     Rate and Rhythm: Normal rate and regular rhythm.     Pulses: Normal pulses.     Heart sounds: Normal heart sounds. No murmur. No friction rub. No gallop.   Pulmonary:     Effort: Pulmonary effort is normal. No respiratory distress.     Breath sounds: Normal breath sounds.  Abdominal:     General: Bowel sounds are normal. There is no distension.     Palpations: Abdomen is soft.     Tenderness: There is no abdominal tenderness. There is no guarding.  Genitourinary:    Comments: No cva tenderness.  Musculoskeletal:        General: No swelling.  Skin:    General: Skin is warm and dry.     Findings: No rash.  Neurological:     Mental Status: She is alert.     Comments: Alert, speech normal. Right facial weakness/droop - old per pt/fam.   Psychiatric:        Mood and Affect: Mood normal.      ED Treatments / Results  Labs (all labs ordered are listed, but only abnormal results are displayed) Results for orders placed or performed during the hospital encounter of 06/03/18  Lactic acid, plasma  Result Value  Ref Range   Lactic Acid, Venous 0.8 0.5 - 1.9 mmol/L  Lactic acid, plasma  Result Value Ref Range   Lactic Acid, Venous 0.8 0.5 - 1.9 mmol/L  Comprehensive metabolic panel  Result Value Ref Range   Sodium 131 (L) 135 - 145 mmol/L   Potassium 4.9 3.5 - 5.1 mmol/L   Chloride 96 (L) 98 - 111 mmol/L   CO2 26 22 - 32 mmol/L   Glucose, Bld 124 (H) 70 - 99 mg/dL   BUN 43 (H) 8 - 23 mg/dL   Creatinine, Ser 2.93 (H) 0.44 - 1.00 mg/dL   Calcium 9.6 8.9 - 10.3 mg/dL   Total Protein 6.2 (L) 6.5 - 8.1 g/dL   Albumin 3.0 (L) 3.5 - 5.0 g/dL   AST 17 15 - 41 U/L   ALT 5 0 - 44 U/L   Alkaline Phosphatase 44 38 - 126 U/L   Total Bilirubin 1.3 (H) 0.3 - 1.2 mg/dL   GFR calc non Af Amer 16 (L) >60 mL/min   GFR calc Af Amer 18 (L) >60 mL/min   Anion gap 9 5 - 15  CBC with Differential  Result Value Ref Range   WBC 7.9 4.0 - 10.5 K/uL   RBC 3.79 (L) 3.87 - 5.11 MIL/uL   Hemoglobin 11.4 (L) 12.0 - 15.0 g/dL   HCT 34.2 (L) 36.0 - 46.0 %   MCV 90.2 80.0 - 100.0 fL   MCH 30.1 26.0 - 34.0 pg   MCHC 33.3 30.0 - 36.0 g/dL   RDW 11.1 (L) 11.5 - 15.5 %   Platelets 159 150 - 400 K/uL   nRBC 0.0 0.0 - 0.2 %   Neutrophils Relative % 72 %   Neutro Abs 5.9 1.7 - 7.7 K/uL   Lymphocytes Relative 14 %   Lymphs Abs 1.2 0.7 - 4.0 K/uL   Monocytes Relative 10 %   Monocytes Absolute 0.8 0.1 - 1.0 K/uL   Eosinophils Relative 3 %   Eosinophils  Absolute 0.2 0.0 - 0.5 K/uL   Basophils Relative 1 %   Basophils Absolute 0.1 0.0 - 0.1 K/uL   Immature Granulocytes 0 %   Abs Immature Granulocytes 0.03 0.00 - 0.07 K/uL    EKG None  Radiology No results found.  Procedures Procedures (including critical care time)  Medications Ordered in ED Medications - No data to display   Initial Impression / Assessment and Plan / ED Course  I have reviewed the triage vital signs and the nursing notes.  Pertinent labs & imaging results that were available during my care of the patient were reviewed by me and considered  in my medical decision making (see chart for details).  Neurosurgery consulted.   Reviewed nursing notes and prior charts for additional history.   Labs reviewed - wbc normal.  Discussed with NS, Dr Jenetta Downer - he indicates he will admit, and he will place admit orders.   Recheck pt, comfortable. No distress. Updated on plan.     Final Clinical Impressions(s) / ED Diagnoses   Final diagnoses:  None    ED Discharge Orders    None       Lajean Saver, MD 06/03/18 1450

## 2018-06-03 NOTE — ED Triage Notes (Signed)
Patient sent to the ED for evaluation by Dr Kathi Der (Neuro Surgeon) for post op problem. Husband states patient had brain tumor removed 04/16/18 and two weeks ago developed swelling at surgical site increasing in size overtime. Husband stated patient will be admitted for spinal tap and other test. Denies pain currently husband states at times looks like the area is "Sweating"

## 2018-06-04 ENCOUNTER — Encounter (HOSPITAL_COMMUNITY): Payer: Self-pay

## 2018-06-04 ENCOUNTER — Other Ambulatory Visit: Payer: Self-pay

## 2018-06-04 ENCOUNTER — Ambulatory Visit (HOSPITAL_COMMUNITY): Payer: Medicare Other

## 2018-06-04 DIAGNOSIS — G960 Cerebrospinal fluid leak: Secondary | ICD-10-CM | POA: Diagnosis not present

## 2018-06-04 LAB — GLUCOSE, CAPILLARY
Glucose-Capillary: 152 mg/dL — ABNORMAL HIGH (ref 70–99)
Glucose-Capillary: 137 mg/dL — ABNORMAL HIGH (ref 70–99)

## 2018-06-04 MED ORDER — FAMOTIDINE IN NACL 20-0.9 MG/50ML-% IV SOLN
20.0000 mg | INTRAVENOUS | Status: DC
  Administered 2018-06-04 – 2018-06-05 (×2): 20 mg via INTRAVENOUS
  Filled 2018-06-04 (×2): qty 50

## 2018-06-04 NOTE — Progress Notes (Signed)
Neurosurgery Service Progress Note  Subjective: No acute events overnight, no new complaints, still having CSF drainage from wound   Objective: Vitals:   06/04/18 0400 06/04/18 0500 06/04/18 0600 06/04/18 0700  BP: (!) 153/82 (!) 171/77 (!) 176/80 (!) 146/63  Pulse: 79 80 73 63  Resp: (!) 21 (!) 25 20 16   Temp: 97.6 F (36.4 C)     TempSrc: Oral     SpO2: (!) 84% (!) 89% 91% 100%  Weight:      Height:       Temp (24hrs), Avg:98.6 F (37 C), Min:97.6 F (36.4 C), Max:99 F (37.2 C)  CBC Latest Ref Rng & Units 06/03/2018 05/08/2018 04/30/2018  WBC 4.0 - 10.5 K/uL 7.9 6.2 8.2  Hemoglobin 12.0 - 15.0 g/dL 11.4(L) 10.9(L) 11.8(L)  Hematocrit 36.0 - 46.0 % 34.2(L) 33.9(L) 37.5  Platelets 150 - 400 K/uL 159 186 289   BMP Latest Ref Rng & Units 06/03/2018 05/10/2018 05/09/2018  Glucose 70 - 99 mg/dL 124(H) 103(H) 99  BUN 8 - 23 mg/dL 43(H) 53(H) 53(H)  Creatinine 0.44 - 1.00 mg/dL 2.93(H) 4.01(H) 4.06(H)  Sodium 135 - 145 mmol/L 131(L) 135 136  Potassium 3.5 - 5.1 mmol/L 4.9 4.8 4.7  Chloride 98 - 111 mmol/L 96(L) 88(L) 91(L)  CO2 22 - 32 mmol/L 26 35(H) 35(H)  Calcium 8.9 - 10.3 mg/dL 9.6 9.8 9.5    Intake/Output Summary (Last 24 hours) at 06/04/2018 9924 Last data filed at 06/04/2018 0400 Gross per 24 hour  Intake -  Output 500 ml  Net -500 ml    Current Facility-Administered Medications:  .  acetaminophen (TYLENOL) tablet 650 mg, 650 mg, Oral, Q4H PRN **OR** acetaminophen (TYLENOL) suppository 650 mg, 650 mg, Rectal, Q4H PRN, Kevyn Boquet A, MD .  amLODipine (NORVASC) tablet 5 mg, 5 mg, Oral, QPM, Eunice Winecoff A, MD .  carvedilol (COREG) tablet 12.5 mg, 12.5 mg, Oral, BID WC, Thamar Holik, Joyice Faster, MD .  cholecalciferol (VITAMIN D3) tablet 4,000 Units, 4,000 Units, Oral, Daily, Jaxon Mynhier A, MD .  docusate sodium (COLACE) capsule 100 mg, 100 mg, Oral, BID, Katheleen Stella, Joyice Faster, MD .  famotidine (PEPCID) IVPB 20 mg premix, 20 mg, Intravenous, Q12H, Cecilie Heidel,  Rykin Route A, MD .  fentaNYL (SUBLIMAZE) injection 50 mcg, 50 mcg, Intravenous, Once PRN, Judith Part, MD .  glimepiride (AMARYL) tablet 1 mg, 1 mg, Oral, Q breakfast, Sharnese Heath, Joyice Faster, MD .  Derrill Memo ON 06/05/2018] heparin injection 5,000 Units, 5,000 Units, Subcutaneous, Q8H, Isley Weisheit A, MD .  HYDROcodone-acetaminophen (NORCO/VICODIN) 5-325 MG per tablet 1 tablet, 1 tablet, Oral, Q4H PRN, Judith Part, MD .  HYDROmorphone (DILAUDID) injection 0.5 mg, 0.5 mg, Intravenous, Q3H PRN, Judith Part, MD .  irbesartan (AVAPRO) tablet 150 mg, 150 mg, Oral, Daily, Jaylee Freeze A, MD .  labetalol (NORMODYNE,TRANDATE) injection 10-40 mg, 10-40 mg, Intravenous, Q10 min PRN, Judith Part, MD, 20 mg at 06/04/18 0604 .  naphazoline-glycerin (CLEAR EYES REDNESS) ophth solution 1 drop, 1 drop, Right Eye, QID, Jacqulynn Shappell A, MD .  ondansetron (ZOFRAN) tablet 4 mg, 4 mg, Oral, Q4H PRN **OR** ondansetron (ZOFRAN) injection 4 mg, 4 mg, Intravenous, Q4H PRN, Becca Bayne A, MD .  polyethylene glycol (MIRALAX / GLYCOLAX) packet 17 g, 17 g, Oral, Daily PRN, Judith Part, MD .  promethazine (PHENERGAN) tablet 12.5-25 mg, 12.5-25 mg, Oral, Q4H PRN, Judith Part, MD .  vitamin B-12 (CYANOCOBALAMIN) tablet 500 mcg, 500 mcg, Oral, Daily, Laurabelle Gorczyca, Joyice Faster,  MD   Physical Exam: AOx3, PERRL, EOMI, +R LMN HB3, Strength 5/5 x4, SILTx4, no drift  Assessment & Plan: 68 y.o. 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 2/24 CTH no ventriculomegaly, 2/25 LD placement -place LD at bedside this morning -drain 10cc/hr x3d then clamp and evaluate for drainage -can be out of bed as tolerated when LD is clamped -SCDs/TEDs, hold SQH until tomorrow  Judith Part  06/04/18 7:35 AM

## 2018-06-04 NOTE — Procedures (Signed)
PREOP DIAGNOSIS:  1. Pseudomeningocele, CSF leak   POSTOP DIAGNOSIS: Same  PROCEDURE: Lumbar subarachnoid drain placement  SURGEON: Emelda Brothers, MD  ASSISTANT: None  ANESTHESIA: IV sedation with local  EBL: Minimal  SPECIMENS: none  DRAINS: Lumbar drain  COMPLICATIONS: none  CONDITION: stable  HISTORY: Natalie Friedman is a 68 y.o. female with a history of a recent resection of a vestibular schwannoma. She presented with CSF leakage from her wound and was therefore admitted for lumbar drain placement.   PROCEDURE IN DETAIL: Consent was obtained. The patient was positioned in the right lateral decubitus position. The area was prepped and draped in a sterile fashion and a time out was performed. The intercristal plane and spinous processes were palpated and the localized interspace was injected with local anesthesia in the midline. A spinal needle was inserted and stylet removed with flow of clear CSF. A lumbar drain catheter was threaded without resistance with flow of CSF through the catheter. The needle was then removed while holding the catheter in place with continued good flow. It was then secured and dressing was applied.

## 2018-06-05 LAB — GLUCOSE, CAPILLARY
Glucose-Capillary: 109 mg/dL — ABNORMAL HIGH (ref 70–99)
Glucose-Capillary: 131 mg/dL — ABNORMAL HIGH (ref 70–99)
Glucose-Capillary: 253 mg/dL — ABNORMAL HIGH (ref 70–99)
Glucose-Capillary: 74 mg/dL (ref 70–99)

## 2018-06-05 MED ORDER — INSULIN ASPART 100 UNIT/ML ~~LOC~~ SOLN
0.0000 [IU] | Freq: Three times a day (TID) | SUBCUTANEOUS | Status: DC
  Administered 2018-06-05: 8 [IU] via SUBCUTANEOUS
  Administered 2018-06-07: 3 [IU] via SUBCUTANEOUS
  Administered 2018-06-08 – 2018-06-09 (×3): 2 [IU] via SUBCUTANEOUS

## 2018-06-05 NOTE — Progress Notes (Signed)
Patient lumbar drain catheter found in patient bed and no longer in patient during 2000 assessment. Insertion site assessed and no bleeding, leakage, or injury noted, NSG notified and new drain will potentially be placed tomorrow. Placed gauze and tegaderm dressing over site, will monitor patient.  Candy Sledge, RN

## 2018-06-05 NOTE — Progress Notes (Signed)
Neurosurgery Service Progress Note  Subjective: No acute events overnight, no drainage from wound overnight, but having some new nausea this morning  Objective: Vitals:   06/05/18 0400 06/05/18 0500 06/05/18 0600 06/05/18 0700  BP: (!) 127/58 (!) 133/57 140/76 126/71  Pulse: 66 72 (!) 57 (!) 53  Resp: 17 18 (!) 23 20  Temp: 98.2 F (36.8 C)     TempSrc: Axillary     SpO2: 98% 98% 96% 95%  Weight:      Height:       Temp (24hrs), Avg:99.1 F (37.3 C), Min:98.2 F (36.8 C), Max:100.1 F (37.8 C)  CBC Latest Ref Rng & Units 06/03/2018 05/08/2018 04/30/2018  WBC 4.0 - 10.5 K/uL 7.9 6.2 8.2  Hemoglobin 12.0 - 15.0 g/dL 11.4(L) 10.9(L) 11.8(L)  Hematocrit 36.0 - 46.0 % 34.2(L) 33.9(L) 37.5  Platelets 150 - 400 K/uL 159 186 289   BMP Latest Ref Rng & Units 06/03/2018 05/10/2018 05/09/2018  Glucose 70 - 99 mg/dL 124(H) 103(H) 99  BUN 8 - 23 mg/dL 43(H) 53(H) 53(H)  Creatinine 0.44 - 1.00 mg/dL 2.93(H) 4.01(H) 4.06(H)  Sodium 135 - 145 mmol/L 131(L) 135 136  Potassium 3.5 - 5.1 mmol/L 4.9 4.8 4.7  Chloride 98 - 111 mmol/L 96(L) 88(L) 91(L)  CO2 22 - 32 mmol/L 26 35(H) 35(H)  Calcium 8.9 - 10.3 mg/dL 9.6 9.8 9.5    Intake/Output Summary (Last 24 hours) at 06/05/2018 0831 Last data filed at 06/05/2018 0700 Gross per 24 hour  Intake 450 ml  Output 850 ml  Net -400 ml    Current Facility-Administered Medications:  .  acetaminophen (TYLENOL) tablet 650 mg, 650 mg, Oral, Q4H PRN **OR** acetaminophen (TYLENOL) suppository 650 mg, 650 mg, Rectal, Q4H PRN, Kimball Appleby A, MD .  amLODipine (NORVASC) tablet 5 mg, 5 mg, Oral, QPM, Audreanna Torrisi, Joyice Faster, MD, 5 mg at 06/04/18 1913 .  carvedilol (COREG) tablet 12.5 mg, 12.5 mg, Oral, BID WC, Samy Ryner A, MD, 12.5 mg at 06/04/18 1714 .  cholecalciferol (VITAMIN D3) tablet 4,000 Units, 4,000 Units, Oral, Daily, Annia Gomm A, MD .  docusate sodium (COLACE) capsule 100 mg, 100 mg, Oral, BID, Judith Part, MD, 100 mg at  06/04/18 2153 .  famotidine (PEPCID) IVPB 20 mg premix, 20 mg, Intravenous, Q24H, [place], Valeda Malm, RPH, Stopped at 06/04/18 1128 .  glimepiride (AMARYL) tablet 1 mg, 1 mg, Oral, Q breakfast, Shahrzad Koble A, MD .  heparin injection 5,000 Units, 5,000 Units, Subcutaneous, Q8H, Judith Part, MD, 5,000 Units at 06/05/18 [phone] .  HYDROcodone-acetaminophen (NORCO/VICODIN) 5-325 MG per tablet 1 tablet, 1 tablet, Oral, Q4H PRN, Judith Part, MD, 1 tablet at 06/05/18 0220 .  HYDROmorphone (DILAUDID) injection 0.5 mg, 0.5 mg, Intravenous, Q3H PRN, Judith Part, MD, 0.5 mg at 06/04/18 2221 .  irbesartan (AVAPRO) tablet 150 mg, 150 mg, Oral, Daily, Davante Gerke A, MD .  labetalol (NORMODYNE,TRANDATE) injection 10-40 mg, 10-40 mg, Intravenous, Q10 min PRN, Judith Part, MD, 20 mg at 06/04/18 2301 .  naphazoline-glycerin (CLEAR EYES REDNESS) ophth solution 1 drop, 1 drop, Right Eye, QID, Judith Part, MD, 1 drop at 06/04/18 2153 .  ondansetron (ZOFRAN) tablet 4 mg, 4 mg, Oral, Q4H PRN **OR** ondansetron (ZOFRAN) injection 4 mg, 4 mg, Intravenous, Q4H PRN, Judith Part, MD, 4 mg at 06/05/18 0816 .  polyethylene glycol (MIRALAX / GLYCOLAX) packet 17 g, 17 g, Oral, Daily PRN, Judith Part, MD .  promethazine (PHENERGAN) tablet 12.5-25  mg, 12.5-25 mg, Oral, Q4H PRN, Judith Part, MD .  vitamin B-12 (CYANOCOBALAMIN) tablet 500 mcg, 500 mcg, Oral, Daily, Antwoin Lackey, Joyice Faster, MD   Physical Exam: AOx3, PERRL, EOMI, +R LMN HB3, Strength 5/5 x4, SILTx4, no drift Incision / pseudomeningocele flat, no drainage  Assessment & Plan: 68 y.o. 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 2/24 CTH no ventriculomegaly, 2/25 LD placement -decrease drainage to 5cc/hr in case this is worsening her nausea. Not having any headaches so questionable if low pressure symptoms, she does have nausea at  home but does not usually vomit -can be out of bed as tolerated when LD is clamped -SCDs/TEDs, SQH  Judith Part  06/05/18 8:31 AM

## 2018-06-06 LAB — GLUCOSE, CAPILLARY
Glucose-Capillary: 101 mg/dL — ABNORMAL HIGH (ref 70–99)
Glucose-Capillary: 109 mg/dL — ABNORMAL HIGH (ref 70–99)
Glucose-Capillary: 82 mg/dL (ref 70–99)
Glucose-Capillary: 81 mg/dL (ref 70–99)

## 2018-06-06 MED ORDER — CHLORHEXIDINE GLUCONATE CLOTH 2 % EX PADS
6.0000 | MEDICATED_PAD | Freq: Every day | CUTANEOUS | Status: DC
  Administered 2018-06-06 – 2018-06-15 (×10): 6 via TOPICAL

## 2018-06-06 MED ORDER — FAMOTIDINE 20 MG PO TABS
20.0000 mg | ORAL_TABLET | Freq: Every day | ORAL | Status: DC
  Administered 2018-06-06 – 2018-06-15 (×9): 20 mg via ORAL
  Filled 2018-06-06 (×9): qty 1

## 2018-06-06 NOTE — Progress Notes (Signed)
Neurosurgery Service Progress Note  Subjective: No acute events overnight, LD came out overnight, pt unsure if she has had any leakage since then.   Objective: Vitals:   06/06/18 0500 06/06/18 0600 06/06/18 0700 06/06/18 0800  BP: (!) 141/71 (!) 152/64 131/81   Pulse: 80 74 80   Resp: 14 (!) 21 20   Temp:    99.1 F (37.3 C)  TempSrc:    Oral  SpO2: 98% 97% 92%   Weight:      Height:       Temp (24hrs), Avg:98.9 F (37.2 C), Min:98.2 F (36.8 C), Max:99.8 F (37.7 C)  CBC Latest Ref Rng & Units 06/03/2018 05/08/2018 04/30/2018  WBC 4.0 - 10.5 K/uL 7.9 6.2 8.2  Hemoglobin 12.0 - 15.0 g/dL 11.4(L) 10.9(L) 11.8(L)  Hematocrit 36.0 - 46.0 % 34.2(L) 33.9(L) 37.5  Platelets 150 - 400 K/uL 159 186 289   BMP Latest Ref Rng & Units 06/03/2018 05/10/2018 05/09/2018  Glucose 70 - 99 mg/dL 124(H) 103(H) 99  BUN 8 - 23 mg/dL 43(H) 53(H) 53(H)  Creatinine 0.44 - 1.00 mg/dL 2.93(H) 4.01(H) 4.06(H)  Sodium 135 - 145 mmol/L 131(L) 135 136  Potassium 3.5 - 5.1 mmol/L 4.9 4.8 4.7  Chloride 98 - 111 mmol/L 96(L) 88(L) 91(L)  CO2 22 - 32 mmol/L 26 35(H) 35(H)  Calcium 8.9 - 10.3 mg/dL 9.6 9.8 9.5    Intake/Output Summary (Last 24 hours) at 06/06/2018 7619 Last data filed at 06/05/2018 2000 Gross per 24 hour  Intake -  Output 985 ml  Net -985 ml    Current Facility-Administered Medications:  .  acetaminophen (TYLENOL) tablet 650 mg, 650 mg, Oral, Q4H PRN **OR** acetaminophen (TYLENOL) suppository 650 mg, 650 mg, Rectal, Q4H PRN, Alfrieda Tarry A, MD .  amLODipine (NORVASC) tablet 5 mg, 5 mg, Oral, QPM, Pradyun Ishman A, MD, 5 mg at 06/05/18 1703 .  carvedilol (COREG) tablet 12.5 mg, 12.5 mg, Oral, BID WC, Alejos Reinhardt, Joyice Faster, MD, 12.5 mg at 06/06/18 0824 .  Chlorhexidine Gluconate Cloth 2 % PADS 6 each, 6 each, Topical, Q0600, Judith Part, MD .  cholecalciferol (VITAMIN D3) tablet 4,000 Units, 4,000 Units, Oral, Daily, Judith Part, MD, 4,000 Units at 06/05/18 1229 .   docusate sodium (COLACE) capsule 100 mg, 100 mg, Oral, BID, Judith Part, MD, 100 mg at 06/05/18 2105 .  famotidine (PEPCID) IVPB 20 mg premix, 20 mg, Intravenous, Q24H, Rumbarger, Valeda Malm, RPH, Last Rate: 100 mL/hr at 06/05/18 1234, 20 mg at 06/05/18 1234 .  glimepiride (AMARYL) tablet 1 mg, 1 mg, Oral, Q breakfast, Britne Borelli A, MD, 1 mg at 06/06/18 0824 .  heparin injection 5,000 Units, 5,000 Units, Subcutaneous, Q8H, Judith Part, MD, 5,000 Units at 06/06/18 0606 .  HYDROcodone-acetaminophen (NORCO/VICODIN) 5-325 MG per tablet 1 tablet, 1 tablet, Oral, Q4H PRN, Judith Part, MD, 1 tablet at 06/05/18 0220 .  HYDROmorphone (DILAUDID) injection 0.5 mg, 0.5 mg, Intravenous, Q3H PRN, Judith Part, MD, 0.5 mg at 06/05/18 0840 .  insulin aspart (novoLOG) injection 0-15 Units, 0-15 Units, Subcutaneous, TID WC, Judith Part, MD, 8 Units at 06/05/18 1702 .  irbesartan (AVAPRO) tablet 150 mg, 150 mg, Oral, Daily, Benjamim Harnish A, MD, 150 mg at 06/05/18 1230 .  labetalol (NORMODYNE,TRANDATE) injection 10-40 mg, 10-40 mg, Intravenous, Q10 min PRN, Judith Part, MD, 20 mg at 06/06/18 0830 .  naphazoline-glycerin (CLEAR EYES REDNESS) ophth solution 1 drop, 1 drop, Right Eye, QID, Arelene Moroni, Joyice Faster,  MD, 1 drop at 06/05/18 2105 .  ondansetron (ZOFRAN) tablet 4 mg, 4 mg, Oral, Q4H PRN **OR** ondansetron (ZOFRAN) injection 4 mg, 4 mg, Intravenous, Q4H PRN, Judith Part, MD, 4 mg at 06/05/18 0816 .  polyethylene glycol (MIRALAX / GLYCOLAX) packet 17 g, 17 g, Oral, Daily PRN, Judith Part, MD .  promethazine (PHENERGAN) tablet 12.5-25 mg, 12.5-25 mg, Oral, Q4H PRN, Judith Part, MD, 25 mg at 06/05/18 1245 .  vitamin B-12 (CYANOCOBALAMIN) tablet 500 mcg, 500 mcg, Oral, Daily, Judith Part, MD, 500 mcg at 06/05/18 1229   Physical Exam: AOx1, PERRL, EOMI, +R LMN HB3, Strength 5/5 x4, SILTx4, no drift Pseudomeningocele present again, no  active drainage with palpation  Assessment & Plan: 68 y.o. 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 2/24 CTH no ventriculomegaly, 2/25 LD placement, 2/26 LD came out ~20:00, 2/27 pseudomeningocele recurred -placed chux pad underneath wound, will monitor for any recurrent drainage, no obvious drainage this morning. If so, will likely require shunt placement given inability to tolerate acetazolamide due to renal issues. Will monitor to see if she leaks again. -SCDs/TEDs, SQH  Jasmene Goswami A Kaylob Wallen  06/06/18 9:10 AM

## 2018-06-06 NOTE — Evaluation (Signed)
Physical Therapy Evaluation Patient Details Name: Natalie Friedman MRN: [id number] DOB: 1950-05-22 Today's Date: 06/06/2018   History of Present Illness  68 y.o. female who had resection of vestibular schwannoma on 04/16/18 now presents with clear drainage from her wound due to CSF leak s/p lumbar drain 2/25 but came out night of 2/26. 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  Clinical Impression  Patient presents with decreased mobility due to generalized weakness, imbalance, decreased activity tolerance related to issues residual from her surgery in January.  Currently mod A and spouse assisting at home.  Feel she may improve to at least minguard to S level if able to return for CIR stay.  PT to follow acutely.     Follow Up Recommendations CIR    Equipment Recommendations  None recommended by PT    Recommendations for Other Services Rehab consult     Precautions / Restrictions Precautions Precautions: Fall Precaution Comments: continued drainage from wound      Mobility  Bed Mobility Overal bed mobility: Needs Assistance Bed Mobility: Supine to Sit     Supine to sit: HOB elevated;Supervision     General bed mobility comments: assist for safety/lines  Transfers Overall transfer level: Needs assistance Equipment used: Rolling walker (2 wheeled) Transfers: Sit to/from [employer] Sit to Stand: Mod assist Stand pivot transfers: Min assist;Mod assist       General transfer comment: lifting assist from EOB, assist for balance initially due to posterior bias and R foot not flat on floor.  Then when balanced, able to take steps to turn and bach to chair min A.   Ambulation/Gait                Stairs            Wheelchair Mobility    Modified Rankin (Stroke Patients Only)       Balance Overall balance assessment: Needs assistance   Sitting balance-Leahy  Scale: Fair     Standing balance support: Bilateral upper extremity supported Standing balance-Leahy Scale: Poor Standing balance comment: mod A and UE support at times for balance                             Pertinent Vitals/Pain Pain Assessment: No/denies pain    Home Living Family/patient expects to be discharged to:: Private residence Living Arrangements: Spouse/significant other Available Help at Discharge: Family;Available 24 hours/day Type of Home: House Home Access: Stairs to enter;Ramped entrance     Home Layout: Two level;Able to live on main level with bedroom/bathroom Home Equipment: Kasandra Knudsen - single point;Walker - 2 wheels;Shower seat;Wheelchair - manual      Prior Function Level of Independence: Needs assistance   Gait / Transfers Assistance Needed: spouse assisting with transfers to w/c at home; limited to w/c level since surgery.  ADL's / Homemaking Assistance Needed: spouse assisting wtih bathing and dressing        Hand Dominance        Extremity/Trunk Assessment   Upper Extremity Assessment Upper Extremity Assessment: Overall WFL for tasks assessed    Lower Extremity Assessment Lower Extremity Assessment: RLE deficits/detail;LLE deficits/detail RLE Deficits / Details: AROM limited at knee due three prior surgeries after broken knee, then TKA, then infection and I&D. strength hip flexion 3+/5, knee ext 4-/5,ankle DF 3/5 RLE Sensation: WNL LLE Deficits / Details: AROM WFL,  strength hip flexion 4-/5, knee extension 4/5, ankle DF 4+/5       Communication   Communication: No difficulties  Cognition Arousal/Alertness: Awake/alert Behavior During Therapy: WFL for tasks assessed/performed Overall Cognitive Status: Impaired/Different from baseline                                 General Comments: spouse reports she has had word finding trouble since surgery in Jan, but now difficulty reading is new and has other odd behaviors;  feels she is different due to her medications      General Comments General comments (skin integrity, edema, etc.): Spouse in room assisting with history; pt mostly right, but difficulty at times due to inappropriate words    Exercises     Assessment/Plan    PT Assessment Patient needs continued PT services  PT Problem List Decreased strength;Decreased mobility;Decreased safety awareness;Decreased balance;Decreased knowledge of use of DME       PT Treatment Interventions DME instruction;Balance training;Patient/family education;Functional mobility training;Gait training;Therapeutic activities;Therapeutic exercise    PT Goals (Current goals can be found in the Care Plan section)  Acute Rehab PT Goals Patient Stated Goal: to return to walking  PT Goal Formulation: With patient/family Time For Goal Achievement: 06/20/18 Potential to Achieve Goals: Good    Frequency Min 3X/week   Barriers to discharge        Co-evaluation               AM-PAC PT "6 Clicks" Mobility  Outcome Measure Help needed turning from your back to your side while in a flat bed without using bedrails?: A Little Help needed moving from lying on your back to sitting on the side of a flat bed without using bedrails?: A Little Help needed moving to and from a bed to a chair (including a wheelchair)?: A Lot Help needed standing up from a chair using your arms (e.g., wheelchair or bedside chair)?: A Lot Help needed to walk in hospital room?: A Lot Help needed climbing 3-5 steps with a railing? : Total 6 Click Score: 13    End of Session Equipment Utilized During Treatment: Gait belt Activity Tolerance: Patient tolerated treatment well Patient left: in chair;with chair alarm set;with family/visitor present   PT Visit Diagnosis: Other abnormalities of gait and mobility (R26.89);Muscle weakness (generalized) (M62.81)    Time: 8250-0370 PT Time Calculation (min) (ACUTE ONLY): 24 min   Charges:   PT  Evaluation $PT Eval Moderate Complexity: 1 Mod PT Treatments $Therapeutic Activity: 8-22 mins        [name], PT Acute Rehabilitation Services [phone] 06/06/2018   [name] 06/06/2018, 2:19 PM

## 2018-06-06 NOTE — Evaluation (Signed)
Occupational Therapy Evaluation Patient Details Name: Natalie Friedman MRN: [id number] DOB: 1950-09-26 Today's Date: 06/06/2018    History of Present Illness 68 y.o. female who had resection of vestibular schwannoma on 04/16/18 now presents with clear drainage from her wound due to CSF leak s/p lumbar drain 2/25 but came out night of 2/26. 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   Clinical Impression   PTA Pt at home after stay at CIR from initial sx. She was getting HHOT/PT/SLP and assist for ADL from husband. She was doing her own transfers at a wheelchair level. Pt is currently min A with hand held assist for stand pivot transfer, mod A for LB ADL and min A for UB ADL. She demonstrated decreased cognition, balance, and requires increased assistance. She will benefit from skilled OT in the acute setting and post-acute to maximize safety and independence in ADL and functional transfers. Next session to focus on visual assessment? Progressing transfers.  Of Note: when I brought up return to CIR - husband and patient feel very strongly about returning home with [employer] therapies and support. She would be an excellent CIR candidate to progress with safety and function and get to a supervision level - but family requests home.     Follow Up Recommendations  Home health OT;Supervision/Assistance - 24 hour    Equipment Recommendations  3 in 1 bedside commode    Recommendations for Other Services       Precautions / Restrictions Precautions Precautions: Fall Precaution Comments: continued drainage from wound Restrictions Weight Bearing Restrictions: No      Mobility Bed Mobility Overal bed mobility: Needs Assistance Bed Mobility: Supine to Sit     Supine to sit: HOB elevated;Supervision     General bed mobility comments: assist for safety/lines  Transfers Overall transfer level: Needs assistance Equipment  used: 1 person hand held assist Transfers: Stand Pivot Transfers Sit to Stand: Mod assist Stand pivot transfers: Min assist       General transfer comment: min A for power up HHA for balance, and able to take small pivotal steps to chair    Balance Overall balance assessment: Needs assistance Sitting-balance support: Single extremity supported;Feet supported Sitting balance-Leahy Scale: Fair     Standing balance support: Single extremity supported Standing balance-Leahy Scale: Poor Standing balance comment: min A for balance                           ADL either performed or assessed with clinical judgement   ADL Overall ADL's : Needs assistance/impaired Eating/Feeding: Minimal assistance;Sitting   Grooming: Set up;Sitting;Oral care Grooming Details (indicate cue type and reason): to remove pocketed chicken Upper Body Bathing: Minimal assistance   Lower Body Bathing: Moderate assistance   Upper Body Dressing : Min guard   Lower Body Dressing: Maximal assistance;Sit to/from stand   Toilet Transfer: Minimal Production assistant, radio Details (indicate cue type and reason): HHA for small pivotal steps to recliner from bed Toileting- Clothing Manipulation and Hygiene: Minimal assistance;Sit to/from stand       Functional mobility during ADLs: Minimal assistance(SPT only this session) General ADL Comments: attention, balance deficits     Vision Baseline Vision/History: Wears glasses Wears Glasses: At all times Patient Visual Report: Blurring of vision Vision Assessment?: Vision impaired- to be further tested in functional context Additional Comments: attempted - did not have decreased attention and  so unable to complete     Perception     Praxis      Pertinent Vitals/Pain Pain Assessment: No/denies pain     Hand Dominance Right   Extremity/Trunk Assessment Upper Extremity Assessment Upper Extremity Assessment: Overall WFL for tasks  assessed   Lower Extremity Assessment Lower Extremity Assessment: Defer to PT evaluation RLE Deficits / Details: AROM limited at knee due three prior surgeries after broken knee, then TKA, then infection and I&D. strength hip flexion 3+/5, knee ext 4-/5,ankle DF 3/5 RLE Sensation: WNL LLE Deficits / Details: AROM WFL, strength hip flexion 4-/5, knee extension 4/5, ankle DF 4+/5       Communication Communication Communication: Expressive difficulties(word finding)   Cognition Arousal/Alertness: Awake/alert Behavior During Therapy: WFL for tasks assessed/performed Overall Cognitive Status: Impaired/Different from baseline Area of Impairment: Attention;Awareness;Problem solving;Memory                   Current Attention Level: Focused Memory: Decreased short-term memory     Awareness: Emergent Problem Solving: Slow processing;Requires verbal cues General Comments: Pt was unable to attend for visual assessment, she would talk about subjects that were not related halfway through a topic, Spouse tells me that "she gets like this at the hospital due to medications"   General Comments  At end of session, Pt and spouse shared with me that they do not want to return to CIR - she shared that she felt "trapped" there all the time and they were not happy with MD care there - she was lethargic throughout. OT expressed sympathy and explained importance of therapy for physical and cognitive standpoint. They were working at home with all 3 therapies and would like to return back to that venue    Exercises     Shoulder Jerome expects to be discharged to:: Private residence Living Arrangements: Spouse/significant other Available Help at Discharge: Family;Available 24 hours/day Type of Home: House Home Access: Stairs to enter;Ramped entrance Entrance Stairs-Number of Steps: 2 Entrance Stairs-Rails: None Home Layout: Two level;Able to live on main  level with bedroom/bathroom     Bathroom Shower/Tub: Walk-in shower;Door   [employer] Toilet: Standard Bathroom Accessibility: Yes How Accessible: Accessible via walker Home Equipment: Cane - single point;Walker - 2 wheels;Shower seat;Wheelchair - manual          Prior Functioning/Environment Level of Independence: Needs assistance  Gait / Transfers Assistance Needed: spouse assisting with transfers to w/c at home; limited to w/c level since surgery. ADL's / Homemaking Assistance Needed: spouse assisting wtih bathing and dressing            OT Problem List: Decreased activity tolerance;Impaired balance (sitting and/or standing);Decreased cognition;Decreased safety awareness      OT Treatment/Interventions: Self-care/ADL training;DME and/or AE instruction;Therapeutic activities;Cognitive remediation/compensation;Visual/perceptual remediation/compensation;Patient/family education;Balance training    OT Goals(Current goals can be found in the care plan section) Acute Rehab OT Goals Patient Stated Goal: "go home" OT Goal Formulation: With patient/family Time For Goal Achievement: 06/20/18 Potential to Achieve Goals: Good ADL Goals Pt Will Perform Grooming: with modified independence;sitting Pt Will Perform Upper Body Dressing: with modified independence;sitting Pt Will Perform Lower Body Dressing: with min assist;with caregiver independent in assisting;sit to/from stand Pt Will Transfer to Toilet: with supervision;stand pivot transfer Pt Will Perform Toileting - Clothing Manipulation and hygiene: with supervision;sit to/from stand Additional ADL Goal #1: Pt will perform bed mobility at mod I level prior to engaging in ADL  OT Frequency:  Min 3X/week   Barriers to D/C:            Co-evaluation              AM-PAC OT "6 Clicks" Daily Activity     Outcome Measure Help from another person eating meals?: A Little Help from another person taking care of personal grooming?:  A Little Help from another person toileting, which includes using toliet, bedpan, or urinal?: A Lot Help from another person bathing (including washing, rinsing, drying)?: A Lot Help from another person to put on and taking off regular upper body clothing?: A Little Help from another person to put on and taking off regular lower body clothing?: A Lot 6 Click Score: 15   End of Session Equipment Utilized During Treatment: Gait belt Nurse Communication: Mobility status  Activity Tolerance: Patient tolerated treatment well Patient left: in chair;with call bell/phone within reach;with chair alarm set;with family/visitor present  OT Visit Diagnosis: Unsteadiness on feet (R26.81);Other abnormalities of gait and mobility (R26.89);Muscle weakness (generalized) (M62.81);Low vision, both eyes (H54.2);Other symptoms and signs involving cognitive function                Time: 9357-0177 OT Time Calculation (min): 39 min Charges:  OT General Charges $OT Visit: 1 Visit OT Evaluation $OT Eval Moderate Complexity: 1 Mod OT Treatments $Self Care/Home Management : 8-22 mins  Hulda Humphrey OTR/L Acute Rehabilitation Services Pager: [phone] Office: [place] 06/06/2018, 6:08 PM

## 2018-06-06 NOTE — Progress Notes (Addendum)
Rehab Admissions Coordinator Note:  Per PT recommendation, this patient was screened by Jhonnie Garner for appropriateness for an Inpatient Acute Rehab Consult.  At this time, we are recommending an Inpatient Rehab consult. AC will contact MD to request an IP Rehab Consult Order.   Jhonnie Garner 06/06/2018, 2:52 PM  I can be reached at [phone].  ADDENDUM 3:06PM: spoke with Dr. Zada Finders regarding consult order. The pt may be returning to the OR Monday; AC will follow at a distance and will request order as appropriate.

## 2018-06-07 LAB — BASIC METABOLIC PANEL
Anion gap: 10 (ref 5–15)
BUN: 51 mg/dL — ABNORMAL HIGH (ref 8–23)
CALCIUM: 9.8 mg/dL (ref 8.9–10.3)
CO2: 23 mmol/L (ref 22–32)
Chloride: 104 mmol/L (ref 98–111)
Creatinine, Ser: 2.75 mg/dL — ABNORMAL HIGH (ref 0.44–1.00)
GFR calc Af Amer: 19 mL/min — ABNORMAL LOW (ref >60)
GFR calc non Af Amer: 17 mL/min — ABNORMAL LOW (ref >60)
Glucose, Bld: 96 mg/dL (ref 70–99)
Potassium: 5.2 mmol/L — ABNORMAL HIGH (ref 3.5–5.1)
Sodium: 137 mmol/L (ref 135–145)

## 2018-06-07 LAB — CBC
HCT: 35.6 % — ABNORMAL LOW (ref 36.0–46.0)
Hemoglobin: 11.6 g/dL — ABNORMAL LOW (ref 12.0–15.0)
MCH: 30.3 pg (ref 26.0–34.0)
MCHC: 32.6 g/dL (ref 30.0–36.0)
MCV: 93 fL (ref 80.0–100.0)
Platelets: 124 10*3/uL — ABNORMAL LOW (ref 150–400)
RBC: 3.83 MIL/uL — ABNORMAL LOW (ref 3.87–5.11)
RDW: 11.2 % — ABNORMAL LOW (ref 11.5–15.5)
WBC: 6.2 10*3/uL (ref 4.0–10.5)
nRBC: 0 % (ref 0.0–0.2)

## 2018-06-07 LAB — GLUCOSE, CAPILLARY
GLUCOSE-CAPILLARY: 115 mg/dL — AB (ref 70–99)
Glucose-Capillary: 97 mg/dL (ref 70–99)
Glucose-Capillary: 154 mg/dL — ABNORMAL HIGH (ref 70–99)

## 2018-06-07 NOTE — Progress Notes (Signed)
Neurosurgery Service Progress Note  Subjective: No acute events overnight, pseudomeningocele returned but no drainage from wound since LD came out  Objective: Vitals:   06/07/18 1100 06/07/18 1300 06/07/18 1400 06/07/18 1500  BP: (!) 118/93 137/80 128/80 137/73  Pulse: 78 71 71 68  Resp: 19 19 (!) 21 (!) 21  Temp: 98.8 F (37.1 C)   99.5 F (37.5 C)  TempSrc: Oral   Oral  SpO2: 100% 98% 98% 99%  Weight:      Height:       Temp (24hrs), Avg:99.1 F (37.3 C), Min:98.4 F (36.9 C), Max:100 F (37.8 C)  CBC Latest Ref Rng & Units 06/07/2018 06/03/2018 05/08/2018  WBC 4.0 - 10.5 K/uL 6.2 7.9 6.2  Hemoglobin 12.0 - 15.0 g/dL 11.6(L) 11.4(L) 10.9(L)  Hematocrit 36.0 - 46.0 % 35.6(L) 34.2(L) 33.9(L)  Platelets 150 - 400 K/uL 124(L) 159 186   BMP Latest Ref Rng & Units 06/07/2018 06/03/2018 05/10/2018  Glucose 70 - 99 mg/dL 96 124(H) 103(H)  BUN 8 - 23 mg/dL 51(H) 43(H) 53(H)  Creatinine 0.44 - 1.00 mg/dL 2.75(H) 2.93(H) 4.01(H)  Sodium 135 - 145 mmol/L 137 131(L) 135  Potassium 3.5 - 5.1 mmol/L 5.2(H) 4.9 4.8  Chloride 98 - 111 mmol/L 104 96(L) 88(L)  CO2 22 - 32 mmol/L 23 26 35(H)  Calcium 8.9 - 10.3 mg/dL 9.8 9.6 9.8    Intake/Output Summary (Last 24 hours) at 06/07/2018 1630 Last data filed at 06/07/2018 1415 Gross per 24 hour  Intake 400 ml  Output 1750 ml  Net -1350 ml    Current Facility-Administered Medications:  .  acetaminophen (TYLENOL) tablet 650 mg, 650 mg, Oral, Q4H PRN **OR** acetaminophen (TYLENOL) suppository 650 mg, 650 mg, Rectal, Q4H PRN, Kileen Lange A, MD .  amLODipine (NORVASC) tablet 5 mg, 5 mg, Oral, QPM, Donette Mainwaring, Joyice Faster, MD, 5 mg at 06/06/18 1826 .  carvedilol (COREG) tablet 12.5 mg, 12.5 mg, Oral, BID WC, Lasonya Hubner A, MD, 12.5 mg at 06/07/18 0913 .  Chlorhexidine Gluconate Cloth 2 % PADS 6 each, 6 each, Topical, Q0600, Judith Part, MD, 6 each at 06/07/18 [phone] .  cholecalciferol (VITAMIN D3) tablet 4,000 Units, 4,000 Units, Oral,  Daily, Judith Part, MD, 4,000 Units at 06/07/18 0913 .  docusate sodium (COLACE) capsule 100 mg, 100 mg, Oral, BID, Judith Part, MD, 100 mg at 06/07/18 0913 .  famotidine (PEPCID) tablet 20 mg, 20 mg, Oral, Daily, Rumbarger, Valeda Malm, RPH, 20 mg at 06/07/18 0913 .  glimepiride (AMARYL) tablet 1 mg, 1 mg, Oral, Q breakfast, Danielle Lento A, MD, 1 mg at 06/07/18 0917 .  heparin injection 5,000 Units, 5,000 Units, Subcutaneous, Q8H, Judith Part, MD, 5,000 Units at 06/07/18 1419 .  HYDROcodone-acetaminophen (NORCO/VICODIN) 5-325 MG per tablet 1 tablet, 1 tablet, Oral, Q4H PRN, Judith Part, MD, 1 tablet at 06/07/18 0449 .  HYDROmorphone (DILAUDID) injection 0.5 mg, 0.5 mg, Intravenous, Q3H PRN, Judith Part, MD, 0.5 mg at 06/05/18 0840 .  insulin aspart (novoLOG) injection 0-15 Units, 0-15 Units, Subcutaneous, TID WC, Laneah Luft, Joyice Faster, MD, 3 Units at 06/07/18 1304 .  irbesartan (AVAPRO) tablet 150 mg, 150 mg, Oral, Daily, Judith Part, MD, 150 mg at 06/07/18 0913 .  labetalol (NORMODYNE,TRANDATE) injection 10-40 mg, 10-40 mg, Intravenous, Q10 min PRN, Judith Part, MD, 20 mg at 06/06/18 0830 .  naphazoline-glycerin (CLEAR EYES REDNESS) ophth solution 1 drop, 1 drop, Right Eye, QID, Lahoma Constantin, Joyice Faster, MD, 1 drop  at 06/07/18 1419 .  ondansetron (ZOFRAN) tablet 4 mg, 4 mg, Oral, Q4H PRN **OR** ondansetron (ZOFRAN) injection 4 mg, 4 mg, Intravenous, Q4H PRN, Judith Part, MD, 4 mg at 06/07/18 0452 .  polyethylene glycol (MIRALAX / GLYCOLAX) packet 17 g, 17 g, Oral, Daily PRN, Judith Part, MD .  promethazine (PHENERGAN) tablet 12.5-25 mg, 12.5-25 mg, Oral, Q4H PRN, Judith Part, MD, 25 mg at 06/05/18 1245 .  vitamin B-12 (CYANOCOBALAMIN) tablet 500 mcg, 500 mcg, Oral, Daily, Judith Part, MD, 500 mcg at 06/07/18 7972   Physical Exam: AOx1, PERRL, EOMI, +R LMN HB3, Strength 5/5 x4, SILTx4, no drift Pseudomeningocele  present again, no active drainage with palpation  Assessment & Plan: 68 y.o. 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 2/24 CTH no ventriculomegaly, 2/25 LD placement, 2/26 LD came out ~20:00, 2/27 pseudomeningocele recurred  -continue to monitor if she leaks again. If she leaks over the weekend, will plan on placing a shunt next week. Otherwise, will discharge early next week -SCDs/TEDs, SQH  Judith Part  06/07/18 4:30 PM

## 2018-06-08 ENCOUNTER — Inpatient Hospital Stay (HOSPITAL_COMMUNITY): Payer: Medicare Other

## 2018-06-08 LAB — GLUCOSE, CAPILLARY
GLUCOSE-CAPILLARY: 133 mg/dL — AB (ref 70–99)
Glucose-Capillary: 90 mg/dL (ref 70–99)
Glucose-Capillary: 118 mg/dL — ABNORMAL HIGH (ref 70–99)
Glucose-Capillary: 118 mg/dL — ABNORMAL HIGH (ref 70–99)

## 2018-06-08 MED ORDER — ACETAZOLAMIDE 250 MG PO TABS
250.0000 mg | ORAL_TABLET | Freq: Three times a day (TID) | ORAL | Status: DC
  Administered 2018-06-08 – 2018-06-09 (×4): 250 mg via ORAL
  Filled 2018-06-08 (×5): qty 1

## 2018-06-08 MED ORDER — SODIUM CHLORIDE 0.9 % IV SOLN
2.0000 g | Freq: Every day | INTRAVENOUS | Status: DC
  Administered 2018-06-08 – 2018-06-10 (×3): 2 g via INTRAVENOUS
  Filled 2018-06-08 (×3): qty 20

## 2018-06-08 MED ORDER — CEFTRIAXONE SODIUM 1 G IJ SOLR: 1.0000 g | INTRAMUSCULAR | Status: DC

## 2018-06-08 NOTE — Evaluation (Signed)
Clinical/Bedside Swallow Evaluation Patient Details  Name: Natalie Friedman MRN: [id number] Date of Birth: 19-Aug-1950  Today's Date: 06/08/2018 Time: SLP Start Time (ACUTE ONLY): 1655 SLP Stop Time (ACUTE ONLY): 1712 SLP Time Calculation (min) (ACUTE ONLY): 17 min  Past Medical History:  Past Medical History:  Diagnosis Date  . Anxiety   . Arthritis   . CHF (congestive heart failure) ([place])   . Chronic kidney disease   . Depression   . Diabetes mellitus without complication ([place])   . Hypertension   . Neuropathy   . Occult spina bifida    Past Surgical History:  Past Surgical History:  Procedure Laterality Date  . APPLICATION OF CRANIAL NAVIGATION N/A 04/16/2018   Procedure: APPLICATION OF CRANIAL NAVIGATION;  Surgeon: Judith Part, MD;  Location: [facility];  Service: Neurosurgery;  Laterality: N/A;  . CRANIOTOMY Right 04/16/2018   Procedure: Right craniotomy for tumor resection with brainlab;  Surgeon: Judith Part, MD;  Location: [facility];  Service: Neurosurgery;  Laterality: Right;  right  . ectopic    . INCISION AND DRAINAGE Right 10/04/2016   Procedure: INCISION AND DRAINAGE AND ASPIRATION;  Surgeon: Carole Civil, MD;  Location: [facility];  Service: Orthopedics;  Laterality: Right;  right knee wound  . patella fx Right 07/2015  . TOTAL KNEE ARTHROPLASTY Right 07/20/2016   Procedure: TOTAL KNEE ARTHROPLASTY;  Surgeon: Carole Civil, MD;  Location: [facility];  Service: Orthopedics;  Laterality: Right;   HPI:  68 y.o. female who had resection of vestibular schwannoma on 04/16/18 now presents with clear drainage from her wound due to CSF leak s/p lumbar drain 2/25 but came out night of 2/26. 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 Pt seen by SLP during acute stay and then CIR, was consuming Dys 2, thin liquids at time of d/c on 05/10/18. Per RN pt noted to pocket food.   Assessment / Plan /  Recommendation Clinical Impression  Pt appears to have adequate airway protection with thin liquids, soft and regular solids, though she is at risk for aspiration due to oral weakness and decreased awareness, which impact her ability to full masticate and clear solid foods. SLP observed pt self-feeding items from dinner tray (regular/thin). No overt signs of aspiration, however pt consumed large bites, with copious anterior spillage, unaware of spaghetti strands dangling several inches out of the right side of her mouth. Significant oral residue, and pt continued to feed large sequential bites. SLP cut foods to chopped consistency, which decreased anterior loss somewhat, though pt required ongoing cues to place fork down and clear residue prior to taking additional bites. SLP educated pt re: aspiration risks, precautions, and recommendations for dysphagia 2 (chopped) diet with thin liquids. Pt adamantly refused, stating, "Don't put that in." Pt agreed to allow RN or her husband to chop foods when tray arrives. SLP discussed recommendations with pt's husband as well, and demonstrated cuing pt to place fork down, clear oral cavity between bites. SLP to follow briefly for education and training in compensatory strategies.    SLP Visit Diagnosis: Dysphagia, oral phase (R13.11)    Aspiration Risk  Mild aspiration risk;Moderate aspiration risk    Diet Recommendation Dysphagia 2 (Fine chop);Thin liquid;Other (Comment)(pt declines this recommendation; RN/husband to chop foods)   Liquid Administration via: Cup;Straw Medication Administration: Whole meds with liquid Supervision: Patient able to self feed;Intermittent supervision to cue for compensatory strategies  Compensations: Slow rate;Small sips/bites;Lingual sweep for clearance of pocketing;Other (Comment)(fork down between bites) Postural Changes: Seated upright at 90 degrees    Other  Recommendations Oral Care Recommendations: Oral care BID   Follow  up Recommendations Home health SLP      Frequency and Duration min 1 x/week  1 week       Prognosis Prognosis for Safe Diet Advancement: Fair Barriers to Reach Goals: Cognitive deficits(poor awareness )      Swallow Study   General Date of Onset: 06/03/18 HPI: 68 y.o. female who had resection of vestibular schwannoma on 04/16/18 now presents with clear drainage from her wound due to CSF leak s/p lumbar drain 2/25 but came out night of 2/26. 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 Pt seen by SLP during acute stay and then CIR, was consuming Dys 2, thin liquids at time of d/c on 05/10/18. Per RN pt noted to pocket food. Type of Study: Bedside Swallow Evaluation Previous Swallow Assessment: see HPI Diet Prior to this Study: Regular;Thin liquids Temperature Spikes Noted: No Respiratory Status: Room air History of Recent Intubation: No Behavior/Cognition: Alert;Cooperative;Distractible;Confused Oral Cavity Assessment: Within Functional Limits Oral Care Completed by SLP: No Oral Cavity - Dentition: Missing dentition;Poor condition Vision: Functional for self-feeding Self-Feeding Abilities: Able to feed self Patient Positioning: Upright in chair Baseline Vocal Quality: Normal Volitional Cough: Strong Volitional Swallow: Able to elicit    Oral/Motor/Sensory Function Overall Oral Motor/Sensory Function: Moderate impairment Facial ROM: Reduced right;Suspected CN VII (facial) dysfunction Facial Symmetry: Abnormal symmetry right;Suspected CN VII (facial) dysfunction Facial Strength: Reduced right;Suspected CN VII (facial) dysfunction Facial Sensation: Within Functional Limits(pt reports improving) Lingual ROM: Within Functional Limits Lingual Symmetry: Within Functional Limits   Ice Chips Ice chips: Not tested   Thin Liquid Thin Liquid: Within functional limits Presentation: Straw    Nectar Thick Nectar  Thick Liquid: Not tested   Honey Thick Honey Thick Liquid: Not tested   Puree Puree: Not tested   Solid     Solid: Impaired Presentation: Self Fed;Spoon Oral Phase Impairments: Reduced labial seal;Poor awareness of bolus;Impaired mastication Oral Phase Functional Implications: Right anterior spillage;Impaired mastication;Oral residue     Deneise Lever, [place], CCC-SLP Speech-Language Pathologist Acute Rehabilitation Services Pager: [phone] Office: [phone]  Aliene Altes 06/08/2018,5:19 PM

## 2018-06-08 NOTE — Progress Notes (Signed)
SLP Cancellation Note  Patient Details Name: Natalie Friedman MRN: [id number] DOB: [birth year]   Cancelled treatment:       Reason Eval/Treat Not Completed: Other (comment). Pt initially declined testing, agreed to participate with some encouragement but then accepted 2 phone calls before SLP was able to assess. SLP waited several minutes but pt continued to talk on the phone. SLP informed pt will return at a later time. Per chart review pt was on dysphagia 2 diet with thin liquids at time of d/c from CIR. RN may downgrade to Dysphagia 2, thin liquids if pocketing noted. Will reattempt as schedule allows.  Deneise Lever, [place], CCC-SLP Speech-Language Pathologist Acute Rehabilitation Services Pager: [phone] Office: [phone]    Aliene Altes 06/08/2018, 11:38 AM

## 2018-06-08 NOTE — Progress Notes (Signed)
Upon return from CT, pt stated pain improving, denied needing any medication. More alert and able to state name, place and year.

## 2018-06-08 NOTE — Progress Notes (Signed)
Patient ID: Natalie Friedman, female   DOB: Sep 01, 1950, 68 y.o.   MRN: [id number] Patient is at her baseline she was neurologically nonfocal intermittently somnolent complaining of headache  Patient had drainage from her incision sites are in the bed this morning had a CT scan that did not show hydrocephalus or compression however did show fluid collection. Patient currently is on schedule for shunt on Monday we'll continue to try to ride this I will place her on antibiotics prophylactically and Diamox to decrease the production of CSF.

## 2018-06-08 NOTE — Progress Notes (Signed)
Patient's bed found wet under head. Pillow and large area under pillow was saturated with clear, odorless fluid. The sheets had been dry 15 minutes prior.  Patient was restless and complaining of severe headache. Margo Aye, NP paged and ordered a STAT CT Head. If needed further, instructed to page Dr. Saintclair Halsted, as it was a mistake that Maudie Mercury was listed on call. Appreciated her help.

## 2018-06-09 LAB — GLUCOSE, CAPILLARY
Glucose-Capillary: 111 mg/dL — ABNORMAL HIGH (ref 70–99)
Glucose-Capillary: 150 mg/dL — ABNORMAL HIGH (ref 70–99)
Glucose-Capillary: 132 mg/dL — ABNORMAL HIGH (ref 70–99)

## 2018-06-09 NOTE — Progress Notes (Signed)
Subjective: Patient reports patient doing better this morning with less headache no episodes of drainage from her incision since yesterday morning  Objective: Vital signs in last 24 hours: Temp:  [98.2 F (36.8 C)-99.2 F (37.3 C)] 98.6 F (37 C) (03/01 0400) Pulse Rate:  [48-102] 84 (03/01 0600) Resp:  [16-28] 22 (03/01 0600) BP: (99-160)/(42-103) 160/86 (03/01 0600) SpO2:  [91 %-100 %] 97 % (03/01 0600)  Intake/Output from previous day: 02/29 0701 - 03/01 0700 In: 67 [P.O.:390; IV Piggyback:100] Out: 1875 [Urine:1875] Intake/Output this shift: No intake/output data recorded.  awake and alert slight right seventh nerve palsy otherwise nonfocal incision clean dry and intact swelling seems to be less  Lab Results: Recent Labs    06/07/18 0520  WBC 6.2  HGB 11.6*  HCT 35.6*  PLT 124*   BMET Recent Labs    06/07/18 0520  NA 137  K 5.2*  CL 104  CO2 23  GLUCOSE 96  BUN 51*  CREATININE 2.75*  CALCIUM 9.8    Studies/Results: Ct Head Wo Contrast  Result Date: 06/08/2018 CLINICAL DATA:  Initial evaluation for acute severe headache. EXAM: CT HEAD WITHOUT CONTRAST TECHNIQUE: Contiguous axial images were obtained from the base of the skull through the vertex without intravenous contrast. COMPARISON:  Prior CT from 06/03/2018. FINDINGS: Brain: Postsurgical changes from prior right retrosigmoid craniotomy for vestibular schwannoma resection again seen. Continued interval evolution of subacute postsurgical/ischemic changes at the right middle cerebellar peduncle. Adjacent fourth ventricle remains widely patent. Stable ventricular size without hydrocephalus. No acute intracranial hemorrhage. No acute large vessel territory infarct. Underlying atrophy with advanced chronic microvascular ischemic disease noted, stable. Remote lacunar infarcts at the right basal ganglia and left thalamus. No other extra-axial fluid collection. Vascular: No hyperdense vessel. Calcified atherosclerosis  present at the skull base. Skull: Sequelae of prior retrosigmoid craniotomy with cranioplasty. Overlying pseudomeningocele relatively similar in size measuring 5.0 x 2.1 cm. Sinuses/Orbits: Globes and orbital soft tissues within normal limits. 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 Sequelae of chronic maxillary sinusitis noted without evidence for acute sinus exacerbates shin. Small right mastoid effusion noted Other: None. IMPRESSION: 1. Postoperative changes from previous right retrosigmoid craniotomy with cranioplasty for vestibular schwannoma resection. Overlying pseudomeningocele relatively similar measuring 5.0 x 2.1 cm on today's exam. 2. Evolving hypodensity at the right middle cerebellar peduncle, likely evolving subacute ischemia, similar to previous. 3. No other new acute intracranial abnormality. Stable ventricular size without hydrocephalus. Electronically Signed   By: Jeannine Boga M.D.   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  LOS: 6 days     Benjimin Hadden P 06/09/2018, 7:59 AM

## 2018-06-10 ENCOUNTER — Inpatient Hospital Stay (HOSPITAL_COMMUNITY): Payer: Medicare Other

## 2018-06-10 ENCOUNTER — Inpatient Hospital Stay (HOSPITAL_COMMUNITY): Payer: Medicare Other | Admitting: Certified Registered"

## 2018-06-10 ENCOUNTER — Encounter (HOSPITAL_COMMUNITY)
Admission: EM | Disposition: A | Discharge status: Home Health | Payer: Self-pay | Source: Home / Self Care | Attending: Neurological Surgery

## 2018-06-10 ENCOUNTER — Encounter (HOSPITAL_COMMUNITY): Payer: Self-pay | Admitting: Registered Nurse

## 2018-06-10 HISTORY — PX: VENTRICULOPERITONEAL SHUNT: SHX204

## 2018-06-10 LAB — GLUCOSE, CAPILLARY
GLUCOSE-CAPILLARY: 98 mg/dL (ref 70–99)
Glucose-Capillary: 91 mg/dL (ref 70–99)
Glucose-Capillary: 95 mg/dL (ref 70–99)
Glucose-Capillary: 82 mg/dL (ref 70–99)
Glucose-Capillary: 107 mg/dL — ABNORMAL HIGH (ref 70–99)

## 2018-06-10 SURGERY — SHUNT INSERTION VENTRICULAR-PERITONEAL
Anesthesia: General | Site: Head | Laterality: Left

## 2018-06-10 MED ORDER — LABETALOL HCL 5 MG/ML IV SOLN
INTRAVENOUS | Status: DC | PRN
  Administered 2018-06-10: 2.5 mg via INTRAVENOUS

## 2018-06-10 MED ORDER — ROCURONIUM BROMIDE 10 MG/ML (PF) SYRINGE
PREFILLED_SYRINGE | INTRAVENOUS | Status: DC | PRN
  Administered 2018-06-10: 20 mg via INTRAVENOUS
  Administered 2018-06-10: 50 mg via INTRAVENOUS
  Administered 2018-06-10 (×3): 10 mg via INTRAVENOUS

## 2018-06-10 MED ORDER — SUGAMMADEX SODIUM 500 MG/5ML IV SOLN
INTRAVENOUS | Status: AC
  Filled 2018-06-10: qty 10

## 2018-06-10 MED ORDER — FENTANYL CITRATE (PF) 250 MCG/5ML IJ SOLN
INTRAMUSCULAR | Status: AC
  Filled 2018-06-10: qty 5

## 2018-06-10 MED ORDER — ONDANSETRON HCL 4 MG/2ML IJ SOLN
INTRAMUSCULAR | Status: AC
  Filled 2018-06-10: qty 2

## 2018-06-10 MED ORDER — THROMBIN 5000 UNITS EX SOLR
CUTANEOUS | Status: AC
  Filled 2018-06-10: qty 5000

## 2018-06-10 MED ORDER — LIDOCAINE 2% (20 MG/ML) 5 ML SYRINGE
INTRAMUSCULAR | Status: DC | PRN
  Administered 2018-06-10: 60 mg via INTRAVENOUS

## 2018-06-10 MED ORDER — PROPOFOL 10 MG/ML IV BOLUS
INTRAVENOUS | Status: AC
  Filled 2018-06-10: qty 20

## 2018-06-10 MED ORDER — LABETALOL HCL 5 MG/ML IV SOLN
INTRAVENOUS | Status: AC
  Filled 2018-06-10: qty 4

## 2018-06-10 MED ORDER — ROCURONIUM BROMIDE 50 MG/5ML IV SOSY
PREFILLED_SYRINGE | INTRAVENOUS | Status: AC
  Filled 2018-06-10: qty 5

## 2018-06-10 MED ORDER — PROMETHAZINE HCL 25 MG/ML IJ SOLN
INTRAMUSCULAR | Status: AC
  Administered 2018-06-10: 6.25 mg via INTRAVENOUS
  Filled 2018-06-10: qty 1

## 2018-06-10 MED ORDER — EPHEDRINE 5 MG/ML INJ
INTRAVENOUS | Status: AC
  Filled 2018-06-10: qty 10

## 2018-06-10 MED ORDER — ESMOLOL HCL 100 MG/10ML IV SOLN
INTRAVENOUS | Status: AC
  Filled 2018-06-10: qty 10

## 2018-06-10 MED ORDER — PHENYLEPHRINE 40 MCG/ML (10ML) SYRINGE FOR IV PUSH (FOR BLOOD PRESSURE SUPPORT)
PREFILLED_SYRINGE | INTRAVENOUS | Status: DC | PRN
  Administered 2018-06-10 (×4): 80 ug via INTRAVENOUS

## 2018-06-10 MED ORDER — DEXAMETHASONE SODIUM PHOSPHATE 10 MG/ML IJ SOLN
INTRAMUSCULAR | Status: AC
  Filled 2018-06-10: qty 1

## 2018-06-10 MED ORDER — PHENYLEPHRINE 40 MCG/ML (10ML) SYRINGE FOR IV PUSH (FOR BLOOD PRESSURE SUPPORT)
PREFILLED_SYRINGE | INTRAVENOUS | Status: AC
  Filled 2018-06-10: qty 10

## 2018-06-10 MED ORDER — CEFAZOLIN SODIUM-DEXTROSE 1-4 GM/50ML-% IV SOLN
INTRAVENOUS | Status: DC | PRN
  Administered 2018-06-10: 1 g via INTRAVENOUS

## 2018-06-10 MED ORDER — HYDROMORPHONE HCL 1 MG/ML IJ SOLN
INTRAMUSCULAR | Status: AC
  Administered 2018-06-10: 0.5 mg via INTRAVENOUS
  Filled 2018-06-10: qty 1

## 2018-06-10 MED ORDER — SODIUM CHLORIDE 0.9 % IV SOLN
INTRAVENOUS | Status: DC | PRN
  Administered 2018-06-10: 16:00:00 via INTRAVENOUS

## 2018-06-10 MED ORDER — LIDOCAINE-EPINEPHRINE 1 %-1:100000 IJ SOLN
INTRAMUSCULAR | Status: DC | PRN
  Administered 2018-06-10: 11 mL

## 2018-06-10 MED ORDER — ESMOLOL HCL 100 MG/10ML IV SOLN: INTRAVENOUS | Status: DC | PRN

## 2018-06-10 MED ORDER — ESMOLOL HCL 100 MG/10ML IV SOLN
INTRAVENOUS | Status: DC | PRN
  Administered 2018-06-10 (×5): 10 mg via INTRAVENOUS

## 2018-06-10 MED ORDER — CEFAZOLIN SODIUM 1 G IJ SOLR
INTRAMUSCULAR | Status: AC
  Filled 2018-06-10: qty 10

## 2018-06-10 MED ORDER — THROMBIN 5000 UNITS EX SOLR
OROMUCOSAL | Status: DC | PRN
  Administered 2018-06-10: 18:00:00 via TOPICAL

## 2018-06-10 MED ORDER — LIDOCAINE 2% (20 MG/ML) 5 ML SYRINGE
INTRAMUSCULAR | Status: AC
  Filled 2018-06-10: qty 5

## 2018-06-10 MED ORDER — BACITRACIN ZINC 500 UNIT/GM EX OINT
TOPICAL_OINTMENT | CUTANEOUS | Status: DC | PRN
  Administered 2018-06-10: 1 via TOPICAL

## 2018-06-10 MED ORDER — ONDANSETRON HCL 4 MG/2ML IJ SOLN
INTRAMUSCULAR | Status: DC | PRN
  Administered 2018-06-10: 4 mg via INTRAVENOUS

## 2018-06-10 MED ORDER — FENTANYL CITRATE (PF) 250 MCG/5ML IJ SOLN
INTRAMUSCULAR | Status: DC | PRN
  Administered 2018-06-10 (×2): 25 ug via INTRAVENOUS
  Administered 2018-06-10: 50 ug via INTRAVENOUS
  Administered 2018-06-10: 25 ug via INTRAVENOUS
  Administered 2018-06-10: 50 ug via INTRAVENOUS
  Administered 2018-06-10: 25 ug via INTRAVENOUS

## 2018-06-10 MED ORDER — SUGAMMADEX SODIUM 200 MG/2ML IV SOLN
INTRAVENOUS | Status: DC | PRN
  Administered 2018-06-10: 200 mg via INTRAVENOUS

## 2018-06-10 MED ORDER — BACITRACIN ZINC 500 UNIT/GM EX OINT
TOPICAL_OINTMENT | CUTANEOUS | Status: AC
  Filled 2018-06-10: qty 28.35

## 2018-06-10 MED ORDER — SODIUM CHLORIDE 0.9 % IV SOLN
INTRAVENOUS | Status: DC
  Administered 2018-06-10 (×2): via INTRAVENOUS

## 2018-06-10 MED ORDER — LIDOCAINE-EPINEPHRINE 1 %-1:100000 IJ SOLN
INTRAMUSCULAR | Status: AC
  Filled 2018-06-10: qty 1

## 2018-06-10 MED ORDER — PROMETHAZINE HCL 25 MG/ML IJ SOLN
6.2500 mg | Freq: Once | INTRAMUSCULAR | Status: AC
  Administered 2018-06-10: 6.25 mg via INTRAVENOUS

## 2018-06-10 MED ORDER — 0.9 % SODIUM CHLORIDE (POUR BTL) OPTIME
TOPICAL | Status: DC | PRN
  Administered 2018-06-10: 1000 mL

## 2018-06-10 MED ORDER — ACETAMINOPHEN 500 MG PO TABS
1000.0000 mg | ORAL_TABLET | Freq: Once | ORAL | Status: AC
  Administered 2018-06-12: 1000 mg via ORAL
  Filled 2018-06-10: qty 2

## 2018-06-10 MED ORDER — SODIUM CHLORIDE 0.9 % IV SOLN
INTRAVENOUS | Status: DC | PRN
  Administered 2018-06-10: 15 ug/min via INTRAVENOUS

## 2018-06-10 MED ORDER — PROPOFOL 10 MG/ML IV BOLUS
INTRAVENOUS | Status: DC | PRN
  Administered 2018-06-10: 80 mg via INTRAVENOUS

## 2018-06-10 SURGICAL SUPPLY — 56 items
BLADE CLIPPER SURG (BLADE) ×3 IMPLANT
BLADE SURG 11 STRL SS (BLADE) ×3 IMPLANT
BUR ACORN 9.0 PRECISION (BURR) ×2 IMPLANT
BUR ACORN 9.0MM PRECISION (BURR) ×1
CANISTER SUCT 3000ML PPV (MISCELLANEOUS) ×3 IMPLANT
COVER WAND RF STERILE (DRAPES) ×3 IMPLANT
DERMABOND ADVANCED (GAUZE/BANDAGES/DRESSINGS) ×2
DERMABOND ADVANCED .7 DNX12 (GAUZE/BANDAGES/DRESSINGS) ×1 IMPLANT
DRAPE HALF SHEET 40X57 (DRAPES) ×3 IMPLANT
DRAPE INCISE IOBAN 66X45 STRL (DRAPES) ×3 IMPLANT
DRAPE ORTHO SPLIT 77X108 STRL (DRAPES) ×4
DRAPE POUCH INSTRU U-SHP 10X18 (DRAPES) ×3 IMPLANT
DRAPE SURG ORHT 6 SPLT 77X108 (DRAPES) ×2 IMPLANT
DURAPREP 26ML APPLICATOR (WOUND CARE) ×6 IMPLANT
ELECT REM PT RETURN 9FT ADLT (ELECTROSURGICAL) ×3
ELECTRODE REM PT RTRN 9FT ADLT (ELECTROSURGICAL) ×1 IMPLANT
GAUZE 4X4 16PLY RFD (DISPOSABLE) IMPLANT
GLOVE BIO SURGEON STRL SZ7.5 (GLOVE) ×6 IMPLANT
GLOVE BIOGEL PI IND STRL 7.0 (GLOVE) ×2 IMPLANT
GLOVE BIOGEL PI IND STRL 7.5 (GLOVE) ×2 IMPLANT
GLOVE BIOGEL PI INDICATOR 7.0 (GLOVE) ×4
GLOVE BIOGEL PI INDICATOR 7.5 (GLOVE) ×4
GLOVE ECLIPSE 9.0 STRL (GLOVE) ×3 IMPLANT
GLOVE EXAM NITRILE XL STR (GLOVE) IMPLANT
GLOVE INDICATOR 7.5 STRL GRN (GLOVE) ×3 IMPLANT
GOWN STRL REUS W/ TWL LRG LVL3 (GOWN DISPOSABLE) ×2 IMPLANT
GOWN STRL REUS W/ TWL XL LVL3 (GOWN DISPOSABLE) IMPLANT
GOWN STRL REUS W/TWL 2XL LVL3 (GOWN DISPOSABLE) IMPLANT
GOWN STRL REUS W/TWL LRG LVL3 (GOWN DISPOSABLE) ×4
GOWN STRL REUS W/TWL XL LVL3 (GOWN DISPOSABLE)
HEMOSTAT POWDER KIT SURGIFOAM (HEMOSTASIS) ×3 IMPLANT
HEMOSTAT SURGICEL 2X14 (HEMOSTASIS) IMPLANT
KIT BASIN OR (CUSTOM PROCEDURE TRAY) ×3 IMPLANT
KIT TURNOVER KIT B (KITS) ×3 IMPLANT
MARKER SKIN DUAL TIP RULER LAB (MISCELLANEOUS) IMPLANT
NEEDLE HYPO 22GX1.5 SAFETY (NEEDLE) ×3 IMPLANT
NS IRRIG 1000ML POUR BTL (IV SOLUTION) ×3 IMPLANT
PACK LAMINECTOMY NEURO (CUSTOM PROCEDURE TRAY) ×3 IMPLANT
PAD ARMBOARD 7.5X6 YLW CONV (MISCELLANEOUS) ×9 IMPLANT
PASSER CATH 65CM DISP (NEUROSURGERY SUPPLIES) ×3 IMPLANT
SHEATH PERITONEAL INTRO 61 (MISCELLANEOUS) ×3 IMPLANT
SPONGE LAP 4X18 RFD (DISPOSABLE) IMPLANT
STAPLER SKIN PROX WIDE 3.9 (STAPLE) ×3 IMPLANT
SUT ETHILON 3 0 FSL (SUTURE) ×6 IMPLANT
SUT NURALON 4 0 TR CR/8 (SUTURE) IMPLANT
SUT SILK 0 TIES 10X30 (SUTURE) IMPLANT
SUT SILK 3 0 SH 30 (SUTURE) IMPLANT
SUT VIC AB 2-0 CP2 18 (SUTURE) ×3 IMPLANT
SUT VIC AB 3-0 SH 8-18 (SUTURE) ×3 IMPLANT
TOWEL GREEN STERILE (TOWEL DISPOSABLE) ×3 IMPLANT
TOWEL GREEN STERILE FF (TOWEL DISPOSABLE) ×3 IMPLANT
TUBE CONNECTING 12'X1/4 (SUCTIONS)
TUBE CONNECTING 12X1/4 (SUCTIONS) IMPLANT
UNDERPAD 30X30 (UNDERPADS AND DIAPERS) ×3 IMPLANT
VALVE PROGRAM CERTAS SM ANTI (Valve) ×3 IMPLANT
WATER STERILE IRR 1000ML POUR (IV SOLUTION) ×3 IMPLANT

## 2018-06-10 NOTE — Progress Notes (Addendum)
SLP Cancellation Note  Patient Details Name: Natalie Friedman MRN: [id number] DOB: [birth year]   Cancelled treatment:        NPO today for shunt revision. Needs SLE as well. Will follow for safety with recommended po's and speech-lang-cog assessment.   [name] 06/10/2018, 12:56 PM   [name] Risk analyst [phone] Office [phone]

## 2018-06-10 NOTE — Progress Notes (Signed)
PT Cancellation Note  Patient Details Name: Natalie Friedman MRN: [id number] DOB: [birth year]   Cancelled Treatment:    Reason Eval/Treat Not Completed: Patient not medically ready. Pt is going to the OR today for shunt revision. PT to return s/p shunt revision to complete re-assessment of pt's mobility, as appropriate, as able.  Kittie Plater, PT, DPT Acute Rehabilitation Services Pager #: [phone] Office #: [phone]    [name] 06/10/2018, 8:09 AM

## 2018-06-10 NOTE — Progress Notes (Signed)
Neurosurgery Service Progress Note  Subjective: No acute events overnight, pseudomeningocele returned but no drainage from wound since LD came out  Objective: Vitals:   06/10/18 0200 06/10/18 0300 06/10/18 0400 06/10/18 0500  BP: (!) 163/70 (!) 142/77 139/85 132/77  Pulse: 62 67 74 79  Resp: 10 (!) 23 14 (!) 22  Temp:   99.5 F (37.5 C)   TempSrc:   Axillary   SpO2: 99% 95% 99% 99%  Weight:      Height:       Temp (24hrs), Avg:98.6 F (37 C), Min:98.2 F (36.8 C), Max:99.5 F (37.5 C)  CBC Latest Ref Rng & Units 06/07/2018 06/03/2018 05/08/2018  WBC 4.0 - 10.5 K/uL 6.2 7.9 6.2  Hemoglobin 12.0 - 15.0 g/dL 11.6(L) 11.4(L) 10.9(L)  Hematocrit 36.0 - 46.0 % 35.6(L) 34.2(L) 33.9(L)  Platelets 150 - 400 K/uL 124(L) 159 186   BMP Latest Ref Rng & Units 06/07/2018 06/03/2018 05/10/2018  Glucose 70 - 99 mg/dL 96 124(H) 103(H)  BUN 8 - 23 mg/dL 51(H) 43(H) 53(H)  Creatinine 0.44 - 1.00 mg/dL 2.75(H) 2.93(H) 4.01(H)  Sodium 135 - 145 mmol/L 137 131(L) 135  Potassium 3.5 - 5.1 mmol/L 5.2(H) 4.9 4.8  Chloride 98 - 111 mmol/L 104 96(L) 88(L)  CO2 22 - 32 mmol/L 23 26 35(H)  Calcium 8.9 - 10.3 mg/dL 9.8 9.6 9.8    Intake/Output Summary (Last 24 hours) at 06/10/2018 4315 Last data filed at 06/10/2018 0636 Gross per 24 hour  Intake 549.99 ml  Output 1450 ml  Net -900.01 ml    Current Facility-Administered Medications:  .  acetaminophen (TYLENOL) tablet 650 mg, 650 mg, Oral, Q4H PRN **OR** acetaminophen (TYLENOL) suppository 650 mg, 650 mg, Rectal, Q4H PRN, Jarika Robben A, MD .  amLODipine (NORVASC) tablet 5 mg, 5 mg, Oral, QPM, Panagiota Perfetti A, MD, 5 mg at 06/09/18 1710 .  carvedilol (COREG) tablet 12.5 mg, 12.5 mg, Oral, BID WC, Andrue Dini A, MD, 12.5 mg at 06/09/18 1710 .  cefTRIAXone (ROCEPHIN) 2 g in sodium chloride 0.9 % 100 mL IVPB, 2 g, Intravenous, Daily, Dariela Stoker A, MD, Last Rate: 200 mL/hr at 06/09/18 0932, 2 g at 06/09/18 0932 .  Chlorhexidine Gluconate  Cloth 2 % PADS 6 each, 6 each, Topical, Q0600, Judith Part, MD, 6 each at 06/09/18 1200 .  cholecalciferol (VITAMIN D3) tablet 4,000 Units, 4,000 Units, Oral, Daily, Judith Part, MD, 4,000 Units at 06/09/18 [phone] .  docusate sodium (COLACE) capsule 100 mg, 100 mg, Oral, BID, Judith Part, MD, 100 mg at 06/09/18 2213 .  famotidine (PEPCID) tablet 20 mg, 20 mg, Oral, Daily, Rumbarger, Valeda Malm, RPH, 20 mg at 06/09/18 0923 .  glimepiride (AMARYL) tablet 1 mg, 1 mg, Oral, Q breakfast, Devansh Riese A, MD, 1 mg at 06/09/18 0926 .  heparin injection 5,000 Units, 5,000 Units, Subcutaneous, Q8H, Judith Part, MD, 5,000 Units at 06/10/18 0622 .  HYDROcodone-acetaminophen (NORCO/VICODIN) 5-325 MG per tablet 1 tablet, 1 tablet, Oral, Q4H PRN, Judith Part, MD, 1 tablet at 06/07/18 0449 .  HYDROmorphone (DILAUDID) injection 0.5 mg, 0.5 mg, Intravenous, Q3H PRN, Judith Part, MD, 0.5 mg at 06/05/18 0840 .  insulin aspart (novoLOG) injection 0-15 Units, 0-15 Units, Subcutaneous, TID WC, Rylah Fukuda, Joyice Faster, MD, 2 Units at 06/09/18 1710 .  irbesartan (AVAPRO) tablet 150 mg, 150 mg, Oral, Daily, Judith Part, MD, 150 mg at 06/09/18 0926 .  labetalol (NORMODYNE,TRANDATE) injection 10-40 mg, 10-40 mg, Intravenous, Q10  min PRN, Judith Part, MD, 20 mg at 06/06/18 0830 .  naphazoline-glycerin (CLEAR EYES REDNESS) ophth solution 1 drop, 1 drop, Right Eye, QID, Judith Part, MD, 1 drop at 06/09/18 2213 .  ondansetron (ZOFRAN) tablet 4 mg, 4 mg, Oral, Q4H PRN **OR** ondansetron (ZOFRAN) injection 4 mg, 4 mg, Intravenous, Q4H PRN, Judith Part, MD, 4 mg at 06/07/18 0452 .  polyethylene glycol (MIRALAX / GLYCOLAX) packet 17 g, 17 g, Oral, Daily PRN, Judith Part, MD .  promethazine (PHENERGAN) tablet 12.5-25 mg, 12.5-25 mg, Oral, Q4H PRN, Judith Part, MD, 25 mg at 06/05/18 1245 .  vitamin B-12 (CYANOCOBALAMIN) tablet 500 mcg, 500 mcg,  Oral, Daily, Judith Part, MD, 500 mcg at 06/09/18 2353   Physical Exam: AOx1, PERRL, EOMI, +R LMN HB3, Strength 5/5 x4, SILTx4, no drift Pseudomeningocele present again, no active drainage with palpation  Assessment & Plan: 68 y.o. 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 2/24 CTH no ventriculomegaly, 2/25 LD placement, 2/26 LD came out ~20:00, 2/27 pseudomeningocele recurred  -leaking again, will take to the OR today for VP shunt placement, d/c'd acetazolamide due to renal function -NPO -SCDs/TEDs, SQH  Judith Part  06/10/18 7:28 AM

## 2018-06-10 NOTE — Anesthesia Preprocedure Evaluation (Addendum)
Anesthesia Evaluation  Patient identified by MRN, date of birth, ID band Patient awake    Reviewed: Allergy & Precautions, H&P , NPO status , Patient's Chart, lab work & pertinent test results, reviewed documented beta blocker date and time   Airway Mallampati: II  TM Distance: >3 FB Neck ROM: Full    Dental no notable dental hx. (+) Poor Dentition, Dental Advisory Given   Pulmonary neg pulmonary ROS,    Pulmonary exam normal breath sounds clear to auscultation       Cardiovascular hypertension, Pt. on medications and Pt. on home beta blockers +CHF   Rhythm:Regular Rate:Normal     Neuro/Psych Anxiety Depression negative neurological ROS     GI/Hepatic negative GI ROS, Neg liver ROS,   Endo/Other  diabetes, Type 2, Oral Hypoglycemic Agents  Renal/GU Renal InsufficiencyRenal disease  negative genitourinary   Musculoskeletal  (+) Arthritis , Osteoarthritis,    Abdominal   Peds  Hematology  (+) Blood dyscrasia, anemia ,   Anesthesia Other Findings   Reproductive/Obstetrics negative OB ROS                            Anesthesia Physical Anesthesia Plan  ASA: III  Anesthesia Plan: General   Post-op Pain Management:    Induction: Intravenous  PONV Risk Score and Plan: 4 or greater and Ondansetron, Dexamethasone and Treatment may vary due to age or medical condition  Airway Management Planned: Oral ETT  Additional Equipment:   Intra-op Plan:   Post-operative Plan: Extubation in OR  Informed Consent: I have reviewed the patients History and Physical, chart, labs and discussed the procedure including the risks, benefits and alternatives for the proposed anesthesia with the patient or authorized representative who has indicated his/her understanding and acceptance.     Dental advisory given  Plan Discussed with: CRNA  Anesthesia Plan Comments:         Anesthesia Quick  Evaluation

## 2018-06-10 NOTE — Transfer of Care (Signed)
Immediate Anesthesia Transfer of Care Note  Patient: Natalie Friedman  Procedure(s) Performed: SHUNT INSERTION VENTRICULAR-PERITONEAL (Left Head)  Patient Location: PACU  Anesthesia Type:General  Level of Consciousness: confused - within patient baseline level of cognition.  Airway & Oxygen Therapy: Patient Spontanous Breathing and Patient connected to face mask oxygen  Post-op Assessment: Report given to RN and Post -op Vital signs reviewed and stable  Post vital signs: Reviewed and stable  Last Vitals:  Vitals Value Taken Time  BP 162/86 06/10/2018  7:07 PM  Temp    Pulse 86 06/10/2018  7:08 PM  Resp 25 06/10/2018  7:08 PM  SpO2 100 % 06/10/2018  7:08 PM  Vitals shown include unvalidated device data.  Last Pain:  Vitals:   06/10/18 1200  TempSrc: Oral  PainSc: 0-No pain     Report to Remo Lipps RN in PACU. Patient's right eye remained taped per anesthesia care team plan and at baseline with pre-operative state, relayed concerns to Ostergard MD, no new orders at this time, labetalol given PRN for HTN in PACU. Hollis MD informed.     Complications: No apparent anesthesia complications

## 2018-06-10 NOTE — Anesthesia Procedure Notes (Signed)
Procedure Name: Intubation Date/Time: 06/10/2018 4:40 PM Performed by: Jearld Pies, CRNA Pre-anesthesia Checklist: Patient identified, Emergency Drugs available, Suction available and Patient being monitored Patient Re-evaluated:Patient Re-evaluated prior to induction Oxygen Delivery Method: Circle System Utilized Preoxygenation: Pre-oxygenation with 100% oxygen Induction Type: IV induction Ventilation: Mask ventilation without difficulty and Oral airway inserted - appropriate to patient size Laryngoscope Size: Mac and 3 Grade View: Grade I Tube type: Oral Tube size: 7.0 mm Number of attempts: 1 Airway Equipment and Method: Stylet and Oral airway Placement Confirmation: ETT inserted through vocal cords under direct vision,  positive ETCO2 and breath sounds checked- equal and bilateral Secured at: 20 cm Tube secured with: Tape Dental Injury: Teeth and Oropharynx as per pre-operative assessment

## 2018-06-10 NOTE — Op Note (Signed)
PATIENT: Natalie Friedman  DAY OF SURGERY: 06/10/18   PRE-OPERATIVE DIAGNOSIS:  Pseudomeningocele, CSF leak   POST-OPERATIVE DIAGNOSIS:  Pseudomeningocele, CSF leak   PROCEDURE:  Left ventriculoperitoneal shunt placement   SURGEON:  Surgeon(s) and Role:    Judith Part, MD - Primary    Earnie Larsson, MD - Assistant   ANESTHESIA: ETGA   BRIEF HISTORY: This is a 68 year old woman with a previously resected vestibular schwannoma two months ago. Her incision healed well and was flat, but roughly two weeks ago she had significant swelling underneath her incision and began leaking clear fluid through it, consistent with CSF. She was admitted and a lumbar drain was placed, which resolved the pseudomeningocele, but it returned as soon as drainage was stopped. I therefore recommended shunt placement to prevent long term leakage of CSF and its  associated risks. Given her prior right retrosigmoid surgery, I recommended a left sided approach to avoid tunneling through her prior surgical area. This was discussed with the patient as well as risks, benefits, and alternatives and wished to proceed with shunt placement.   OPERATIVE DETAIL: The patient was taken to the operating room and placed on the OR table in the supine position. A formal time out was performed with two patient identifiers and confirmed the operative site. Anesthesia was induced by the anesthesia team. The head was placed in a horseshoe head holder and turned to the right. The operative site was marked, hair was clipped with surgical clippers, and the entire pathway of the shunt was prepped and draped in a sterile fashion, from scalp to abdomen.   A curvilinear scalp incision was placed over Kocher's point on the left. A burr hole was created and ventricular catheter passed using standard landmarks with flow of clear CSF under normal pressure on the first pass. The abdominal incision was then created and a small laparotomy was performed.  The intra-peritoneal space was confirmed visually, an Gaspar Garbe 4 was passed across midline and irrigation was introduced to confirm that there were no adhesions blocking flow to the intraperitoneal space. The catheter was then tunneled subcutaneously and connected proximally to a Certas valve set to 1.0. Spontaneous distal flow was confirmed and the peritoneal portion was implanted.   The incisions were copiously irrigated, then closed in layers in the usual fashion.At the end of the procedure, the pseudomeningocele over her right retrosigmoid incision was flat. It was oversewn with 3-0 nylon to help prevent further leakage. All instrument and sponge counts were correct. The patient was then returned to anesthesia for emergence. No apparent complications at the completion of the procedure.   EBL:  42mL   DRAINS: none   SPECIMENS: none  IMPLANTS: Codman Certas ventriculoperitoneal shunt valve set to performance level 1.0   Judith Part, MD 06/10/18 4:44 PM

## 2018-06-10 NOTE — Anesthesia Postprocedure Evaluation (Signed)
Anesthesia Post Note  Patient: Natalie Friedman  Procedure(s) Performed: SHUNT INSERTION VENTRICULAR-PERITONEAL (Left Head)     Patient location during evaluation: PACU Anesthesia Type: General Level of consciousness: awake Pain management: pain level controlled Vital Signs Assessment: post-procedure vital signs reviewed and stable Respiratory status: spontaneous breathing, nonlabored ventilation, respiratory function stable and patient connected to nasal cannula oxygen Cardiovascular status: blood pressure returned to baseline and stable Postop Assessment: no apparent nausea or vomiting Anesthetic complications: no    Last Vitals:  Vitals:   06/10/18 2100 06/10/18 2200  BP: (!) 141/82 123/73  Pulse: 70 72  Resp: 17 14  Temp:    SpO2: 100% 100%    Last Pain:  Vitals:   06/10/18 2030  TempSrc: Axillary  PainSc:                  [name]

## 2018-06-10 NOTE — Progress Notes (Signed)
OT Cancellation Note  Patient Details Name: Natalie Friedman MRN: [id number] DOB: 09-13-1950   Cancelled Treatment:    Reason Eval/Treat Not Completed: Patient not medically ready. Pt is going to the OR today for shunt revision. OT to return s/p shunt revision to continue therapy services.  [name], OTR/L Acute Rehab Services Pager [phone] Office [phone]      Almon Register 06/10/2018, 12:23 PM

## 2018-06-10 NOTE — Anesthesia Procedure Notes (Deleted)
Performed by: Jearld Pies, CRNA

## 2018-06-10 NOTE — Brief Op Note (Signed)
06/10/2018  6:53 PM  PATIENT:  Natalie Friedman  68 y.o. female  PRE-OPERATIVE DIAGNOSIS:  Pseudomeningocele, CSF leak  POST-OPERATIVE DIAGNOSIS:  Pseudomeningocele, CSF leak  PROCEDURE:  Procedure(s): SHUNT INSERTION VENTRICULAR-PERITONEAL (Left)  SURGEON:  Surgeon(s) and Role:    * Rashmi Tallent, Joyice Faster, MD - Primary    * Earnie Larsson, MD - Assisting  ANESTHESIA:   general  EBL:  50 mL   BLOOD ADMINISTERED:none  DRAINS: none   LOCAL MEDICATIONS USED:  LIDOCAINE   SPECIMEN:  No Specimen  DISPOSITION OF SPECIMEN:  N/A  COUNTS:  YES  TOURNIQUET:  * No tourniquets in log *  DICTATION: .Note written in EPIC  PLAN OF CARE: Admit to inpatient   PATIENT DISPOSITION:  PACU - hemodynamically stable.   Delay start of Pharmacological VTE agent (>24hrs) due to surgical blood loss or risk of bleeding: yes

## 2018-06-10 NOTE — Progress Notes (Signed)
Changed bed pad, during process noted large amount of drainage from op site on sheets and in pt's hair. No neurological changes noted. Physician on call notified. Will continue to monitor.

## 2018-06-11 ENCOUNTER — Encounter (HOSPITAL_COMMUNITY): Payer: Self-pay | Admitting: Neurological Surgery

## 2018-06-11 LAB — GLUCOSE, CAPILLARY
Glucose-Capillary: 90 mg/dL (ref 70–99)
Glucose-Capillary: 113 mg/dL — ABNORMAL HIGH (ref 70–99)
Glucose-Capillary: 131 mg/dL — ABNORMAL HIGH (ref 70–99)
Glucose-Capillary: 103 mg/dL — ABNORMAL HIGH (ref 70–99)
Glucose-Capillary: 82 mg/dL (ref 70–99)

## 2018-06-11 MED ORDER — TAMSULOSIN HCL 0.4 MG PO CAPS
0.4000 mg | ORAL_CAPSULE | Freq: Every day | ORAL | Status: DC
  Administered 2018-06-11 – 2018-06-15 (×5): 0.4 mg via ORAL
  Filled 2018-06-11 (×5): qty 1

## 2018-06-11 NOTE — Progress Notes (Signed)
Neurosurgery Service Progress Note  Subjective: No acute events overnight, pseudomeningocele flat this morning, no drainage overnight   Objective: Vitals:   06/11/18 0400 06/11/18 0500 06/11/18 0600 06/11/18 0700  BP: 136/79 (!) 145/84 (!) 154/75 (!) 148/76  Pulse: 69 69 69 72  Resp: 11 18 (!) 22   Temp: 98.9 F (37.2 C)     TempSrc: Axillary     SpO2: 93% 94% 96% 95%  Weight:      Height:       Temp (24hrs), Avg:98.2 F (36.8 C), Min:97 F (36.1 C), Max:98.9 F (37.2 C)  CBC Latest Ref Rng & Units 06/07/2018 06/03/2018 05/08/2018  WBC 4.0 - 10.5 K/uL 6.2 7.9 6.2  Hemoglobin 12.0 - 15.0 g/dL 11.6(L) 11.4(L) 10.9(L)  Hematocrit 36.0 - 46.0 % 35.6(L) 34.2(L) 33.9(L)  Platelets 150 - 400 K/uL 124(L) 159 186   BMP Latest Ref Rng & Units 06/07/2018 06/03/2018 05/10/2018  Glucose 70 - 99 mg/dL 96 124(H) 103(H)  BUN 8 - 23 mg/dL 51(H) 43(H) 53(H)  Creatinine 0.44 - 1.00 mg/dL 2.75(H) 2.93(H) 4.01(H)  Sodium 135 - 145 mmol/L 137 131(L) 135  Potassium 3.5 - 5.1 mmol/L 5.2(H) 4.9 4.8  Chloride 98 - 111 mmol/L 104 96(L) 88(L)  CO2 22 - 32 mmol/L 23 26 35(H)  Calcium 8.9 - 10.3 mg/dL 9.8 9.6 9.8    Intake/Output Summary (Last 24 hours) at 06/11/2018 0738 Last data filed at 06/11/2018 0300 Gross per 24 hour  Intake 1210 ml  Output 1475 ml  Net -265 ml    Current Facility-Administered Medications:  .  0.9 %  sodium chloride infusion, , Intravenous, Continuous, Zephan Beauchaine A, MD .  acetaminophen (TYLENOL) tablet 650 mg, 650 mg, Oral, Q4H PRN **OR** acetaminophen (TYLENOL) suppository 650 mg, 650 mg, Rectal, Q4H PRN, Judith Part, MD .  acetaminophen (TYLENOL) tablet 1,000 mg, 1,000 mg, Oral, Once, Nonna Renninger A, MD .  amLODipine (NORVASC) tablet 5 mg, 5 mg, Oral, QPM, Juliauna Stueve A, MD, 5 mg at 06/09/18 1710 .  carvedilol (COREG) tablet 12.5 mg, 12.5 mg, Oral, BID WC, Amanda Pote A, MD, 12.5 mg at 06/10/18 1355 .  Chlorhexidine Gluconate Cloth 2 % PADS 6  each, 6 each, Topical, Q0600, Judith Part, MD, 6 each at 06/10/18 1317 .  cholecalciferol (VITAMIN D3) tablet 4,000 Units, 4,000 Units, Oral, Daily, Judith Part, MD, 4,000 Units at 06/09/18 [phone] .  docusate sodium (COLACE) capsule 100 mg, 100 mg, Oral, BID, Judith Part, MD, 100 mg at 06/09/18 2213 .  famotidine (PEPCID) tablet 20 mg, 20 mg, Oral, Daily, Judith Part, MD, 20 mg at 06/09/18 0923 .  glimepiride (AMARYL) tablet 1 mg, 1 mg, Oral, Q breakfast, Devion Chriscoe, Joyice Faster, MD, 1 mg at 06/09/18 0926 .  HYDROcodone-acetaminophen (NORCO/VICODIN) 5-325 MG per tablet 1 tablet, 1 tablet, Oral, Q4H PRN, Judith Part, MD, 1 tablet at 06/07/18 0449 .  HYDROmorphone (DILAUDID) injection 0.5 mg, 0.5 mg, Intravenous, Q3H PRN, Judith Part, MD, 0.5 mg at 06/10/18 1938 .  insulin aspart (novoLOG) injection 0-15 Units, 0-15 Units, Subcutaneous, TID WC, Zamzam Whinery, Joyice Faster, MD, 2 Units at 06/09/18 1710 .  irbesartan (AVAPRO) tablet 150 mg, 150 mg, Oral, Daily, Judith Part, MD, 150 mg at 06/09/18 0926 .  labetalol (NORMODYNE,TRANDATE) injection 10-40 mg, 10-40 mg, Intravenous, Q10 min PRN, Judith Part, MD, 10 mg at 06/10/18 1941 .  naphazoline-glycerin (CLEAR EYES REDNESS) ophth solution 1 drop, 1 drop, Right Eye, QID,  Judith Part, MD, 1 drop at 06/10/18 2216 .  ondansetron (ZOFRAN) tablet 4 mg, 4 mg, Oral, Q4H PRN **OR** ondansetron (ZOFRAN) injection 4 mg, 4 mg, Intravenous, Q4H PRN, Judith Part, MD, 4 mg at 06/10/18 1935 .  polyethylene glycol (MIRALAX / GLYCOLAX) packet 17 g, 17 g, Oral, Daily PRN, Judith Part, MD .  promethazine (PHENERGAN) tablet 12.5-25 mg, 12.5-25 mg, Oral, Q4H PRN, Judith Part, MD, 25 mg at 06/05/18 1245 .  vitamin B-12 (CYANOCOBALAMIN) tablet 500 mcg, 500 mcg, Oral, Daily, Judith Part, MD, 500 mcg at 06/09/18 0712   Physical Exam: AOx1, PERRL, EOMI, +R LMN HB3, Strength 5/5 x4, SILTx4,  no drift Pseudomeningocele flat, no active drainage with palpation Incisions c/d/i  Assessment & Plan: 68 y.o. 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 2/24 CTH no ventriculomegaly, 2/25 LD placement, 2/26 LD came out ~20:00, 2/27 pseudomeningocele recurred, 3/2 CSF leakage again, 3/2 OR for L VP shunt  -CTH, AXR, CXR show shunt system in good position, she has a Certas valve set to performance level 1.0 -renal diet as tolerated -husband not at bedside this morning, but the patient was very clear preop that she does not want to go to rehab and wants to go home. If he is comfortable caring for her at home, she can be discharged today -SCDs/TEDs  Judith Part  06/11/18 7:38 AM

## 2018-06-11 NOTE — Care Management Note (Signed)
Case Management Note  Patient Details  Name: Natalie Friedman MRN: [id number] Date of Birth: [birth year]  Subjective/Objective: 68 y.o. female who had resection of vestibular schwannoma on 04/16/18 now presents with clear drainage from her wound due to CSF leak.  Pt s/p Lt VP shunt placement on 06/10/2018.  PTA, pt required assistance with ADLS; lives at home with spouse.                      Action/Plan: PT recommending CIR for rehab at discharge, but preoperatively pt and husband preferred home with home health services.  Pt with decreased balance and confusion post op; would benefit from rehab consult, as pt definite rehab need.    Expected Discharge Date:                  Expected Discharge Plan:  [place]  In-House Referral:     Discharge planning Services  CM Consult  Post Acute Care Choice:    Choice offered to:     DME Arranged:    DME Agency:     HH Arranged:    [place] Agency:     Status of Service:  In process, will continue to follow  If discussed at Long Length of Stay Meetings, dates discussed:    Additional Comments:  Reinaldo Raddle, RN, BSN  Trauma/Neuro ICU Case Manager [phone]

## 2018-06-11 NOTE — Progress Notes (Signed)
Physical Therapy Treatment/ RE-evaluation Patient Details Name: Natalie Friedman MRN: [id number] DOB: [birth year] Today's Date: 06/11/2018    History of Present Illness 68 y.o. female who had resection of vestibular schwannoma on 04/16/18 now presents with clear drainage from her wound due to CSF leak s/p lumbar drain 2/25 but came out night of 2/26. 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 Pt s/p shunt revision 3/2.    PT Comments    Pt admitted with above. Pt was ambulating with assist prior to shunt revision on 3/2 however pt now hallucinating, confused, high anxiety and demo's weakness and significantly impaired balance. Acute PT to cont to follow to progress mobility.  Follow Up Recommendations  CIR     Equipment Recommendations  None recommended by PT    Recommendations for Other Services Rehab consult     Precautions / Restrictions Precautions Precautions: Fall Restrictions Weight Bearing Restrictions: No    Mobility  Bed Mobility Overal bed mobility: Needs Assistance Bed Mobility: Rolling;Sidelying to Sit Rolling: Mod assist Sidelying to sit: Mod assist       General bed mobility comments: max directional v/c's, max tactile cues to complete task, modA for trunk elevation to achieve sitting EOB  Transfers Overall transfer level: Needs assistance Equipment used: 2 person hand held assist Transfers: Sit to/from Stand Sit to Stand: Mod assist;+2 physical assistance         General transfer comment: modA to power up, pt with increased anxiety causing her to impulsively sit back on bed x2 prior to attempting to ambulate, modA to steady pt during transition of hands  Ambulation/Gait Ambulation/Gait assistance: Mod assist;+2 safety/equipment Gait Distance (Feet): 5 Feet(x2) Assistive device: Rolling walker (2 wheeled) Gait Pattern/deviations: Step-to pattern;Decreased stride length Gait  velocity: slow Gait velocity interpretation: <1.8 ft/sec, indicate of risk for recurrent falls General Gait Details: pt very scared requiring verbal cues for deep breathing and to focus on instructions of PT. max step by step cues for sequencing of stepping, modA for walker management. s/p 5 feet pt would impulsively sit down in chair   Stairs             Wheelchair Mobility    Modified Rankin (Stroke Patients Only)       Balance Overall balance assessment: Needs assistance Sitting-balance support: Feet supported;Bilateral upper extremity supported Sitting balance-Leahy Scale: Poor     Standing balance support: Bilateral upper extremity supported Standing balance-Leahy Scale: Poor Standing balance comment: dependent on RW and physical assist                            Cognition Arousal/Alertness: Awake/alert Behavior During Therapy: WFL for tasks assessed/performed Overall Cognitive Status: Impaired/Different from baseline Area of Impairment: Orientation;Attention;Memory;Following commands;Safety/judgement;Awareness;Problem solving                 Orientation Level: Disoriented to;Time;Situation(unsure if it's aphasia or confusion) Current Attention Level: Focused Memory: Decreased short-term memory Following Commands: Follows one step commands inconsistently;Follows one step commands with increased time Safety/Judgement: Decreased awareness of safety;Decreased awareness of deficits Awareness: Intellectual Problem Solving: Slow processing;Decreased initiation;Difficulty sequencing;Requires verbal cues;Requires tactile cues General Comments: pt very confused, talking about school setting vs hospital, getting people confused, didn't recall she had surgery. Pt also with increased anxiety with mobility.       Exercises      General Comments General comments (skin integrity,  edema, etc.): pt with JP      Pertinent Vitals/Pain Pain Assessment:  No/denies pain    Home Living                      Prior Function            PT Goals (current goals can now be found in the care plan section) Acute Rehab PT Goals Patient Stated Goal: didn't state PT Goal Formulation: Patient unable to participate in goal setting Time For Goal Achievement: 06/25/18 Potential to Achieve Goals: Good Progress towards PT goals: Progressing toward goals    Frequency    Min 5X/week      PT Plan Current plan remains appropriate    Co-evaluation              AM-PAC PT "6 Clicks" Mobility   Outcome Measure  Help needed turning from your back to your side while in a flat bed without using bedrails?: A Lot Help needed moving from lying on your back to sitting on the side of a flat bed without using bedrails?: A Lot Help needed moving to and from a bed to a chair (including a wheelchair)?: A Lot Help needed standing up from a chair using your arms (e.g., wheelchair or bedside chair)?: A Lot Help needed to walk in hospital room?: A Lot Help needed climbing 3-5 steps with a railing? : Total 6 Click Score: 11    End of Session Equipment Utilized During Treatment: Gait belt Activity Tolerance: Patient tolerated treatment well Patient left: in chair;with call bell/phone within reach;with chair alarm set Nurse Communication: Mobility status PT Visit Diagnosis: Other abnormalities of gait and mobility (R26.89);Muscle weakness (generalized) (M62.81)     Time: 9728-2060 PT Time Calculation (min) (ACUTE ONLY): 23 min  Charges:                        Natalie Friedman, PT, DPT Acute Rehabilitation Services Pager #: [phone] Office #: [phone]    Natalie Friedman 06/11/2018, 1:00 PM

## 2018-06-12 LAB — GLUCOSE, CAPILLARY
Glucose-Capillary: 88 mg/dL (ref 70–99)
Glucose-Capillary: 120 mg/dL — ABNORMAL HIGH (ref 70–99)
Glucose-Capillary: 109 mg/dL — ABNORMAL HIGH (ref 70–99)

## 2018-06-12 NOTE — Progress Notes (Signed)
Occupational Therapy Treatment Patient Details Name: Natalie Friedman MRN: [id number] DOB: 02-14-1951 Today's Date: 06/12/2018    History of present illness 68 y.o. female who had resection of vestibular schwannoma on 04/16/18 now presents with clear drainage from her wound due to CSF leak s/p lumbar drain 2/25 but came out night of 2/26. 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 Pt s/p shunt revision 3/2.   OT comments  Pt continues to present with decreased strength, balance, and cognition. Upon arrival, pt sleeping heavily and very lethargic. Pt requiring increased cues and sitting at EOB to wake and increase arousal. At EOB, pt performing grooming tasks with Min Guard A-Mod A and Max cues for attention. Pt requiring Mod A +2 for stand pivot in transferring to new bed for transfer to new room. Continue to feel pt would benefit from intensive rehab, however, pending her progress, home with her husband and HHOT may be appropriate.  Will continue to follow acutely as admitted.   Follow Up Recommendations  Home health OT;Supervision/Assistance - 24 hour    Equipment Recommendations  3 in 1 bedside commode    Recommendations for Other Services      Precautions / Restrictions Precautions Precautions: Fall Precaution Comments: Confused and lethargic Restrictions Weight Bearing Restrictions: No       Mobility Bed Mobility Overal bed mobility: Needs Assistance Bed Mobility: Sidelying to Sit   Sidelying to sit: Max assist       General bed mobility comments: Pt sidelying and sleepy upon arrival. Pt requiring Max A for bringing BLEs over EOB and elevatring trunk into sitting  Transfers Overall transfer level: Needs assistance Equipment used: 1 person hand held assist Transfers: Sit to/from [facility] Sit to Stand: Mod assist;+2 safety/equipment Stand pivot transfers: Mod assist;+2  physical assistance       General transfer comment: Mod A to power up into standing. Pt then requiring Mod A to maintain balance and turn to left    Balance Overall balance assessment: Needs assistance Sitting-balance support: Feet supported;No upper extremity supported Sitting balance-Leahy Scale: Fair     Standing balance support: Bilateral upper extremity supported Standing balance-Leahy Scale: Poor Standing balance comment: reliant on physical A                           ADL either performed or assessed with clinical judgement   ADL Overall ADL's : Needs assistance/impaired     Grooming: Sitting;Wash/dry face;Brushing hair;Moderate assistance;Min guard Grooming Details (indicate cue type and reason): Pt requiring close Min Guard A for safety in sitting while her performed grooming. Pt abel to wash her face with Min guard. Requiring Mod A for brushing her hair and presenting with decreased attention to task and required Max cues.                 Toilet Transfer: Moderate assistance;+2 for safety/equipment;Stand-pivot Toilet Transfer Details (indicate cue type and reason): Mod A to power up into standing and then maintain standing.         Functional mobility during ADLs: Moderate assistance(SPT only this session) General ADL Comments: Presenting with decreased cognition, balance, and strength.      Vision   Vision Assessment?: Vision impaired- to be further tested in functional context Additional Comments: Upon arrival, pt with right eye tapped closed. Once husband arrived, he explained that it will not remain  closed so they tap for sleeping because eye patches were not working.   Perception     Praxis      Cognition Arousal/Alertness: Awake/alert Behavior During Therapy: Flat affect;Anxious Overall Cognitive Status: Impaired/Different from baseline Area of Impairment: Orientation;Attention;Memory;Following  commands;Safety/judgement;Awareness;Problem solving                 Orientation Level: Disoriented to;Place;Time;Situation Current Attention Level: Focused Memory: Decreased short-term memory Following Commands: Follows one step commands inconsistently;Follows one step commands with increased time Safety/Judgement: Decreased awareness of safety;Decreased awareness of deficits Awareness: Intellectual Problem Solving: Slow processing;Decreased initiation;Difficulty sequencing;Requires verbal cues;Requires tactile cues General Comments: Upon arrival, pt heavily sleeping and very lethargic. Requiring increased cues and sitting upright to arousal and waken. Pt with poor articulation and stating random words in responce to questions. Pt with poor attention to grooming tasks and required Max cues to complete tasks.         Exercises     Shoulder Instructions       General Comments Husband present    Pertinent Vitals/ Pain       Pain Assessment: Faces Faces Pain Scale: Hurts even more Pain Location: leg and generalized Pain Descriptors / Indicators: Discomfort;Grimacing;Moaning Pain Intervention(s): Limited activity within patient's tolerance;Repositioned  Home Living                                          Prior Functioning/Environment              Frequency  Min 3X/week        Progress Toward Goals  OT Goals(current goals can now be found in the care plan section)  Progress towards OT goals: Progressing toward goals  Acute Rehab OT Goals Patient Stated Goal: didn't state OT Goal Formulation: With patient/family Time For Goal Achievement: 06/20/18 Potential to Achieve Goals: Good ADL Goals Pt Will Perform Grooming: with modified independence;sitting Pt Will Perform Upper Body Dressing: with modified independence;sitting Pt Will Perform Lower Body Dressing: with min assist;with caregiver independent in assisting;sit to/from stand Pt Will  Transfer to Toilet: with supervision;stand pivot transfer Pt Will Perform Toileting - Clothing Manipulation and hygiene: with supervision;sit to/from stand Additional ADL Goal #1: Pt will perform bed mobility at mod I level prior to engaging in ADL  Plan Discharge plan remains appropriate    Co-evaluation                 AM-PAC OT "6 Clicks" Daily Activity     Outcome Measure   Help from another person eating meals?: A Little Help from another person taking care of personal grooming?: A Lot Help from another person toileting, which includes using toliet, bedpan, or urinal?: A Lot Help from another person bathing (including washing, rinsing, drying)?: A Lot Help from another person to put on and taking off regular upper body clothing?: A Little Help from another person to put on and taking off regular lower body clothing?: A Lot 6 Click Score: 14    End of Session Equipment Utilized During Treatment: Gait belt  OT Visit Diagnosis: Unsteadiness on feet (R26.81);Other abnormalities of gait and mobility (R26.89);Muscle weakness (generalized) (M62.81);Low vision, both eyes (H54.2);Other symptoms and signs involving cognitive function   Activity Tolerance Patient tolerated treatment well   Patient Left with family/visitor present;in bed;with nursing/sitter in room(transport to new room)   Nurse Communication Mobility status  Time: 1950-9326 OT Time Calculation (min): 19 min  Charges: OT General Charges $OT Visit: 1 Visit OT Treatments $Self Care/Home Management : 8-22 mins  [place], OTR/L Acute Rehab Pager: [phone] Office: [place] 06/12/2018, 5:26 PM

## 2018-06-12 NOTE — Progress Notes (Signed)
  Speech Language Pathology Treatment: Dysphagia  Patient Details Name: Natalie Friedman MRN: [id number] DOB: 01-17-1951 Today's Date: 06/12/2018 Time: 1102-1117 SLP Time Calculation (min) (ACUTE ONLY): 14 min  Assessment / Plan / Recommendation Clinical Impression  Pt's participation was limited by significant lethargy and confusion; pt was repositioned to elevated sidelying position to maximize alertness. Given max encouragement and verbal/tacile cues from the clinician, pt accepted sips of thin from a straw without overt s/s aspiration, however reduced labial seal was observed. Pt was oriented to self and place, however when probed for situation she was unable to express reason for hospitalization. Due to degree of lethargy, pt was not appropriate to participate in cognitive-linguistic evaluation; will continue efforts to complete. Recommend continue regular diet, thin liquids, and full supervision/assist for feeding and use of swallow precautions. ST will continue to follow acutely.   HPI HPI: 68 y.o. female who had resection of vestibular schwannoma on 04/16/18 now presents with clear drainage from her wound due to CSF leak s/p lumbar drain 2/25 but came out night of 2/26. 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 Pt seen by SLP during acute stay and then CIR, was consuming Dys 2, thin liquids at time of d/c on 05/10/18. Per RN pt noted to pocket food.      SLP Plan  Continue with current plan of care       Recommendations  Diet recommendations: Regular;Thin liquid Liquids provided via: Cup;Straw Medication Administration: Whole meds with liquid Supervision: Full supervision/cueing for compensatory strategies Compensations: Slow rate;Small sips/bites;Lingual sweep for clearance of pocketing;Other (Comment) Postural Changes and/or Swallow Maneuvers: Seated upright 90 degrees                Oral Care  Recommendations: Oral care BID Follow up Recommendations: Home health SLP SLP Visit Diagnosis: Dysphagia, unspecified (R13.10) Plan: Continue with current plan of care       Jettie Booze, Student SLP                Jettie Booze 06/12/2018, 2:30 PM

## 2018-06-12 NOTE — Progress Notes (Signed)
Neurosurgery Service Progress Note  Subjective: No acute events overnight, no drainage overnight   Objective: Vitals:   06/12/18 0400 06/12/18 0500 06/12/18 0600 06/12/18 0700  BP: (!) 151/78 (!) 148/73 (!) 157/97 (!) 143/92  Pulse: 81 76 78 85  Resp: 19 20 (!) 26   Temp: 98.3 F (36.8 C)     TempSrc: Oral     SpO2: 96% 93% 97% 96%  Weight:      Height:       Temp (24hrs), Avg:99 F (37.2 C), Min:98.3 F (36.8 C), Max:99.9 F (37.7 C)  CBC Latest Ref Rng & Units 06/07/2018 06/03/2018 05/08/2018  WBC 4.0 - 10.5 K/uL 6.2 7.9 6.2  Hemoglobin 12.0 - 15.0 g/dL 11.6(L) 11.4(L) 10.9(L)  Hematocrit 36.0 - 46.0 % 35.6(L) 34.2(L) 33.9(L)  Platelets 150 - 400 K/uL 124(L) 159 186   BMP Latest Ref Rng & Units 06/07/2018 06/03/2018 05/10/2018  Glucose 70 - 99 mg/dL 96 124(H) 103(H)  BUN 8 - 23 mg/dL 51(H) 43(H) 53(H)  Creatinine 0.44 - 1.00 mg/dL 2.75(H) 2.93(H) 4.01(H)  Sodium 135 - 145 mmol/L 137 131(L) 135  Potassium 3.5 - 5.1 mmol/L 5.2(H) 4.9 4.8  Chloride 98 - 111 mmol/L 104 96(L) 88(L)  CO2 22 - 32 mmol/L 23 26 35(H)  Calcium 8.9 - 10.3 mg/dL 9.8 9.6 9.8    Intake/Output Summary (Last 24 hours) at 06/12/2018 0805 Last data filed at 06/12/2018 0600 Gross per 24 hour  Intake 30 ml  Output 1075 ml  Net -1045 ml    Current Facility-Administered Medications:  .  0.9 %  sodium chloride infusion, , Intravenous, Continuous, Laticha Ferrucci, Joyice Faster, MD, Last Rate: 10 mL/hr at 06/11/18 0700 .  acetaminophen (TYLENOL) tablet 650 mg, 650 mg, Oral, Q4H PRN **OR** acetaminophen (TYLENOL) suppository 650 mg, 650 mg, Rectal, Q4H PRN, Judith Part, MD .  acetaminophen (TYLENOL) tablet 1,000 mg, 1,000 mg, Oral, Once, Allexis Bordenave A, MD .  amLODipine (NORVASC) tablet 5 mg, 5 mg, Oral, QPM, Jacoba Cherney, Joyice Faster, MD, 5 mg at 06/11/18 1703 .  carvedilol (COREG) tablet 12.5 mg, 12.5 mg, Oral, BID WC, Jhoana Upham, Joyice Faster, MD, 12.5 mg at 06/12/18 0748 .  Chlorhexidine Gluconate Cloth 2 % PADS 6  each, 6 each, Topical, Q0600, Judith Part, MD, 6 each at 06/11/18 1015 .  cholecalciferol (VITAMIN D3) tablet 4,000 Units, 4,000 Units, Oral, Daily, Judith Part, MD, 4,000 Units at 06/11/18 [phone] .  docusate sodium (COLACE) capsule 100 mg, 100 mg, Oral, BID, Judith Part, MD, 100 mg at 06/11/18 0955 .  famotidine (PEPCID) tablet 20 mg, 20 mg, Oral, Daily, Judith Part, MD, 20 mg at 06/11/18 0956 .  glimepiride (AMARYL) tablet 1 mg, 1 mg, Oral, Q breakfast, Jodie Cavey, Joyice Faster, MD, 1 mg at 06/12/18 0749 .  HYDROcodone-acetaminophen (NORCO/VICODIN) 5-325 MG per tablet 1 tablet, 1 tablet, Oral, Q4H PRN, Judith Part, MD, 1 tablet at 06/07/18 0449 .  HYDROmorphone (DILAUDID) injection 0.5 mg, 0.5 mg, Intravenous, Q3H PRN, Judith Part, MD, 0.5 mg at 06/10/18 1938 .  insulin aspart (novoLOG) injection 0-15 Units, 0-15 Units, Subcutaneous, TID WC, Gunnar Hereford, Joyice Faster, MD, 2 Units at 06/09/18 1710 .  irbesartan (AVAPRO) tablet 150 mg, 150 mg, Oral, Daily, Judith Part, MD, 150 mg at 06/11/18 0956 .  labetalol (NORMODYNE,TRANDATE) injection 10-40 mg, 10-40 mg, Intravenous, Q10 min PRN, Judith Part, MD, 10 mg at 06/10/18 1941 .  naphazoline-glycerin (CLEAR EYES REDNESS) ophth solution 1 drop, 1  drop, Right Eye, QID, Judith Part, MD, 1 drop at 06/11/18 2200 .  ondansetron (ZOFRAN) tablet 4 mg, 4 mg, Oral, Q4H PRN **OR** ondansetron (ZOFRAN) injection 4 mg, 4 mg, Intravenous, Q4H PRN, Judith Part, MD, 4 mg at 06/10/18 1935 .  polyethylene glycol (MIRALAX / GLYCOLAX) packet 17 g, 17 g, Oral, Daily PRN, Judith Part, MD .  promethazine (PHENERGAN) tablet 12.5-25 mg, 12.5-25 mg, Oral, Q4H PRN, Judith Part, MD, 25 mg at 06/05/18 1245 .  tamsulosin (FLOMAX) capsule 0.4 mg, 0.4 mg, Oral, Daily, Althia Egolf A, MD, 0.4 mg at 06/11/18 1527 .  vitamin B-12 (CYANOCOBALAMIN) tablet 500 mcg, 500 mcg, Oral, Daily, Judith Part, MD, 500 mcg at 06/11/18 3818   Physical Exam: AOx1, PERRL, EOMI, +R LMN HB3, Strength 5/5 x4, SILTx4, no drift Pseudomeningocele present, no active drainage with palpation Incisions c/d/i  Assessment & Plan: 68 y.o. 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 2/24 CTH no ventriculomegaly, 2/25 LD placement, 2/26 LD came out ~20:00, 2/27 pseudomeningocele recurred, 3/2 CSF leakage again, 3/2 OR for L VP shunt, 3/3 CTH, AXR, CXR show shunt system in good position, she has a Certas valve set to performance level 1.0, 3/4 shunt dialed to 0.5  -renal diet as tolerated -pseudomeningocele more full this morning, will dial shunt down to 0.5 to decrease CSF pressure on wound. Pt not having any headaches or low pressure symptoms -SCDs/TEDs  Judith Part  06/12/18 8:05 AM

## 2018-06-12 NOTE — Progress Notes (Signed)
Physical Therapy Treatment Patient Details Name: Natalie Friedman MRN: [id number] DOB: January 04, 1951 Today's Date: 06/12/2018    History of Present Illness 68 y.o. female who had resection of vestibular schwannoma on 04/16/18 now presents with clear drainage from her wound due to CSF leak s/p lumbar drain 2/25 but came out night of 2/26. 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 Pt s/p shunt revision 3/2.    PT Comments    Pt cont to be very confused and not oriented. Pt with impaired processing, sequencing, command follow, and initiation. Pt with strong R lateral lean today and inability to clear either foot during ambulation. Pt with antalgic  RLE limp this date compared to yesterday. Pt unable to safely return home at this time and would greatly benefit from CIR upon d/c for maximal functional return.    Follow Up Recommendations  CIR     Equipment Recommendations  None recommended by PT    Recommendations for Other Services Rehab consult     Precautions / Restrictions Precautions Precautions: Fall Precaution Comments: confused Restrictions Weight Bearing Restrictions: No    Mobility  Bed Mobility Overal bed mobility: Needs Assistance Bed Mobility: Rolling;Sidelying to Sit Rolling: Min assist Sidelying to sit: Mod assist;Min assist       General bed mobility comments: max directional v/c's, max tactile cues to complete task, minA for trunk elevation to achieve sitting EOB  Transfers Overall transfer level: Needs assistance Equipment used: 2 person hand held assist Transfers: Sit to/from Stand Sit to Stand: Mod assist;+2 physical assistance         General transfer comment: modA to power up, pt with increased anxiety causing her to impulsively sit back on bed x2 prior to attempting to ambulate, modA to steady pt during transition of hands  Ambulation/Gait Ambulation/Gait assistance: Max  assist;+2 physical assistance Gait Distance (Feet): 8 Feet(x1, 10x1) Assistive device: Rolling walker (2 wheeled) Gait Pattern/deviations: Step-to pattern;Decreased step length - right;Decreased stride length;Decreased dorsiflexion - right;Staggering left;Decreased weight shift to right Gait velocity: slow Gait velocity interpretation: <1.8 ft/sec, indicate of risk for recurrent falls General Gait Details: pt with antalgic limp with R LE, significant R lateral lean despite assist x2 and max verbal and tactile cues. Pt with vearing to the L and requiring maxA for walker management., pt unable to clear either foot and is very anxious/fear of falling   Marine scientist [place] (Stroke Patients Only)       Balance Overall balance assessment: Needs assistance Sitting-balance support: Feet supported;No upper extremity supported Sitting balance-Leahy Scale: Fair     Standing balance support: Bilateral upper extremity supported Standing balance-Leahy Scale: Poor Standing balance comment: dependent on RW and physical assist                            Cognition Arousal/Alertness: Awake/alert Behavior During Therapy: Agitated Overall Cognitive Status: Impaired/Different from baseline Area of Impairment: Orientation;Attention;Memory;Following commands;Safety/judgement;Awareness;Problem solving                 Orientation Level: Disoriented to;Place;Time;Situation Current Attention Level: Focused Memory: Decreased short-term memory Following Commands: Follows one step commands inconsistently;Follows one step commands with increased time Safety/Judgement: Decreased awareness of safety;Decreased awareness of deficits(pt very confused) Awareness: Intellectual Problem Solving: Slow processing;Decreased initiation;Difficulty sequencing;Requires verbal cues;Requires tactile  cues General Comments: pt extremely confused. Pt stating and  perseverating on having surgery this morning. Pt talking about not having a mother and asking when the kids are coming. Pt also with increased anxiety with mobility and fearful.      Exercises      General Comments General comments (skin integrity, edema, etc.): VSS      Pertinent Vitals/Pain Pain Assessment: No/denies pain    Home Living                      Prior Function            PT Goals (current goals can now be found in the care plan section) Progress towards PT goals: Not progressing toward goals - comment(due to impaired cognition)    Frequency    Min 5X/week      PT Plan Current plan remains appropriate    Co-evaluation              AM-PAC PT "6 Clicks" Mobility   Outcome Measure  Help needed turning from your back to your side while in a flat bed without using bedrails?: A Lot Help needed moving from lying on your back to sitting on the side of a flat bed without using bedrails?: A Lot Help needed moving to and from a bed to a chair (including a wheelchair)?: A Lot Help needed standing up from a chair using your arms (e.g., wheelchair or bedside chair)?: A Lot Help needed to walk in hospital room?: A Lot Help needed climbing 3-5 steps with a railing? : Total 6 Click Score: 11    End of Session Equipment Utilized During Treatment: Gait belt Activity Tolerance: Patient limited by fatigue Patient left: in chair;with call bell/phone within reach;with chair alarm set Nurse Communication: Mobility status PT Visit Diagnosis: Other abnormalities of gait and mobility (R26.89);Muscle weakness (generalized) (M62.81)     Time: 0160-1093 PT Time Calculation (min) (ACUTE ONLY): 28 min  Charges:  $Gait Training: 8-22 mins $Neuromuscular Re-education: 8-22 mins                     Kittie Plater, PT, DPT Acute Rehabilitation Services Pager #: [phone] Office #: [phone]    [name] 06/12/2018, 11:16 AM

## 2018-06-13 LAB — GLUCOSE, CAPILLARY
GLUCOSE-CAPILLARY: 100 mg/dL — AB (ref 70–99)
Glucose-Capillary: 99 mg/dL (ref 70–99)
Glucose-Capillary: 118 mg/dL — ABNORMAL HIGH (ref 70–99)

## 2018-06-13 NOTE — Progress Notes (Signed)
Neurosurgery Service Progress Note  Subjective: No acute events overnight, no RN or pt reports of drainage overnight   Objective: Vitals:   06/12/18 1638 06/12/18 2007 06/13/18 0010 06/13/18 0358  BP: (!) 162/106 (!) 159/86 (!) 155/83 (!) 149/76  Pulse: 84 81 88 89  Resp: 18 18 18 18   Temp: 98.4 F (36.9 C) 98.2 F (36.8 C) 98 F (36.7 C) 98.3 F (36.8 C)  TempSrc:  Oral Oral Oral  SpO2: 97% 98% 99% 98%  Weight:      Height:       Temp (24hrs), Avg:98.4 F (36.9 C), Min:98 F (36.7 C), Max:98.7 F (37.1 C)  CBC Latest Ref Rng & Units 06/07/2018 06/03/2018 05/08/2018  WBC 4.0 - 10.5 K/uL 6.2 7.9 6.2  Hemoglobin 12.0 - 15.0 g/dL 11.6(L) 11.4(L) 10.9(L)  Hematocrit 36.0 - 46.0 % 35.6(L) 34.2(L) 33.9(L)  Platelets 150 - 400 K/uL 124(L) 159 186   BMP Latest Ref Rng & Units 06/07/2018 06/03/2018 05/10/2018  Glucose 70 - 99 mg/dL 96 124(H) 103(H)  BUN 8 - 23 mg/dL 51(H) 43(H) 53(H)  Creatinine 0.44 - 1.00 mg/dL 2.75(H) 2.93(H) 4.01(H)  Sodium 135 - 145 mmol/L 137 131(L) 135  Potassium 3.5 - 5.1 mmol/L 5.2(H) 4.9 4.8  Chloride 98 - 111 mmol/L 104 96(L) 88(L)  CO2 22 - 32 mmol/L 23 26 35(H)  Calcium 8.9 - 10.3 mg/dL 9.8 9.6 9.8    Intake/Output Summary (Last 24 hours) at 06/13/2018 2542 Last data filed at 06/12/2018 2200 Gross per 24 hour  Intake 560 ml  Output -  Net 560 ml    Current Facility-Administered Medications:  .  0.9 %  sodium chloride infusion, , Intravenous, Continuous, Bernedette Auston, Joyice Faster, MD, Stopped at 06/12/18 0800 .  acetaminophen (TYLENOL) tablet 650 mg, 650 mg, Oral, Q4H PRN **OR** acetaminophen (TYLENOL) suppository 650 mg, 650 mg, Rectal, Q4H PRN, Jerra Huckeby A, MD .  amLODipine (NORVASC) tablet 5 mg, 5 mg, Oral, QPM, Tiani Stanbery, Joyice Faster, MD, 5 mg at 06/12/18 1731 .  carvedilol (COREG) tablet 12.5 mg, 12.5 mg, Oral, BID WC, Ruthann Angulo A, MD, 12.5 mg at 06/12/18 1731 .  Chlorhexidine Gluconate Cloth 2 % PADS 6 each, 6 each, Topical, Q0600,  Judith Part, MD, 6 each at 06/12/18 1206 .  cholecalciferol (VITAMIN D3) tablet 4,000 Units, 4,000 Units, Oral, Daily, Judith Part, MD, 4,000 Units at 06/12/18 0957 .  docusate sodium (COLACE) capsule 100 mg, 100 mg, Oral, BID, Judith Part, MD, 100 mg at 06/12/18 2300 .  famotidine (PEPCID) tablet 20 mg, 20 mg, Oral, Daily, Darshawn Boateng, Joyice Faster, MD, 20 mg at 06/12/18 0957 .  glimepiride (AMARYL) tablet 1 mg, 1 mg, Oral, Q breakfast, Alvie Speltz, Joyice Faster, MD, 1 mg at 06/12/18 0749 .  HYDROcodone-acetaminophen (NORCO/VICODIN) 5-325 MG per tablet 1 tablet, 1 tablet, Oral, Q4H PRN, Judith Part, MD, 1 tablet at 06/07/18 0449 .  HYDROmorphone (DILAUDID) injection 0.5 mg, 0.5 mg, Intravenous, Q3H PRN, Judith Part, MD, 0.5 mg at 06/10/18 1938 .  insulin aspart (novoLOG) injection 0-15 Units, 0-15 Units, Subcutaneous, TID WC, Deserie Dirks, Joyice Faster, MD, 2 Units at 06/09/18 1710 .  irbesartan (AVAPRO) tablet 150 mg, 150 mg, Oral, Daily, Judith Part, MD, 150 mg at 06/12/18 0957 .  labetalol (NORMODYNE,TRANDATE) injection 10-40 mg, 10-40 mg, Intravenous, Q10 min PRN, Judith Part, MD, 10 mg at 06/10/18 1941 .  naphazoline-glycerin (CLEAR EYES REDNESS) ophth solution 1 drop, 1 drop, Right Eye, QID, Emelda Brothers  A, MD, 1 drop at 06/12/18 2300 .  ondansetron (ZOFRAN) tablet 4 mg, 4 mg, Oral, Q4H PRN **OR** ondansetron (ZOFRAN) injection 4 mg, 4 mg, Intravenous, Q4H PRN, Judith Part, MD, 4 mg at 06/10/18 1935 .  polyethylene glycol (MIRALAX / GLYCOLAX) packet 17 g, 17 g, Oral, Daily PRN, Judith Part, MD .  promethazine (PHENERGAN) tablet 12.5-25 mg, 12.5-25 mg, Oral, Q4H PRN, Judith Part, MD, 25 mg at 06/05/18 1245 .  tamsulosin (FLOMAX) capsule 0.4 mg, 0.4 mg, Oral, Daily, Judith Part, MD, 0.4 mg at 06/12/18 0957 .  vitamin B-12 (CYANOCOBALAMIN) tablet 500 mcg, 500 mcg, Oral, Daily, Judith Part, MD, 500 mcg at 06/12/18  0957   Physical Exam: AOx1, PERRL, EOMI, +R LMN HB3, Strength 5/5 x4, SILTx4, no drift Pseudomeningocele present, no active drainage with palpation Incisions c/d/i  Assessment & Plan: 68 y.o. 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 2/24 CTH no ventriculomegaly, 2/25 LD placement, 2/26 LD came out ~20:00, 2/27 pseudomeningocele recurred, 3/2 CSF leakage again, 3/2 OR for L VP shunt, 3/3 CTH, AXR, CXR show shunt system in good position, she has a Certas valve set to performance level 1.0, 3/4 shunt dialed to 0.5  -renal diet as tolerated -pseudomeningocele still present, not tense, no leakage -monitor for one more day then likely home, pt refusing CIR -SCDs/TEDs  Judith Part  06/13/18 7:21 AM

## 2018-06-13 NOTE — Progress Notes (Signed)
  Speech Language Pathology Treatment: Dysphagia  Patient Details Name: Natalie Friedman MRN: [id number] DOB: 1951/04/09 Today's Date: 06/13/2018 Time: 1210-1240 SLP Time Calculation (min) (ACUTE ONLY): 30 min  Assessment / Plan / Recommendation Clinical Impression  Pt was seen for dysphagia treatment to assess tolerance of the current diet. Pt tolerated dyspahgia 2, dysphagia 3, and regular texture solids without overt s/sx of aspiration. However, moderate residue was noted in the right lateral sulcus which was removed with oral swab. It is noteworthy that mastication time was moderately increased with regular texture solids and following prolonged mastication and anterior spillage of food, whole pieces still remeained in the left lateral sulcus. Pocketing was reduced with mechanical soft solids and pt was resistant to mastication on the left to compensate for the weakness. The possibility of downgrading the pt's diet was discussed at length with the pt. She was initially resistant to this and stated that she just has to "wait until it improves...come back tomorrow and see". She was educated that her oral swallow function likely won't improve significantly by tomorrow and that it would be beneficial to compensate for the underlying weakness. She verbalized understanding but was not excited by this recommendation. Her diet will be downgraded to dysphagia 3 at this time and it is recommended that staff/family continue to cut up pt's foods. SLP will continue to follow to ensure tolerance.    HPI HPI: 68 y.o. female who had resection of vestibular schwannoma on 04/16/18 now presents with clear drainage from her wound due to CSF leak s/p lumbar drain 2/25 but came out night of 2/26. 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 Pt seen by SLP during acute stay and then CIR, was consuming Dys 2, thin liquids at time of d/c on 05/10/18. Per  RN pt noted to pocket food.      SLP Plan  Goals updated  Patient needs continued Speech Lanaguage Pathology Services    Recommendations  Diet recommendations: Dysphagia 3 (mechanical soft);Thin liquid Liquids provided via: Cup;Straw Medication Administration: Whole meds with puree Supervision: Full supervision/cueing for compensatory strategies Compensations: Slow rate;Small sips/bites;Lingual sweep for clearance of pocketing;Follow solids with liquid Postural Changes and/or Swallow Maneuvers: Seated upright 90 degrees;Upright 30-60 min after meal                Oral Care Recommendations: Oral care BID;Oral care before and after PO Follow up Recommendations: Inpatient Rehab SLP Visit Diagnosis: Dysphagia, oral phase (R13.11) Plan: Goals updated       Kavan Devan I. Hardin Negus, [place], [place] Office number [phone] Pager [place] 06/13/2018, 2:09 PM

## 2018-06-13 NOTE — Progress Notes (Signed)
Patient is active with [facility] for [facility] services as prior to admission; [name] with [facility] is aware of admission and is following for discharge; Natalie Friedman [phone]

## 2018-06-13 NOTE — Evaluation (Signed)
Speech Language Pathology Evaluation Patient Details Name: Natalie Friedman MRN: [id number] DOB: Mar 12, 1951 Today's Date: 06/13/2018 Time: 1275-1700 SLP Time Calculation (min) (ACUTE ONLY): 35 min  Problem List:  Patient Active Problem List   Diagnosis Date Noted  . 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 Suture reaction 05/10/2018  . Acute on chronic renal failure ([place])   . Hypertension   . Diabetes mellitus type 2 in obese ([place])   . CKD (chronic kidney disease)   . Schwannoma of vestibular nerve determined by biopsy ([place])   . Hypoalbuminemia due to protein-calorie malnutrition ([place])   . Essential hypertension   . Acute blood loss anemia   . Vestibular dysfunction of right ear   . Vestibular schwannoma ([place]) 04/16/2018  . Brain tumor ([place]) 04/16/2018  . Cellulitis and abscess of right leg   . 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 Primary osteoarthritis of right knee   . Nephrotic syndrome 05/15/2016  . CHF (congestive heart failure) ([place]) 05/12/2016  . DM (diabetes mellitus), type 2 with renal complications ([place]) 17/49/4496  . Hypertensive urgency 05/12/2016  . anasarca 05/12/2016  . Immobility 05/12/2016  . Proteinuria 05/12/2016  . Uncontrolled hypertension 08/19/2015   Past Medical History:  Past Medical History:  Diagnosis Date  . Anxiety   . Arthritis   . CHF (congestive heart failure) ([place])   . Chronic kidney disease   . Depression   . Diabetes mellitus without complication ([place])   . Hypertension   . Neuropathy   . Occult spina bifida    Past Surgical History:  Past Surgical History:  Procedure Laterality Date  . APPLICATION OF CRANIAL NAVIGATION N/A 04/16/2018   Procedure: APPLICATION OF CRANIAL NAVIGATION;  Surgeon: Judith Part, MD;  Location: [facility];  Service: Neurosurgery;  Laterality: N/A;  . CRANIOTOMY Right 04/16/2018   Procedure: Right craniotomy for tumor resection with brainlab;  Surgeon: Judith Part, MD;  Location: [facility];   Service: Neurosurgery;  Laterality: Right;  right  . ectopic    . INCISION AND DRAINAGE Right 10/04/2016   Procedure: INCISION AND DRAINAGE AND ASPIRATION;  Surgeon: Carole Civil, MD;  Location: [facility];  Service: Orthopedics;  Laterality: Right;  right knee wound  . patella fx Right 07/2015  . TOTAL KNEE ARTHROPLASTY Right 07/20/2016   Procedure: TOTAL KNEE ARTHROPLASTY;  Surgeon: Carole Civil, MD;  Location: [facility];  Service: Orthopedics;  Laterality: Right;  . VENTRICULOPERITONEAL SHUNT Left 06/10/2018   Procedure: SHUNT INSERTION VENTRICULAR-PERITONEAL;  Surgeon: Judith Part, MD;  Location: [facility];  Service: Neurosurgery;  Laterality: Left;   HPI:  68 y.o. female who had resection of vestibular schwannoma on 04/16/18 now presents with clear drainage from her wound due to CSF leak s/p lumbar drain 2/25 but came out night of 2/26. 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 Pt seen by SLP during acute stay and then CIR, was consuming Dys 2, thin liquids at time of d/c on 05/10/18. Per RN pt noted to pocket food.   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 In the area of auditory comprehension, she demonstrated difficulty following directions, and responding to yes/no questions related to complex sentences and paragraphs. Expressively, she exhibited difficulty with naming and word retrieval deficits were noted during sentence formulation. Neologistic and phonemic paraphasias were observed as well  as perserveration. Use of semantic and phonemic cues were ineffective in improving accuracy. With regards to cognition, pt appeared to demonstrate difficulty with attention, memory, orientation. However, the impact of her language function on these areas is considered. Moderate dysarthria was demonstrated  characterized by reduced articulatory precision secondary to oral motor weakness.     SLP Assessment  SLP Recommendation/Assessment: Patient needs continued Speech Lanaguage Pathology Services SLP Visit Diagnosis: Aphasia (R47.01);Cognitive communication deficit (R41.841)    Follow Up Recommendations  Inpatient Rehab    Frequency and Duration min 2x/week  2 weeks      SLP Evaluation Cognition  Overall Cognitive Status: Difficult to assess(Due to noted language deficits) Orientation Level: Oriented to person Memory: Impaired(Immediate: 3/3; Delayed: 0/3 ) Awareness: Impaired Awareness Impairment: Intellectual impairment       Comprehension  Auditory Comprehension Overall Auditory Comprehension: Impaired Yes/No Questions: Impaired Other Yes/No Questions Comments`: (Simple: 3/5; Complex: 3/5) Commands: Impaired One Step Basic Commands: (0/3) Two Step Basic Commands: (0/4) Conversation: Simple Other Conversation Comments: Paragraph (yes/no questions): 0/4 Reading Comprehension Reading Status: Not tested    Expression Expression Primary Mode of Expression: Verbal Verbal Expression Overall Verbal Expression: Impaired Initiation: No impairment Automatic Speech: (Days: 0/7; Months: 0/12; Counting: 0/10) Level of Generative/Spontaneous Verbalization: Phrase Naming: Impairment Responsive: (2/5) Confrontation: (1/10) Convergent: (Sentence completion: 3/5) Divergent: Not tested Verbal Errors: Neologisms;Perseveration Pragmatics: Impairment Impairments: Abnormal affect;Eye contact Interfering Components: Attention   Oral / Motor  Oral Motor/Sensory Function Overall Oral Motor/Sensory Function: Moderate impairment Facial ROM: Reduced right;Suspected CN VII (facial) dysfunction Facial Symmetry: Abnormal symmetry right;Suspected CN VII (facial) dysfunction Facial Strength: Reduced right;Suspected CN VII (facial) dysfunction Facial Sensation: Within Functional Limits Lingual  ROM: Reduced right;Suspected CN XII (hypoglossal) dysfunction Motor Speech Overall Motor Speech: Impaired Respiration: Within functional limits Phonation: Normal Resonance: Within functional limits Articulation: Impaired Level of Impairment: Phrase Intelligibility: Intelligibility reduced Word: 75-100% accurate Phrase: 75-100% accurate Sentence: 50-74% accurate Conversation: 50-74% accurate Motor Planning: Witnin functional limits Motor Speech Errors: Aware Effective Techniques: Slow rate;Over-articulate;Increased vocal intensity   Aaliyah Gavel I. Hardin Negus, [place], [place] Office number [phone] Pager [place] 06/13/2018, 1:44 PM

## 2018-06-13 NOTE — Progress Notes (Signed)
PT Cancellation Note  Patient Details Name: Natalie Friedman MRN: [id number] DOB: November 02, 1950   Cancelled Treatment:    Reason Eval/Treat Not Completed: Other (comment) Pt eating dinner upon arrival and requesting to finish eating. Will follow up as schedule allows.   Leighton Ruff, PT, DPT  Acute Rehabilitation Services  Pager: [phone] Office: [phone]    [name] 06/13/2018, 5:49 PM

## 2018-06-14 ENCOUNTER — Inpatient Hospital Stay (HOSPITAL_COMMUNITY): Payer: Medicare Other

## 2018-06-14 LAB — GLUCOSE, CAPILLARY
Glucose-Capillary: 122 mg/dL — ABNORMAL HIGH (ref 70–99)
Glucose-Capillary: 115 mg/dL — ABNORMAL HIGH (ref 70–99)
Glucose-Capillary: 132 mg/dL — ABNORMAL HIGH (ref 70–99)

## 2018-06-14 MED ORDER — HEPARIN SODIUM (PORCINE) 5000 UNIT/ML IJ SOLN
5000.0000 [IU] | Freq: Three times a day (TID) | INTRAMUSCULAR | Status: DC
  Administered 2018-06-14 – 2018-06-15 (×4): 5000 [IU] via SUBCUTANEOUS
  Filled 2018-06-14 (×4): qty 1

## 2018-06-14 NOTE — Progress Notes (Signed)
Neurosurgery Service Progress Note  Subjective: No acute events overnight, no RN or pt reports of drainage overnight   Objective: Vitals:   06/13/18 1558 06/13/18 2009 06/13/18 2341 06/14/18 0347  BP: (!) 135/91 138/77 (!) 160/88 (!) 136/93  Pulse: 71 67 76 76  Resp: 16 20 20 20   Temp: 99.5 F (37.5 C) 98.8 F (37.1 C) 99 F (37.2 C) 98.7 F (37.1 C)  TempSrc: Oral Oral Oral Oral  SpO2: 95% 95% 95% 94%  Weight:      Height:       Temp (24hrs), Avg:99.2 F (37.3 C), Min:98.7 F (37.1 C), Max:99.9 F (37.7 C)  CBC Latest Ref Rng & Units 06/07/2018 06/03/2018 05/08/2018  WBC 4.0 - 10.5 K/uL 6.2 7.9 6.2  Hemoglobin 12.0 - 15.0 g/dL 11.6(L) 11.4(L) 10.9(L)  Hematocrit 36.0 - 46.0 % 35.6(L) 34.2(L) 33.9(L)  Platelets 150 - 400 K/uL 124(L) 159 186   BMP Latest Ref Rng & Units 06/07/2018 06/03/2018 05/10/2018  Glucose 70 - 99 mg/dL 96 124(H) 103(H)  BUN 8 - 23 mg/dL 51(H) 43(H) 53(H)  Creatinine 0.44 - 1.00 mg/dL 2.75(H) 2.93(H) 4.01(H)  Sodium 135 - 145 mmol/L 137 131(L) 135  Potassium 3.5 - 5.1 mmol/L 5.2(H) 4.9 4.8  Chloride 98 - 111 mmol/L 104 96(L) 88(L)  CO2 22 - 32 mmol/L 23 26 35(H)  Calcium 8.9 - 10.3 mg/dL 9.8 9.6 9.8    Intake/Output Summary (Last 24 hours) at 06/14/2018 0800 Last data filed at 06/13/2018 1800 Gross per 24 hour  Intake 240 ml  Output -  Net 240 ml    Current Facility-Administered Medications:  .  0.9 %  sodium chloride infusion, , Intravenous, Continuous, Ostergard, Joyice Faster, MD, Stopped at 06/12/18 0800 .  acetaminophen (TYLENOL) tablet 650 mg, 650 mg, Oral, Q4H PRN **OR** acetaminophen (TYLENOL) suppository 650 mg, 650 mg, Rectal, Q4H PRN, Ostergard, Thomas A, MD .  amLODipine (NORVASC) tablet 5 mg, 5 mg, Oral, QPM, Ostergard, Joyice Faster, MD, 5 mg at 06/13/18 1739 .  carvedilol (COREG) tablet 12.5 mg, 12.5 mg, Oral, BID WC, Ostergard, Thomas A, MD, 12.5 mg at 06/13/18 1739 .  Chlorhexidine Gluconate Cloth 2 % PADS 6 each, 6 each, Topical, Q0600,  Judith Part, MD, 6 each at 06/13/18 1200 .  cholecalciferol (VITAMIN D3) tablet 4,000 Units, 4,000 Units, Oral, Daily, Judith Part, MD, 4,000 Units at 06/13/18 1152 .  docusate sodium (COLACE) capsule 100 mg, 100 mg, Oral, BID, Judith Part, MD, 100 mg at 06/13/18 2250 .  famotidine (PEPCID) tablet 20 mg, 20 mg, Oral, Daily, Ostergard, Thomas A, MD, 20 mg at 06/13/18 1152 .  glimepiride (AMARYL) tablet 1 mg, 1 mg, Oral, Q breakfast, Ostergard, Joyice Faster, MD, 1 mg at 06/13/18 1153 .  HYDROcodone-acetaminophen (NORCO/VICODIN) 5-325 MG per tablet 1 tablet, 1 tablet, Oral, Q4H PRN, Judith Part, MD, 1 tablet at 06/07/18 0449 .  HYDROmorphone (DILAUDID) injection 0.5 mg, 0.5 mg, Intravenous, Q3H PRN, Judith Part, MD, 0.5 mg at 06/10/18 1938 .  insulin aspart (novoLOG) injection 0-15 Units, 0-15 Units, Subcutaneous, TID WC, Ostergard, Joyice Faster, MD, 2 Units at 06/09/18 1710 .  irbesartan (AVAPRO) tablet 150 mg, 150 mg, Oral, Daily, Ostergard, Thomas A, MD, 150 mg at 06/13/18 1153 .  labetalol (NORMODYNE,TRANDATE) injection 10-40 mg, 10-40 mg, Intravenous, Q10 min PRN, Judith Part, MD, 10 mg at 06/10/18 1941 .  naphazoline-glycerin (CLEAR EYES REDNESS) ophth solution 1 drop, 1 drop, Right Eye, QID, Ostergard, Joyice Faster,  MD, 1 drop at 06/13/18 2300 .  ondansetron (ZOFRAN) tablet 4 mg, 4 mg, Oral, Q4H PRN **OR** ondansetron (ZOFRAN) injection 4 mg, 4 mg, Intravenous, Q4H PRN, Judith Part, MD, 4 mg at 06/10/18 1935 .  polyethylene glycol (MIRALAX / GLYCOLAX) packet 17 g, 17 g, Oral, Daily PRN, Judith Part, MD .  promethazine (PHENERGAN) tablet 12.5-25 mg, 12.5-25 mg, Oral, Q4H PRN, Judith Part, MD, 25 mg at 06/05/18 1245 .  tamsulosin (FLOMAX) capsule 0.4 mg, 0.4 mg, Oral, Daily, Ostergard, Thomas A, MD, 0.4 mg at 06/13/18 1154 .  vitamin B-12 (CYANOCOBALAMIN) tablet 500 mcg, 500 mcg, Oral, Daily, Judith Part, MD, 500 mcg at 06/13/18  1154   Physical Exam: AOx1, PERRL, EOMI, +R LMN HB3, Strength 5/5 x4, SILTx4, no drift Pseudomeningocele full, more tense this morning, no leakage Incisions c/d/i  Assessment & Plan: 68 y.o. 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 2/24 CTH no ventriculomegaly, 2/25 LD placement, 2/26 LD came out ~20:00, 2/27 pseudomeningocele recurred, 3/2 CSF leakage again, 3/2 OR for L VP shunt, 3/3 CTH, AXR, CXR show shunt system in good position, she has a Certas valve set to performance level 1.0, 3/4 shunt dialed to 0.5  -renal diet as tolerated -pseudomeningocele still present, more full this morning. Increased pt's HOB to 30 degrees, will repeat a CTH to evaluate ventricular size, XR skull to check shunt valve setting -SCDs/TEDs, SQH  Thomas A Ostergard  06/14/18 8:00 AM

## 2018-06-14 NOTE — Progress Notes (Signed)
  Speech Language Pathology Treatment: Dysphagia;Cognitive-Linquistic  Patient Details Name: Natalie Friedman MRN: [id number] DOB: [birth year] Today's Date: 06/14/2018 Time: 3086-5784 SLP Time Calculation (min) (ACUTE ONLY): 25 min  Assessment / Plan / Recommendation Clinical Impression  Cognitive-linguistic intervention provided during am care and meal, husband arrived mid session and clarified that pt often descends into this state of confusion, increased dysarthria and difficulty communicating when medical problems arise, though at baseline she is independent with feeding, oral care use of compensatory strategies for speech. Today pt able to increase volume and intelligibility reading single words with min cues. Orientation to self and place improved with question cues and repetition. Required verbal cues and reduction of environmental distractors to follow one step commands and sustain attention to meal. Able to masticate larger pieces of fruit with more time, but small particulate matter in omelette (green peppers and onions) were challenging given sensory deficit. Advised RN and husband to stay vigilant of clearing right oral cavity after meals given residue from yesterdays meal still present. RN observed giving meds whole in puree with success with use of compensatory strategies and verbal cues. Husband feels capable of managing deficits and is aware of strategies, feels she will return to baseline with further medical management though home health SLP f/u advised.   HPI HPI: 68 y.o. female who had resection of vestibular schwannoma on 04/16/18 now presents with clear drainage from her wound due to CSF leak s/p lumbar drain 2/25 but came out night of 2/26. 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 Pt seen by SLP during acute stay and then CIR, was consuming Dys 2, thin liquids at time of d/c on 05/10/18. Per RN pt noted to  pocket food.      SLP Plan  Continue with current plan of care       Recommendations  Diet recommendations: Dysphagia 3 (mechanical soft);Thin liquid Liquids provided via: Cup;Straw Medication Administration: Whole meds with puree Supervision: Full supervision/cueing for compensatory strategies Compensations: Slow rate;Small sips/bites;Lingual sweep for clearance of pocketing;Follow solids with liquid Postural Changes and/or Swallow Maneuvers: Seated upright 90 degrees;Upright 30-60 min after meal                Oral Care Recommendations: Oral care BID;Oral care before and after PO Follow up Recommendations: Home health SLP SLP Visit Diagnosis: Dysphagia, oral phase (R13.11) Plan: Continue with current plan of care       GO               Herbie [place], MA [place] Pager [phone] Office [phone]  Lynann Beaver 06/14/2018, 10:34 AM

## 2018-06-14 NOTE — Progress Notes (Signed)
Physical Therapy Treatment Patient Details Name: Natalie Friedman MRN: [id number] DOB: Sep 20, 1950 Today's Date: 06/14/2018    History of Present Illness 68 y.o. female who had resection of vestibular schwannoma on 04/16/18 now presents with clear drainage from her wound due to CSF leak s/p lumbar drain 2/25 but came out night of 2/26. 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 Pt s/p shunt revision 3/2.    PT Comments    Pt presented in bed with husband present. Pt agreed to transfer train and perform therex activities. Pt very confused throughout and required max cues to keep on track and reduce impulsiveness. Pt has difficulty using RUE and rolls to her right better as a result. Pt presented with urinary incontinence and was cleaned prior to transfer. Pt requires power up assistance off edge of bed and is limited d/t cognition and anxiety while in standing. Pt presented with urinary incontinence during stand pivot. Pt very slow processing cues during stand pivot transfer and required chair to be brought behind by tech d/t impulsivity attempting to sit before stand pivot transfer was complete. Pt progress indicates that d/c plan to CIR is appropriate. However pts husband is refusing placement and wants to take the pt home Pt will require HHPT. Pt's husband reports he has all necessary DME and will transport pt home in his car. Plan to progress with transfer training and attempt gait as pt is less anxious.   Follow Up Recommendations  CIR     Equipment Recommendations  None recommended by PT    Recommendations for Other Services Rehab consult     Precautions / Restrictions Precautions Precautions: Fall Precaution Comments: Confused and lethargic Restrictions Weight Bearing Restrictions: No    Mobility  Bed Mobility Overal bed mobility: Needs Assistance Bed Mobility: Rolling;Sidelying to Sit Rolling: Mod  assist Sidelying to sit: Max assist;HOB elevated       General bed mobility comments: Pt requires max cueing for hand placement and negotiating legs to the EOB.   Transfers Overall transfer level: Needs assistance Equipment used: Rolling walker (2 wheeled);2 person hand held assist Transfers: Sit to/from [facility] Sit to Stand: Mod assist;+2 safety/equipment Stand pivot transfers: Mod assist;+2 physical assistance;Max assist       General transfer comment: Mod A to power up into standing. Pt then requiring Mod A to maintain balance and turn to line up walker and body with recliner. Pt had event of urinary incontinence during stand pivot transfer and became more max A as she became anxious when trying to complete transfer to chair.   Ambulation/Gait             General Gait Details: did not attempt at this time.    Stairs             Wheelchair Mobility    Modified Rankin (Stroke Patients Only)       Balance Overall balance assessment: Needs assistance Sitting-balance support: Feet supported;Single extremity supported Sitting balance-Leahy Scale: Fair     Standing balance support: Bilateral upper extremity supported Standing balance-Leahy Scale: Poor Standing balance comment: reliant on physical A                            Cognition Arousal/Alertness: Awake/alert Behavior During Therapy: Flat affect;Anxious Overall Cognitive Status: Difficult to assess Area of Impairment: Orientation;Attention;Memory;Following commands;Safety/judgement;Awareness;Problem solving  Orientation Level: Disoriented to;Place;Time;Situation Current Attention Level: Focused Memory: Decreased short-term memory Following Commands: Follows one step commands inconsistently;Follows one step commands with increased time Safety/Judgement: Decreased awareness of safety;Decreased awareness of deficits Awareness: Intellectual Problem  Solving: Slow processing;Decreased initiation;Difficulty sequencing;Requires verbal cues;Requires tactile cues General Comments: Upon arrival, pt heavily sleeping and very lethargic. Requiring increased cues and sitting upright to arousal and waken. Pt with poor articulation and stating random words in responce to questions. Pt with poor attention to grooming tasks and required Max cues to complete tasks.       Exercises General Exercises - Lower Extremity Ankle Circles/Pumps: 10 reps;Both;Supine;AAROM Heel Slides: AAROM;10 reps;Both;Supine Hip ABduction/ADduction: 10 reps;AAROM;Both;Supine    General Comments        Pertinent Vitals/Pain Faces Pain Scale: Hurts little more Pain Location: leg and generalized Pain Descriptors / Indicators: Discomfort;Grimacing;Moaning    Home Living                      Prior Function            PT Goals (current goals can now be found in the care plan section) Acute Rehab PT Goals Patient Stated Goal: didn't state PT Goal Formulation: Patient unable to participate in goal setting Time For Goal Achievement: 06/25/18 Potential to Achieve Goals: Good    Frequency    Min 5X/week      PT Plan Current plan remains appropriate    Co-evaluation              AM-PAC PT "6 Clicks" Mobility   Outcome Measure  Help needed turning from your back to your side while in a flat bed without using bedrails?: A Lot Help needed moving from lying on your back to sitting on the side of a flat bed without using bedrails?: A Lot Help needed moving to and from a bed to a chair (including a wheelchair)?: A Lot Help needed standing up from a chair using your arms (e.g., wheelchair or bedside chair)?: A Lot Help needed to walk in hospital room?: A Lot Help needed climbing 3-5 steps with a railing? : Total 6 Click Score: 11    End of Session Equipment Utilized During Treatment: Gait belt Activity Tolerance: Patient limited by fatigue Patient  left: in chair;with call bell/phone within reach;with chair alarm set;with family/visitor present Nurse Communication: Mobility status PT Visit Diagnosis: Other abnormalities of gait and mobility (R26.89);Muscle weakness (generalized) (M62.81)     Time: 3646-8032 PT Time Calculation (min) (ACUTE ONLY): 30 min  Charges:  $Therapeutic Exercise: 8-22 mins $Therapeutic Activity: 8-22 mins                     Maryelizabeth Kaufmann, SPTA   Maryelizabeth Kaufmann 06/14/2018, 3:32 PM

## 2018-06-14 NOTE — Care Management Important Message (Signed)
Important Message  Patient Details  Name: Natalie Friedman MRN: [id number] Date of Birth: [birth year]   Medicare Important Message Given:  Yes    Shelda Altes 06/14/2018, 1:30 PM

## 2018-06-14 NOTE — Progress Notes (Signed)
CTH with catheter in good position, skull XR shows shunt setting appropriately changed to performance level 0.5. Pseudomeningocele much softer this afternoon after pt sitting up throughout the day. Keep head elevated at least 30 degrees. If pt requests HOB flat then place in reverse trendelenburg to keep head above the level of the abdomen and promote drainage through shunt. If pseudomeningocele is flat tomorrow, okay to go home. If it is tense in the morning, will tap her shunt to make sure it is flowing appropriately.

## 2018-06-14 NOTE — Plan of Care (Signed)
Progressing towards goals

## 2018-06-14 NOTE — Plan of Care (Signed)
  Problem: Activity: Goal: Risk for activity intolerance will decrease Outcome: Progressing   Problem: Coping: Goal: Level of anxiety will decrease Outcome: Not Progressing   Problem: Clinical Measurements: Goal: Will remain free from infection Outcome: Progressing

## 2018-06-15 LAB — GLUCOSE, CAPILLARY
GLUCOSE-CAPILLARY: 86 mg/dL (ref 70–99)
Glucose-Capillary: 78 mg/dL (ref 70–99)

## 2018-06-15 NOTE — Discharge Instructions (Signed)
Discharge Instructions  No restriction in activities, slowly increase your activity back to normal.   Your incision is closed with non-absorbable sutures. These will be removed by Dr. Zada Finders at your 2 week follow up visit. Please remind him if you have any staples on the other side of your scalp, as they can be difficult to see through the hair. If the sutures become bothersome or cause discomfort, apply some antibiotic ointment like bacitracin or neosporin on the sutures. This will soften them up and usually makes them more comfortable while they dissolve.  Okay to shower on the day of discharge. Be gentle when cleaning your incision. Use regular soap and water. If that is uncomfortable, try using baby shampoo. Do not submerge the wound under water for 2 weeks after surgery.  Follow up with Dr. Zada Finders in 2 weeks after discharge. If you do not already have a discharge appointment, please call his office at [phone] to schedule a follow up appointment. If you have any concerns or questions, please call the office and let us know.

## 2018-06-15 NOTE — Discharge Summary (Signed)
Discharge Summary  Date of Admission: 06/03/2018  Date of Discharge: 06/15/18  Attending Physician: Emelda Brothers, MD  Hospital Course: Patient was admitted following due to CSF drainage from her retrosigmoid incision. A lumbar drain was placed with decompression of the pseudomeningocele, but it came out and the pseudomeningocele quickly recurred and started leaking again profusely. She was therefore taken to the OR for placement of a left VP shunt with a Codman Certas set to 1.0. She was recovered in PACU and transferred to 4N. Her pseudomeningocele recurred on POD3, so her valve was changed to performance level 0.5. It was still a bit full, so we increased her head of bed to 30 degrees to promote drainage and it softened up, no further drainage since shunt placement. Her hospital course was otherwise uncomplicated and the patient was discharged home on 06/15/2018. She will follow up in clinic with me in 2 weeks.  Neurologic exam at discharge:  AOx3, PERRL, EOMI, R HB 5, TM Strength 5/5 x4, SILTx4 Incisions c/d/i  Discharge diagnosis: pseudomeningocele  Judith Part, MD 06/15/18 1:46 PM

## 2018-06-15 NOTE — Progress Notes (Signed)
Discussed with patient and spouse discharge instructions. They verbalized agreement and understanding. Patient to go home in private vehicle with all belongings.

## 2018-06-15 NOTE — Progress Notes (Signed)
Neurosurgery Service Progress Note  Subjective: No acute events overnight, no RN or pt reports of drainage overnight   Objective: Vitals:   06/14/18 2055 06/14/18 2306 06/15/18 0521 06/15/18 0918  BP: (!) 152/86 (!) 159/88 (!) 141/86 137/74  Pulse: 63 63 67 67  Resp:  20 20 18   Temp:  98.5 F (36.9 C) 97.9 F (36.6 C) 98.1 F (36.7 C)  TempSrc:  Oral Oral Oral  SpO2:  99%  96%  Weight:      Height:       Temp (24hrs), Avg:98.3 F (36.8 C), Min:97.9 F (36.6 C), Max:98.7 F (37.1 C)  CBC Latest Ref Rng & Units 06/07/2018 06/03/2018 05/08/2018  WBC 4.0 - 10.5 K/uL 6.2 7.9 6.2  Hemoglobin 12.0 - 15.0 g/dL 11.6(L) 11.4(L) 10.9(L)  Hematocrit 36.0 - 46.0 % 35.6(L) 34.2(L) 33.9(L)  Platelets 150 - 400 K/uL 124(L) 159 186   BMP Latest Ref Rng & Units 06/07/2018 06/03/2018 05/10/2018  Glucose 70 - 99 mg/dL 96 124(H) 103(H)  BUN 8 - 23 mg/dL 51(H) 43(H) 53(H)  Creatinine 0.44 - 1.00 mg/dL 2.75(H) 2.93(H) 4.01(H)  Sodium 135 - 145 mmol/L 137 131(L) 135  Potassium 3.5 - 5.1 mmol/L 5.2(H) 4.9 4.8  Chloride 98 - 111 mmol/L 104 96(L) 88(L)  CO2 22 - 32 mmol/L 23 26 35(H)  Calcium 8.9 - 10.3 mg/dL 9.8 9.6 9.8    Intake/Output Summary (Last 24 hours) at 06/15/2018 1344 Last data filed at 06/14/2018 1700 Gross per 24 hour  Intake 120 ml  Output -  Net 120 ml    Current Facility-Administered Medications:  .  0.9 %  sodium chloride infusion, , Intravenous, Continuous, Kathelene Rumberger, Joyice Faster, MD, Stopped at 06/12/18 0800 .  acetaminophen (TYLENOL) tablet 650 mg, 650 mg, Oral, Q4H PRN **OR** acetaminophen (TYLENOL) suppository 650 mg, 650 mg, Rectal, Q4H PRN, Daysean Tinkham A, MD .  amLODipine (NORVASC) tablet 5 mg, 5 mg, Oral, QPM, Arvind Mexicano, Joyice Faster, MD, 5 mg at 06/14/18 1719 .  carvedilol (COREG) tablet 12.5 mg, 12.5 mg, Oral, BID WC, Razan Siler, Joyice Faster, MD, 12.5 mg at 06/15/18 0856 .  Chlorhexidine Gluconate Cloth 2 % PADS 6 each, 6 each, Topical, Q0600, Judith Part, MD, 6 each  at 06/15/18 1235 .  cholecalciferol (VITAMIN D3) tablet 4,000 Units, 4,000 Units, Oral, Daily, Judith Part, MD, 4,000 Units at 06/15/18 1014 .  docusate sodium (COLACE) capsule 100 mg, 100 mg, Oral, BID, Judith Part, MD, 100 mg at 06/15/18 1014 .  famotidine (PEPCID) tablet 20 mg, 20 mg, Oral, Daily, Camry Theiss A, MD, 20 mg at 06/15/18 1014 .  glimepiride (AMARYL) tablet 1 mg, 1 mg, Oral, Q breakfast, Bailyn Spackman A, MD, 1 mg at 06/15/18 0856 .  heparin injection 5,000 Units, 5,000 Units, Subcutaneous, Q8H, Judith Part, MD, 5,000 Units at 06/15/18 0620 .  HYDROcodone-acetaminophen (NORCO/VICODIN) 5-325 MG per tablet 1 tablet, 1 tablet, Oral, Q4H PRN, Judith Part, MD, 1 tablet at 06/07/18 0449 .  HYDROmorphone (DILAUDID) injection 0.5 mg, 0.5 mg, Intravenous, Q3H PRN, Judith Part, MD, 0.5 mg at 06/10/18 1938 .  insulin aspart (novoLOG) injection 0-15 Units, 0-15 Units, Subcutaneous, TID WC, Ivette Castronova, Joyice Faster, MD, 2 Units at 06/09/18 1710 .  irbesartan (AVAPRO) tablet 150 mg, 150 mg, Oral, Daily, Molly Savarino A, MD, 150 mg at 06/15/18 1014 .  labetalol (NORMODYNE,TRANDATE) injection 10-40 mg, 10-40 mg, Intravenous, Q10 min PRN, Judith Part, MD, 10 mg at 06/10/18 1941 .  naphazoline-glycerin (CLEAR EYES REDNESS) ophth solution 1 drop, 1 drop, Right Eye, QID, Mutasim Tuckey, Joyice Faster, MD, 1 drop at 06/15/18 1015 .  ondansetron (ZOFRAN) tablet 4 mg, 4 mg, Oral, Q4H PRN **OR** ondansetron (ZOFRAN) injection 4 mg, 4 mg, Intravenous, Q4H PRN, Judith Part, MD, 4 mg at 06/10/18 1935 .  polyethylene glycol (MIRALAX / GLYCOLAX) packet 17 g, 17 g, Oral, Daily PRN, Judith Part, MD .  promethazine (PHENERGAN) tablet 12.5-25 mg, 12.5-25 mg, Oral, Q4H PRN, Judith Part, MD, 25 mg at 06/05/18 1245 .  tamsulosin (FLOMAX) capsule 0.4 mg, 0.4 mg, Oral, Daily, Gillie Crisci A, MD, 0.4 mg at 06/15/18 1014 .  vitamin B-12  (CYANOCOBALAMIN) tablet 500 mcg, 500 mcg, Oral, Daily, Judith Part, MD, 500 mcg at 06/15/18 1014   Physical Exam: AOx1, PERRL, EOMI, +R LMN HB3, Strength 5/5 x4, SILTx4, no drift Pseudomeningocele present but soft, no leakage Incisions c/d/i  Assessment & Plan: 68 y.o. 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 2/24 CTH no ventriculomegaly, 2/25 LD placement, 2/26 LD came out ~20:00, 2/27 pseudomeningocele recurred, 3/2 CSF leakage again, 3/2 OR for L VP shunt, 3/3 CTH, AXR, CXR show shunt system in good position, she has a Certas valve set to performance level 1.0, 3/4 shunt dialed to 0.5  -renal diet as tolerated -pseudomeningocele improved with sitting upright, encouraged her to do this at home -okay for discharge home today -SCDs/TEDs, Jackey Loge  06/15/18 1:44 PM

## 2018-06-16 DIAGNOSIS — M1991 Primary osteoarthritis, unspecified site: Secondary | ICD-10-CM | POA: Diagnosis not present

## 2018-06-16 DIAGNOSIS — Z5181 Encounter for therapeutic drug level monitoring: Secondary | ICD-10-CM | POA: Diagnosis not present

## 2018-06-16 DIAGNOSIS — Q760 Spina bifida occulta: Secondary | ICD-10-CM | POA: Diagnosis not present

## 2018-06-16 DIAGNOSIS — E1122 Type 2 diabetes mellitus with diabetic chronic kidney disease: Secondary | ICD-10-CM | POA: Diagnosis not present

## 2018-06-16 DIAGNOSIS — E1140 Type 2 diabetes mellitus with diabetic neuropathy, unspecified: Secondary | ICD-10-CM | POA: Diagnosis not present

## 2018-06-16 DIAGNOSIS — N189 Chronic kidney disease, unspecified: Secondary | ICD-10-CM | POA: Diagnosis not present

## 2018-06-16 DIAGNOSIS — D3610 Benign neoplasm of peripheral nerves and autonomic nervous system, unspecified: Secondary | ICD-10-CM | POA: Diagnosis not present

## 2018-06-16 DIAGNOSIS — I509 Heart failure, unspecified: Secondary | ICD-10-CM | POA: Diagnosis not present

## 2018-06-16 DIAGNOSIS — Z48811 Encounter for surgical aftercare following surgery on the nervous system: Secondary | ICD-10-CM | POA: Diagnosis not present

## 2018-06-16 DIAGNOSIS — Z96651 Presence of right artificial knee joint: Secondary | ICD-10-CM | POA: Diagnosis not present

## 2018-06-17 DIAGNOSIS — Z48811 Encounter for surgical aftercare following surgery on the nervous system: Secondary | ICD-10-CM | POA: Diagnosis not present

## 2018-06-17 DIAGNOSIS — Q760 Spina bifida occulta: Secondary | ICD-10-CM | POA: Diagnosis not present

## 2018-06-17 DIAGNOSIS — M1991 Primary osteoarthritis, unspecified site: Secondary | ICD-10-CM | POA: Diagnosis not present

## 2018-06-17 DIAGNOSIS — Z96651 Presence of right artificial knee joint: Secondary | ICD-10-CM | POA: Diagnosis not present

## 2018-06-17 DIAGNOSIS — E1140 Type 2 diabetes mellitus with diabetic neuropathy, unspecified: Secondary | ICD-10-CM | POA: Diagnosis not present

## 2018-06-17 DIAGNOSIS — Z5181 Encounter for therapeutic drug level monitoring: Secondary | ICD-10-CM | POA: Diagnosis not present

## 2018-06-17 DIAGNOSIS — E1122 Type 2 diabetes mellitus with diabetic chronic kidney disease: Secondary | ICD-10-CM | POA: Diagnosis not present

## 2018-06-17 DIAGNOSIS — N189 Chronic kidney disease, unspecified: Secondary | ICD-10-CM | POA: Diagnosis not present

## 2018-06-17 DIAGNOSIS — I509 Heart failure, unspecified: Secondary | ICD-10-CM | POA: Diagnosis not present

## 2018-06-17 DIAGNOSIS — D3610 Benign neoplasm of peripheral nerves and autonomic nervous system, unspecified: Secondary | ICD-10-CM | POA: Diagnosis not present

## 2018-06-18 DIAGNOSIS — E1140 Type 2 diabetes mellitus with diabetic neuropathy, unspecified: Secondary | ICD-10-CM | POA: Diagnosis not present

## 2018-06-18 DIAGNOSIS — E1122 Type 2 diabetes mellitus with diabetic chronic kidney disease: Secondary | ICD-10-CM | POA: Diagnosis not present

## 2018-06-18 DIAGNOSIS — Z96651 Presence of right artificial knee joint: Secondary | ICD-10-CM | POA: Diagnosis not present

## 2018-06-18 DIAGNOSIS — I509 Heart failure, unspecified: Secondary | ICD-10-CM | POA: Diagnosis not present

## 2018-06-18 DIAGNOSIS — M1991 Primary osteoarthritis, unspecified site: Secondary | ICD-10-CM | POA: Diagnosis not present

## 2018-06-18 DIAGNOSIS — Z5181 Encounter for therapeutic drug level monitoring: Secondary | ICD-10-CM | POA: Diagnosis not present

## 2018-06-18 DIAGNOSIS — N189 Chronic kidney disease, unspecified: Secondary | ICD-10-CM | POA: Diagnosis not present

## 2018-06-18 DIAGNOSIS — Z48811 Encounter for surgical aftercare following surgery on the nervous system: Secondary | ICD-10-CM | POA: Diagnosis not present

## 2018-06-18 DIAGNOSIS — Q760 Spina bifida occulta: Secondary | ICD-10-CM | POA: Diagnosis not present

## 2018-06-18 DIAGNOSIS — D3610 Benign neoplasm of peripheral nerves and autonomic nervous system, unspecified: Secondary | ICD-10-CM | POA: Diagnosis not present

## 2018-06-18 DIAGNOSIS — G9619 Other disorders of meninges, not elsewhere classified: Secondary | ICD-10-CM | POA: Diagnosis not present

## 2018-06-19 DIAGNOSIS — E1140 Type 2 diabetes mellitus with diabetic neuropathy, unspecified: Secondary | ICD-10-CM | POA: Diagnosis not present

## 2018-06-19 DIAGNOSIS — Z5181 Encounter for therapeutic drug level monitoring: Secondary | ICD-10-CM | POA: Diagnosis not present

## 2018-06-19 DIAGNOSIS — I120 Hypertensive chronic kidney disease with stage 5 chronic kidney disease or end stage renal disease: Secondary | ICD-10-CM | POA: Diagnosis not present

## 2018-06-19 DIAGNOSIS — Q760 Spina bifida occulta: Secondary | ICD-10-CM | POA: Diagnosis not present

## 2018-06-19 DIAGNOSIS — I509 Heart failure, unspecified: Secondary | ICD-10-CM | POA: Diagnosis not present

## 2018-06-19 DIAGNOSIS — M1991 Primary osteoarthritis, unspecified site: Secondary | ICD-10-CM | POA: Diagnosis not present

## 2018-06-19 DIAGNOSIS — D3610 Benign neoplasm of peripheral nerves and autonomic nervous system, unspecified: Secondary | ICD-10-CM | POA: Diagnosis not present

## 2018-06-19 DIAGNOSIS — Z48811 Encounter for surgical aftercare following surgery on the nervous system: Secondary | ICD-10-CM | POA: Diagnosis not present

## 2018-06-19 DIAGNOSIS — Z96651 Presence of right artificial knee joint: Secondary | ICD-10-CM | POA: Diagnosis not present

## 2018-06-19 DIAGNOSIS — N185 Chronic kidney disease, stage 5: Secondary | ICD-10-CM | POA: Diagnosis not present

## 2018-06-19 DIAGNOSIS — E1122 Type 2 diabetes mellitus with diabetic chronic kidney disease: Secondary | ICD-10-CM | POA: Diagnosis not present

## 2018-06-19 DIAGNOSIS — N189 Chronic kidney disease, unspecified: Secondary | ICD-10-CM | POA: Diagnosis not present

## 2018-06-19 DIAGNOSIS — G960 Cerebrospinal fluid leak: Secondary | ICD-10-CM | POA: Diagnosis not present

## 2018-06-20 DIAGNOSIS — I509 Heart failure, unspecified: Secondary | ICD-10-CM | POA: Diagnosis not present

## 2018-06-20 DIAGNOSIS — M1991 Primary osteoarthritis, unspecified site: Secondary | ICD-10-CM | POA: Diagnosis not present

## 2018-06-20 DIAGNOSIS — Q760 Spina bifida occulta: Secondary | ICD-10-CM | POA: Diagnosis not present

## 2018-06-20 DIAGNOSIS — Z48811 Encounter for surgical aftercare following surgery on the nervous system: Secondary | ICD-10-CM | POA: Diagnosis not present

## 2018-06-20 DIAGNOSIS — Z5181 Encounter for therapeutic drug level monitoring: Secondary | ICD-10-CM | POA: Diagnosis not present

## 2018-06-20 DIAGNOSIS — E1140 Type 2 diabetes mellitus with diabetic neuropathy, unspecified: Secondary | ICD-10-CM | POA: Diagnosis not present

## 2018-06-20 DIAGNOSIS — Z96651 Presence of right artificial knee joint: Secondary | ICD-10-CM | POA: Diagnosis not present

## 2018-06-20 DIAGNOSIS — E1122 Type 2 diabetes mellitus with diabetic chronic kidney disease: Secondary | ICD-10-CM | POA: Diagnosis not present

## 2018-06-20 DIAGNOSIS — D3610 Benign neoplasm of peripheral nerves and autonomic nervous system, unspecified: Secondary | ICD-10-CM | POA: Diagnosis not present

## 2018-06-20 DIAGNOSIS — N189 Chronic kidney disease, unspecified: Secondary | ICD-10-CM | POA: Diagnosis not present

## 2018-06-24 DIAGNOSIS — Q760 Spina bifida occulta: Secondary | ICD-10-CM | POA: Diagnosis not present

## 2018-06-24 DIAGNOSIS — E1122 Type 2 diabetes mellitus with diabetic chronic kidney disease: Secondary | ICD-10-CM | POA: Diagnosis not present

## 2018-06-24 DIAGNOSIS — Z5181 Encounter for therapeutic drug level monitoring: Secondary | ICD-10-CM | POA: Diagnosis not present

## 2018-06-24 DIAGNOSIS — Z96651 Presence of right artificial knee joint: Secondary | ICD-10-CM | POA: Diagnosis not present

## 2018-06-24 DIAGNOSIS — I509 Heart failure, unspecified: Secondary | ICD-10-CM | POA: Diagnosis not present

## 2018-06-24 DIAGNOSIS — N184 Chronic kidney disease, stage 4 (severe): Secondary | ICD-10-CM | POA: Diagnosis not present

## 2018-06-24 DIAGNOSIS — N189 Chronic kidney disease, unspecified: Secondary | ICD-10-CM | POA: Diagnosis not present

## 2018-06-24 DIAGNOSIS — D3610 Benign neoplasm of peripheral nerves and autonomic nervous system, unspecified: Secondary | ICD-10-CM | POA: Diagnosis not present

## 2018-06-24 DIAGNOSIS — E782 Mixed hyperlipidemia: Secondary | ICD-10-CM | POA: Diagnosis not present

## 2018-06-24 DIAGNOSIS — M1991 Primary osteoarthritis, unspecified site: Secondary | ICD-10-CM | POA: Diagnosis not present

## 2018-06-24 DIAGNOSIS — E1169 Type 2 diabetes mellitus with other specified complication: Secondary | ICD-10-CM | POA: Diagnosis not present

## 2018-06-24 DIAGNOSIS — E1140 Type 2 diabetes mellitus with diabetic neuropathy, unspecified: Secondary | ICD-10-CM | POA: Diagnosis not present

## 2018-06-24 DIAGNOSIS — Z48811 Encounter for surgical aftercare following surgery on the nervous system: Secondary | ICD-10-CM | POA: Diagnosis not present

## 2018-06-25 ENCOUNTER — Encounter (HOSPITAL_COMMUNITY): Payer: Self-pay | Admitting: *Deleted

## 2018-06-25 ENCOUNTER — Other Ambulatory Visit: Payer: Self-pay

## 2018-06-25 ENCOUNTER — Other Ambulatory Visit: Payer: Self-pay | Admitting: Neurological Surgery

## 2018-06-25 NOTE — Progress Notes (Addendum)
I spoke with Natalie Friedman, Natalie Friedman's husband and care giver.  [name] reports that patient mental status depends on the time of day, as the day progresses, she becomes less alert and sleeps more. Patient has type II DM, [name] reports that CBG's run 110- 130. I instructed patient to  Hold Amaryl in am. I instructed patient to check CBG after awaking and every 2 hours until arrival  to the hospital.  I Instructed patient if CBG is less than 70 to take 4 Glucose Tablets or 1 tube of Glucose Gel.   j Recheck CBG in 15 minutes then call pre- op desk at [phone] for further instructions.  [name] has left side weakness and left eye lid does not close.

## 2018-06-26 ENCOUNTER — Encounter (HOSPITAL_COMMUNITY)
Admission: RE | Disposition: A | Discharge status: Home / Self Care | Payer: Self-pay | Source: Home / Self Care | Attending: Neurological Surgery

## 2018-06-26 ENCOUNTER — Encounter (HOSPITAL_COMMUNITY): Payer: Self-pay

## 2018-06-26 ENCOUNTER — Ambulatory Visit (HOSPITAL_COMMUNITY): Payer: Medicare Other | Admitting: Certified Registered Nurse Anesthetist

## 2018-06-26 ENCOUNTER — Observation Stay (HOSPITAL_COMMUNITY)
Admission: RE | Admit: 2018-06-26 | Discharge: 2018-06-27 | Disposition: A | Discharge status: Home / Self Care | Payer: Medicare Other | Attending: Neurological Surgery | Admitting: Neurological Surgery

## 2018-06-26 ENCOUNTER — Ambulatory Visit (HOSPITAL_COMMUNITY): Payer: Medicare Other

## 2018-06-26 ENCOUNTER — Other Ambulatory Visit: Payer: Self-pay

## 2018-06-26 DIAGNOSIS — G96198 Other disorders of meninges, not elsewhere classified: Secondary | ICD-10-CM

## 2018-06-26 DIAGNOSIS — N184 Chronic kidney disease, stage 4 (severe): Secondary | ICD-10-CM | POA: Insufficient documentation

## 2018-06-26 DIAGNOSIS — M1990 Unspecified osteoarthritis, unspecified site: Secondary | ICD-10-CM | POA: Insufficient documentation

## 2018-06-26 DIAGNOSIS — I509 Heart failure, unspecified: Secondary | ICD-10-CM | POA: Diagnosis not present

## 2018-06-26 DIAGNOSIS — E1122 Type 2 diabetes mellitus with diabetic chronic kidney disease: Secondary | ICD-10-CM | POA: Insufficient documentation

## 2018-06-26 DIAGNOSIS — Z982 Presence of cerebrospinal fluid drainage device: Secondary | ICD-10-CM | POA: Diagnosis not present

## 2018-06-26 DIAGNOSIS — G910 Communicating hydrocephalus: Secondary | ICD-10-CM | POA: Diagnosis not present

## 2018-06-26 DIAGNOSIS — G960 Cerebrospinal fluid leak: Secondary | ICD-10-CM | POA: Insufficient documentation

## 2018-06-26 DIAGNOSIS — D333 Benign neoplasm of cranial nerves: Secondary | ICD-10-CM | POA: Insufficient documentation

## 2018-06-26 DIAGNOSIS — I130 Hypertensive heart and chronic kidney disease with heart failure and stage 1 through stage 4 chronic kidney disease, or unspecified chronic kidney disease: Secondary | ICD-10-CM | POA: Insufficient documentation

## 2018-06-26 DIAGNOSIS — E119 Type 2 diabetes mellitus without complications: Secondary | ICD-10-CM | POA: Diagnosis not present

## 2018-06-26 DIAGNOSIS — E1140 Type 2 diabetes mellitus with diabetic neuropathy, unspecified: Secondary | ICD-10-CM | POA: Diagnosis not present

## 2018-06-26 DIAGNOSIS — G9619 Other disorders of meninges, not elsewhere classified: Secondary | ICD-10-CM | POA: Diagnosis not present

## 2018-06-26 HISTORY — PX: SHUNT REVISION: SHX343

## 2018-06-26 HISTORY — DX: Personal history of other medical treatment: Z92.89

## 2018-06-26 LAB — BASIC METABOLIC PANEL
Anion gap: 9 (ref 5–15)
BUN: 35 mg/dL — ABNORMAL HIGH (ref 8–23)
CO2: 22 mmol/L (ref 22–32)
Calcium: 10.2 mg/dL (ref 8.9–10.3)
Chloride: 108 mmol/L (ref 98–111)
Creatinine, Ser: 2.56 mg/dL — ABNORMAL HIGH (ref 0.44–1.00)
GFR calc Af Amer: 21 mL/min — ABNORMAL LOW (ref >60)
GFR calc non Af Amer: 18 mL/min — ABNORMAL LOW (ref >60)
Glucose, Bld: 96 mg/dL (ref 70–99)
POTASSIUM: 4.2 mmol/L (ref 3.5–5.1)
Sodium: 139 mmol/L (ref 135–145)

## 2018-06-26 LAB — GLUCOSE, CAPILLARY
Glucose-Capillary: 82 mg/dL (ref 70–99)
Glucose-Capillary: 88 mg/dL (ref 70–99)

## 2018-06-26 LAB — CBC
HCT: 39.7 % (ref 36.0–46.0)
Hemoglobin: 12.5 g/dL (ref 12.0–15.0)
MCH: 30 pg (ref 26.0–34.0)
MCHC: 31.5 g/dL (ref 30.0–36.0)
MCV: 95.2 fL (ref 80.0–100.0)
Platelets: 188 10*3/uL (ref 150–400)
RBC: 4.17 MIL/uL (ref 3.87–5.11)
RDW: 12.6 % (ref 11.5–15.5)
WBC: 8 10*3/uL (ref 4.0–10.5)
nRBC: 0 % (ref 0.0–0.2)

## 2018-06-26 LAB — HEMOGLOBIN A1C
Hgb A1c MFr Bld: 5.4 % (ref 4.8–5.6)
Mean Plasma Glucose: 108.28 mg/dL

## 2018-06-26 SURGERY — SHUNT REVISION
Anesthesia: General | Laterality: Left

## 2018-06-26 MED ORDER — FENTANYL CITRATE (PF) 100 MCG/2ML IJ SOLN: 25.0000 ug | INTRAMUSCULAR | Status: DC | PRN

## 2018-06-26 MED ORDER — BUPIVACAINE HCL (PF) 0.5 % IJ SOLN
INTRAMUSCULAR | Status: DC | PRN
  Administered 2018-06-26: 3 mL
  Administered 2018-06-26: 5 mL

## 2018-06-26 MED ORDER — BUPIVACAINE HCL (PF) 0.5 % IJ SOLN
INTRAMUSCULAR | Status: AC
  Filled 2018-06-26: qty 30

## 2018-06-26 MED ORDER — FENTANYL CITRATE (PF) 250 MCG/5ML IJ SOLN
INTRAMUSCULAR | Status: AC
  Filled 2018-06-26: qty 5

## 2018-06-26 MED ORDER — ONDANSETRON HCL 4 MG/2ML IJ SOLN
INTRAMUSCULAR | Status: AC
  Filled 2018-06-26: qty 2

## 2018-06-26 MED ORDER — IRBESARTAN 150 MG PO TABS
150.0000 mg | ORAL_TABLET | Freq: Every day | ORAL | Status: DC
  Administered 2018-06-27: 150 mg via ORAL
  Filled 2018-06-26: qty 1

## 2018-06-26 MED ORDER — ACETAMINOPHEN 500 MG PO TABS: 1000.0000 mg | ORAL_TABLET | Freq: Once | ORAL | Status: DC

## 2018-06-26 MED ORDER — VITAMIN D 25 MCG (1000 UNIT) PO TABS
4000.0000 [IU] | ORAL_TABLET | Freq: Every day | ORAL | Status: DC
  Administered 2018-06-27: 4000 [IU] via ORAL
  Filled 2018-06-26 (×2): qty 4

## 2018-06-26 MED ORDER — ACETAMINOPHEN 325 MG PO TABS: 650.0000 mg | ORAL_TABLET | ORAL | Status: DC | PRN

## 2018-06-26 MED ORDER — DOCUSATE SODIUM 100 MG PO CAPS
100.0000 mg | ORAL_CAPSULE | Freq: Two times a day (BID) | ORAL | Status: DC
  Administered 2018-06-26 – 2018-06-27 (×2): 100 mg via ORAL
  Filled 2018-06-26 (×2): qty 1

## 2018-06-26 MED ORDER — ACETAMINOPHEN 650 MG RE SUPP: 650.0000 mg | RECTAL | Status: DC | PRN

## 2018-06-26 MED ORDER — ROCURONIUM BROMIDE 50 MG/5ML IV SOSY
PREFILLED_SYRINGE | INTRAVENOUS | Status: AC
  Filled 2018-06-26: qty 20

## 2018-06-26 MED ORDER — POLYETHYLENE GLYCOL 3350 17 G PO PACK: 17.0000 g | PACK | Freq: Every day | ORAL | Status: DC | PRN

## 2018-06-26 MED ORDER — BACITRACIN ZINC 500 UNIT/GM EX OINT
TOPICAL_OINTMENT | CUTANEOUS | Status: DC | PRN
  Administered 2018-06-26: 1 via TOPICAL

## 2018-06-26 MED ORDER — MIRTAZAPINE 15 MG PO TABS
7.5000 mg | ORAL_TABLET | Freq: Every day | ORAL | Status: DC
  Administered 2018-06-26: 7.5 mg via ORAL
  Filled 2018-06-26: qty 1

## 2018-06-26 MED ORDER — CARVEDILOL 12.5 MG PO TABS
12.5000 mg | ORAL_TABLET | Freq: Every day | ORAL | Status: DC
  Administered 2018-06-27: 12.5 mg via ORAL
  Filled 2018-06-26: qty 1

## 2018-06-26 MED ORDER — AMLODIPINE BESYLATE 5 MG PO TABS
5.0000 mg | ORAL_TABLET | Freq: Every evening | ORAL | Status: DC
  Administered 2018-06-26: 5 mg via ORAL
  Filled 2018-06-26: qty 1

## 2018-06-26 MED ORDER — LIDOCAINE 2% (20 MG/ML) 5 ML SYRINGE
INTRAMUSCULAR | Status: AC
  Filled 2018-06-26: qty 5

## 2018-06-26 MED ORDER — CYANOCOBALAMIN 500 MCG PO TABS
500.0000 ug | ORAL_TABLET | Freq: Every day | ORAL | Status: DC
  Administered 2018-06-27: 500 ug via ORAL
  Filled 2018-06-26: qty 1

## 2018-06-26 MED ORDER — ONDANSETRON HCL 4 MG/2ML IJ SOLN: 4.0000 mg | Freq: Once | INTRAMUSCULAR | Status: DC | PRN

## 2018-06-26 MED ORDER — NAPHAZOLINE-GLYCERIN 0.012-0.2 % OP SOLN
1.0000 [drp] | Freq: Four times a day (QID) | OPHTHALMIC | Status: DC
  Administered 2018-06-26 – 2018-06-27 (×2): 1 [drp] via OPHTHALMIC
  Filled 2018-06-26: qty 15

## 2018-06-26 MED ORDER — BACITRACIN ZINC 500 UNIT/GM EX OINT
TOPICAL_OINTMENT | CUTANEOUS | Status: AC
  Filled 2018-06-26: qty 28.35

## 2018-06-26 MED ORDER — PROMETHAZINE HCL 25 MG PO TABS: 12.5000 mg | ORAL_TABLET | ORAL | Status: DC | PRN

## 2018-06-26 MED ORDER — 0.9 % SODIUM CHLORIDE (POUR BTL) OPTIME
TOPICAL | Status: DC | PRN
  Administered 2018-06-26: 1000 mL

## 2018-06-26 MED ORDER — FENTANYL CITRATE (PF) 100 MCG/2ML IJ SOLN
INTRAMUSCULAR | Status: DC | PRN
  Administered 2018-06-26 (×4): 25 ug via INTRAVENOUS
  Administered 2018-06-26: 100 ug via INTRAVENOUS
  Administered 2018-06-26 (×2): 25 ug via INTRAVENOUS

## 2018-06-26 MED ORDER — ONDANSETRON HCL 4 MG PO TABS: 4.0000 mg | ORAL_TABLET | ORAL | Status: DC | PRN

## 2018-06-26 MED ORDER — DEXAMETHASONE SODIUM PHOSPHATE 10 MG/ML IJ SOLN
INTRAMUSCULAR | Status: DC | PRN
  Administered 2018-06-26: 5 mg via INTRAVENOUS

## 2018-06-26 MED ORDER — CEFAZOLIN SODIUM-DEXTROSE 2-3 GM-%(50ML) IV SOLR
INTRAVENOUS | Status: DC | PRN
  Administered 2018-06-26: 2 g via INTRAVENOUS

## 2018-06-26 MED ORDER — ESMOLOL HCL 100 MG/10ML IV SOLN
INTRAVENOUS | Status: DC | PRN
  Administered 2018-06-26: 10 mg via INTRAVENOUS
  Administered 2018-06-26 (×4): 20 mg via INTRAVENOUS

## 2018-06-26 MED ORDER — SODIUM CHLORIDE 0.9 % IV SOLN
INTRAVENOUS | Status: DC | PRN
  Administered 2018-06-26: 50 ug/min via INTRAVENOUS

## 2018-06-26 MED ORDER — THROMBIN 5000 UNITS EX SOLR
CUTANEOUS | Status: AC
  Filled 2018-06-26: qty 10000

## 2018-06-26 MED ORDER — PROPOFOL 10 MG/ML IV BOLUS
INTRAVENOUS | Status: DC | PRN
  Administered 2018-06-26: 20 mg via INTRAVENOUS
  Administered 2018-06-26: 30 mg via INTRAVENOUS
  Administered 2018-06-26: 150 mg via INTRAVENOUS

## 2018-06-26 MED ORDER — ONDANSETRON HCL 4 MG/2ML IJ SOLN: 4.0000 mg | INTRAMUSCULAR | Status: DC | PRN

## 2018-06-26 MED ORDER — SODIUM CHLORIDE 0.9 % IV SOLN
INTRAVENOUS | Status: DC
  Administered 2018-06-26: 13:00:00 via INTRAVENOUS

## 2018-06-26 MED ORDER — METOPROLOL TARTRATE 5 MG/5ML IV SOLN
INTRAVENOUS | Status: AC
  Filled 2018-06-26: qty 5

## 2018-06-26 MED ORDER — LACTATED RINGERS IV SOLN
INTRAVENOUS | Status: DC | PRN
  Administered 2018-06-26: 14:00:00 via INTRAVENOUS

## 2018-06-26 MED ORDER — DEXAMETHASONE SODIUM PHOSPHATE 10 MG/ML IJ SOLN
INTRAMUSCULAR | Status: AC
  Filled 2018-06-26: qty 1

## 2018-06-26 MED ORDER — LIDOCAINE 20MG/ML (2%) 15 ML SYRINGE OPTIME
INTRAMUSCULAR | Status: DC | PRN
  Administered 2018-06-26 (×2): 60 mg via INTRAVENOUS

## 2018-06-26 MED ORDER — PROPOFOL 10 MG/ML IV BOLUS
INTRAVENOUS | Status: AC
  Filled 2018-06-26: qty 20

## 2018-06-26 MED ORDER — ROCURONIUM BROMIDE 10 MG/ML (PF) SYRINGE
PREFILLED_SYRINGE | INTRAVENOUS | Status: DC | PRN
  Administered 2018-06-26: 20 mg via INTRAVENOUS
  Administered 2018-06-26: 50 mg via INTRAVENOUS

## 2018-06-26 MED ORDER — GLIMEPIRIDE 1 MG PO TABS
1.0000 mg | ORAL_TABLET | Freq: Every day | ORAL | Status: DC
  Administered 2018-06-27: 1 mg via ORAL
  Filled 2018-06-26: qty 1

## 2018-06-26 MED ORDER — ONDANSETRON HCL 4 MG/2ML IJ SOLN
INTRAMUSCULAR | Status: DC | PRN
  Administered 2018-06-26: 4 mg via INTRAVENOUS

## 2018-06-26 MED ORDER — SUGAMMADEX SODIUM 200 MG/2ML IV SOLN
INTRAVENOUS | Status: DC | PRN
  Administered 2018-06-26: 377.2 mg via INTRAVENOUS

## 2018-06-26 MED ORDER — SUGAMMADEX SODIUM 500 MG/5ML IV SOLN
INTRAVENOUS | Status: AC
  Filled 2018-06-26: qty 5

## 2018-06-26 MED ORDER — METOPROLOL TARTRATE 5 MG/5ML IV SOLN
5.0000 mg | INTRAVENOUS | Status: DC | PRN
  Administered 2018-06-26: 5 mg via INTRAVENOUS

## 2018-06-26 MED ORDER — HYDROCODONE-ACETAMINOPHEN 5-325 MG PO TABS: 1.0000 | ORAL_TABLET | ORAL | Status: DC | PRN

## 2018-06-26 SURGICAL SUPPLY — 74 items
BLADE CLIPPER SURG (BLADE) ×6 IMPLANT
BLADE SURG 10 STRL SS (BLADE) ×3 IMPLANT
BLADE SURG 11 STRL SS (BLADE) ×3 IMPLANT
BLADE SURG 15 STRL LF DISP TIS (BLADE) ×1 IMPLANT
BLADE SURG 15 STRL SS (BLADE) ×2
BOOT SUTURE AID YELLOW STND (SUTURE) IMPLANT
BUR ACORN 6.0 PRECISION (BURR) IMPLANT
BUR ACORN 6.0MM PRECISION (BURR)
CABLE BIPOLOR RESECTION CORD (MISCELLANEOUS) ×3 IMPLANT
CANISTER SUCT 3000ML PPV (MISCELLANEOUS) ×3 IMPLANT
CARTRIDGE OIL MAESTRO DRILL (MISCELLANEOUS) ×1 IMPLANT
CATH PERITONEAL W/RADIOSTRIP (CATHETERS) ×3 IMPLANT
CLIP RANEY DISP (INSTRUMENTS) IMPLANT
CLOSURE WOUND 1/4X4 (GAUZE/BANDAGES/DRESSINGS)
COVER MAYO STAND STRL (DRAPES) ×3 IMPLANT
COVER WAND RF STERILE (DRAPES) ×3 IMPLANT
DECANTER SPIKE VIAL GLASS SM (MISCELLANEOUS) ×3 IMPLANT
DERMABOND ADVANCED (GAUZE/BANDAGES/DRESSINGS) ×2
DERMABOND ADVANCED .7 DNX12 (GAUZE/BANDAGES/DRESSINGS) ×1 IMPLANT
DIFFUSER DRILL AIR PNEUMATIC (MISCELLANEOUS) ×3 IMPLANT
DRAPE HALF SHEET 40X57 (DRAPES) ×3 IMPLANT
DRAPE INCISE IOBAN 66X45 STRL (DRAPES) ×3 IMPLANT
DRAPE ORTHO SPLIT 77X108 STRL (DRAPES) ×2
DRAPE POUCH INSTRU U-SHP 10X18 (DRAPES) ×3 IMPLANT
DRAPE SURG ORHT 6 SPLT 77X108 (DRAPES) ×1 IMPLANT
DURAPREP 26ML APPLICATOR (WOUND CARE) ×6 IMPLANT
ELECT CAUTERY BLADE 6.4 (BLADE) ×3 IMPLANT
ELECT REM PT RETURN 9FT ADLT (ELECTROSURGICAL) ×3
ELECTRODE REM PT RTRN 9FT ADLT (ELECTROSURGICAL) ×1 IMPLANT
GAUZE 4X4 16PLY RFD (DISPOSABLE) ×6 IMPLANT
GLOVE BIO SURGEON STRL SZ7 (GLOVE) IMPLANT
GLOVE BIOGEL PI IND STRL 7.0 (GLOVE) IMPLANT
GLOVE BIOGEL PI INDICATOR 7.0 (GLOVE)
GLOVE ECLIPSE 7.0 STRL STRAW (GLOVE) ×3 IMPLANT
GLOVE EXAM NITRILE XL STR (GLOVE) IMPLANT
GLOVE INDICATOR 7.5 STRL GRN (GLOVE) IMPLANT
GOWN STRL REUS W/ TWL LRG LVL3 (GOWN DISPOSABLE) ×2 IMPLANT
GOWN STRL REUS W/ TWL XL LVL3 (GOWN DISPOSABLE) IMPLANT
GOWN STRL REUS W/TWL 2XL LVL3 (GOWN DISPOSABLE) IMPLANT
GOWN STRL REUS W/TWL LRG LVL3 (GOWN DISPOSABLE) ×4
GOWN STRL REUS W/TWL XL LVL3 (GOWN DISPOSABLE)
HEMOSTAT SURGICEL 2X14 (HEMOSTASIS) IMPLANT
KIT BASIN OR (CUSTOM PROCEDURE TRAY) ×3 IMPLANT
KIT TURNOVER KIT B (KITS) ×3 IMPLANT
MARKER SKIN DUAL TIP RULER LAB (MISCELLANEOUS) ×3 IMPLANT
NEEDLE HYPO 25X1 1.5 SAFETY (NEEDLE) ×3 IMPLANT
NS IRRIG 1000ML POUR BTL (IV SOLUTION) ×3 IMPLANT
OIL CARTRIDGE MAESTRO DRILL (MISCELLANEOUS) ×3
PACK EENT II TURBAN DRAPE (CUSTOM PROCEDURE TRAY) ×3 IMPLANT
PAD ARMBOARD 7.5X6 YLW CONV (MISCELLANEOUS) ×9 IMPLANT
PASSER CATH 65CM DISP (NEUROSURGERY SUPPLIES) ×3 IMPLANT
PATTIES SURGICAL .5 X.5 (GAUZE/BANDAGES/DRESSINGS) IMPLANT
PENCIL BUTTON HOLSTER BLD 10FT (ELECTRODE) ×3 IMPLANT
SPONGE LAP 4X18 RFD (DISPOSABLE) ×3 IMPLANT
SPONGE SURGIFOAM ABS GEL 12-7 (HEMOSTASIS) IMPLANT
STAPLER SKIN PROX WIDE 3.9 (STAPLE) ×3 IMPLANT
STRIP CLOSURE SKIN 1/4X4 (GAUZE/BANDAGES/DRESSINGS) IMPLANT
SUT BONE WAX W31G (SUTURE) ×3 IMPLANT
SUT ETHILON 3 0 PS 1 (SUTURE) ×3 IMPLANT
SUT NURALON 4 0 TR CR/8 (SUTURE) IMPLANT
SUT SILK 0 TIES 10X30 (SUTURE) ×3 IMPLANT
SUT SILK 3 0 SH 30 (SUTURE) IMPLANT
SUT VIC AB 2-0 CT2 18 VCP726D (SUTURE) ×3 IMPLANT
SUT VIC AB 3-0 SH 8-18 (SUTURE) ×3 IMPLANT
SYR BULB 3OZ (MISCELLANEOUS) ×3 IMPLANT
SYR CONTROL 10ML LL (SYRINGE) ×3 IMPLANT
TOWEL GREEN STERILE (TOWEL DISPOSABLE) ×3 IMPLANT
TOWEL GREEN STERILE FF (TOWEL DISPOSABLE) ×3 IMPLANT
TRAY LUMBAR PUNCTURE AD (SET/KITS/TRAYS/PACK) ×3 IMPLANT
TUBE CONNECTING 12'X1/4 (SUCTIONS) ×1
TUBE CONNECTING 12X1/4 (SUCTIONS) ×2 IMPLANT
UNDERPAD 30X30 (UNDERPADS AND DIAPERS) ×3 IMPLANT
VALVE PROGRAM CERTAS SM ANTI (Valve) ×3 IMPLANT
WATER STERILE IRR 1000ML POUR (IV SOLUTION) ×3 IMPLANT

## 2018-06-26 NOTE — Anesthesia Procedure Notes (Signed)
Procedure Name: Intubation Date/Time: 06/26/2018 2:08 PM Performed by: Lowella Dell, CRNA Pre-anesthesia Checklist: Patient identified, Emergency Drugs available, Suction available and Patient being monitored Patient Re-evaluated:Patient Re-evaluated prior to induction Oxygen Delivery Method: Circle System Utilized Preoxygenation: Pre-oxygenation with 100% oxygen Induction Type: IV induction Ventilation: Mask ventilation without difficulty Laryngoscope Size: Mac and 3 Grade View: Grade I Tube type: Oral Tube size: 7.0 mm Number of attempts: 1 Airway Equipment and Method: Stylet Placement Confirmation: ETT inserted through vocal cords under direct vision,  positive ETCO2 and breath sounds checked- equal and bilateral Secured at: 21 cm Tube secured with: Tape Dental Injury: Teeth and Oropharynx as per pre-operative assessment

## 2018-06-26 NOTE — Discharge Instructions (Addendum)
Discharge Instructions  No restriction in activities, slowly increase your activity back to normal.   Your incision is closed with non-absorbable sutures. These will be removed at your 2 week follow up visit. If they become bothersome or cause discomfort, apply some antibiotic ointment like bacitracin or neosporin on the sutures. This will soften them up and usually makes them more comfortable.  Okay to shower on the day of discharge. Be gentle when cleaning your incision. Use regular soap and water. If that is uncomfortable, try using baby shampoo. Do not submerge the wound under water for 2 weeks after surgery.  Follow up with Dr. Zada Finders in 2 weeks after discharge. If you do not already have a discharge appointment, please call his office at [phone] to schedule a follow up appointment. If you have any concerns or questions, please call the office and let us know.

## 2018-06-26 NOTE — Op Note (Signed)
PATIENT: Natalie Friedman  DAY OF SURGERY: 06/26/18   PRE-OPERATIVE DIAGNOSIS:  Communicating hydrocephalus   POST-OPERATIVE DIAGNOSIS:  Communicating hydrocephalus   PROCEDURE:  Left ventriculoperitoneal shunt revision   SURGEON:  Surgeon(s) and Role:    Judith Part, MD - Primary   ANESTHESIA: ETGA   BRIEF HISTORY: This is a 68 year old woman with prior retrosigmoid resection of a vestibular schwannoma who then had CSF leakage and symptoms of hydrocephalus. I placed a shunt previously with improvement, however she regressed and began having symptoms along with CSF leakage again. I therefore recommended shunt exploration and revision. This was discussed with the patient and her husband as well as risks, benefits, and alternatives and wished to proceed with surgical treatment.   OPERATIVE DETAIL: The patient was taken to the operating room and placed on the OR table in the supine position. A formal time out was performed with two patient identifiers and confirmed the operative site. Anesthesia was induced by the anesthesia team. The head was turned contralaterally and a bump was placed under the ipsilateral shoulder to assist with tunneling. The operative sites were marked, hair was clipped with surgical clippers, and the area was then prepped and draped in a sterile fashion along the entire length of the shunt system. The prior scalp incision was reopened and the proximal hardware was exposed. The proximal catheter was disconnected from the valve and there was good flow, however there was a small leak in the side of the catheter as it connected to the valve. The distal catheter was disconnected and connected to a manometer, which showed very poor runoff. After flushing, runoff was still sluggish, so the abdominal incision was opened and prior catheter was dissected and removed. A new distal catheter was tunneled using a single post-auricular relay site. The proximal system was reconnected and  flow was still poor, concerning for a valve malfunction. The valve and proximal catheter were replaced and reconnected with good spontaneous flow. The distal catheter was then replaced into the abdomen, the incisions were irrigated with antibiotic irrigation, all counts were correct, and the incisions were closed in layers. The patient was then returned to anesthesia for emergence. No apparent complications at the completion of the procedure.  IMPLANTS: Codman Certas shunt valve, set to performance level 2.0   EBL:  23mL   DRAINS: none   SPECIMENS: none   Judith Part, MD 06/26/18 4:38 PM

## 2018-06-26 NOTE — Brief Op Note (Signed)
06/26/2018  4:35 PM  PATIENT:  Natalie Friedman  68 y.o. female  PRE-OPERATIVE DIAGNOSIS:  Communicating hydrocephalus  POST-OPERATIVE DIAGNOSIS:  Communicating hydrocephalus  PROCEDURE:  Procedure(s) with comments: Left ventriculoperitoneal shunt revision (Left) - Left ventriculoperitoneal shunt revision  SURGEON:  Surgeon(s) and Role:    * Judith Part, MD - Primary  PHYSICIAN ASSISTANT:   ASSISTANTS: none   ANESTHESIA:   general  EBL:  25 mL   BLOOD ADMINISTERED:none  DRAINS: none   LOCAL MEDICATIONS USED:  LIDOCAINE   SPECIMEN:  No Specimen  DISPOSITION OF SPECIMEN:  N/A  COUNTS:  YES  TOURNIQUET:  * No tourniquets in log *  DICTATION: .Note written in EPIC  PLAN OF CARE: Discharge to home after PACU  PATIENT DISPOSITION:  PACU - hemodynamically stable.   Delay start of Pharmacological VTE agent (>24hrs) due to surgical blood loss or risk of bleeding: yes

## 2018-06-26 NOTE — Anesthesia Postprocedure Evaluation (Signed)
Anesthesia Post Note  Patient: Natalie Friedman  Procedure(s) Performed: Left ventriculoperitoneal shunt revision (Left )     Patient location during evaluation: PACU Anesthesia Type: General Level of consciousness: lethargic and patient cooperative Pain management: pain level controlled Vital Signs Assessment: post-procedure vital signs reviewed and stable Respiratory status: spontaneous breathing, nonlabored ventilation, respiratory function stable and patient connected to nasal cannula oxygen Cardiovascular status: blood pressure returned to baseline and stable Postop Assessment: no apparent nausea or vomiting Anesthetic complications: no    Last Vitals:  Vitals:   06/26/18 1846 06/26/18 1902  BP:  (!) 159/105  Pulse: (!) 160 72  Resp:  14  Temp:  (!) 36.4 C  SpO2:  100%    Last Pain:  Vitals:   06/26/18 1902  TempSrc: Oral  PainSc:                  Kristen Fromm COKER

## 2018-06-26 NOTE — Anesthesia Preprocedure Evaluation (Addendum)
Anesthesia Evaluation  Patient identified by MRN, date of birth, ID band Patient awake    Reviewed: Allergy & Precautions, NPO status , Patient's Chart, lab work & pertinent test results, reviewed documented beta blocker date and time   Airway Mallampati: III  TM Distance: >3 FB Neck ROM: Full    Dental  (+) Poor Dentition, Loose, Missing, Edentulous Upper   Pulmonary neg pulmonary ROS,    Pulmonary exam normal breath sounds clear to auscultation       Cardiovascular hypertension, Pt. on medications and Pt. on home beta blockers +CHF  Normal cardiovascular exam Rhythm:Regular Rate:Normal  ECG: Rate 86. Sinus rhythm with marked sinus arrhythmia. Left axis deviation  ECHO: LV EF: 50% -   55%   Neuro/Psych PSYCHIATRIC DISORDERS Anxiety Depression Occult spina bifida  Neuromuscular disease    GI/Hepatic negative GI ROS, Neg liver ROS,   Endo/Other  diabetes, Oral Hypoglycemic Agents  Renal/GU CRFRenal disease     Musculoskeletal negative musculoskeletal ROS (+)   Abdominal (+) + obese,   Peds  Hematology negative hematology ROS (+)   Anesthesia Other Findings Communicating hydrocephalus  Reproductive/Obstetrics                            Anesthesia Physical Anesthesia Plan  ASA: III  Anesthesia Plan: General   Post-op Pain Management:    Induction: Intravenous  PONV Risk Score and Plan: 3 and Ondansetron, Dexamethasone and Treatment may vary due to age or medical condition  Airway Management Planned: Oral ETT  Additional Equipment:   Intra-op Plan:   Post-operative Plan: Extubation in OR  Informed Consent: I have reviewed the patients History and Physical, chart, labs and discussed the procedure including the risks, benefits and alternatives for the proposed anesthesia with the patient or authorized representative who has indicated his/her understanding and acceptance.      Dental advisory given  Plan Discussed with: CRNA  Anesthesia Plan Comments:         Anesthesia Quick Evaluation

## 2018-06-26 NOTE — Transfer of Care (Signed)
Immediate Anesthesia Transfer of Care Note  Patient: Natalie Friedman  Procedure(s) Performed: Left ventriculoperitoneal shunt revision (Left )  Patient Location: PACU  Anesthesia Type:General  Level of Consciousness: drowsy  Airway & Oxygen Therapy: Patient Spontanous Breathing and Patient connected to face mask oxygen  Post-op Assessment: Report given to RN and Post -op Vital signs reviewed and stable  Post vital signs: Reviewed and stable  Last Vitals:  Vitals Value Taken Time  BP 149/99 06/26/2018  4:46 PM  Temp    Pulse 59 06/26/2018  4:47 PM  Resp 19 06/26/2018  4:47 PM  SpO2 100 % 06/26/2018  4:47 PM  Vitals shown include unvalidated device data.  Last Pain:  Vitals:   06/26/18 1304  TempSrc: Oral  PainSc: 0-No pain      Patients Stated Pain Goal: 3 (24/81/85 9093)  Complications: No apparent anesthesia complications

## 2018-06-26 NOTE — H&P (Signed)
Surgical H&P Update  HPI: 68 y.o. 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 She had a shunt placed but the pseudomeningocele recurred, concerning for shunt malfunction. No changes in health since she was last seen. Still having CSF leakage with confusion and wishes to proceed with surgery.  PMHx:  Past Medical History:  Diagnosis Date  . Anxiety   . Arthritis   . CHF (congestive heart failure) (HCC)    was a result of Cotiorsteroids  . Chronic kidney disease    stage IV  . Depression   . Diabetes mellitus without complication ([place])    Type II  . History of blood transfusion    ecotic pregnacy  . Hypertension   . Neuropathy   . Occult spina bifida    FamHx:  Family History  Problem Relation Age of Onset  . Cancer Mother        colon  . Cancer - Colon Father    SocHx:  reports that she has never smoked. She has never used smokeless tobacco. She reports that she does not drink alcohol or use drugs.  Physical Exam: AOx3, PERRL, R FD, TM  Strength 5/5 x4, SILTx4  Assesment/Plan: 68 y.o. woman with communicating hydrocephalus & CSF leak, here for shunt revision. Risks, benefits, and alternatives discussed and the patient would like to continue with surgery.  -OR for shunt revision -CTH from PACU post-op then discharge home   Judith Part, MD 06/26/18 1:30 PM

## 2018-06-27 ENCOUNTER — Encounter (HOSPITAL_COMMUNITY): Payer: Self-pay | Admitting: Neurological Surgery

## 2018-06-27 ENCOUNTER — Telehealth: Payer: Self-pay

## 2018-06-27 DIAGNOSIS — G9619 Other disorders of meninges, not elsewhere classified: Secondary | ICD-10-CM | POA: Diagnosis not present

## 2018-06-27 DIAGNOSIS — E1122 Type 2 diabetes mellitus with diabetic chronic kidney disease: Secondary | ICD-10-CM | POA: Diagnosis not present

## 2018-06-27 DIAGNOSIS — N184 Chronic kidney disease, stage 4 (severe): Secondary | ICD-10-CM | POA: Diagnosis not present

## 2018-06-27 DIAGNOSIS — I509 Heart failure, unspecified: Secondary | ICD-10-CM | POA: Diagnosis not present

## 2018-06-27 DIAGNOSIS — E1140 Type 2 diabetes mellitus with diabetic neuropathy, unspecified: Secondary | ICD-10-CM | POA: Diagnosis not present

## 2018-06-27 DIAGNOSIS — M1990 Unspecified osteoarthritis, unspecified site: Secondary | ICD-10-CM | POA: Diagnosis not present

## 2018-06-27 DIAGNOSIS — I130 Hypertensive heart and chronic kidney disease with heart failure and stage 1 through stage 4 chronic kidney disease, or unspecified chronic kidney disease: Secondary | ICD-10-CM | POA: Diagnosis not present

## 2018-06-27 DIAGNOSIS — G910 Communicating hydrocephalus: Secondary | ICD-10-CM | POA: Diagnosis not present

## 2018-06-27 DIAGNOSIS — G960 Cerebrospinal fluid leak: Secondary | ICD-10-CM | POA: Diagnosis not present

## 2018-06-27 DIAGNOSIS — D333 Benign neoplasm of cranial nerves: Secondary | ICD-10-CM | POA: Diagnosis not present

## 2018-06-27 NOTE — Plan of Care (Signed)
Incisions intact, no new drainage. Pt going home with husband as primary caregiver. Husband states pt is much more alert now than when she came in for the surgery. Pt able to care for pt, no further needs.

## 2018-06-27 NOTE — Telephone Encounter (Signed)
Thank you :)

## 2018-06-27 NOTE — Progress Notes (Signed)
Neurosurgery Service Progress Note  Subjective: No acute events overnight, no leakage from incision or staining on pillow   Objective: Vitals:   06/26/18 1902 06/27/18 0018 06/27/18 0351 06/27/18 0849  BP: (!) 159/105 (!) 155/87 (!) 150/96 (!) 154/102  Pulse: 72 68 67 79  Resp: 14 16 16 17   Temp: (!) 97.5 F (36.4 C) 98.4 F (36.9 C) 98 F (36.7 C) 98.2 F (36.8 C)  TempSrc: Oral Axillary Axillary Oral  SpO2: 100% 100% 100% 98%  Weight:      Height:       Temp (24hrs), Avg:97.9 F (36.6 C), Min:97.3 F (36.3 C), Max:98.4 F (36.9 C)  CBC Latest Ref Rng & Units 06/26/2018 06/07/2018 06/03/2018  WBC 4.0 - 10.5 K/uL 8.0 6.2 7.9  Hemoglobin 12.0 - 15.0 g/dL 12.5 11.6(L) 11.4(L)  Hematocrit 36.0 - 46.0 % 39.7 35.6(L) 34.2(L)  Platelets 150 - 400 K/uL 188 124(L) 159   BMP Latest Ref Rng & Units 06/26/2018 06/07/2018 06/03/2018  Glucose 70 - 99 mg/dL 96 96 124(H)  BUN 8 - 23 mg/dL 35(H) 51(H) 43(H)  Creatinine 0.44 - 1.00 mg/dL 2.56(H) 2.75(H) 2.93(H)  Sodium 135 - 145 mmol/L 139 137 131(L)  Potassium 3.5 - 5.1 mmol/L 4.2 5.2(H) 4.9  Chloride 98 - 111 mmol/L 108 104 96(L)  CO2 22 - 32 mmol/L 22 23 26   Calcium 8.9 - 10.3 mg/dL 10.2 9.8 9.6    Intake/Output Summary (Last 24 hours) at 06/27/2018 0932 Last data filed at 06/27/2018 0600 Gross per 24 hour  Intake 800 ml  Output 25 ml  Net 775 ml    Current Facility-Administered Medications:  .  acetaminophen (TYLENOL) tablet 650 mg, 650 mg, Oral, Q4H PRN **OR** acetaminophen (TYLENOL) suppository 650 mg, 650 mg, Rectal, Q4H PRN, Kingston Shawgo A, MD .  amLODipine (NORVASC) tablet 5 mg, 5 mg, Oral, QPM, Mihika Surrette, Joyice Faster, MD, 5 mg at 06/26/18 2112 .  carvedilol (COREG) tablet 12.5 mg, 12.5 mg, Oral, Daily, Judith Part, MD, 12.5 mg at 06/27/18 0902 .  cholecalciferol (VITAMIN D3) tablet 4,000 Units, 4,000 Units, Oral, Daily, Judith Part, MD, 4,000 Units at 06/27/18 0902 .  docusate sodium (COLACE) capsule 100 mg,  100 mg, Oral, BID, Judith Part, MD, 100 mg at 06/27/18 0902 .  glimepiride (AMARYL) tablet 1 mg, 1 mg, Oral, Q breakfast, Zakirah Weingart, Joyice Faster, MD, 1 mg at 06/27/18 0903 .  HYDROcodone-acetaminophen (NORCO/VICODIN) 5-325 MG per tablet 1 tablet, 1 tablet, Oral, Q4H PRN, Judith Part, MD .  irbesartan (AVAPRO) tablet 150 mg, 150 mg, Oral, Daily, Judith Part, MD, 150 mg at 06/27/18 0902 .  mirtazapine (REMERON) tablet 7.5 mg, 7.5 mg, Oral, QHS, Cuinn Westerhold A, MD, 7.5 mg at 06/26/18 2112 .  naphazoline-glycerin (CLEAR EYES REDNESS) ophth solution 1 drop, 1 drop, Right Eye, QID, Judith Part, MD, 1 drop at 06/27/18 0910 .  ondansetron (ZOFRAN) tablet 4 mg, 4 mg, Oral, Q4H PRN **OR** ondansetron (ZOFRAN) injection 4 mg, 4 mg, Intravenous, Q4H PRN, Javis Abboud A, MD .  polyethylene glycol (MIRALAX / GLYCOLAX) packet 17 g, 17 g, Oral, Daily PRN, Judith Part, MD .  promethazine (PHENERGAN) tablet 12.5-25 mg, 12.5-25 mg, Oral, Q4H PRN, Judith Part, MD .  vitamin B-12 (CYANOCOBALAMIN) tablet 500 mcg, 500 mcg, Oral, Daily, Judith Part, MD, 500 mcg at 06/27/18 9983   Physical Exam: Awake/alert, PERRL, EOMI, +R FD, Strength 5/5 x4, SILTx4 Incisions c/d/i, pseudomeningocele soft  Assessment & Plan:  68 y.o. woman s/p resection of vestibular schwannoma with post-op communicating hydrocephalus with pseudomeningocele and CSF leak, recovering well. -discharge home this morning  Judith Part  06/27/18 9:32 AM

## 2018-06-27 NOTE — Telephone Encounter (Signed)
Sheryl called stating that patients husband has declined [place] visits for the rest of the week due to the coronavirus. Patient had procedure done this week and wants to avoid exposure.

## 2018-06-27 NOTE — Discharge Summary (Signed)
Discharge Summary  Date of Admission: 06/26/2018  Date of Discharge: 06/27/18  Attending Physician: Emelda Brothers, MD  Hospital Course: Patient was admitted for overnight observation after revision of a left VP shunt. She was recovered in PACU and transferred to [facility] overnight. Her pseudomeningocele stopped leaking and was already smaller on POD1. A CTH showed the proximal catheter in good position. Her hospital course was uncomplicated and the patient was discharged home on 06/27/2018. She will follow up in clinic with me in 2 weeks.  Neurologic exam at discharge:  Awake/alert, PERRL, EOMI, +R FD, TM Strength 5/5 x4, SILTx4  Discharge diagnosis: Communicating hydrocephalus  Judith Part, MD 06/27/18 9:30 AM

## 2018-06-27 NOTE — Progress Notes (Addendum)
At 1050 Discharge instructions reviewed with pt and her husband: which her primary caregiver.  Copy of instructions  given to pt/husband.   At 1210  Pt d/c'd via wheelchair with belongings, with husband.           Escorted by unit staff.

## 2018-06-28 DIAGNOSIS — I509 Heart failure, unspecified: Secondary | ICD-10-CM | POA: Diagnosis not present

## 2018-06-28 DIAGNOSIS — Z96651 Presence of right artificial knee joint: Secondary | ICD-10-CM | POA: Diagnosis not present

## 2018-06-28 DIAGNOSIS — Z48811 Encounter for surgical aftercare following surgery on the nervous system: Secondary | ICD-10-CM | POA: Diagnosis not present

## 2018-06-28 DIAGNOSIS — D3610 Benign neoplasm of peripheral nerves and autonomic nervous system, unspecified: Secondary | ICD-10-CM | POA: Diagnosis not present

## 2018-06-28 DIAGNOSIS — E1140 Type 2 diabetes mellitus with diabetic neuropathy, unspecified: Secondary | ICD-10-CM | POA: Diagnosis not present

## 2018-06-28 DIAGNOSIS — N189 Chronic kidney disease, unspecified: Secondary | ICD-10-CM | POA: Diagnosis not present

## 2018-06-28 DIAGNOSIS — Z5181 Encounter for therapeutic drug level monitoring: Secondary | ICD-10-CM | POA: Diagnosis not present

## 2018-06-28 DIAGNOSIS — Q760 Spina bifida occulta: Secondary | ICD-10-CM | POA: Diagnosis not present

## 2018-06-28 DIAGNOSIS — M1991 Primary osteoarthritis, unspecified site: Secondary | ICD-10-CM | POA: Diagnosis not present

## 2018-06-28 DIAGNOSIS — E1122 Type 2 diabetes mellitus with diabetic chronic kidney disease: Secondary | ICD-10-CM | POA: Diagnosis not present

## 2018-07-01 DIAGNOSIS — Z48811 Encounter for surgical aftercare following surgery on the nervous system: Secondary | ICD-10-CM | POA: Diagnosis not present

## 2018-07-01 DIAGNOSIS — Z5181 Encounter for therapeutic drug level monitoring: Secondary | ICD-10-CM | POA: Diagnosis not present

## 2018-07-01 DIAGNOSIS — D3610 Benign neoplasm of peripheral nerves and autonomic nervous system, unspecified: Secondary | ICD-10-CM | POA: Diagnosis not present

## 2018-07-01 DIAGNOSIS — Z96651 Presence of right artificial knee joint: Secondary | ICD-10-CM | POA: Diagnosis not present

## 2018-07-01 DIAGNOSIS — I509 Heart failure, unspecified: Secondary | ICD-10-CM | POA: Diagnosis not present

## 2018-07-01 DIAGNOSIS — Q760 Spina bifida occulta: Secondary | ICD-10-CM | POA: Diagnosis not present

## 2018-07-01 DIAGNOSIS — E1140 Type 2 diabetes mellitus with diabetic neuropathy, unspecified: Secondary | ICD-10-CM | POA: Diagnosis not present

## 2018-07-01 DIAGNOSIS — E1122 Type 2 diabetes mellitus with diabetic chronic kidney disease: Secondary | ICD-10-CM | POA: Diagnosis not present

## 2018-07-01 DIAGNOSIS — N189 Chronic kidney disease, unspecified: Secondary | ICD-10-CM | POA: Diagnosis not present

## 2018-07-01 DIAGNOSIS — M1991 Primary osteoarthritis, unspecified site: Secondary | ICD-10-CM | POA: Diagnosis not present

## 2018-07-03 DIAGNOSIS — N189 Chronic kidney disease, unspecified: Secondary | ICD-10-CM | POA: Diagnosis not present

## 2018-07-03 DIAGNOSIS — Z96651 Presence of right artificial knee joint: Secondary | ICD-10-CM | POA: Diagnosis not present

## 2018-07-03 DIAGNOSIS — M1991 Primary osteoarthritis, unspecified site: Secondary | ICD-10-CM | POA: Diagnosis not present

## 2018-07-03 DIAGNOSIS — E1122 Type 2 diabetes mellitus with diabetic chronic kidney disease: Secondary | ICD-10-CM | POA: Diagnosis not present

## 2018-07-03 DIAGNOSIS — I509 Heart failure, unspecified: Secondary | ICD-10-CM | POA: Diagnosis not present

## 2018-07-03 DIAGNOSIS — Z48811 Encounter for surgical aftercare following surgery on the nervous system: Secondary | ICD-10-CM | POA: Diagnosis not present

## 2018-07-03 DIAGNOSIS — Q760 Spina bifida occulta: Secondary | ICD-10-CM | POA: Diagnosis not present

## 2018-07-03 DIAGNOSIS — Z5181 Encounter for therapeutic drug level monitoring: Secondary | ICD-10-CM | POA: Diagnosis not present

## 2018-07-03 DIAGNOSIS — E1140 Type 2 diabetes mellitus with diabetic neuropathy, unspecified: Secondary | ICD-10-CM | POA: Diagnosis not present

## 2018-07-03 DIAGNOSIS — D3610 Benign neoplasm of peripheral nerves and autonomic nervous system, unspecified: Secondary | ICD-10-CM | POA: Diagnosis not present

## 2018-07-04 DIAGNOSIS — E1122 Type 2 diabetes mellitus with diabetic chronic kidney disease: Secondary | ICD-10-CM | POA: Diagnosis not present

## 2018-07-04 DIAGNOSIS — D3610 Benign neoplasm of peripheral nerves and autonomic nervous system, unspecified: Secondary | ICD-10-CM | POA: Diagnosis not present

## 2018-07-04 DIAGNOSIS — E1140 Type 2 diabetes mellitus with diabetic neuropathy, unspecified: Secondary | ICD-10-CM | POA: Diagnosis not present

## 2018-07-04 DIAGNOSIS — Z48811 Encounter for surgical aftercare following surgery on the nervous system: Secondary | ICD-10-CM | POA: Diagnosis not present

## 2018-07-04 DIAGNOSIS — Q760 Spina bifida occulta: Secondary | ICD-10-CM | POA: Diagnosis not present

## 2018-07-04 DIAGNOSIS — M1991 Primary osteoarthritis, unspecified site: Secondary | ICD-10-CM | POA: Diagnosis not present

## 2018-07-04 DIAGNOSIS — Z96651 Presence of right artificial knee joint: Secondary | ICD-10-CM | POA: Diagnosis not present

## 2018-07-04 DIAGNOSIS — N189 Chronic kidney disease, unspecified: Secondary | ICD-10-CM | POA: Diagnosis not present

## 2018-07-04 DIAGNOSIS — Z5181 Encounter for therapeutic drug level monitoring: Secondary | ICD-10-CM | POA: Diagnosis not present

## 2018-07-04 DIAGNOSIS — I509 Heart failure, unspecified: Secondary | ICD-10-CM | POA: Diagnosis not present

## 2018-07-05 DIAGNOSIS — Q760 Spina bifida occulta: Secondary | ICD-10-CM | POA: Diagnosis not present

## 2018-07-05 DIAGNOSIS — M1991 Primary osteoarthritis, unspecified site: Secondary | ICD-10-CM | POA: Diagnosis not present

## 2018-07-05 DIAGNOSIS — Z5181 Encounter for therapeutic drug level monitoring: Secondary | ICD-10-CM | POA: Diagnosis not present

## 2018-07-05 DIAGNOSIS — I509 Heart failure, unspecified: Secondary | ICD-10-CM | POA: Diagnosis not present

## 2018-07-05 DIAGNOSIS — E1122 Type 2 diabetes mellitus with diabetic chronic kidney disease: Secondary | ICD-10-CM | POA: Diagnosis not present

## 2018-07-05 DIAGNOSIS — E1140 Type 2 diabetes mellitus with diabetic neuropathy, unspecified: Secondary | ICD-10-CM | POA: Diagnosis not present

## 2018-07-05 DIAGNOSIS — Z48811 Encounter for surgical aftercare following surgery on the nervous system: Secondary | ICD-10-CM | POA: Diagnosis not present

## 2018-07-05 DIAGNOSIS — N189 Chronic kidney disease, unspecified: Secondary | ICD-10-CM | POA: Diagnosis not present

## 2018-07-05 DIAGNOSIS — D3610 Benign neoplasm of peripheral nerves and autonomic nervous system, unspecified: Secondary | ICD-10-CM | POA: Diagnosis not present

## 2018-07-05 DIAGNOSIS — Z96651 Presence of right artificial knee joint: Secondary | ICD-10-CM | POA: Diagnosis not present

## 2018-07-06 DIAGNOSIS — Z5181 Encounter for therapeutic drug level monitoring: Secondary | ICD-10-CM | POA: Diagnosis not present

## 2018-07-06 DIAGNOSIS — N189 Chronic kidney disease, unspecified: Secondary | ICD-10-CM | POA: Diagnosis not present

## 2018-07-06 DIAGNOSIS — E1122 Type 2 diabetes mellitus with diabetic chronic kidney disease: Secondary | ICD-10-CM | POA: Diagnosis not present

## 2018-07-06 DIAGNOSIS — D3610 Benign neoplasm of peripheral nerves and autonomic nervous system, unspecified: Secondary | ICD-10-CM | POA: Diagnosis not present

## 2018-07-06 DIAGNOSIS — Z48811 Encounter for surgical aftercare following surgery on the nervous system: Secondary | ICD-10-CM | POA: Diagnosis not present

## 2018-07-06 DIAGNOSIS — I509 Heart failure, unspecified: Secondary | ICD-10-CM | POA: Diagnosis not present

## 2018-07-06 DIAGNOSIS — Z96651 Presence of right artificial knee joint: Secondary | ICD-10-CM | POA: Diagnosis not present

## 2018-07-06 DIAGNOSIS — M1991 Primary osteoarthritis, unspecified site: Secondary | ICD-10-CM | POA: Diagnosis not present

## 2018-07-06 DIAGNOSIS — E1140 Type 2 diabetes mellitus with diabetic neuropathy, unspecified: Secondary | ICD-10-CM | POA: Diagnosis not present

## 2018-07-06 DIAGNOSIS — Q760 Spina bifida occulta: Secondary | ICD-10-CM | POA: Diagnosis not present

## 2018-07-08 DIAGNOSIS — Z48811 Encounter for surgical aftercare following surgery on the nervous system: Secondary | ICD-10-CM | POA: Diagnosis not present

## 2018-07-08 DIAGNOSIS — I5030 Unspecified diastolic (congestive) heart failure: Secondary | ICD-10-CM | POA: Diagnosis not present

## 2018-07-08 DIAGNOSIS — N189 Chronic kidney disease, unspecified: Secondary | ICD-10-CM | POA: Diagnosis not present

## 2018-07-08 DIAGNOSIS — Z5181 Encounter for therapeutic drug level monitoring: Secondary | ICD-10-CM | POA: Diagnosis not present

## 2018-07-08 DIAGNOSIS — Z96651 Presence of right artificial knee joint: Secondary | ICD-10-CM | POA: Diagnosis not present

## 2018-07-08 DIAGNOSIS — M1991 Primary osteoarthritis, unspecified site: Secondary | ICD-10-CM | POA: Diagnosis not present

## 2018-07-08 DIAGNOSIS — E1140 Type 2 diabetes mellitus with diabetic neuropathy, unspecified: Secondary | ICD-10-CM | POA: Diagnosis not present

## 2018-07-08 DIAGNOSIS — I509 Heart failure, unspecified: Secondary | ICD-10-CM | POA: Diagnosis not present

## 2018-07-08 DIAGNOSIS — N179 Acute kidney failure, unspecified: Secondary | ICD-10-CM | POA: Diagnosis not present

## 2018-07-08 DIAGNOSIS — Q760 Spina bifida occulta: Secondary | ICD-10-CM | POA: Diagnosis not present

## 2018-07-08 DIAGNOSIS — D333 Benign neoplasm of cranial nerves: Secondary | ICD-10-CM | POA: Diagnosis not present

## 2018-07-08 DIAGNOSIS — D3610 Benign neoplasm of peripheral nerves and autonomic nervous system, unspecified: Secondary | ICD-10-CM | POA: Diagnosis not present

## 2018-07-08 DIAGNOSIS — E1122 Type 2 diabetes mellitus with diabetic chronic kidney disease: Secondary | ICD-10-CM | POA: Diagnosis not present

## 2018-07-09 ENCOUNTER — Encounter (HOSPITAL_COMMUNITY): Payer: Self-pay | Admitting: *Deleted

## 2018-07-09 ENCOUNTER — Other Ambulatory Visit: Payer: Self-pay

## 2018-07-09 ENCOUNTER — Other Ambulatory Visit: Payer: Self-pay | Admitting: Neurological Surgery

## 2018-07-09 NOTE — Progress Notes (Signed)
It was approved by the AC-William Rice and Anette Riedel that it's an appropriate exception for Mr. Natalie Friedman to be with [name] until he is no longer needed for history in the Short Stay Dept.  He will then have to leave the surgical area.  He can stay in his car, walk around the campus, or leave if he chooses.  The physician will call him post procedure.   I called Mr Natalie Friedman to let him know it was approved for him to be with his wife until he is not needed in Short Stay.

## 2018-07-09 NOTE — Progress Notes (Signed)
Spoke with husband Natalie Friedman regarding his wife's pre-op instructions for DOS 07/10/2018.  Patient is unable to speak right now due to increased cranial pressure.  Husband Natalie Friedman informed for patient to stop now all vitamins, supplement, Ibuprofen/NSAIDS, fish oil, aleve.  May use tylenol if needed.  PCP - Dr. Wende Neighbors Cardiologist - denies  Chest x-ray - 06/10/18 EKG - 06/26/18 Stress Test - denies ECHO - 05/13/16 Cardiac Cath - denies  Sleep Study - denies CPAP - n/a  Fasting Blood Sugar - 130-150's Checks Blood Sugar __3_ times a day   Coronavirus Screening  Have you or your husband Natalie Friedman experienced the following symptoms:  Cough yes/no: No Fever (>100.27F)  yes/no: No Runny nose yes/no: No Sore throat yes/no: No Difficulty breathing/shortness of breath  yes/no: No  Have you or your husband Natalie Friedman family member traveled in the last 14 days and where? yes/no: No  Instructed patient's Husband Natalie Friedman to half her levemir night dose   WHAT DO I DO ABOUT MY DIABETES MEDICATION?  Marland Kitchen Do not take oral diabetes medicines (Glimepiride) the morning of surgery.  . THE NIGHT BEFORE SURGERY, take __5 units of ___levemir_insulin.       . THE MORNING OF SURGERY, take ___0_ units insulin.  How do I manage my blood sugar before surgery? o Check your blood sugar the morning of your surgery when you wake up and every 2 hours until you get to the Short Stay unit. . If your blood sugar is less than 70 mg/dL, you will need to treat for low blood sugar: o Do not take insulin. o Treat a low blood sugar (less than 70 mg/dL) with  cup of clear juice (cranberry or apple), 4 glucose tablets, OR glucose gel. o Recheck blood sugar in 15 minutes after treatment (to make sure it is greater than 70 mg/dL). If your blood sugar is not greater than 70 mg/dL on recheck, call [phone] for further instructions. . Report your blood sugar to the short stay nurse when you get to Short Stay.  Husband Natalie Friedman verbalized  understanding of all pre-op instructions for DOS.  Informed of Visitor Restriction Policy that's currently in place. E-mail sent to Anette Riedel to see if husband can assist patient into hospital tomorrow.  Waiting for response.

## 2018-07-10 ENCOUNTER — Encounter (HOSPITAL_COMMUNITY)
Admission: AD | Disposition: A | Discharge status: Home Health | Payer: Self-pay | Source: Home / Self Care | Attending: Neurological Surgery

## 2018-07-10 ENCOUNTER — Ambulatory Visit (HOSPITAL_COMMUNITY): Payer: Medicare Other | Admitting: Certified Registered Nurse Anesthetist

## 2018-07-10 ENCOUNTER — Inpatient Hospital Stay (HOSPITAL_COMMUNITY)
Admission: AD | Admit: 2018-07-10 | Discharge: 2018-07-22 | DRG: 032 | Disposition: A | Discharge status: Home Health | Payer: Medicare Other | Attending: Neurological Surgery | Admitting: Neurological Surgery

## 2018-07-10 ENCOUNTER — Other Ambulatory Visit: Payer: Self-pay

## 2018-07-10 ENCOUNTER — Encounter (HOSPITAL_COMMUNITY): Payer: Self-pay

## 2018-07-10 ENCOUNTER — Other Ambulatory Visit: Payer: Self-pay | Admitting: Physical Medicine and Rehabilitation

## 2018-07-10 DIAGNOSIS — Q760 Spina bifida occulta: Secondary | ICD-10-CM

## 2018-07-10 DIAGNOSIS — T8501XA Breakdown (mechanical) of ventricular intracranial (communicating) shunt, initial encounter: Secondary | ICD-10-CM | POA: Diagnosis not present

## 2018-07-10 DIAGNOSIS — E1122 Type 2 diabetes mellitus with diabetic chronic kidney disease: Secondary | ICD-10-CM | POA: Diagnosis present

## 2018-07-10 DIAGNOSIS — Z86018 Personal history of other benign neoplasm: Secondary | ICD-10-CM | POA: Diagnosis not present

## 2018-07-10 DIAGNOSIS — T8503XA Leakage of ventricular intracranial (communicating) shunt, initial encounter: Secondary | ICD-10-CM | POA: Diagnosis not present

## 2018-07-10 DIAGNOSIS — R944 Abnormal results of kidney function studies: Secondary | ICD-10-CM | POA: Diagnosis not present

## 2018-07-10 DIAGNOSIS — R339 Retention of urine, unspecified: Secondary | ICD-10-CM | POA: Diagnosis not present

## 2018-07-10 DIAGNOSIS — E1140 Type 2 diabetes mellitus with diabetic neuropathy, unspecified: Secondary | ICD-10-CM | POA: Diagnosis present

## 2018-07-10 DIAGNOSIS — G919 Hydrocephalus, unspecified: Secondary | ICD-10-CM | POA: Diagnosis not present

## 2018-07-10 DIAGNOSIS — N184 Chronic kidney disease, stage 4 (severe): Secondary | ICD-10-CM | POA: Diagnosis present

## 2018-07-10 DIAGNOSIS — R8279 Other abnormal findings on microbiological examination of urine: Secondary | ICD-10-CM | POA: Diagnosis not present

## 2018-07-10 DIAGNOSIS — G910 Communicating hydrocephalus: Secondary | ICD-10-CM | POA: Diagnosis present

## 2018-07-10 DIAGNOSIS — Y752 Prosthetic and other implants, materials and neurological devices associated with adverse incidents: Secondary | ICD-10-CM | POA: Diagnosis present

## 2018-07-10 DIAGNOSIS — I129 Hypertensive chronic kidney disease with stage 1 through stage 4 chronic kidney disease, or unspecified chronic kidney disease: Secondary | ICD-10-CM | POA: Diagnosis present

## 2018-07-10 DIAGNOSIS — B952 Enterococcus as the cause of diseases classified elsewhere: Secondary | ICD-10-CM | POA: Diagnosis present

## 2018-07-10 DIAGNOSIS — N3090 Cystitis, unspecified without hematuria: Secondary | ICD-10-CM

## 2018-07-10 DIAGNOSIS — G960 Cerebrospinal fluid leak: Secondary | ICD-10-CM | POA: Diagnosis not present

## 2018-07-10 DIAGNOSIS — R836 Abnormal cytological findings in cerebrospinal fluid: Secondary | ICD-10-CM | POA: Diagnosis not present

## 2018-07-10 DIAGNOSIS — G9619 Other disorders of meninges, not elsewhere classified: Secondary | ICD-10-CM | POA: Diagnosis not present

## 2018-07-10 HISTORY — DX: Presence of dental prosthetic device (complete) (partial): Z97.2

## 2018-07-10 HISTORY — PX: SHUNT REVISION: SHX343

## 2018-07-10 HISTORY — DX: Presence of spectacles and contact lenses: Z97.3

## 2018-07-10 LAB — CREATININE, SERUM
Creatinine, Ser: 2.8 mg/dL — ABNORMAL HIGH (ref 0.44–1.00)
GFR calc Af Amer: 19 mL/min — ABNORMAL LOW (ref >60)
GFR calc non Af Amer: 16 mL/min — ABNORMAL LOW (ref >60)

## 2018-07-10 LAB — CBC
HCT: 40.2 % (ref 36.0–46.0)
Hemoglobin: 13.3 g/dL (ref 12.0–15.0)
MCH: 29.8 pg (ref 26.0–34.0)
MCHC: 33.1 g/dL (ref 30.0–36.0)
MCV: 89.9 fL (ref 80.0–100.0)
Platelets: 161 10*3/uL (ref 150–400)
RBC: 4.47 MIL/uL (ref 3.87–5.11)
RDW: 12.3 % (ref 11.5–15.5)
WBC: 14 10*3/uL — ABNORMAL HIGH (ref 4.0–10.5)
nRBC: 0 % (ref 0.0–0.2)

## 2018-07-10 LAB — CSF CELL COUNT WITH DIFFERENTIAL
Eosinophils, CSF: 0 % (ref 0–1)
Lymphs, CSF: 1 % — ABNORMAL LOW (ref 40–80)
Monocyte-Macrophage-Spinal Fluid: 1 % — ABNORMAL LOW (ref 15–45)
RBC Count, CSF: 690 /mm3 — ABNORMAL HIGH
Segmented Neutrophils-CSF: 98 % — ABNORMAL HIGH (ref 0–6)
WBC, CSF: 1010 /mm3 (ref 0–5)

## 2018-07-10 LAB — PROTEIN AND GLUCOSE, CSF
Glucose, CSF: <20 mg/dL — CL (ref 40–70)
Total  Protein, CSF: 203 mg/dL — ABNORMAL HIGH (ref 15–45)

## 2018-07-10 LAB — TYPE AND SCREEN
ABO/RH(D): A POS
Antibody Screen: NEGATIVE

## 2018-07-10 LAB — GLUCOSE, CAPILLARY
Glucose-Capillary: 100 mg/dL — ABNORMAL HIGH (ref 70–99)
Glucose-Capillary: 128 mg/dL — ABNORMAL HIGH (ref 70–99)

## 2018-07-10 SURGERY — SHUNT REVISION
Anesthesia: General | Site: Head | Laterality: Left

## 2018-07-10 MED ORDER — BACITRACIN ZINC 500 UNIT/GM EX OINT
TOPICAL_OINTMENT | CUTANEOUS | Status: DC | PRN
  Administered 2018-07-10: 1 via TOPICAL

## 2018-07-10 MED ORDER — LIDOCAINE 2% (20 MG/ML) 5 ML SYRINGE
INTRAMUSCULAR | Status: AC
  Filled 2018-07-10: qty 5

## 2018-07-10 MED ORDER — ACETAMINOPHEN 325 MG PO TABS: 650.0000 mg | ORAL_TABLET | ORAL | Status: DC | PRN

## 2018-07-10 MED ORDER — DEXAMETHASONE SODIUM PHOSPHATE 10 MG/ML IJ SOLN
INTRAMUSCULAR | Status: AC
  Filled 2018-07-10: qty 2

## 2018-07-10 MED ORDER — ONDANSETRON HCL 4 MG/2ML IJ SOLN
INTRAMUSCULAR | Status: AC
  Filled 2018-07-10: qty 2

## 2018-07-10 MED ORDER — ONDANSETRON HCL 4 MG/2ML IJ SOLN: 4.0000 mg | Freq: Once | INTRAMUSCULAR | Status: DC | PRN

## 2018-07-10 MED ORDER — INSULIN DETEMIR 100 UNIT/ML ~~LOC~~ SOLN
10.0000 [IU] | Freq: Every evening | SUBCUTANEOUS | Status: DC
  Administered 2018-07-12 – 2018-07-17 (×6): 10 [IU] via SUBCUTANEOUS
  Filled 2018-07-10 (×10): qty 0.1

## 2018-07-10 MED ORDER — EPHEDRINE 5 MG/ML INJ
INTRAVENOUS | Status: AC
  Filled 2018-07-10: qty 10

## 2018-07-10 MED ORDER — LACTATED RINGERS IV SOLN
INTRAVENOUS | Status: DC | PRN
  Administered 2018-07-10: 08:00:00 via INTRAVENOUS

## 2018-07-10 MED ORDER — 0.9 % SODIUM CHLORIDE (POUR BTL) OPTIME
TOPICAL | Status: DC | PRN
  Administered 2018-07-10: 1000 mL

## 2018-07-10 MED ORDER — DOCUSATE SODIUM 100 MG PO CAPS
100.0000 mg | ORAL_CAPSULE | Freq: Two times a day (BID) | ORAL | Status: DC
  Administered 2018-07-10 – 2018-07-22 (×18): 100 mg via ORAL
  Filled 2018-07-10 (×23): qty 1

## 2018-07-10 MED ORDER — SODIUM CHLORIDE 0.9 % IV SOLN
2.0000 g | Freq: Two times a day (BID) | INTRAVENOUS | Status: DC
  Administered 2018-07-10 – 2018-07-11 (×3): 2 g via INTRAVENOUS
  Filled 2018-07-10 (×3): qty 20
  Filled 2018-07-10: qty 2
  Filled 2018-07-10 (×2): qty 20

## 2018-07-10 MED ORDER — ROCURONIUM BROMIDE 100 MG/10ML IV SOLN
INTRAVENOUS | Status: DC | PRN
  Administered 2018-07-10: 40 mg via INTRAVENOUS

## 2018-07-10 MED ORDER — PROPOFOL 10 MG/ML IV BOLUS
INTRAVENOUS | Status: DC | PRN
  Administered 2018-07-10: 120 mg via INTRAVENOUS

## 2018-07-10 MED ORDER — LIDOCAINE-EPINEPHRINE 1 %-1:100000 IJ SOLN
INTRAMUSCULAR | Status: AC
  Filled 2018-07-10: qty 1

## 2018-07-10 MED ORDER — SUGAMMADEX SODIUM 200 MG/2ML IV SOLN
INTRAVENOUS | Status: DC | PRN
  Administered 2018-07-10: 200 mg via INTRAVENOUS

## 2018-07-10 MED ORDER — PROPOFOL 10 MG/ML IV BOLUS
INTRAVENOUS | Status: AC
  Filled 2018-07-10: qty 20

## 2018-07-10 MED ORDER — FENTANYL CITRATE (PF) 100 MCG/2ML IJ SOLN: 25.0000 ug | INTRAMUSCULAR | Status: DC | PRN

## 2018-07-10 MED ORDER — MIRTAZAPINE 7.5 MG PO TABS
7.5000 mg | ORAL_TABLET | Freq: Every day | ORAL | Status: DC
  Administered 2018-07-10 – 2018-07-21 (×8): 7.5 mg via ORAL
  Filled 2018-07-10: qty 0.5
  Filled 2018-07-10 (×9): qty 1
  Filled 2018-07-10: qty 0.5
  Filled 2018-07-10 (×3): qty 1
  Filled 2018-07-10: qty 0.5

## 2018-07-10 MED ORDER — FENTANYL CITRATE (PF) 250 MCG/5ML IJ SOLN
INTRAMUSCULAR | Status: AC
  Filled 2018-07-10: qty 5

## 2018-07-10 MED ORDER — ONDANSETRON HCL 4 MG/2ML IJ SOLN
4.0000 mg | INTRAMUSCULAR | Status: DC | PRN
  Filled 2018-07-10: qty 2

## 2018-07-10 MED ORDER — ONDANSETRON HCL 4 MG/2ML IJ SOLN
INTRAMUSCULAR | Status: DC | PRN
  Administered 2018-07-10: 4 mg via INTRAVENOUS

## 2018-07-10 MED ORDER — HYDROMORPHONE HCL 1 MG/ML IJ SOLN
0.5000 mg | INTRAMUSCULAR | Status: DC | PRN
  Administered 2018-07-11 – 2018-07-20 (×9): 0.5 mg via INTRAVENOUS
  Filled 2018-07-10 (×9): qty 1

## 2018-07-10 MED ORDER — CARVEDILOL 12.5 MG PO TABS
12.5000 mg | ORAL_TABLET | Freq: Every day | ORAL | Status: DC
  Administered 2018-07-11 – 2018-07-22 (×11): 12.5 mg via ORAL
  Filled 2018-07-10 (×12): qty 1

## 2018-07-10 MED ORDER — THROMBIN 5000 UNITS EX SOLR
CUTANEOUS | Status: AC
  Filled 2018-07-10: qty 5000

## 2018-07-10 MED ORDER — PHENYLEPHRINE 40 MCG/ML (10ML) SYRINGE FOR IV PUSH (FOR BLOOD PRESSURE SUPPORT)
PREFILLED_SYRINGE | INTRAVENOUS | Status: AC
  Filled 2018-07-10: qty 10

## 2018-07-10 MED ORDER — PROMETHAZINE HCL 25 MG PO TABS: 12.5000 mg | ORAL_TABLET | ORAL | Status: DC | PRN

## 2018-07-10 MED ORDER — LABETALOL HCL 5 MG/ML IV SOLN
INTRAVENOUS | Status: AC
  Administered 2018-07-10: 5 mg
  Filled 2018-07-10: qty 4

## 2018-07-10 MED ORDER — LABETALOL HCL 5 MG/ML IV SOLN
5.0000 mg | INTRAVENOUS | Status: DC | PRN
  Administered 2018-07-10 (×3): 5 mg via INTRAVENOUS

## 2018-07-10 MED ORDER — SUCCINYLCHOLINE CHLORIDE 200 MG/10ML IV SOSY
PREFILLED_SYRINGE | INTRAVENOUS | Status: AC
  Filled 2018-07-10: qty 10

## 2018-07-10 MED ORDER — LABETALOL HCL 5 MG/ML IV SOLN
10.0000 mg | INTRAVENOUS | Status: DC | PRN
  Administered 2018-07-12 – 2018-07-18 (×3): 20 mg via INTRAVENOUS
  Administered 2018-07-19: 10 mg via INTRAVENOUS
  Filled 2018-07-10 (×2): qty 4
  Filled 2018-07-10: qty 8
  Filled 2018-07-10 (×3): qty 4

## 2018-07-10 MED ORDER — HEPARIN SODIUM (PORCINE) 5000 UNIT/ML IJ SOLN
5000.0000 [IU] | Freq: Three times a day (TID) | INTRAMUSCULAR | Status: DC
  Administered 2018-07-12 – 2018-07-22 (×21): 5000 [IU] via SUBCUTANEOUS
  Filled 2018-07-10 (×24): qty 1

## 2018-07-10 MED ORDER — CHLORHEXIDINE GLUCONATE CLOTH 2 % EX PADS: 6.0000 | MEDICATED_PAD | Freq: Once | CUTANEOUS | Status: DC

## 2018-07-10 MED ORDER — SUCCINYLCHOLINE CHLORIDE 20 MG/ML IJ SOLN
INTRAMUSCULAR | Status: DC | PRN
  Administered 2018-07-10: 120 mg via INTRAVENOUS

## 2018-07-10 MED ORDER — AMLODIPINE BESYLATE 5 MG PO TABS
5.0000 mg | ORAL_TABLET | Freq: Every evening | ORAL | Status: DC
  Administered 2018-07-10 – 2018-07-21 (×10): 5 mg via ORAL
  Filled 2018-07-10 (×12): qty 1

## 2018-07-10 MED ORDER — LIDOCAINE-EPINEPHRINE 1 %-1:100000 IJ SOLN
INTRAMUSCULAR | Status: DC | PRN
  Administered 2018-07-10: 2 mL

## 2018-07-10 MED ORDER — CEFAZOLIN SODIUM-DEXTROSE 2-4 GM/100ML-% IV SOLN
2.0000 g | INTRAVENOUS | Status: AC
  Administered 2018-07-10: 09:00:00 2 g via INTRAVENOUS
  Filled 2018-07-10: qty 100

## 2018-07-10 MED ORDER — PHENYLEPHRINE HCL 10 MG/ML IJ SOLN
INTRAMUSCULAR | Status: DC | PRN
  Administered 2018-07-10: 80 ug via INTRAVENOUS

## 2018-07-10 MED ORDER — ROCURONIUM BROMIDE 50 MG/5ML IV SOSY
PREFILLED_SYRINGE | INTRAVENOUS | Status: AC
  Filled 2018-07-10: qty 5

## 2018-07-10 MED ORDER — BACITRACIN ZINC 500 UNIT/GM EX OINT
TOPICAL_OINTMENT | CUTANEOUS | Status: AC
  Filled 2018-07-10: qty 28.35

## 2018-07-10 MED ORDER — POLYETHYLENE GLYCOL 3350 17 G PO PACK: 17.0000 g | PACK | Freq: Every day | ORAL | Status: DC | PRN

## 2018-07-10 MED ORDER — ONDANSETRON HCL 4 MG PO TABS: 4.0000 mg | ORAL_TABLET | ORAL | Status: DC | PRN

## 2018-07-10 MED ORDER — FENTANYL CITRATE (PF) 250 MCG/5ML IJ SOLN
INTRAMUSCULAR | Status: DC | PRN
  Administered 2018-07-10: 75 ug via INTRAVENOUS
  Administered 2018-07-10 (×2): 25 ug via INTRAVENOUS
  Administered 2018-07-10: 50 ug via INTRAVENOUS
  Administered 2018-07-10: 25 ug via INTRAVENOUS
  Administered 2018-07-10: 50 ug via INTRAVENOUS

## 2018-07-10 MED ORDER — HYDROCODONE-ACETAMINOPHEN 5-325 MG PO TABS
1.0000 | ORAL_TABLET | ORAL | Status: DC | PRN
  Administered 2018-07-13 – 2018-07-16 (×5): 1 via ORAL
  Filled 2018-07-10 (×7): qty 1

## 2018-07-10 MED ORDER — ACETAMINOPHEN 650 MG RE SUPP: 650.0000 mg | RECTAL | Status: DC | PRN

## 2018-07-10 MED ORDER — NAPHAZOLINE-GLYCERIN 0.012-0.2 % OP SOLN
1.0000 [drp] | Freq: Four times a day (QID) | OPHTHALMIC | Status: DC
  Administered 2018-07-10 – 2018-07-22 (×41): 1 [drp] via OPHTHALMIC
  Filled 2018-07-10: qty 15

## 2018-07-10 SURGICAL SUPPLY — 65 items
BLADE CLIPPER SURG (BLADE) ×2 IMPLANT
BLADE SURG 11 STRL SS (BLADE) ×2 IMPLANT
BOOT SUTURE AID YELLOW STND (SUTURE) IMPLANT
BUR ACORN 9.0 PRECISION (BURR) IMPLANT
CANISTER SUCT 3000ML PPV (MISCELLANEOUS) ×2 IMPLANT
CATH STRATA NSC SNAP SHUNT (Shunt) ×2 IMPLANT
CATH VENTRICULAR 7CM (Shunt) ×2 IMPLANT
CLIP RANEY DISP (INSTRUMENTS) IMPLANT
COVER WAND RF STERILE (DRAPES) IMPLANT
DECANTER SPIKE VIAL GLASS SM (MISCELLANEOUS) ×2 IMPLANT
DERMABOND ADVANCED (GAUZE/BANDAGES/DRESSINGS) ×1
DERMABOND ADVANCED .7 DNX12 (GAUZE/BANDAGES/DRESSINGS) ×1 IMPLANT
DRAPE HALF SHEET 40X57 (DRAPES) ×2 IMPLANT
DRAPE INCISE IOBAN 66X45 STRL (DRAPES) ×2 IMPLANT
DRAPE ORTHO SPLIT 77X108 STRL (DRAPES) ×1
DRAPE POUCH INSTRU U-SHP 10X18 (DRAPES) ×2 IMPLANT
DRAPE SURG ORHT 6 SPLT 77X108 (DRAPES) ×1 IMPLANT
DURAPREP 26ML APPLICATOR (WOUND CARE) ×4 IMPLANT
ELECT REM PT RETURN 9FT ADLT (ELECTROSURGICAL) ×2
ELECTRODE REM PT RTRN 9FT ADLT (ELECTROSURGICAL) ×1 IMPLANT
GAUZE 4X4 16PLY RFD (DISPOSABLE) IMPLANT
GLOVE BIO SURGEON STRL SZ7 (GLOVE) ×2 IMPLANT
GLOVE BIO SURGEON STRL SZ7.5 (GLOVE) ×2 IMPLANT
GLOVE BIO SURGEON STRL SZ8 (GLOVE) ×2 IMPLANT
GLOVE BIO SURGEON STRL SZ8.5 (GLOVE) ×2 IMPLANT
GLOVE BIOGEL PI IND STRL 8 (GLOVE) ×2 IMPLANT
GLOVE BIOGEL PI INDICATOR 8 (GLOVE) ×2
GLOVE ECLIPSE 7.5 STRL STRAW (GLOVE) ×6 IMPLANT
GLOVE EXAM NITRILE XL STR (GLOVE) IMPLANT
GLOVE INDICATOR 7.5 STRL GRN (GLOVE) ×2 IMPLANT
GOWN STRL REUS W/ TWL LRG LVL3 (GOWN DISPOSABLE) ×1 IMPLANT
GOWN STRL REUS W/ TWL XL LVL3 (GOWN DISPOSABLE) ×1 IMPLANT
GOWN STRL REUS W/TWL 2XL LVL3 (GOWN DISPOSABLE) ×2 IMPLANT
GOWN STRL REUS W/TWL LRG LVL3 (GOWN DISPOSABLE) ×1
GOWN STRL REUS W/TWL XL LVL3 (GOWN DISPOSABLE) ×1
HEMOSTAT POWDER KIT SURGIFOAM (HEMOSTASIS) IMPLANT
HEMOSTAT SURGICEL 2X14 (HEMOSTASIS) IMPLANT
KIT BASIN OR (CUSTOM PROCEDURE TRAY) ×2 IMPLANT
KIT TURNOVER KIT B (KITS) ×2 IMPLANT
MARKER SKIN DUAL TIP RULER LAB (MISCELLANEOUS) IMPLANT
NEEDLE HYPO 22GX1.5 SAFETY (NEEDLE) ×2 IMPLANT
NS IRRIG 1000ML POUR BTL (IV SOLUTION) ×2 IMPLANT
PACK LAMINECTOMY NEURO (CUSTOM PROCEDURE TRAY) ×2 IMPLANT
PAD ARMBOARD 7.5X6 YLW CONV (MISCELLANEOUS) ×8 IMPLANT
PASSER CATH 65CM DISP (NEUROSURGERY SUPPLIES) IMPLANT
SHEATH PERITONEAL INTRO 61 (MISCELLANEOUS) IMPLANT
SPONGE LAP 4X18 RFD (DISPOSABLE) IMPLANT
STAPLER SKIN PROX WIDE 3.9 (STAPLE) ×2 IMPLANT
STRIP CLOSURE SKIN 1/2X4 (GAUZE/BANDAGES/DRESSINGS) IMPLANT
SUT ETHILON 3 0 FSL (SUTURE) IMPLANT
SUT MON AB 3-0 SH 27 (SUTURE) ×1
SUT MON AB 3-0 SH27 (SUTURE) ×1 IMPLANT
SUT NURALON 4 0 TR CR/8 (SUTURE) ×2 IMPLANT
SUT PDS AB 3-0 SH 27 (SUTURE) ×2 IMPLANT
SUT SILK 0 TIES 10X30 (SUTURE) IMPLANT
SUT SILK 2 0 TIES 10X30 (SUTURE) ×2 IMPLANT
SUT SILK 3 0 SH 30 (SUTURE) IMPLANT
SUT VIC AB 2-0 CP2 18 (SUTURE) ×8 IMPLANT
SUT VIC AB 3-0 SH 8-18 (SUTURE) ×2 IMPLANT
TOWEL GREEN STERILE (TOWEL DISPOSABLE) ×4 IMPLANT
TOWEL GREEN STERILE FF (TOWEL DISPOSABLE) ×2 IMPLANT
TRAY LUMBAR PUNCTURE AD (SET/KITS/TRAYS/PACK) ×2 IMPLANT
TUBE CONNECTING 12X1/4 (SUCTIONS) IMPLANT
UNDERPAD 30X30 (UNDERPADS AND DIAPERS) IMPLANT
WATER STERILE IRR 1000ML POUR (IV SOLUTION) ×2 IMPLANT

## 2018-07-10 NOTE — Anesthesia Preprocedure Evaluation (Signed)
Anesthesia Evaluation  Patient identified by MRN, date of birth, ID bandGeneral Assessment Comment:Patient lethargic, following commands, oriented x 3 moving all extremities   Reviewed: Allergy & Precautions, NPO status , Patient's Chart, lab work & pertinent test results  Airway Mallampati: II  TM Distance: >3 FB Neck ROM: Full    Dental  (+) Partial Upper, Partial Lower, Dental Advisory Given   Pulmonary    breath sounds clear to auscultation       Cardiovascular hypertension,  Rhythm:Regular Rate:Normal     Neuro/Psych    GI/Hepatic   Endo/Other  diabetes  Renal/GU      Musculoskeletal   Abdominal   Peds  Hematology   Anesthesia Other Findings   Reproductive/Obstetrics                             Anesthesia Physical Anesthesia Plan  ASA: III  Anesthesia Plan: General   Post-op Pain Management:    Induction: Intravenous  PONV Risk Score and Plan: Ondansetron and Dexamethasone  Airway Management Planned: Oral ETT  Additional Equipment: CVP  Intra-op Plan:   Post-operative Plan: Possible Post-op intubation/ventilation  Informed Consent: I have reviewed the patients History and Physical, chart, labs and discussed the procedure including the risks, benefits and alternatives for the proposed anesthesia with the patient or authorized representative who has indicated his/her understanding and acceptance.     Dental advisory given  Plan Discussed with: CRNA and Anesthesiologist  Anesthesia Plan Comments:         Anesthesia Quick Evaluation

## 2018-07-10 NOTE — Progress Notes (Signed)
Dr. Linna Caprice notified of patient elevated BP.  No new orders received.  Letitia Libra, CRNA also informed.  Will continue to monitor patient.

## 2018-07-10 NOTE — Anesthesia Procedure Notes (Signed)
Procedure Name: Intubation Date/Time: 07/10/2018 8:40 AM Performed by: Shirlyn Goltz, CRNA Pre-anesthesia Checklist: Patient identified, Emergency Drugs available, Suction available and Patient being monitored Patient Re-evaluated:Patient Re-evaluated prior to induction Oxygen Delivery Method: Circle system utilized Preoxygenation: Pre-oxygenation with 100% oxygen Induction Type: IV induction and Rapid sequence Laryngoscope Size: Mac and 3 Grade View: Grade I Tube type: Oral Tube size: 7.0 mm Number of attempts: 1 Airway Equipment and Method: Stylet Placement Confirmation: ETT inserted through vocal cords under direct vision,  positive ETCO2 and breath sounds checked- equal and bilateral Secured at: 20 cm Tube secured with: Tape Dental Injury: Teeth and Oropharynx as per pre-operative assessment

## 2018-07-10 NOTE — H&P (Signed)
Surgical H&P Update  HPI: 68 y.o. 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 She unfortunately had recurrence of her pseudomeningocele with CSF leakage and worsening of her mental status, concerning for shunt malfunction.  PMHx:  Past Medical History:  Diagnosis Date  . Anxiety   . Arthritis    knee, back  . CHF (congestive heart failure) (HCC)    was a result of Cotiorsteroids  . Chronic kidney disease    stage IV  . Depression   . Diabetes mellitus without complication ([place])    Type II  . History of blood transfusion    ecotic pregnacy  . Hypertension   . Neuropathy    feet  . Occult spina bifida   . SVD (spontaneous vaginal delivery)    x 2  . Wears glasses   . Wears partial dentures    upper   FamHx:  Family History  Problem Relation Age of Onset  . Cancer Mother        colon  . Cancer - Colon Father    SocHx:  reports that she has never smoked. She has never used smokeless tobacco. She reports that she does not drink alcohol or use drugs.  Physical Exam: Somnolent, minimal verbal output, PERRL, +R HB 5 FD FCx4  Assesment/Plan: 68 y.o. 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 Risks, benefits, and alternatives discussed and the patient would like to continue with surgery. -OR today -4NP post-op  Judith Part, MD 07/10/18 10:29 AM

## 2018-07-10 NOTE — Anesthesia Postprocedure Evaluation (Signed)
Anesthesia Post Note  Patient: Natalie Friedman  Procedure(s) Performed: Left ventricular-peritoneal shunt revision (Left Head)     Patient location during evaluation: PACU Anesthesia Type: General Level of consciousness: lethargic, confused and responds to stimulation Respiratory status: spontaneous breathing, nonlabored ventilation and respiratory function stable Anesthetic complications: no    Last Vitals:  Vitals:   07/10/18 1420 07/10/18 1639  BP: (!) 166/102 (!) 162/99  Pulse: 87 76  Resp: (!) 22 18  Temp: 37.1 C 37.1 C  SpO2: 97% 100%    Last Pain:  Vitals:   07/10/18 1639  TempSrc: Oral  PainSc:                  Keslee Harrington COKER

## 2018-07-10 NOTE — Brief Op Note (Signed)
07/10/2018  10:31 AM  PATIENT:  Natalie Friedman  68 y.o. female  PRE-OPERATIVE DIAGNOSIS:  Shunt malfunction  POST-OPERATIVE DIAGNOSIS:  Shunt malfunction  PROCEDURE:  Procedure(s) with comments: Left ventricular-peritoneal shunt revision (Left) - left  SURGEON:  Surgeon(s) and Role:    * Soledad Budreau, Joyice Faster, MD - Primary    * Newman Pies, MD - Assisting  PHYSICIAN ASSISTANT:   ANESTHESIA:   general  EBL:  20cc  BLOOD ADMINISTERED:none  DRAINS:  none  LOCAL MEDICATIONS USED:  LIDOCAINE   SPECIMEN:  CSF  DISPOSITION OF SPECIMEN:  Microbiology  COUNTS:  YES  TOURNIQUET:  * No tourniquets in log *  DICTATION: .Note written in EPIC  PLAN OF CARE: Admit to inpatient   PATIENT DISPOSITION:  PACU - hemodynamically stable.   Delay start of Pharmacological VTE agent (>24hrs) due to surgical blood loss or risk of bleeding: yes

## 2018-07-10 NOTE — Op Note (Signed)
PATIENT: Natalie Friedman  DAY OF SURGERY: 07/10/18   PRE-OPERATIVE DIAGNOSIS:  Ventriculoperitoneal shunt malfunction   POST-OPERATIVE DIAGNOSIS:  Ventriculoperitoneal shunt malfunction   PROCEDURE:  Revision of left ventriculoperitoneal shunt    SURGEON:  Surgeon(s) and Role:    Judith Part, MD - Primary    Newman Pies, MD - Assisting   ANESTHESIA: ETGA   BRIEF HISTORY: This is a 68 year old woman who presented with recurrence of pseudomeningocele and CSF leakage with worsening of mental status, concerning for shunt malfunction. This was discussed with the patient and her husband as well as risks, benefits, and alternatives and wished to proceed with shunt revision.   OPERATIVE DETAIL: The patient was taken to the operating room and placed on the OR table in the supine position with the head turned to the right. A formal time out was performed with two patient identifiers and confirmed the operative site. Anesthesia was induced by the anesthesia team. The operative site was marked, hair was clipped with surgical clippers, the area was then prepped and draped in a sterile fashion. The prior cranial incision was reopened and system disconnected for analysis. There was excellent proximal flow in the ventricular catheter. The distal catheter also had good distal runoff with the manometer, but did not drain completely. Given that the patient appears to require a very large amount of drainage, I thought that there was likely a partial obstruction distally or the valve was malfunctioning.  I sent CSF for stat gram stain, usual CSF studies, and culture. I wanted to use a valve without a siphon guard to maximize her drainage, which required replacement of the entire system because the strata [facility] comes as a complete unit, already secured at each connection point. I therefore removed the entire existing hardware and replaced the system with a Strata II [place] by opening the abdominal incision and  relay incision, accessing the peritoneal cavity, re-tunneling, soft-passing a new ventricular catheter, and confirming spontaneous flow in the distal catheter before placing it into the peritoneum in a hand-over-hand technique without resistance. All instrument and sponge counts were correct, the incisions were irrigated with antibiotic solution, and were then closed in layers. The patient was then returned to anesthesia for emergence. No apparent complications at the completion of the procedure.   EBL:  28mL   DRAINS: none   SPECIMENS: CSF sent to microbiology  HARDWARE: Strata [place] shunt system set to performance level 0.5   Judith Part, MD 07/10/18 10:32 AM

## 2018-07-10 NOTE — Transfer of Care (Signed)
Immediate Anesthesia Transfer of Care Note  Patient: Natalie Friedman  Procedure(s) Performed: Left ventricular-peritoneal shunt revision (Left Head)  Patient Location: PACU  Anesthesia Type:General  Level of Consciousness: responds to stimulation; upward gaze, responds to pain.  Will move extremities spontaneously  Airway & Oxygen Therapy: Patient Spontanous Breathing and Patient connected to face mask oxygen  Post-op Assessment: Report given to RN and Post -op Vital signs reviewed and stable  Post vital signs: Reviewed and stable  Last Vitals:  Vitals Value Taken Time  BP 180/119 07/10/2018 10:51 AM  Temp    Pulse 107 07/10/2018 10:55 AM  Resp 21 07/10/2018 10:55 AM  SpO2 100 % 07/10/2018 10:55 AM  Vitals shown include unvalidated device data.  Last Pain:  Vitals:   07/10/18 0703  TempSrc:   PainSc: 0-No pain         Complications: No apparent anesthesia complications

## 2018-07-11 ENCOUNTER — Encounter (HOSPITAL_COMMUNITY): Payer: Self-pay | Admitting: Neurological Surgery

## 2018-07-11 ENCOUNTER — Inpatient Hospital Stay: Payer: Self-pay

## 2018-07-11 LAB — GLUCOSE, CAPILLARY
Glucose-Capillary: 216 mg/dL — ABNORMAL HIGH (ref 70–99)
Glucose-Capillary: 198 mg/dL — ABNORMAL HIGH (ref 70–99)

## 2018-07-11 LAB — PATHOLOGIST SMEAR REVIEW

## 2018-07-11 MED ORDER — SODIUM CHLORIDE 0.9% FLUSH
10.0000 mL | Freq: Two times a day (BID) | INTRAVENOUS | Status: DC
  Administered 2018-07-11 – 2018-07-17 (×10): 10 mL
  Administered 2018-07-18: 20 mL
  Administered 2018-07-19 – 2018-07-21 (×4): 10 mL

## 2018-07-11 MED ORDER — SODIUM CHLORIDE 0.9% FLUSH
10.0000 mL | INTRAVENOUS | Status: DC | PRN
  Administered 2018-07-15: 10 mL
  Filled 2018-07-11: qty 40

## 2018-07-11 NOTE — Consult Note (Signed)
   [facility] CM Inpatient Consult   07/11/2018  HOLY BATTENFIELD 12-27-1950 [id number]  Patient screened for readmission less than 30 days with 5 hospitalizations in the past 6 month to check if potential [facility] services if needed.. 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 She unfortunately had recurrence of her pseudomeningocele with CSF leakage and worsening of her mental status, concerning for shunt malfunction. Review of PT and OT notes reveals that the patient is being recommended for inpatient rehab stay and would need authorization with [employer].  Will follow for progress and disposition.   Please place a [facility] referral if transferrinf back to community  or for questions contact:   Natividad Brood, RN BSN [facility] Liaison  [phone] business mobile phone Toll free office [phone]

## 2018-07-11 NOTE — Progress Notes (Signed)
Neurosurgery Service Progress Note  Subjective: No acute events overnight.    Objective: Vitals:   07/10/18 1947 07/10/18 2348 07/11/18 0400 07/11/18 0741  BP: (!) 153/112 138/76 (!) 144/96 (!) 137/99  Pulse: 83 81  74  Resp: (!) 21   18  Temp: 98.6 F (37 C) 98 F (36.7 C) 97.9 F (36.6 C) 97.7 F (36.5 C)  TempSrc: Oral Axillary Oral Oral  SpO2: 100% 98%  100%  Weight:      Height:       Temp (24hrs), Avg:98.2 F (36.8 C), Min:97.5 F (36.4 C), Max:98.7 F (37.1 C)  CBC Latest Ref Rng & Units 07/10/2018 06/26/2018 06/07/2018  WBC 4.0 - 10.5 K/uL 14.0(H) 8.0 6.2  Hemoglobin 12.0 - 15.0 g/dL 13.3 12.5 11.6(L)  Hematocrit 36.0 - 46.0 % 40.2 39.7 35.6(L)  Platelets 150 - 400 K/uL 161 188 124(L)   BMP Latest Ref Rng & Units 07/10/2018 06/26/2018 06/07/2018  Glucose 70 - 99 mg/dL - 96 96  BUN 8 - 23 mg/dL - 35(H) 51(H)  Creatinine 0.44 - 1.00 mg/dL 2.80(H) 2.56(H) 2.75(H)  Sodium 135 - 145 mmol/L - 139 137  Potassium 3.5 - 5.1 mmol/L - 4.2 5.2(H)  Chloride 98 - 111 mmol/L - 108 104  CO2 22 - 32 mmol/L - 22 23  Calcium 8.9 - 10.3 mg/dL - 10.2 9.8    Intake/Output Summary (Last 24 hours) at 07/11/2018 1116 Last data filed at 07/11/2018 0600 Gross per 24 hour  Intake 100 ml  Output 75 ml  Net 25 ml    Current Facility-Administered Medications:  .  acetaminophen (TYLENOL) tablet 650 mg, 650 mg, Oral, Q4H PRN **OR** acetaminophen (TYLENOL) suppository 650 mg, 650 mg, Rectal, Q4H PRN, ,  A, MD .  amLODipine (NORVASC) tablet 5 mg, 5 mg, Oral, QPM, , Joyice Faster, MD, 5 mg at 07/10/18 1806 .  carvedilol (COREG) tablet 12.5 mg, 12.5 mg, Oral, Daily, Judith Part, MD, 12.5 mg at 07/11/18 0949 .  cefTRIAXone (ROCEPHIN) 2 g in sodium chloride 0.9 % 100 mL IVPB, 2 g, Intravenous, Q12H, ,  A, MD, Last Rate: 200 mL/hr at 07/11/18 0949, 2 g at 07/11/18 0949 .  docusate sodium (COLACE) capsule 100 mg, 100 mg, Oral, BID, Judith Part, MD, 100 mg  at 07/11/18 0949 .  [START ON 07/12/2018] heparin injection 5,000 Units, 5,000 Units, Subcutaneous, Q8H, ,  A, MD .  HYDROcodone-acetaminophen (NORCO/VICODIN) 5-325 MG per tablet 1 tablet, 1 tablet, Oral, Q4H PRN, Judith Part, MD .  HYDROmorphone (DILAUDID) injection 0.5 mg, 0.5 mg, Intravenous, Q3H PRN, Judith Part, MD .  insulin detemir (LEVEMIR) injection 10 Units, 10 Units, Subcutaneous, QPM, ,  A, MD .  labetalol (NORMODYNE,TRANDATE) injection 10-40 mg, 10-40 mg, Intravenous, Q10 min PRN, Judith Part, MD .  mirtazapine (REMERON) tablet 7.5 mg, 7.5 mg, Oral, QHS, ,  A, MD, 7.5 mg at 07/10/18 2152 .  naphazoline-glycerin (CLEAR EYES REDNESS) ophth solution 1 drop, 1 drop, Right Eye, QID, Judith Part, MD, 1 drop at 07/11/18 0949 .  ondansetron (ZOFRAN) tablet 4 mg, 4 mg, Oral, Q4H PRN **OR** ondansetron (ZOFRAN) injection 4 mg, 4 mg, Intravenous, Q4H PRN, ,  A, MD .  polyethylene glycol (MIRALAX / GLYCOLAX) packet 17 g, 17 g, Oral, Daily PRN, ,  A, MD .  promethazine (PHENERGAN) tablet 12.5-25 mg, 12.5-25 mg, Oral, Q4H PRN, Judith Part, MD   Physical Exam: Awake/alert and interactive, Ox1, PERRL, EOMI, +R HB5,  Strength 5/5 x4 Shunt incisions c/d/i R retrosig incision - pseudomeningocele gone, no leakage of CSF  Assessment & Plan: 68 y.o. 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  -pseudomeningocele flat, no CSF leakage, which is reassuring that her shunt is draining sufficiently -unfortunately, after surgery tomorrow I was called by the lab with a positive gram stain and concerning CSF numbers, given that she did not have any signs/Sx of infection and the gram stain with GPCs, likely staph epi, treating empirically with ceftriaxone, awaiting cultures to grow for speciation -will require PICC line for 2-4 weeks of ABx, ordered and  pending -SCDs/TEDs, start SQH on POD2  Judith Part  07/11/18 11:16 AM

## 2018-07-11 NOTE — Progress Notes (Signed)
Peripherally Inserted Central Catheter/Midline Placement  The IV Nurse has discussed with the patient and/or persons authorized to consent for the patient, the purpose of this procedure and the potential benefits and risks involved with this procedure.  The benefits include less needle sticks, lab draws from the catheter, and the patient may be discharged home with the catheter. Risks include, but not limited to, infection, bleeding, blood clot (thrombus formation), and puncture of an artery; nerve damage and irregular heartbeat and possibility to perform a PICC exchange if needed/ordered by physician.  Alternatives to this procedure were also discussed.  Bard Power PICC patient education guide, fact sheet on infection prevention and patient information card has been provided to patient /or left at bedside.  Telephone consent obtained from husband due to altered mental status.  PICC/Midline Placement Documentation  PICC Single Lumen 37/04/88 PICC Left Cephalic 42 cm 0 cm (Active)  Indication for Insertion or Continuance of Line Home intravenous therapies (PICC only) 07/11/2018  8:57 PM  Exposed Catheter (cm) 0 cm 07/11/2018  8:57 PM  Site Assessment Clean;Dry;Intact 07/11/2018  8:57 PM  Line Status Flushed;Saline locked;Blood return noted 07/11/2018  8:57 PM  Dressing Type Transparent 07/11/2018  8:57 PM  Dressing Status Clean;Dry;Intact 07/11/2018  8:57 PM  Dressing Intervention New dressing 07/11/2018  8:57 PM  Dressing Change Due 07/18/18 07/11/2018  8:57 PM       Nellene Courtois, Nicolette Bang 07/11/2018, 8:58 PM

## 2018-07-11 NOTE — Evaluation (Signed)
Physical Therapy Evaluation Patient Details Name: Natalie Friedman MRN: [id number] DOB: 12-17-1950 Today's Date: 07/11/2018   History of Present Illness  68 y.o. 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 Admitted 2/24 for  pseudomeningocele with CSF leakage, 2/25 lumbar subarachnoid drain placed remained in hospital until 3/7 d/c'd home. 3/17 returned to hospital due to worsening mental status secondary to shunt malfunction. 3/18 shunt revision performed and d/c'd home. After additional decline in mental status admitted  4/1 returned to hospital for revision of left ventriculoperitoneal shunt.   Clinical Impression  Patient is s/p above surgery resulting in functional limitations due to the deficits listed below (see PT Problem List). Pt limited in safe mobility by decreased cognition and headache, with generalized weakness and instability Pt requires modAx2 for bed mobility, mod-maxAx2 for transfers and mod-maxAx2 for ambulation of 2 feet. Pt was sucessful with prior CIR placement with initial surgery in 04/2018, and pt will require CIR level PT at d/c to maintain safety in her home environment. PT will continue to follow acutely.     Follow Up Recommendations CIR    Equipment Recommendations  None recommended by PT    Recommendations for Other Services Rehab consult     Precautions / Restrictions Precautions Precautions: Fall Restrictions Weight Bearing Restrictions: No      Mobility  Bed Mobility Overal bed mobility: Needs Assistance Bed Mobility: Supine to Sit     Supine to sit: Max assist;+2 for physical assistance;Mod assist     General bed mobility comments: modA for movement of LE across bed but MaxAx2 for trunk to upright  Transfers Overall transfer level: Needs assistance Equipment used: Rolling walker (2 wheeled) Transfers: Sit to/from Stand Sit to Stand: Mod assist;Max assist;+2  physical assistance         General transfer comment: sit>stand from bed maxAx2, multiple sit>stand from [facility] for pericare ranging from modAx2 to maxAx2, last sit>stand to [facility] with modAx2 for transfer to recliner  Ambulation/Gait Ambulation/Gait assistance: Mod assist;Max assist;+2 physical assistance Gait Distance (Feet): 2 Feet Assistive device: Rolling walker (2 wheeled) Gait Pattern/deviations: Step-through pattern;Decreased step length - right;Decreased step length - left;Shuffle;Trunk flexed Gait velocity: slowed Gait velocity interpretation: <1.31 ft/sec, indicative of household ambulator General Gait Details: modAx2 progressing to maxAx2 for taking steps away from bed for [facility] placement      Balance Overall balance assessment: Needs assistance Sitting-balance support: Feet unsupported;Bilateral upper extremity supported Sitting balance-Leahy Scale: Fair Sitting balance - Comments: progressed from Zero balance to fair with sitting 5 minutes EoB     Standing balance-Leahy Scale: Zero                               Pertinent Vitals/Pain Pain Assessment: 0-10 Pain Score: 8  Pain Location: headache(reports decrease in pain with sitting up but scores 10/10) Pain Descriptors / Indicators: Headache Pain Intervention(s): Limited activity within patient's tolerance;Monitored during session;Repositioned    Home Living Family/patient expects to be discharged to:: Private residence Living Arrangements: Spouse/significant other Available Help at Discharge: Family;Available 24 hours/day Type of Home: House Home Access: Stairs to enter;Ramped entrance Entrance Stairs-Rails: None Entrance Stairs-Number of Steps: 2 Home Layout: Two level;Able to live on main level with bedroom/bathroom Home Equipment: Kasandra Knudsen - single point;Walker - 2 wheels;Shower seat;Wheelchair - manual Additional Comments: Pt confused about home setup so referred to prior note for home information  Prior Function Level of Independence: Needs assistance   Gait / Transfers Assistance Needed: spouse assisting with transfers to w/c at home; limited to w/c level since surgery.  ADL's / Homemaking Assistance Needed: spouse assisting wtih bathing and dressing        Hand Dominance   Dominant Hand: Right    Extremity/Trunk Assessment   Upper Extremity Assessment Upper Extremity Assessment: Defer to OT evaluation    Lower Extremity Assessment Lower Extremity Assessment: RLE deficits/detail;LLE deficits/detail;Difficult to assess due to impaired cognition RLE Deficits / Details: decreased AROM due to weakness, strength grossly assessed at 3+/5 RLE Coordination: decreased fine motor LLE Deficits / Details: decreased AROM due to weakness, strength grossly assessed at 3+/5 LLE Coordination: decreased fine motor    Cervical / Trunk Assessment Cervical / Trunk Assessment: Kyphotic  Communication   Communication: Expressive difficulties  Cognition Arousal/Alertness: Awake/alert Behavior During Therapy: Flat affect Overall Cognitive Status: Impaired/Different from baseline Area of Impairment: Attention;Memory;Following commands;Safety/judgement;Awareness;Problem solving                   Current Attention Level: Selective Memory: Decreased short-term memory(given 3 word to remember, recalled only one with prompt) Following Commands: Follows one step commands with increased time;Follows multi-step commands with increased time;Follows multi-step commands inconsistently Safety/Judgement: Decreased awareness of safety Awareness: Emergent Problem Solving: Slow processing;Decreased initiation;Difficulty sequencing;Requires verbal cues;Requires tactile cues General Comments: pt oriented, however difficulty with remembering where she was living prior to hospitalization, difficulty with scoring pain indicating higher level with less pain       General Comments General comments  (skin integrity, edema, etc.): VSS, on 2L O2 via [place], SaO2 >93%O2         Assessment/Plan    PT Assessment Patient needs continued PT services  PT Problem List Decreased strength;Decreased range of motion;Decreased activity tolerance;Decreased balance;Decreased mobility;Decreased coordination;Decreased cognition;Decreased knowledge of use of DME;Decreased safety awareness;Pain       PT Treatment Interventions DME instruction;Gait training;Functional mobility training;Therapeutic activities;Therapeutic exercise;Balance training;Cognitive remediation;Patient/family education    PT Goals (Current goals can be found in the Care Plan section)  Acute Rehab PT Goals Patient Stated Goal: none stated PT Goal Formulation: Patient unable to participate in goal setting Time For Goal Achievement: 07/25/18 Potential to Achieve Goals: Fair    Frequency Min 4X/week   Barriers to discharge        Co-evaluation PT/OT/SLP Co-Evaluation/Treatment: Yes Reason for Co-Treatment: For patient/therapist safety PT goals addressed during session: Mobility/safety with mobility         AM-PAC PT "6 Clicks" Mobility  Outcome Measure Help needed turning from your back to your side while in a flat bed without using bedrails?: Total Help needed moving from lying on your back to sitting on the side of a flat bed without using bedrails?: Total Help needed moving to and from a bed to a chair (including a wheelchair)?: A Lot Help needed standing up from a chair using your arms (e.g., wheelchair or bedside chair)?: A Lot Help needed to walk in hospital room?: Total Help needed climbing 3-5 steps with a railing? : Total 6 Click Score: 8    End of Session Equipment Utilized During Treatment: Gait belt;Oxygen Activity Tolerance: Patient tolerated treatment well Patient left: in chair;with call bell/phone within reach;with chair alarm set Nurse Communication: Mobility status;Need for lift equipment PT Visit  Diagnosis: Unsteadiness on feet (R26.81);Other abnormalities of gait and mobility (R26.89);Muscle weakness (generalized) (M62.81);Difficulty in walking, not elsewhere classified (R26.2);Dizziness and giddiness (R42);Other symptoms and signs  involving the nervous system (R29.898);Pain Pain - part of body: (headache)    Time: 3437-3578 PT Time Calculation (min) (ACUTE ONLY): 42 min   Charges:   PT Evaluation $PT Eval Moderate Complexity: 1 Mod PT Treatments $Therapeutic Activity: 8-22 mins        Landon Bassford B. Migdalia Dk PT, DPT Acute Rehabilitation Services Pager [phone] Office [phone]   [place] 07/11/2018, 10:15 AM

## 2018-07-11 NOTE — Progress Notes (Signed)
Rehab Admissions Coordinator Note:  Per PT recommendation, this patient was screened by Jhonnie Garner for appropriateness for an Inpatient Acute Rehab Consult.  At this time, we are recommending Inpatient Rehab consult. AC will contact MD to request an IP Rehab Consult Order.  Jhonnie Garner 07/11/2018, 11:16 AM  I can be reached at [phone].

## 2018-07-11 NOTE — Evaluation (Signed)
Occupational Therapy Evaluation Patient Details Name: Natalie Friedman MRN: [id number] DOB: March 12, 1951 Today's Date: 07/11/2018    History of Present Illness 68 y.o. 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 Admitted 2/24 for  pseudomeningocele with CSF leakage, 2/25 lumbar subarachnoid drain placed remained in hospital until 3/7 d/c'd home. 3/17 returned to hospital due to worsening mental status secondary to shunt malfunction. 3/18 shunt revision performed and d/c'd home. After additional decline in mental status admitted  4/1 returned to hospital for revision of left ventriculoperitoneal shunt.    Clinical Impression   This 68 y/o female presents with the above. Prior to this hospitalization pt reports receiving some assist from spouse for ADL and functional mobility. Pt presenting with impaired cognition, decreased activity tolerance and standing balance, generalized weakness. Pt currently requires two person assist for safe completion of bed mobility and functional transfers. Pt completed multiple sit<>stands at [place] with mod-maxA+2 (fluctuates pending level of fatigue), and able to take few steps with RW and modA+2 to transition to [facility] during session. Use of [place] for final transfer to recliner as pt too fatigued to attempt stand pivot transfers, only able to tolerate standing for very brief periods of time during peri-care with toileting task. She currently requires minA for UB ADL, maxA (+2) for LB ADL. Pt able to recall some prior events but noted to have delayed processing and confusion throughout, often not answering questions clearly with her initial response. She will benefit from continued acute OT services and recommend follow up therapy services at CIR level after discharge to maximize her safety and independence with ADL and mobility. Will follow.     Follow Up Recommendations  CIR;Supervision/Assistance - 24  hour    Equipment Recommendations  Other (comment)(defer to next venue)    Recommendations for Other Services Rehab consult     Precautions / Restrictions Precautions Precautions: Fall Restrictions Weight Bearing Restrictions: No      Mobility Bed Mobility Overal bed mobility: Needs Assistance Bed Mobility: Supine to Sit     Supine to sit: Max assist;+2 for physical assistance;Mod assist     General bed mobility comments: modA for movement of LE across bed but MaxAx2 for trunk to upright  Transfers Overall transfer level: Needs assistance Equipment used: Rolling walker (2 wheeled) Transfers: Sit to/from Stand Sit to Stand: Mod assist;Max assist;+2 physical assistance         General transfer comment: sit>stand from bed maxAx2, multiple sit>stand from [facility] for pericare ranging from modAx2 to maxAx2, last sit>stand to [facility] with modAx2 for transfer to recliner    Balance Overall balance assessment: Needs assistance Sitting-balance support: Feet unsupported;Bilateral upper extremity supported Sitting balance-Leahy Scale: Fair Sitting balance - Comments: progressed from Zero balance to fair with sitting 5 minutes EoB     Standing balance-Leahy Scale: Zero                             ADL either performed or assessed with clinical judgement   ADL Overall ADL's : Needs assistance/impaired Eating/Feeding: Set up;Supervision/ safety;Sitting   Grooming: Sitting;Min guard;Set up   Upper Body Bathing: Minimal assistance;Sitting   Lower Body Bathing: Maximal assistance;+2 for physical assistance;+2 for safety/equipment;Sit to/from stand   Upper Body Dressing : Minimal assistance;Moderate assistance;Sitting   Lower Body Dressing: Maximal assistance;+2 for physical assistance;+2 for safety/equipment;Sit to/from stand Lower Body Dressing Details (indicate cue type  and reason): assist to don socks this session Toilet Transfer: Moderate assistance;+2 for  physical assistance;+2 for safety/equipment;BSC;RW Toilet Transfer Details (indicate cue type and reason): pt took enough steps forward (approx 3) from EOB to slide BSC behind her due to urgency and due to short lines, pt also with difficulty sequencing steps to attempt turning for stand pivot transfer Toileting- Clothing Manipulation and Hygiene: Maximal assistance;+2 for physical assistance;+2 for safety/equipment;Sit to/from stand Toileting - Clothing Manipulation Details (indicate cue type and reason): requires assist for peri-care and clothing management; +2 for standing balance and toileting task; multiple sit<>stands (x3-4) for peri-care as pt only able to maintain standing for few seconds each time before she initiaties sitting down     Functional mobility during ADLs: Moderate assistance;Maximal assistance;+2 for physical assistance;+2 for safety/equipment General ADL Comments: initially used RW progressed to use of Stedy as pt with increased fatigue with attempts to stand during peri-care after toileting     Vision Baseline Vision/History: Wears glasses Wears Glasses: Reading only Patient Visual Report: No change from baseline Additional Comments: pt with delayed processing so difficulty to fully assess today; R eye with increased buildup/redness noted     Perception     Praxis      Pertinent Vitals/Pain Pain Assessment: 0-10 Pain Score: 8  Pain Location: headache(reports decrease in pain with sitting up but scores 10/10) Pain Descriptors / Indicators: Headache Pain Intervention(s): Limited activity within patient's tolerance;Monitored during session;Repositioned     Hand Dominance Right   Extremity/Trunk Assessment Upper Extremity Assessment Upper Extremity Assessment: Generalized weakness(reports increased dizziness with AROM overhead)   Lower Extremity Assessment Lower Extremity Assessment: Defer to PT evaluation   Cervical / Trunk Assessment Cervical / Trunk  Assessment: Kyphotic   Communication Communication Communication: Expressive difficulties   Cognition Arousal/Alertness: Awake/alert Behavior During Therapy: Flat affect Overall Cognitive Status: Impaired/Different from baseline Area of Impairment: Attention;Memory;Following commands;Safety/judgement;Awareness;Problem solving                   Current Attention Level: Selective Memory: Decreased short-term memory(given 3 word to remember, recalled only one with prompt) Following Commands: Follows one step commands with increased time;Follows multi-step commands with increased time;Follows multi-step commands inconsistently Safety/Judgement: Decreased awareness of safety Awareness: Emergent Problem Solving: Slow processing;Decreased initiation;Difficulty sequencing;Requires verbal cues;Requires tactile cues General Comments: pt oriented, however difficulty with remembering where she was living prior to hospitalization, difficulty with scoring pain indicating higher level with less pain    General Comments  VSS, on 2L O2 via [place], SaO2 >93%O2     Exercises     Shoulder Instructions      Home Living Family/patient expects to be discharged to:: Private residence Living Arrangements: Spouse/significant other Available Help at Discharge: Family;Available 24 hours/day Type of Home: House Home Access: Stairs to enter;Ramped entrance Entrance Stairs-Number of Steps: 2 Entrance Stairs-Rails: None Home Layout: Two level;Able to live on main level with bedroom/bathroom     Bathroom Shower/Tub: Walk-in shower;Door   [employer] Toilet: Standard Bathroom Accessibility: Yes   Home Equipment: Cane - single point;Walker - 2 wheels;Shower seat;Wheelchair - manual   Additional Comments: Pt confused about home setup so referred to prior note for home information       Prior Functioning/Environment Level of Independence: Needs assistance  Gait / Transfers Assistance Needed: spouse  assisting with transfers to w/c at home; limited to w/c level since surgery. ADL's / Homemaking Assistance Needed: spouse assisting wtih bathing and dressing  OT Problem List: Decreased strength;Decreased range of motion;Decreased activity tolerance;Impaired balance (sitting and/or standing);Decreased cognition;Decreased safety awareness;Decreased knowledge of use of DME or AE;Obesity;Pain      OT Treatment/Interventions: Self-care/ADL training;Therapeutic exercise;Neuromuscular education;DME and/or AE instruction;Therapeutic activities;Patient/family education;Balance training;Visual/perceptual remediation/compensation;Cognitive remediation/compensation    OT Goals(Current goals can be found in the care plan section) Acute Rehab OT Goals Patient Stated Goal: none stated OT Goal Formulation: With patient Time For Goal Achievement: 07/25/18 Potential to Achieve Goals: Good  OT Frequency: Min 2X/week   Barriers to D/C:            Co-evaluation PT/OT/SLP Co-Evaluation/Treatment: Yes Reason for Co-Treatment: For patient/therapist safety;To address functional/ADL transfers   OT goals addressed during session: ADL's and self-care;Proper use of Adaptive equipment and DME      AM-PAC OT "6 Clicks" Daily Activity     Outcome Measure Help from another person eating meals?: A Little Help from another person taking care of personal grooming?: A Little Help from another person toileting, which includes using toliet, bedpan, or urinal?: A Lot Help from another person bathing (including washing, rinsing, drying)?: A Lot Help from another person to put on and taking off regular upper body clothing?: A Lot Help from another person to put on and taking off regular lower body clothing?: A Lot 6 Click Score: 14   End of Session Equipment Utilized During Treatment: Gait belt;Oxygen Nurse Communication: Mobility status;Need for lift equipment  Activity Tolerance: Patient tolerated  treatment well Patient left: in chair;with call bell/phone within reach;with chair alarm set  OT Visit Diagnosis: Muscle weakness (generalized) (M62.81);Other symptoms and signs involving cognitive function;Unsteadiness on feet (R26.81)                Time: 1448-1856 OT Time Calculation (min): 42 min Charges:  OT General Charges $OT Visit: 1 Visit OT Evaluation $OT Eval Moderate Complexity: [place], OT [employer] Pager [phone] Office [phone]   Raymondo Band 07/11/2018, 2:04 PM

## 2018-07-12 LAB — GLUCOSE, CAPILLARY
Glucose-Capillary: 98 mg/dL (ref 70–99)
Glucose-Capillary: 154 mg/dL — ABNORMAL HIGH (ref 70–99)
Glucose-Capillary: 139 mg/dL — ABNORMAL HIGH (ref 70–99)
Glucose-Capillary: 100 mg/dL — ABNORMAL HIGH (ref 70–99)

## 2018-07-12 MED ORDER — CHLORHEXIDINE GLUCONATE CLOTH 2 % EX PADS
6.0000 | MEDICATED_PAD | Freq: Every day | CUTANEOUS | Status: DC
  Administered 2018-07-12 – 2018-07-22 (×7): 6 via TOPICAL

## 2018-07-12 MED ORDER — SODIUM CHLORIDE 0.9 % IV SOLN
2.0000 g | Freq: Three times a day (TID) | INTRAVENOUS | Status: DC
  Administered 2018-07-12 – 2018-07-13 (×4): 2 g via INTRAVENOUS
  Filled 2018-07-12 (×7): qty 2000

## 2018-07-12 MED ORDER — AMPICILLIN IV (FOR PTA / DISCHARGE USE ONLY): 2.0000 g | Freq: Three times a day (TID) | INTRAVENOUS | 0 refills | Status: DC

## 2018-07-12 NOTE — Progress Notes (Signed)
Upon entering room noted patient at [place] in bed. Had pulled pure out from between legs. Patient notes head hurts and she has to pee. Purewick replaced and patient repositioned in bed, hob 30degrees. Voided 250cc via purewick into suction catheter. BP check high. PRN Labetolol and IV dilaudid given. Recheck BP decreased after one 20mg  dose of labetolol. Resting quietly in bed. Vitals stable.  [place] Faster

## 2018-07-12 NOTE — Plan of Care (Signed)

## 2018-07-12 NOTE — TOC Initial Note (Signed)
Transition of Care [facility]) - Initial/Assessment Note    Patient Details  Name: Natalie Friedman MRN: [id number] Date of Birth: 1950/11/19  Transition of Care [facility]) CM/SW Contact:    Ella Bodo, RN Phone Number: 07/12/2018, 4:40 PM  Clinical Narrative:  68 y.o. 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 Admitted 2/24 for  pseudomeningocele with CSF leakage, 2/25 lumbar subarachnoid drain placed remained in hospital until 3/7 d/c'd home. 3/17 returned to hospital due to worsening mental status secondary to shunt malfunction. 3/18 shunt revision performed and d/c'd home. After additional decline in mental status admitted  4/1 returned to hospital for revision of left ventriculoperitoneal shunt.   PTA, pt required assistance with ADLS, and is cared for by husband Aaron Edelman.  With pt's permission, called spouse to discuss dc plans.  PT/OT recommending CIR, but spouse prefers pt come home with [facility] services.  Pt active with [facility] for HHRN/PT and OT.  Will need IV antibiotics at discharge, and husband states he is willing and able to perform IV infusions with teaching.  We discussed that he may have to come in for teaching (this should be allowed, even with visitor restrictions) prior to pt discharging home.  Husband states he will be available for any teaching sessions available.  Notified [facility] of possible dc on Monday, 4/6.  Referral to [facility] for Home IV antibiotic therapy.  (spoke with Carolynn Sayers, IV infusion coordinator. )   Spouse states pt has all needed DME at home.  Will follow progress.                   Expected Discharge Plan: [place] Barriers to Discharge: Continued Medical Work up   Patient Goals and CMS Choice Patient states their goals for this hospitalization and ongoing recovery are:: Go home CMS Medicare.gov Compare Post Acute Care list provided to:: Patient Choice  offered to / list presented to : Spouse  Expected Discharge Plan and Services Expected Discharge Plan: [place]   Discharge Planning Services: CM Consult Post Acute Care Choice: Home Health                       HH Arranged: RN, PT, OT, IV Antibiotics HH Agency: [place] ([facility]), Other - See comment  Prior Living Arrangements/Services   Lives with:: Spouse Patient language and need for interpreter reviewed:: No Do you feel safe going back to the place where you live?: Yes      Need for Family Participation in Patient Care: Yes (Comment) Care giver support system in place?: Yes (comment) Current home services: DME, Home RN Criminal Activity/Legal Involvement Pertinent to Current Situation/Hospitalization: No - Comment as needed  Activities of Daily Living Home Assistive Devices/Equipment: Wheelchair, Environmental consultant (specify type) ADL Screening (condition at time of admission) Patient's cognitive ability adequate to safely complete daily activities?: Yes Patient able to express need for assistance with ADLs?: Yes Independently performs ADLs?: No Communication: Independent Dressing (OT): Needs assistance Is this a change from baseline?: Pre-admission baseline Grooming: Needs assistance Is this a change from baseline?: Pre-admission baseline Feeding: Needs assistance Is this a change from baseline?: Pre-admission baseline Bathing: Needs assistance Is this a change from baseline?: Pre-admission baseline Toileting: Needs assistance Is this a change from baseline?: Pre-admission baseline In/Out Bed: Needs assistance Is this a change from baseline?: Pre-admission baseline Walks in Home: Needs  assistance Is this a change from baseline?: Pre-admission baseline Does the patient have difficulty walking or climbing stairs?: Yes Weakness of Legs: Both Weakness of Arms/Hands: Both  Permission Sought/Granted Permission sought to share information with :  Case Manager, Family Supports Permission granted to share information with : Yes, Verbal Permission Granted  Share Information with NAME: Spouse, [place] granted to share info w AGENCY: [place], [facility]        Emotional Assessment Appearance:: Appears stated age   Affect (typically observed): Accepting, Appropriate Orientation: : Oriented to Self, Oriented to Place, Oriented to  Time, Oriented to Situation Alcohol / Substance Use: Not Applicable Psych Involvement: No (comment)  Admission diagnosis:  Shunt malfunction Patient Active Problem List   Diagnosis Date Noted  . 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 Suture reaction 05/10/2018  . Acute on chronic renal failure ([place])   . Hypertension   . Diabetes mellitus type 2 in obese ([place])   . CKD (chronic kidney disease)   . 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 Hypoalbuminemia due to protein-calorie malnutrition ([place])   . Essential hypertension   . Acute blood loss anemia   . Vestibular dysfunction of right ear   . Vestibular schwannoma ([address]) 04/16/2018  . Brain tumor ([place]) 04/16/2018  . Cellulitis and abscess of right leg   . 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 Primary osteoarthritis of right knee   . Nephrotic syndrome 05/15/2016  . CHF (congestive heart failure) ([place]) 05/12/2016  . DM (diabetes mellitus), type 2 with renal complications ([place]) 57/90/3833  . Hypertensive urgency 05/12/2016  . anasarca 05/12/2016  . Immobility 05/12/2016  . Proteinuria 05/12/2016  . Uncontrolled hypertension 08/19/2015   PCP:  Celene Squibb, MD Pharmacy:   [place], [place] [address] [place] [address] Phone: [phone] Fax: [phone]     Social Determinants of Health (SDOH) Interventions    Readmission Risk Interventions No flowsheet  data found.   Reinaldo Raddle, RN, BSN  Trauma/Neuro ICU Case Manager [phone]

## 2018-07-12 NOTE — Progress Notes (Addendum)
Neurosurgery Service Progress Note  Subjective: No acute events overnight, no new complaints this morning, seems more oriented and interactive than preop, complaining of some headaches this morning  Objective: Vitals:   07/11/18 2128 07/12/18 0049 07/12/18 0335 07/12/18 0813  BP: (!) 148/84 138/83 (!) 153/92 (!) 156/98  Pulse: 71 68 85 90  Resp: 20 18 (!) 22 19  Temp: 97.7 F (36.5 C) 97.7 F (36.5 C) (!) 97.5 F (36.4 C)   TempSrc: Oral Axillary Axillary   SpO2: 96% 98% 99% 94%  Weight:      Height:       Temp (24hrs), Avg:97.7 F (36.5 C), Min:97.5 F (36.4 C), Max:97.8 F (36.6 C)  CBC Latest Ref Rng & Units 07/10/2018 06/26/2018 06/07/2018  WBC 4.0 - 10.5 K/uL 14.0(H) 8.0 6.2  Hemoglobin 12.0 - 15.0 g/dL 13.3 12.5 11.6(L)  Hematocrit 36.0 - 46.0 % 40.2 39.7 35.6(L)  Platelets 150 - 400 K/uL 161 188 124(L)   BMP Latest Ref Rng & Units 07/10/2018 06/26/2018 06/07/2018  Glucose 70 - 99 mg/dL - 96 96  BUN 8 - 23 mg/dL - 35(H) 51(H)  Creatinine 0.44 - 1.00 mg/dL 2.80(H) 2.56(H) 2.75(H)  Sodium 135 - 145 mmol/L - 139 137  Potassium 3.5 - 5.1 mmol/L - 4.2 5.2(H)  Chloride 98 - 111 mmol/L - 108 104  CO2 22 - 32 mmol/L - 22 23  Calcium 8.9 - 10.3 mg/dL - 10.2 9.8    Intake/Output Summary (Last 24 hours) at 07/12/2018 9485 Last data filed at 07/12/2018 0500 Gross per 24 hour  Intake 240 ml  Output 350 ml  Net -110 ml    Current Facility-Administered Medications:  .  acetaminophen (TYLENOL) tablet 650 mg, 650 mg, Oral, Q4H PRN **OR** acetaminophen (TYLENOL) suppository 650 mg, 650 mg, Rectal, Q4H PRN, Nichalas Coin A, MD .  amLODipine (NORVASC) tablet 5 mg, 5 mg, Oral, QPM, Tadarrius Burch, Joyice Faster, MD, 5 mg at 07/10/18 1806 .  ampicillin (OMNIPEN) 2 g in sodium chloride 0.9 % 100 mL IVPB, 2 g, Intravenous, Q8H, Athene Schuhmacher A, MD .  carvedilol (COREG) tablet 12.5 mg, 12.5 mg, Oral, Daily, Jovany Disano A, MD, 12.5 mg at 07/11/18 0949 .  Chlorhexidine Gluconate Cloth 2 %  PADS 6 each, 6 each, Topical, Daily, Cornie Mccomber A, MD .  docusate sodium (COLACE) capsule 100 mg, 100 mg, Oral, BID, Pike Scantlebury A, MD, 100 mg at 07/11/18 2151 .  heparin injection 5,000 Units, 5,000 Units, Subcutaneous, Q8H, Reine Bristow A, MD .  HYDROcodone-acetaminophen (NORCO/VICODIN) 5-325 MG per tablet 1 tablet, 1 tablet, Oral, Q4H PRN, Judith Part, MD .  HYDROmorphone (DILAUDID) injection 0.5 mg, 0.5 mg, Intravenous, Q3H PRN, Judith Part, MD, 0.5 mg at 07/11/18 2239 .  insulin detemir (LEVEMIR) injection 10 Units, 10 Units, Subcutaneous, QPM, Mychal Decarlo A, MD .  labetalol (NORMODYNE,TRANDATE) injection 10-40 mg, 10-40 mg, Intravenous, Q10 min PRN, Maylin Freeburg A, MD .  mirtazapine (REMERON) tablet 7.5 mg, 7.5 mg, Oral, QHS, Tonique Mendonca A, MD, 7.5 mg at 07/11/18 2151 .  naphazoline-glycerin (CLEAR EYES REDNESS) ophth solution 1 drop, 1 drop, Right Eye, QID, Judith Part, MD, 1 drop at 07/11/18 2151 .  ondansetron (ZOFRAN) tablet 4 mg, 4 mg, Oral, Q4H PRN **OR** ondansetron (ZOFRAN) injection 4 mg, 4 mg, Intravenous, Q4H PRN, Maimuna Leaman A, MD .  polyethylene glycol (MIRALAX / GLYCOLAX) packet 17 g, 17 g, Oral, Daily PRN, Judith Part, MD .  promethazine (PHENERGAN) tablet 12.5-25  mg, 12.5-25 mg, Oral, Q4H PRN, Shaft Corigliano A, MD .  sodium chloride flush (NS) 0.9 % injection 10-40 mL, 10-40 mL, Intracatheter, Q12H, Norie Latendresse, Joyice Faster, MD, 10 mL at 07/11/18 2151 .  sodium chloride flush (NS) 0.9 % injection 10-40 mL, 10-40 mL, Intracatheter, PRN, Judith Part, MD   Physical Exam: Awake/alert and interactive, Ox1, PERRL, EOMI, +R HB5, Strength 5/5 x4 Shunt incisions c/d/i R retrosig incision - pseudomeningocele soft, no leakage of CSF  Assessment & Plan: 67 y.o. 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  -pseudomeningocele flat, no CSF leakage, which is reassuring that  her shunt is draining sufficiently -CSF Cx growing E faecalis, ampicillin susceptible, will transition ceftriaxone to ampicillin -will need to arrange home health for ABx admin, will need 14d of Tx -given that her shunt system was already in place after ABx were administered, will try to treat through, will d/w pt's husband - in the literature there is roughly a 35% chance that she will require hardware removal / externalization / sterilization / reimplantation -will start process to arrange for home health for antibiotic administration -given her new headache, will keep her over the weekend to monitor whether or not she is getting over-drainage symptoms -SCDs/TEDs, SQH  Judith Part  07/12/18 8:32 AM

## 2018-07-13 LAB — CSF CULTURE

## 2018-07-13 LAB — GLUCOSE, CAPILLARY
Glucose-Capillary: 71 mg/dL (ref 70–99)
Glucose-Capillary: 135 mg/dL — ABNORMAL HIGH (ref 70–99)
Glucose-Capillary: 120 mg/dL — ABNORMAL HIGH (ref 70–99)

## 2018-07-13 LAB — CSF CULTURE W GRAM STAIN

## 2018-07-13 MED ORDER — AMPICILLIN 500 MG PO CAPS
500.0000 mg | ORAL_CAPSULE | Freq: Four times a day (QID) | ORAL | Status: DC
  Administered 2018-07-13 – 2018-07-16 (×10): 500 mg via ORAL
  Filled 2018-07-13 (×13): qty 1

## 2018-07-13 NOTE — Progress Notes (Signed)
Occupational Therapy Treatment Patient Details Name: Natalie Friedman MRN: [id number] DOB: 07/11/1950 Today's Date: 07/13/2018    History of present illness 68 y.o. 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 Admitted 2/24 for  pseudomeningocele with CSF leakage, 2/25 lumbar subarachnoid drain placed remained in hospital until 3/7 d/c'd home. 3/17 returned to hospital due to worsening mental status secondary to shunt malfunction. 3/18 shunt revision performed and d/c'd home. After additional decline in mental status admitted  4/1 returned to hospital for revision of left ventriculoperitoneal shunt.    OT comments  Pt. With limited participation this OT session.  Pt. Initially agreeable to HEP with focus on BUE exercises, then when it was time to initiate and complete she began to refuse citing various reasons.  rn notified of pts. Behavior changes during session.    Follow Up Recommendations  CIR;Supervision/Assistance - 24 hour -note pt. To return home as family is refusing CIR. Pt. Will need 24/7 if this is the case   Equipment Recommendations       Recommendations for Other Services      Precautions / Restrictions Precautions Precautions: Fall Restrictions Weight Bearing Restrictions: No       Mobility Bed Mobility Overal bed mobility: Needs Assistance Bed Mobility: Sit to Supine       Sit to supine: Max assist;+2 for physical assistance   General bed mobility comments: pt had been incontinent in standing and returned to bed to be cleaned up (for safety)  Transfers Overall transfer level: Needs assistance Equipment used: Rolling walker (2 wheeled) Transfers: Sit to/from Stand Sit to Stand: Mod assist;+2 physical assistance;+2 safety/equipment;Max assist         General transfer comment: sit>stand from chairx 3, attempted transfer to [facility] but could not safely pivot; last sit>stand to [facility] x2  with modAx2 for transfer recliner back to bed for cleaning    Balance Overall balance assessment: Needs assistance Sitting-balance support: Bilateral upper extremity supported;Feet supported Sitting balance-Leahy Scale: Fair     Standing balance support: Bilateral upper extremity supported Standing balance-Leahy Scale: Poor Standing balance comment: with RW and then with stedy                           ADL either performed or assessed with clinical judgement   ADL Overall ADL's : Needs assistance/impaired Eating/Feeding: Bed level Eating/Feeding Details (indicate cue type and reason): assisted pt. with cutting chicken and carrots. pt. refusing to eat and only wanted ice water, rn notified                                         Vision       Perception     Praxis      Cognition Arousal/Alertness: Awake/alert Behavior During Therapy: Flat affect Overall Cognitive Status: No family/caregiver present to determine baseline cognitive functioning Area of Impairment: Attention;Memory;Following commands;Safety/judgement;Awareness;Problem solving;Orientation                 Orientation Level: Situation;Place Current Attention Level: Sustained Memory: Decreased short-term memory Following Commands: Follows one step commands with increased time Safety/Judgement: Decreased awareness of safety Awareness: Emergent Problem Solving: Slow processing;Decreased initiation;Difficulty sequencing;Requires verbal cues;Requires tactile cues General Comments: thought her sister was in apartment downstairs, wanted to go "into that other room"  Exercises Other Exercises Other Exercises: attempted introduction to HEP with focus on B UES.  pt. jerking arms away and stating "no i dont want to do that you are too cheery about it".  unable to clarify what she meant or what bothered her.  multiple attempts to engage in b ue exercises. completed less than 5 reps  on each arm for biceps, mostly PROM would not actively engage even with max encouragement.    Shoulder Instructions       General Comments per RN, more confused (pulled out PICC line earlier)    Pertinent Vitals/ Pain       Pain Assessment: Faces Faces Pain Scale: No hurt Pain Location: (reports decrease in pain with sitting up but scores 10/10)  Home Living                                          Prior Functioning/Environment              Frequency  Min 2X/week        Progress Toward Goals  OT Goals(current goals can now be found in the care plan section)  Progress towards OT goals: Not progressing toward goals - comment(pt. not wanting to complete tasks this day)  Acute Rehab OT Goals Patient Stated Goal: none stated  Plan Discharge plan needs to be updated    Co-evaluation                 AM-PAC OT "6 Clicks" Daily Activity     Outcome Measure   Help from another person eating meals?: A Little Help from another person taking care of personal grooming?: A Little Help from another person toileting, which includes using toliet, bedpan, or urinal?: A Lot Help from another person bathing (including washing, rinsing, drying)?: A Lot Help from another person to put on and taking off regular upper body clothing?: A Lot Help from another person to put on and taking off regular lower body clothing?: A Lot 6 Click Score: 14    End of Session    OT Visit Diagnosis: Muscle weakness (generalized) (M62.81);Other symptoms and signs involving cognitive function;Unsteadiness on feet (R26.81)   Activity Tolerance Other (comment)(pt. declining attempts at therapeutic tasks)   Patient Left in bed;with call bell/phone within reach;with bed alarm set   Nurse Communication Other (comment)(reviewed with RN pt. refusal to eat lunch, request for water and noted agitation and confusion. )        Time: 9417-4081 OT Time Calculation (min): 8  min  Charges: OT General Charges $OT Visit: 1 Visit OT Treatments $Therapeutic Exercise: 8-22 mins   Janice Coffin, COTA/L 07/13/2018, 1:31 PM

## 2018-07-13 NOTE — Progress Notes (Signed)
No new issues or problems.  Patient remains disoriented.  Following commands otherwise.  Wounds clean and dry.  She is afebrile.  Her vital signs are stable.  Continue current management.

## 2018-07-13 NOTE — Plan of Care (Signed)
  Problem: Clinical Measurements: Goal: Ability to maintain clinical measurements within normal limits will improve Outcome: Progressing   Problem: Nutrition: Goal: Adequate nutrition will be maintained Outcome: Not Progressing   Problem: Safety: Goal: Ability to remain free from injury will improve Outcome: Progressing

## 2018-07-13 NOTE — Progress Notes (Signed)
Physical Therapy Treatment Patient Details Name: Natalie Friedman MRN: [id number] DOB: 1950-12-11 Today's Date: 07/13/2018    History of Present Illness 68 y.o. 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 Admitted 2/24 for  pseudomeningocele with CSF leakage, 2/25 lumbar subarachnoid drain placed remained in hospital until 3/7 d/c'd home. 3/17 returned to hospital due to worsening mental status secondary to shunt malfunction. 3/18 shunt revision performed and d/c'd home. After additional decline in mental status admitted  4/1 returned to hospital for revision of left ventriculoperitoneal shunt.     PT Comments    Patient up in recliner on arrival. Per RN pt had pulled out PICC line and disconnected pure wick this morning. Session focused on transfer training with pt unable to safely pivot to [facility] prior to incontinent of urine. Patient was able to follow simple commands with incr time and demonstration. She quickly fatigued with standing to attempt to clean her up and ultimately had to use Stedy for return to bed for safe pericare. (RN and NT in to assist with this). Noted patient's husband told CM he does not want to pursue CIR (although feel pt could benefit). Discharge recommendation updated to reflect husband's request.      Follow Up Recommendations  Home health PT;Supervision/Assistance - 24 hour(per CM note, husband prefers [facility])     Equipment Recommendations  None recommended by PT    Recommendations for Other Services       Precautions / Restrictions Precautions Precautions: Fall Restrictions Weight Bearing Restrictions: No    Mobility  Bed Mobility Overal bed mobility: Needs Assistance Bed Mobility: Sit to Supine       Sit to supine: Max assist;+2 for physical assistance   General bed mobility comments: pt had been incontinent in standing and returned to bed to be cleaned up (for  safety)  Transfers Overall transfer level: Needs assistance Equipment used: Rolling walker (2 wheeled) Transfers: Sit to/from Stand Sit to Stand: Mod assist;+2 physical assistance;+2 safety/equipment;Max assist         General transfer comment: sit>stand from chairx 3, attempted transfer to [facility] but could not safely pivot; last sit>stand to [facility] x2 with modAx2 for transfer recliner back to bed for cleaning  Ambulation/Gait                 Stairs             Wheelchair Mobility    Modified Rankin (Stroke Patients Only)       Balance Overall balance assessment: Needs assistance Sitting-balance support: Bilateral upper extremity supported;Feet supported Sitting balance-Leahy Scale: Fair     Standing balance support: Bilateral upper extremity supported Standing balance-Leahy Scale: Poor Standing balance comment: with RW and then with stedy                            Cognition Arousal/Alertness: Awake/alert Behavior During Therapy: Flat affect Overall Cognitive Status: No family/caregiver present to determine baseline cognitive functioning Area of Impairment: Attention;Memory;Following commands;Safety/judgement;Awareness;Problem solving;Orientation                 Orientation Level: Situation;Place Current Attention Level: Sustained Memory: Decreased short-term memory Following Commands: Follows one step commands with increased time Safety/Judgement: Decreased awareness of safety Awareness: Emergent Problem Solving: Slow processing;Decreased initiation;Difficulty sequencing;Requires verbal cues;Requires tactile cues General Comments: thought her sister was in apartment downstairs, wanted to go "into that other room"  Exercises      General Comments General comments (skin integrity, edema, etc.): per RN, more confused (pulled out PICC line earlier)      Pertinent Vitals/Pain Pain Assessment: Faces Faces Pain Scale: No  hurt Pain Location: (reports decrease in pain with sitting up but scores 10/10)    Home Living                      Prior Function            PT Goals (current goals can now be found in the care plan section) Acute Rehab PT Goals Patient Stated Goal: none stated Progress towards PT goals: Not progressing toward goals - comment(pt more confused today)    Frequency           PT Plan Discharge plan needs to be updated(could benefit from CIR; husband prefers HHPT per chart)    Co-evaluation              AM-PAC PT "6 Clicks" Mobility   Outcome Measure  Help needed turning from your back to your side while in a flat bed without using bedrails?: Total Help needed moving from lying on your back to sitting on the side of a flat bed without using bedrails?: Total Help needed moving to and from a bed to a chair (including a wheelchair)?: A Lot Help needed standing up from a chair using your arms (e.g., wheelchair or bedside chair)?: A Lot Help needed to walk in hospital room?: Total Help needed climbing 3-5 steps with a railing? : Total 6 Click Score: 8    End of Session Equipment Utilized During Treatment: Gait belt Activity Tolerance: Patient limited by fatigue(each transfer became more fatigued with incr assist needed) Patient left: in bed;with call bell/phone within reach;with bed alarm set(OT arriving on departure) Nurse Communication: Mobility status;Need for lift equipment;Other (comment)(incontinent/pure wick pt had disconnected) PT Visit Diagnosis: Unsteadiness on feet (R26.81);Other abnormalities of gait and mobility (R26.89);Muscle weakness (generalized) (M62.81);Difficulty in walking, not elsewhere classified (R26.2);Dizziness and giddiness (R42);Other symptoms and signs involving the nervous system (R29.898);Pain     Time: 2330-0762 PT Time Calculation (min) (ACUTE ONLY): 37 min  Charges:  $Therapeutic Activity: 23-37 mins                         Jeanie Cooks [place], PT [phone] 07/13/2018, 12:33 PM

## 2018-07-14 LAB — GLUCOSE, CAPILLARY
Glucose-Capillary: 86 mg/dL (ref 70–99)
Glucose-Capillary: 131 mg/dL — ABNORMAL HIGH (ref 70–99)
Glucose-Capillary: 151 mg/dL — ABNORMAL HIGH (ref 70–99)

## 2018-07-14 NOTE — Progress Notes (Signed)
Tharon Aquas, NP said to give morning coreg.

## 2018-07-14 NOTE — Progress Notes (Signed)
BP=168/115. HR=89. Temp=97.5 F. Pt is shivering and tense. Pt refuses to eat and drink. Consult was placed for IV restart. Once, is placed pt will need to receive IV labetalol.

## 2018-07-14 NOTE — Progress Notes (Signed)
Patient has been yelling out at intervals since the beginning of the shift restless, attempting to get out of the bed. When asked what is wrong or how I could help her she stated "I hurt all over."  Patient given 0.5mg  Hydromorphone IV as ordered patient states " I feel better." O2 Sats dropped and patient placed pon continous monitoring  with  2L/[place] continuously.

## 2018-07-14 NOTE — Progress Notes (Signed)
Pt declined breakfasts and AM meds. Will continue to encouragement of oral intake.  Ave Filter, RN

## 2018-07-14 NOTE — Progress Notes (Signed)
Patient overall stable.  She is awake.  She is oriented x1.  She will follow commands with great prompting.  She is afebrile.  Her wounds are clean and dry.  Continue antibiotics and observation.

## 2018-07-14 NOTE — Progress Notes (Signed)
IV team was unable to restart IV and will try again later under [place]. Neurosurgery answering service was paged; awaiting response.

## 2018-07-15 ENCOUNTER — Inpatient Hospital Stay (HOSPITAL_COMMUNITY): Payer: Medicare Other

## 2018-07-15 LAB — GLUCOSE, CAPILLARY
Glucose-Capillary: 79 mg/dL (ref 70–99)
Glucose-Capillary: 140 mg/dL — ABNORMAL HIGH (ref 70–99)
Glucose-Capillary: 97 mg/dL (ref 70–99)
Glucose-Capillary: 105 mg/dL — ABNORMAL HIGH (ref 70–99)
Glucose-Capillary: 93 mg/dL (ref 70–99)

## 2018-07-15 LAB — URINALYSIS, ROUTINE W REFLEX MICROSCOPIC
Bilirubin Urine: NEGATIVE
Glucose, UA: NEGATIVE mg/dL
Ketones, ur: NEGATIVE mg/dL
Nitrite: NEGATIVE
Protein, ur: 100 mg/dL — AB
Specific Gravity, Urine: 1.013 (ref 1.005–1.030)
pH: 5 (ref 5.0–8.0)

## 2018-07-15 LAB — CBC WITH DIFFERENTIAL/PLATELET
Abs Immature Granulocytes: 0.03 10*3/uL (ref 0.00–0.07)
Basophils Absolute: 0 10*3/uL (ref 0.0–0.1)
Basophils Relative: 0 %
Eosinophils Absolute: 0.1 10*3/uL (ref 0.0–0.5)
Eosinophils Relative: 1 %
HCT: 37.8 % (ref 36.0–46.0)
Hemoglobin: 11.8 g/dL — ABNORMAL LOW (ref 12.0–15.0)
Immature Granulocytes: 1 %
Lymphocytes Relative: 19 %
Lymphs Abs: 1.2 10*3/uL (ref 0.7–4.0)
MCH: 29.5 pg (ref 26.0–34.0)
MCHC: 31.2 g/dL (ref 30.0–36.0)
MCV: 94.5 fL (ref 80.0–100.0)
Monocytes Absolute: 0.6 10*3/uL (ref 0.1–1.0)
Monocytes Relative: 10 %
Neutro Abs: 4.4 10*3/uL (ref 1.7–7.7)
Neutrophils Relative %: 69 %
Platelets: 186 10*3/uL (ref 150–400)
RBC: 4 MIL/uL (ref 3.87–5.11)
RDW: 13 % (ref 11.5–15.5)
WBC: 6.3 10*3/uL (ref 4.0–10.5)
nRBC: 0 % (ref 0.0–0.2)

## 2018-07-15 LAB — AST: AST: 13 U/L — ABNORMAL LOW (ref 15–41)

## 2018-07-15 LAB — RENAL FUNCTION PANEL
Albumin: 2.6 g/dL — ABNORMAL LOW (ref 3.5–5.0)
Anion gap: 8 (ref 5–15)
BUN: 27 mg/dL — ABNORMAL HIGH (ref 8–23)
CO2: 29 mmol/L (ref 22–32)
Calcium: 8.6 mg/dL — ABNORMAL LOW (ref 8.9–10.3)
Chloride: 108 mmol/L (ref 98–111)
Creatinine, Ser: 2.13 mg/dL — ABNORMAL HIGH (ref 0.44–1.00)
GFR calc Af Amer: 26 mL/min — ABNORMAL LOW (ref >60)
GFR calc non Af Amer: 23 mL/min — ABNORMAL LOW (ref >60)
Glucose, Bld: 117 mg/dL — ABNORMAL HIGH (ref 70–99)
Phosphorus: 2.5 mg/dL (ref 2.5–4.6)
Potassium: 3.3 mmol/L — ABNORMAL LOW (ref 3.5–5.1)
Sodium: 145 mmol/L (ref 135–145)

## 2018-07-15 LAB — ALT: ALT: 5 U/L (ref 0–44)

## 2018-07-15 LAB — ALKALINE PHOSPHATASE: Alkaline Phosphatase: 44 U/L (ref 38–126)

## 2018-07-15 LAB — BILIRUBIN, TOTAL: Total Bilirubin: 0.5 mg/dL (ref 0.3–1.2)

## 2018-07-15 LAB — BILIRUBIN, DIRECT: Bilirubin, Direct: 0.1 mg/dL (ref 0.0–0.2)

## 2018-07-15 LAB — PROTEIN, TOTAL: Total Protein: 5.8 g/dL — ABNORMAL LOW (ref 6.5–8.1)

## 2018-07-15 NOTE — Progress Notes (Signed)
Dr Arnoldo Morale returned call immediately with orders.  Foley cath inserted with 2000 cc of output and foley clamped, UA and culture sent to the lab. Patient given bath and now resting well.

## 2018-07-15 NOTE — Progress Notes (Addendum)
Occupational Therapy Treatment Patient Details Name: Natalie Friedman MRN: [id number] DOB: 1950/09/28 Today's Date: 07/15/2018    History of present illness 68 y.o. 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 Admitted 2/24 for  pseudomeningocele with CSF leakage, 2/25 lumbar subarachnoid drain placed remained in hospital until 3/7 d/c'd home. 3/17 returned to hospital due to worsening mental status secondary to shunt malfunction. 3/18 shunt revision performed and d/c'd home. After additional decline in mental status admitted  4/1 returned to hospital for revision of left ventriculoperitoneal shunt.    OT comments  Pt presents supine in bed agreeable to working with therapy. Pt continues to require heavy two person assist for safe completion of functional transfers. Required minA for simple grooming ADL seated EOB and to setup lunch tray end of session. Pt continues to demonstrate impaired cognition, is mildly impulsive and requires reminders and repetition to recall she is in the hospital; pt often perseverating on words, responding to questions with words or statements unrelated to question asked. Continue to recommend post acute rehab services at time of discharge however noted pt's spouse currently refusing; if pt is to return home she will require hands on physical assist (likely two person assist) as well as 24hr supervision. Will continue to follow acutely to progress pt towards established OT goals.   Follow Up Recommendations  CIR;Supervision/Assistance - 24 hour(per chart spouse currently refusing, will need 24hr at home)    Equipment Recommendations  Wheelchair (measurements OT);Wheelchair cushion (measurements OT);3 in 1 bedside commode          Precautions / Restrictions Precautions Precautions: Fall Restrictions Weight Bearing Restrictions: No       Mobility Bed Mobility Overal bed mobility: Needs  Assistance Bed Mobility: Supine to Sit     Supine to sit: Max assist;+2 for physical assistance;Mod assist     General bed mobility comments: pt with no initiation of task, requires max verbal and tactile cues to bring LEs to EOB and then maxA for trunk elevation  Transfers Overall transfer level: Needs assistance Equipment used: Rolling walker (2 wheeled)(2 person lift with gait belt and bed pad) Transfers: Sit to/from Stand;Stand Pivot Transfers Sit to Stand: Max assist;+2 physical assistance Stand pivot transfers: Max assist;+2 physical assistance       General transfer comment: pt initiating leaning forward but unable to power up and clear bottom. pt stood up 1 time to RW however pt then immeadiately sat down impulsively. Pt then complete sit to stand with 2 person lift with gait belt and bed pad underneath her. posterior pressure on hips allowed pt to maintain/tolerate standing position long enough for std pvt, pt required maxA to moved R LE over towards chair to complete std pvt to chair    Balance Overall balance assessment: Needs assistance Sitting-balance support: Bilateral upper extremity supported;Feet supported Sitting balance-Leahy Scale: Fair Sitting balance - Comments: progressed from Zero balance to fair with sitting 5 minutes EoB   Standing balance support: Bilateral upper extremity supported Standing balance-Leahy Scale: Poor Standing balance comment: dependent on physical assist                           ADL either performed or assessed with clinical judgement   ADL Overall ADL's : Needs assistance/impaired Eating/Feeding: Minimal assistance;Set up;Sitting Eating/Feeding Details (indicate cue type and reason): requires assist to open containers, cut up food  Grooming: Minimal assistance;Sitting  Grooming Details (indicate cue type and reason): assist to rub in hand sanitizer seated EOB         Upper Body Dressing : Moderate  assistance;Sitting Upper Body Dressing Details (indicate cue type and reason): donning second gown Lower Body Dressing: Maximal assistance;Total assistance;+2 for physical assistance;+2 for safety/equipment;Sit to/from stand Lower Body Dressing Details (indicate cue type and reason): assist to don socks at bed level             Functional mobility during ADLs: Maximal assistance;Total assistance;+2 for physical assistance;+2 for safety/equipment General ADL Comments: pt with increased confusion today and decreased tolerance to seated/standing activity      Vision       Perception     Praxis      Cognition Arousal/Alertness: Awake/alert Behavior During Therapy: Flat affect Overall Cognitive Status: Impaired/Different from baseline Area of Impairment: Orientation;Attention;Memory;Following commands;Safety/judgement;Awareness;Problem solving                 Orientation Level: Disoriented to;Place;Time;Situation Current Attention Level: Focused Memory: Decreased short-term memory Following Commands: Follows one step commands with increased time;Follows one step commands inconsistently Safety/Judgement: Decreased awareness of safety;Decreased awareness of deficits Awareness: Intellectual Problem Solving: Slow processing;Decreased initiation;Difficulty sequencing;Requires verbal cues;Requires tactile cues General Comments: pt perseverating on words, pt confused, unable to recall she is in the hospital depiste max verbal cues to re-orient patient, pt unable to maintain attention to task greater than 10 sec        Exercises     Shoulder Instructions       General Comments VSS    Pertinent Vitals/ Pain       Pain Assessment: Faces Faces Pain Scale: Hurts a little bit Pain Location: pt rubbing behind L ear Pain Descriptors / Indicators: Grimacing Pain Intervention(s): Limited activity within patient's tolerance;Monitored during session  Home Living                                           Prior Functioning/Environment              Frequency  Min 2X/week        Progress Toward Goals  OT Goals(current goals can now be found in the care plan section)  Progress towards OT goals: OT to reassess next treatment  Acute Rehab OT Goals Patient Stated Goal: none stated OT Goal Formulation: With patient Time For Goal Achievement: 07/25/18 Potential to Achieve Goals: Good  Plan Discharge plan remains appropriate    Co-evaluation    PT/OT/SLP Co-Evaluation/Treatment: Yes Reason for Co-Treatment: Complexity of the patient's impairments (multi-system involvement);Necessary to address cognition/behavior during functional activity;To address functional/ADL transfers PT goals addressed during session: Mobility/safety with mobility OT goals addressed during session: ADL's and self-care      AM-PAC OT "6 Clicks" Daily Activity     Outcome Measure   Help from another person eating meals?: A Little Help from another person taking care of personal grooming?: A Little Help from another person toileting, which includes using toliet, bedpan, or urinal?: A Lot Help from another person bathing (including washing, rinsing, drying)?: A Lot Help from another person to put on and taking off regular upper body clothing?: A Lot Help from another person to put on and taking off regular lower body clothing?: A Lot 6 Click Score: 14    End of Session Equipment Utilized During Treatment: Gait belt  OT Visit  Diagnosis: Muscle weakness (generalized) (M62.81);Other symptoms and signs involving cognitive function;Unsteadiness on feet (R26.81)   Activity Tolerance Patient tolerated treatment well   Patient Left in chair;with call bell/phone within reach;with chair alarm set   Nurse Communication Mobility status        Time: 0768-0881 OT Time Calculation (min): 27 min  Charges: OT General Charges $OT Visit: 1 Visit OT Treatments $Self  Care/Home Management : 8-22 mins  Lou Cal, [place] Pager [phone] Office [phone]    Natalie Friedman 07/15/2018, 2:08 PM

## 2018-07-15 NOTE — Progress Notes (Signed)
Physical Therapy Treatment Patient Details Name: Natalie Friedman MRN: [id number] DOB: [birth year] Today's Date: 07/15/2018    History of Present Illness 68 y.o. 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 Admitted 2/24 for  pseudomeningocele with CSF leakage, 2/25 lumbar subarachnoid drain placed remained in hospital until 3/7 d/c'd home. 3/17 returned to hospital due to worsening mental status secondary to shunt malfunction. 3/18 shunt revision performed and d/c'd home. After additional decline in mental status admitted  4/1 returned to hospital for revision of left ventriculoperitoneal shunt.     PT Comments    Per OT pt with regression cognitively compared to previous session. Unsure if patient was given medication for her aggressive episodes over the night. Pt currently requiring maxAx2 for all mobility and is dependent for ADLs. Pt with poor attn span, lethargy, impaired balance, and significant weakness. Pt unsafe to return home as spouse is unable to provide x2 maximal assist 24/7. Aware spouse is refusing CIR, pt will need 24/7 +2 physical assist for safe d/c home. Acute to cont to follow.   Follow Up Recommendations  Home health PT;Supervision/Assistance - 24 hour     Equipment Recommendations  None recommended by PT    Recommendations for Other Services Rehab consult     Precautions / Restrictions Precautions Precautions: Fall Restrictions Weight Bearing Restrictions: No    Mobility  Bed Mobility Overal bed mobility: Needs Assistance Bed Mobility: Supine to Sit     Supine to sit: Max assist;+2 for physical assistance;Mod assist     General bed mobility comments: pt with no initiation of task, requires max verbal and tactile cues to bring LEs to EOB and then maxA for trunk elevation  Transfers Overall transfer level: Needs assistance Equipment used: Rolling walker (2 wheeled)(2 person lift  with gait belt and bed pad) Transfers: Sit to/from Stand;Stand Pivot Transfers Sit to Stand: Max assist;+2 physical assistance Stand pivot transfers: Max assist;+2 physical assistance       General transfer comment: pt initiating leaning forward but unable to power up and clear bottom. pt stood up 1 time to RW however pt then immeadiately sat down impulsively. Pt then complete sit to stand with 2 person lift with gait belt and bed pad underneath her. posterior pressure on hips allowed pt to maintain/tolerate standing position long enough for std pvt, pt required maxA to moved R LE over towards chair to complete std pvt to chair  Ambulation/Gait             General Gait Details: unable this date   Stairs             Wheelchair Mobility    Modified Rankin (Stroke Patients Only)       Balance Overall balance assessment: Needs assistance Sitting-balance support: Bilateral upper extremity supported;Feet supported Sitting balance-Leahy Scale: Fair Sitting balance - Comments: progressed from Zero balance to fair with sitting 5 minutes EoB   Standing balance support: Bilateral upper extremity supported Standing balance-Leahy Scale: Poor Standing balance comment: dependent on physical assist                            Cognition Arousal/Alertness: Awake/alert Behavior During Therapy: Flat affect Overall Cognitive Status: Impaired/Different from baseline Area of Impairment: Orientation;Attention;Memory;Following commands;Safety/judgement;Awareness;Problem solving                 Orientation Level: Disoriented to;Place;Time;Situation Current Attention Level:  Focused Memory: Decreased short-term memory Following Commands: Follows one step commands with increased time;Follows one step commands inconsistently Safety/Judgement: Decreased awareness of safety;Decreased awareness of deficits Awareness: Intellectual Problem Solving: Slow processing;Decreased  initiation;Difficulty sequencing;Requires verbal cues;Requires tactile cues General Comments: pt perseverating on words, pt confused, unable to recall she is in the hospital depiste max verbal cues to re-orient patient, pt unable to maintain attention to task greater than 10 sec      Exercises      General Comments General comments (skin integrity, edema, etc.): VSS      Pertinent Vitals/Pain Pain Assessment: Faces Faces Pain Scale: Hurts a little bit Pain Location: pt rubbing behind L ear Pain Descriptors / Indicators: Grimacing Pain Intervention(s): Limited activity within patient's tolerance    Home Living                      Prior Function            PT Goals (current goals can now be found in the care plan section) Acute Rehab PT Goals Patient Stated Goal: none stated Progress towards PT goals: Not progressing toward goals - comment    Frequency    Min 4X/week      PT Plan Discharge plan needs to be updated    Co-evaluation PT/OT/SLP Co-Evaluation/Treatment: Yes Reason for Co-Treatment: Complexity of the patient's impairments (multi-system involvement);Necessary to address cognition/behavior during functional activity PT goals addressed during session: Mobility/safety with mobility        AM-PAC PT "6 Clicks" Mobility   Outcome Measure  Help needed turning from your back to your side while in a flat bed without using bedrails?: Total Help needed moving from lying on your back to sitting on the side of a flat bed without using bedrails?: Total Help needed moving to and from a bed to a chair (including a wheelchair)?: Total Help needed standing up from a chair using your arms (e.g., wheelchair or bedside chair)?: Total Help needed to walk in hospital room?: Total Help needed climbing 3-5 steps with a railing? : Total 6 Click Score: 6    End of Session Equipment Utilized During Treatment: Gait belt Activity Tolerance: Patient limited by  fatigue Patient left: in chair;with call bell/phone within reach;with chair alarm set Nurse Communication: Mobility status;Need for lift equipment;Other (comment) PT Visit Diagnosis: Unsteadiness on feet (R26.81);Other abnormalities of gait and mobility (R26.89);Muscle weakness (generalized) (M62.81);Difficulty in walking, not elsewhere classified (R26.2);Dizziness and giddiness (R42);Other symptoms and signs involving the nervous system (R29.898);Pain     Time: 1133-1200 PT Time Calculation (min) (ACUTE ONLY): 27 min  Charges:  $Therapeutic Activity: 8-22 mins                     Kittie Plater, PT, DPT Acute Rehabilitation Services Pager #: [phone] Office #: [phone]    [name] 07/15/2018, 12:37 PM

## 2018-07-15 NOTE — Progress Notes (Signed)
Patient with BP 161/111, HR 75 given Labetalol 10 mg IV as order with results of 150/80.  Pateint able to eat applesauce with antibiotic well. O2 off patient now and states pain os ok.

## 2018-07-15 NOTE — Progress Notes (Addendum)
Patient with decrease urine output. After bladder scan with a reading of 999 cc 14FR  Foley cath inserted with yellow sediment return.  Call to Dr on call for further orders.  Pain med given to patient. @ 5:34 call to [phone].

## 2018-07-15 NOTE — Progress Notes (Addendum)
Neurosurgery Service Progress Note  Subjective: No acute events overnight  Objective: Vitals:   07/15/18 0100 07/15/18 0200 07/15/18 0421 07/15/18 0818  BP: (!) 185/102 (!) 157/95 (!) 86/60 (!) 154/101  Pulse: 84 80 91 (!) 101  Resp:   18 19  Temp:   (!) 97.5 F (36.4 C)   TempSrc:   Oral   SpO2: 95% 97%  99%  Weight:      Height:       Temp (24hrs), Avg:97.9 F (36.6 C), Min:97.5 F (36.4 C), Max:98.3 F (36.8 C)  CBC Latest Ref Rng & Units 07/10/2018 06/26/2018 06/07/2018  WBC 4.0 - 10.5 K/uL 14.0(H) 8.0 6.2  Hemoglobin 12.0 - 15.0 g/dL 13.3 12.5 11.6(L)  Hematocrit 36.0 - 46.0 % 40.2 39.7 35.6(L)  Platelets 150 - 400 K/uL 161 188 124(L)   BMP Latest Ref Rng & Units 07/10/2018 06/26/2018 06/07/2018  Glucose 70 - 99 mg/dL - 96 96  BUN 8 - 23 mg/dL - 35(H) 51(H)  Creatinine 0.44 - 1.00 mg/dL 2.80(H) 2.56(H) 2.75(H)  Sodium 135 - 145 mmol/L - 139 137  Potassium 3.5 - 5.1 mmol/L - 4.2 5.2(H)  Chloride 98 - 111 mmol/L - 108 104  CO2 22 - 32 mmol/L - 22 23  Calcium 8.9 - 10.3 mg/dL - 10.2 9.8    Intake/Output Summary (Last 24 hours) at 07/15/2018 7867 Last data filed at 07/15/2018 0600 Gross per 24 hour  Intake 1200 ml  Output 2650 ml  Net -1450 ml    Current Facility-Administered Medications:  .  acetaminophen (TYLENOL) tablet 650 mg, 650 mg, Oral, Q4H PRN **OR** acetaminophen (TYLENOL) suppository 650 mg, 650 mg, Rectal, Q4H PRN, Delmus Warwick A, MD .  amLODipine (NORVASC) tablet 5 mg, 5 mg, Oral, QPM, Sully Manzi A, MD, 5 mg at 07/14/18 1801 .  ampicillin (PRINCIPEN) capsule 500 mg, 500 mg, Oral, Q6H, Bergman, Meghan D, NP, 500 mg at 07/15/18 0109 .  carvedilol (COREG) tablet 12.5 mg, 12.5 mg, Oral, Daily, Judith Part, MD, 12.5 mg at 07/14/18 0749 .  Chlorhexidine Gluconate Cloth 2 % PADS 6 each, 6 each, Topical, Daily, Judith Part, MD, 6 each at 07/13/18 0932 .  docusate sodium (COLACE) capsule 100 mg, 100 mg, Oral, BID, Colinda Barth A, MD, 100  mg at 07/13/18 0932 .  heparin injection 5,000 Units, 5,000 Units, Subcutaneous, Q8H, Libbi Towner, Joyice Faster, MD, 5,000 Units at 07/14/18 1348 .  HYDROcodone-acetaminophen (NORCO/VICODIN) 5-325 MG per tablet 1 tablet, 1 tablet, Oral, Q4H PRN, Judith Part, MD, 1 tablet at 07/14/18 0039 .  HYDROmorphone (DILAUDID) injection 0.5 mg, 0.5 mg, Intravenous, Q3H PRN, Judith Part, MD, 0.5 mg at 07/15/18 0508 .  insulin detemir (LEVEMIR) injection 10 Units, 10 Units, Subcutaneous, QPM, Judith Part, MD, 10 Units at 07/14/18 1802 .  labetalol (NORMODYNE,TRANDATE) injection 10-40 mg, 10-40 mg, Intravenous, Q10 min PRN, Judith Part, MD, 20 mg at 07/15/18 0116 .  mirtazapine (REMERON) tablet 7.5 mg, 7.5 mg, Oral, QHS, Shishir Krantz A, MD, 7.5 mg at 07/12/18 2256 .  naphazoline-glycerin (CLEAR EYES REDNESS) ophth solution 1 drop, 1 drop, Right Eye, QID, Tenille Morrill, Joyice Faster, MD, 1 drop at 07/14/18 1802 .  ondansetron (ZOFRAN) tablet 4 mg, 4 mg, Oral, Q4H PRN **OR** ondansetron (ZOFRAN) injection 4 mg, 4 mg, Intravenous, Q4H PRN, [place] Doster A, MD .  polyethylene glycol (MIRALAX / GLYCOLAX) packet 17 g, 17 g, Oral, Daily PRN, Judith Part, MD .  promethazine (PHENERGAN) tablet 12.5-25  mg, 12.5-25 mg, Oral, Q4H PRN, Aladdin Kollmann A, MD .  sodium chloride flush (NS) 0.9 % injection 10-40 mL, 10-40 mL, Intracatheter, Q12H, [place]alaya Zappia, Joyice Faster, MD, 10 mL at 07/14/18 2150 .  sodium chloride flush (NS) 0.9 % injection 10-40 mL, 10-40 mL, Intracatheter, PRN, Judith Part, MD   Physical Exam: Awake/alert and interactive, Ox1, PERRL, EOMI, +R HB5, Strength 5/5 x4 Shunt incisions c/d/i R retrosig incision - pseudomeningocele soft, no leakage of CSF  Assessment & Plan: 68 y.o. 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  -pseudomeningocele flat -pt d/c'd PICC line, likely to be a recurring issue, transitioned to oral ampicillin,  ABx stop date 4/15 -discussed w/ pt & husband that there is still a significant chance that the shunt may require externalization for shunt holiday then reimplantation, but we will try to avoid this if necessary -urinary retention w/ turbid urine, Cx pending, UA w/ some budding yeasts and bacteria, nitrite negative - pt not high risk for invasive fungal infection - will Tx bacteria if Cx grows something clinically significant -RFP today given new ABx and baseline CKD with urinary retention -still more lethargic than I would expect, will repeat CTH -SCDs/TEDs, SQH  Judith Part  07/15/18 8:33 AM

## 2018-07-15 NOTE — Care Management Important Message (Signed)
Important Message  Patient Details  Name: Natalie Friedman MRN: [id number] Date of Birth: January 22, 1951   Medicare Important Message Given:  Yes    Cristiano Capri Montine Circle 07/15/2018, 4:15 PM

## 2018-07-16 ENCOUNTER — Inpatient Hospital Stay: Payer: Self-pay

## 2018-07-16 LAB — URINE CULTURE: Culture: >=100000 — AB

## 2018-07-16 LAB — GLUCOSE, CAPILLARY
Glucose-Capillary: 76 mg/dL (ref 70–99)
Glucose-Capillary: 174 mg/dL — ABNORMAL HIGH (ref 70–99)
Glucose-Capillary: 103 mg/dL — ABNORMAL HIGH (ref 70–99)
Glucose-Capillary: 113 mg/dL — ABNORMAL HIGH (ref 70–99)

## 2018-07-16 MED ORDER — SODIUM CHLORIDE 0.9 % IV SOLN
2.0000 g | Freq: Three times a day (TID) | INTRAVENOUS | Status: AC
  Administered 2018-07-16 – 2018-07-21 (×18): 2 g via INTRAVENOUS
  Filled 2018-07-16 (×18): qty 2000

## 2018-07-16 NOTE — Progress Notes (Signed)
Occupational Therapy Treatment Patient Details Name: Natalie Friedman MRN: [id number] DOB: 1951/03/18 Today's Date: 07/16/2018    History of present illness 68 y.o. 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 Admitted 2/24 for  pseudomeningocele with CSF leakage, 2/25 lumbar subarachnoid drain placed remained in hospital until 3/7 d/c'd home. 3/17 returned to hospital due to worsening mental status secondary to shunt malfunction. 3/18 shunt revision performed and d/c'd home. After additional decline in mental status admitted  4/1 returned to hospital for revision of left ventriculoperitoneal shunt.    OT comments  Pt presents seated in recliner agreeable to therapy session. Pt performing seated grooming ADL during session - requires overall minA to perform task though requires significant cueing to initiate and to properly sequence through tasks. Pt also requires cues for proper use of ADL items for completing oral care. Pt more awake this session but continues to have delayed responses to questions and remains disoriented to situation and time. Feel she remains appropriate for post acute rehab services at time of discharge. Will continue per POC.   Follow Up Recommendations  CIR;Supervision/Assistance - 24 hour(if pt refuses and returns home, rec 24hr and HHOT)    Equipment Recommendations  Wheelchair (measurements OT);Wheelchair cushion (measurements OT);3 in 1 bedside commode          Precautions / Restrictions Precautions Precautions: Fall Restrictions Weight Bearing Restrictions: No       Mobility Bed Mobility Overal bed mobility: Needs Assistance Bed Mobility: Supine to Sit     Supine to sit: Mod assist;HOB elevated     General bed mobility comments: received up in recliner  Transfers Overall transfer level: Needs assistance Equipment used: Rolling walker (2 wheeled) Transfers: Sit to/from  [facility] Sit to Stand: Max assist;+2 physical assistance         General transfer comment: deferred as pt requiring two person assist for safe transfer completion    Balance Overall balance assessment: Needs assistance Sitting-balance support: Bilateral upper extremity supported;Feet supported Sitting balance-Leahy Scale: Fair     Standing balance support: Bilateral upper extremity supported Standing balance-Leahy Scale: Poor Standing balance comment: dependent on physical assist                           ADL either performed or assessed with clinical judgement   ADL Overall ADL's : Needs assistance/impaired Eating/Feeding: Minimal assistance;Set up;Sitting   Grooming: Minimal assistance;Sitting;Wash/dry face;Oral care Grooming Details (indicate cue type and reason): pt requires step-by-step cues to perform oral care, initially attempting to brush teeth using toothbrush without toothpaste, once handed toothpaste and had pt open pt attemting to just squeeze toothpaste into her mouth - requires increased cues to rinse and spit out secretions at end of task                                      Vision       Perception     Praxis      Cognition Arousal/Alertness: Awake/alert Behavior During Therapy: Flat affect Overall Cognitive Status: Impaired/Different from baseline Area of Impairment: Orientation;Attention;Memory;Following commands;Safety/judgement;Awareness;Problem solving                 Orientation Level: Disoriented to;Time;Situation Current Attention Level: Focused Memory: Decreased short-term memory Following Commands: Follows one step commands with increased time;Follows  one step commands inconsistently Safety/Judgement: Decreased awareness of safety;Decreased awareness of deficits Awareness: Intellectual Problem Solving: Slow processing;Decreased initiation;Difficulty sequencing;Requires verbal cues;Requires  tactile cues General Comments: pt continues to demonstrate delayed reponses to questions and requires increased cues to initiate and sequenct through basic tasks        Exercises     Shoulder Instructions       General Comments VSS    Pertinent Vitals/ Pain       Pain Assessment: No/denies pain Faces Pain Scale: Hurts a little bit Pain Location: grimacing t/o movement but didn't state where Pain Descriptors / Indicators: Grimacing Pain Intervention(s): Monitored during session  Home Living                                          Prior Functioning/Environment              Frequency  Min 2X/week        Progress Toward Goals  OT Goals(current goals can now be found in the care plan section)  Progress towards OT goals: Progressing toward goals(slowly)  Acute Rehab OT Goals Patient Stated Goal: none stated OT Goal Formulation: With patient Time For Goal Achievement: 07/25/18 Potential to Achieve Goals: Good  Plan Discharge plan remains appropriate    Co-evaluation                 AM-PAC OT "6 Clicks" Daily Activity     Outcome Measure   Help from another person eating meals?: A Little Help from another person taking care of personal grooming?: A Little Help from another person toileting, which includes using toliet, bedpan, or urinal?: A Lot Help from another person bathing (including washing, rinsing, drying)?: A Lot Help from another person to put on and taking off regular upper body clothing?: A Lot Help from another person to put on and taking off regular lower body clothing?: A Lot 6 Click Score: 14    End of Session    OT Visit Diagnosis: Muscle weakness (generalized) (M62.81);Other symptoms and signs involving cognitive function;Unsteadiness on feet (R26.81)   Activity Tolerance Patient tolerated treatment well   Patient Left in chair;with call bell/phone within reach;with chair alarm set;with nursing/sitter in room    Nurse Communication Mobility status        Time: 8768-1157 OT Time Calculation (min): 16 min  Charges: OT General Charges $OT Visit: 1 Visit OT Treatments $Self Care/Home Management : 8-22 mins  Lou Cal, [place] Pager [phone] Office [phone]    Raymondo Band 07/16/2018, 1:54 PM

## 2018-07-16 NOTE — Progress Notes (Signed)
Physical Therapy Treatment Patient Details Name: Natalie Friedman MRN: [id number] DOB: Aug 21, 1950 Today's Date: 07/16/2018    History of Present Illness 68 y.o. 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 Admitted 2/24 for  pseudomeningocele with CSF leakage, 2/25 lumbar subarachnoid drain placed remained in hospital until 3/7 d/c'd home. 3/17 returned to hospital due to worsening mental status secondary to shunt malfunction. 3/18 shunt revision performed and d/c'd home. After additional decline in mental status admitted  4/1 returned to hospital for revision of left ventriculoperitoneal shunt.     PT Comments    Pt more talkative today with slightly improved command follow however continues to have decreased insight to deficits and safety. Pt cont to impulsively sit during standing despite maximal tactile and verbal cues. Pt required use of stedy for safe transfer from bed to chair today. Focused on sit to stand in stedy today. Acute PT to cont to follow. Cont to recommend that patient not go home with spouse due to patient requiring maximal assist for all mobility and adls.    Follow Up Recommendations  Home health PT;Supervision/Assistance - 24 hour     Equipment Recommendations  None recommended by PT    Recommendations for Other Services       Precautions / Restrictions Precautions Precautions: Fall Restrictions Weight Bearing Restrictions: No    Mobility  Bed Mobility Overal bed mobility: Needs Assistance Bed Mobility: Supine to Sit     Supine to sit: Mod assist;HOB elevated     General bed mobility comments: once PT brought bilat LEs off EOB pt did initiate pushing herself up into sitting from sidelying with minA for safety  Transfers Overall transfer level: Needs assistance Equipment used: Rolling walker (2 wheeled) Transfers: Sit to/from [facility] Sit to Stand: Max  assist;+2 physical assistance         General transfer comment: attempted to stand x2 with RW however pt immediately sat back down ont he bed despite maximal verbal and tactile cues to maintain standing. for safety and due to patients decresaed insight to safety and decreased command follow we transitioned to the stedy. Pt completed 5 sit to stand from stedy elevated surface. attempted to prolong standing endurance however pt repeatedly would impulsively sit down. used steady to transfer to chair  Ambulation/Gait             General Gait Details: unable this date   Stairs             Wheelchair Mobility    Modified Rankin (Stroke Patients Only)       Balance Overall balance assessment: Needs assistance Sitting-balance support: Bilateral upper extremity supported;Feet supported Sitting balance-Leahy Scale: Fair     Standing balance support: Bilateral upper extremity supported Standing balance-Leahy Scale: Poor Standing balance comment: dependent on physical assist                            Cognition Arousal/Alertness: Awake/alert Behavior During Therapy: Flat affect Overall Cognitive Status: Impaired/Different from baseline Area of Impairment: Orientation;Attention;Memory;Following commands;Safety/judgement;Awareness;Problem solving                 Orientation Level: Disoriented to;Place;Time;Situation Current Attention Level: Focused Memory: Decreased short-term memory Following Commands: Follows one step commands with increased time;Follows one step commands inconsistently Safety/Judgement: Decreased awareness of safety;Decreased awareness of deficits Awareness: Intellectual Problem Solving: Slow processing;Decreased initiation;Difficulty sequencing;Requires verbal cues;Requires  tactile cues General Comments: pt more talkative today and followed simple commands more consistantly. Pt continues to get distracted easily, has poor attn span and  decreased insight to deficits and safety      Exercises      General Comments General comments (skin integrity, edema, etc.): VSS      Pertinent Vitals/Pain Pain Assessment: Faces Faces Pain Scale: Hurts a little bit Pain Location: grimacing t/o movement but didn't state where Pain Descriptors / Indicators: Grimacing Pain Intervention(s): Monitored during session    Home Living                      Prior Function            PT Goals (current goals can now be found in the care plan section) Progress towards PT goals: Progressing toward goals    Frequency    Min 4X/week      PT Plan Current plan remains appropriate    Co-evaluation              AM-PAC PT "6 Clicks" Mobility   Outcome Measure  Help needed turning from your back to your side while in a flat bed without using bedrails?: A Lot Help needed moving from lying on your back to sitting on the side of a flat bed without using bedrails?: A Lot Help needed moving to and from a bed to a chair (including a wheelchair)?: A Lot Help needed standing up from a chair using your arms (e.g., wheelchair or bedside chair)?: A Lot Help needed to walk in hospital room?: Total Help needed climbing 3-5 steps with a railing? : Total 6 Click Score: 10    End of Session Equipment Utilized During Treatment: Gait belt Activity Tolerance: Patient tolerated treatment well Patient left: in chair;with call bell/phone within reach;with chair alarm set Nurse Communication: Mobility status;Need for lift equipment(use steady) PT Visit Diagnosis: Unsteadiness on feet (R26.81);Other abnormalities of gait and mobility (R26.89);Muscle weakness (generalized) (M62.81);Difficulty in walking, not elsewhere classified (R26.2);Dizziness and giddiness (R42);Other symptoms and signs involving the nervous system (R29.898);Pain     Time: 5697-9480 PT Time Calculation (min) (ACUTE ONLY): 24 min  Charges:  $Gait Training: 8-22  mins $Therapeutic Activity: 8-22 mins                     Kittie Plater, PT, DPT Acute Rehabilitation Services Pager #: [phone] Office #: [phone]    [name] 07/16/2018, 12:08 PM

## 2018-07-16 NOTE — Progress Notes (Signed)
Present to place PICC.  Pt. Uncooperative.  Will not hold arm out  pulling it away from nurse.  Will not roll on back.  Keeps saying no no no.  Therefore unable to place PICC at this time.  Will revisit tomorrow.

## 2018-07-16 NOTE — Progress Notes (Signed)
Neurosurgery Service Progress Note  Subjective: No acute events overnight  Objective: Vitals:   07/15/18 2000 07/15/18 2338 07/16/18 0421 07/16/18 0802  BP:  117/63  (!) 173/97  Pulse: 94  92 83  Resp: 20   20  Temp: 98.5 F (36.9 C) 97.6 F (36.4 C) 98.1 F (36.7 C) 98 F (36.7 C)  TempSrc: Axillary Axillary Oral   SpO2: 98% 98% 92% 97%  Weight:      Height:       Temp (24hrs), Avg:98.3 F (36.8 C), Min:97.6 F (36.4 C), Max:98.8 F (37.1 C)  CBC Latest Ref Rng & Units 07/15/2018 07/10/2018 06/26/2018  WBC 4.0 - 10.5 K/uL 6.3 14.0(H) 8.0  Hemoglobin 12.0 - 15.0 g/dL 11.8(L) 13.3 12.5  Hematocrit 36.0 - 46.0 % 37.8 40.2 39.7  Platelets 150 - 400 K/uL 186 161 188   BMP Latest Ref Rng & Units 07/15/2018 07/10/2018 06/26/2018  Glucose 70 - 99 mg/dL 117(H) - 96  BUN 8 - 23 mg/dL 27(H) - 35(H)  Creatinine 0.44 - 1.00 mg/dL 2.13(H) 2.80(H) 2.56(H)  Sodium 135 - 145 mmol/L 145 - 139  Potassium 3.5 - 5.1 mmol/L 3.3(L) - 4.2  Chloride 98 - 111 mmol/L 108 - 108  CO2 22 - 32 mmol/L 29 - 22  Calcium 8.9 - 10.3 mg/dL 8.6(L) - 10.2    Intake/Output Summary (Last 24 hours) at 07/16/2018 6967 Last data filed at 07/15/2018 2214 Gross per 24 hour  Intake 250 ml  Output 550 ml  Net -300 ml    Current Facility-Administered Medications:  .  acetaminophen (TYLENOL) tablet 650 mg, 650 mg, Oral, Q4H PRN **OR** acetaminophen (TYLENOL) suppository 650 mg, 650 mg, Rectal, Q4H PRN, Ostergard, Thomas A, MD .  amLODipine (NORVASC) tablet 5 mg, 5 mg, Oral, QPM, Ostergard, Thomas A, MD, 5 mg at 07/15/18 1731 .  ampicillin (PRINCIPEN) capsule 500 mg, 500 mg, Oral, Q6H, Bergman, Meghan D, NP, 500 mg at 07/16/18 0544 .  carvedilol (COREG) tablet 12.5 mg, 12.5 mg, Oral, Daily, Judith Part, MD, 12.5 mg at 07/15/18 0921 .  Chlorhexidine Gluconate Cloth 2 % PADS 6 each, 6 each, Topical, Daily, Judith Part, MD, 6 each at 07/13/18 0932 .  docusate sodium (COLACE) capsule 100 mg, 100 mg, Oral, BID,  Ostergard, Thomas A, MD, 100 mg at 07/15/18 2211 .  heparin injection 5,000 Units, 5,000 Units, Subcutaneous, Q8H, Judith Part, MD, 5,000 Units at 07/16/18 0544 .  HYDROcodone-acetaminophen (NORCO/VICODIN) 5-325 MG per tablet 1 tablet, 1 tablet, Oral, Q4H PRN, Judith Part, MD, 1 tablet at 07/15/18 [phone] .  HYDROmorphone (DILAUDID) injection 0.5 mg, 0.5 mg, Intravenous, Q3H PRN, Judith Part, MD, 0.5 mg at 07/15/18 0508 .  insulin detemir (LEVEMIR) injection 10 Units, 10 Units, Subcutaneous, QPM, Ostergard, Thomas A, MD, 10 Units at 07/15/18 1732 .  labetalol (NORMODYNE,TRANDATE) injection 10-40 mg, 10-40 mg, Intravenous, Q10 min PRN, Judith Part, MD, 20 mg at 07/15/18 0116 .  mirtazapine (REMERON) tablet 7.5 mg, 7.5 mg, Oral, QHS, Ostergard, Thomas A, MD, 7.5 mg at 07/15/18 2212 .  naphazoline-glycerin (CLEAR EYES REDNESS) ophth solution 1 drop, 1 drop, Right Eye, QID, Judith Part, MD, 1 drop at 07/15/18 2213 .  ondansetron (ZOFRAN) tablet 4 mg, 4 mg, Oral, Q4H PRN **OR** ondansetron (ZOFRAN) injection 4 mg, 4 mg, Intravenous, Q4H PRN, Ostergard, Thomas A, MD .  polyethylene glycol (MIRALAX / GLYCOLAX) packet 17 g, 17 g, Oral, Daily PRN, Judith Part, MD .  promethazine (PHENERGAN) tablet 12.5-25 mg, 12.5-25 mg, Oral, Q4H PRN, Ostergard, Thomas A, MD .  sodium chloride flush (NS) 0.9 % injection 10-40 mL, 10-40 mL, Intracatheter, Q12H, Ostergard, Joyice Faster, MD, 10 mL at 07/15/18 2214 .  sodium chloride flush (NS) 0.9 % injection 10-40 mL, 10-40 mL, Intracatheter, PRN, Judith Part, MD, 10 mL at 07/15/18 1219   Physical Exam: Awake/alert and interactive, Ox1, PERRL, EOMI, +R HB5, Strength 5/5 x4 Shunt incisions c/d/i R retrosig incision - pseudomeningocele soft, slightly increased in size, no leakage of CSF  Assessment & Plan: 68 y.o. 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  -pseudomeningocele soft, no  leakage -discussed w/ pharmacy, unlikely to have good CNS drug levels w/ po regimen, will replace PICC and transition back to IV Tx  -UCx still NGTD -will keep inpatient to see which direction she is heading with regard to mental status and pseudomeningocele. If it recurs or she does not improve, will externalize shunt. -SCDs/TEDs, SQH  Thomas A Ostergard  07/16/18 8:11 AM

## 2018-07-17 ENCOUNTER — Inpatient Hospital Stay (HOSPITAL_COMMUNITY): Payer: Medicare Other

## 2018-07-17 LAB — GLUCOSE, CAPILLARY
Glucose-Capillary: 73 mg/dL (ref 70–99)
Glucose-Capillary: 109 mg/dL — ABNORMAL HIGH (ref 70–99)
Glucose-Capillary: 96 mg/dL (ref 70–99)
Glucose-Capillary: 98 mg/dL (ref 70–99)

## 2018-07-17 MED ORDER — SODIUM CHLORIDE 0.9% FLUSH
10.0000 mL | Freq: Two times a day (BID) | INTRAVENOUS | Status: DC
  Administered 2018-07-17 – 2018-07-21 (×7): 10 mL

## 2018-07-17 MED ORDER — LORAZEPAM 2 MG/ML IJ SOLN
0.5000 mg | Freq: Once | INTRAMUSCULAR | Status: AC | PRN
  Administered 2018-07-17: 0.5 mg via INTRAVENOUS
  Filled 2018-07-17: qty 1

## 2018-07-17 MED ORDER — SODIUM CHLORIDE 0.9 % IV BOLUS
500.0000 mL | Freq: Once | INTRAVENOUS | Status: AC
  Administered 2018-07-17: 500 mL via INTRAVENOUS

## 2018-07-17 MED ORDER — SODIUM CHLORIDE 0.9% FLUSH: 10.0000 mL | INTRAVENOUS | Status: DC | PRN

## 2018-07-17 NOTE — Progress Notes (Signed)
Physical Therapy Treatment Patient Details Name: Natalie Friedman MRN: [id number] DOB: 01-19-1951 Today's Date: 07/17/2018    History of Present Illness 68 y.o. 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 Admitted 2/24 for  pseudomeningocele with CSF leakage, 2/25 lumbar subarachnoid drain placed remained in hospital until 3/7 d/c'd home. 3/17 returned to hospital due to worsening mental status secondary to shunt malfunction. 3/18 shunt revision performed and d/c'd home. After additional decline in mental status admitted  4/1 returned to hospital for revision of left ventriculoperitoneal shunt.     PT Comments    Pt was lethargic from sleep and showed low focus on task.  Notably fearful of falling.  Emphasis on sit to stand,  transfers and getting to the EOB/  Follow Up Recommendations  Home health PT;Supervision/Assistance - 24 hour     Equipment Recommendations  None recommended by PT    Recommendations for Other Services       Precautions / Restrictions Precautions Precautions: Fall    Mobility  Bed Mobility Overal bed mobility: Needs Assistance Bed Mobility: Supine to Sit     Supine to sit: Mod assist;HOB elevated     General bed mobility comments: cues for direction, tactile cues for initiation, truncal assist up to sitting  Transfers Overall transfer level: Needs assistance Equipment used: Rolling walker (2 wheeled) Transfers: Sit to/from [employer] Sit to Stand: Max assist;+2 physical assistance(x3)   Squat pivot transfers: Max assist;+2 safety/equipment     General transfer comment: cues for hand placement, assist to come forward and boost.  Ambulation/Gait             General Gait Details: unable this date   Stairs             Wheelchair Mobility    Modified Rankin (Stroke Patients Only)       Balance Overall balance assessment: Needs  assistance   Sitting balance-Leahy Scale: Fair Sitting balance - Comments: preferred UE assist while still sleepy, then steady withou UE's   Standing balance support: Bilateral upper extremity supported Standing balance-Leahy Scale: Poor                              Cognition Arousal/Alertness: Awake/alert Behavior During Therapy: Flat affect Overall Cognitive Status: Impaired/Different from baseline Area of Impairment: Orientation;Attention;Memory;Following commands;Safety/judgement;Awareness;Problem solving                 Orientation Level: Situation Current Attention Level: Focused Memory: Decreased short-term memory Following Commands: Follows one step commands with increased time;Follows one step commands inconsistently(but was sleepy today) Safety/Judgement: Decreased awareness of safety;Decreased awareness of deficits Awareness: Intellectual Problem Solving: Slow processing;Decreased initiation;Difficulty sequencing;Requires verbal cues;Requires tactile cues        Exercises Other Exercises Other Exercises: warm up ROM to improve stiffness and help pt wake up.    General Comments General comments (skin integrity, edema, etc.): vss on RA      Pertinent Vitals/Pain Pain Assessment: Faces Faces Pain Scale: Hurts a little bit Pain Location: vague reports while moving Pain Descriptors / Indicators: Grimacing Pain Intervention(s): Monitored during session    Home Living                      Prior Function            PT Goals (current goals can now be found in  the care plan section) Acute Rehab PT Goals Patient Stated Goal: none stated PT Goal Formulation: Patient unable to participate in goal setting Time For Goal Achievement: 07/25/18 Potential to Achieve Goals: Fair Progress towards PT goals: Progressing toward goals    Frequency    Min 4X/week      PT Plan Current plan remains appropriate    Co-evaluation               AM-PAC PT "6 Clicks" Mobility   Outcome Measure  Help needed turning from your back to your side while in a flat bed without using bedrails?: A Lot Help needed moving from lying on your back to sitting on the side of a flat bed without using bedrails?: A Lot Help needed moving to and from a bed to a chair (including a wheelchair)?: A Lot Help needed standing up from a chair using your arms (e.g., wheelchair or bedside chair)?: Total Help needed to walk in hospital room?: A Lot Help needed climbing 3-5 steps with a railing? : Total 6 Click Score: 10    End of Session   Activity Tolerance: Patient tolerated treatment well Patient left: in chair;with call bell/phone within reach;with chair alarm set Nurse Communication: Mobility status;Need for lift equipment PT Visit Diagnosis: Unsteadiness on feet (R26.81);Other abnormalities of gait and mobility (R26.89);Muscle weakness (generalized) (M62.81)     Time: 5427-0623 PT Time Calculation (min) (ACUTE ONLY): 23 min  Charges:  $Therapeutic Activity: 23-37 mins                     07/17/2018  Donnella Sham, PT Acute Rehabilitation Services [phone]  (pager) [phone]  (office)   [name] 07/17/2018, 3:31 PM

## 2018-07-17 NOTE — Progress Notes (Signed)
Neurosurgery Service Progress Note  Subjective: No acute events overnight  Objective: Vitals:   07/16/18 1700 07/16/18 1942 07/17/18 0353 07/17/18 0824  BP: (!) 159/78 (!) 151/94 (!) 168/93 (!) 82/59  Pulse: 88 86 76 90  Resp: 20 (!) 21  16  Temp: 98.5 F (36.9 C) 98.2 F (36.8 C) 97.7 F (36.5 C) 98.3 F (36.8 C)  TempSrc: Oral Oral Oral Axillary  SpO2: 100% 100%  92%  Weight:      Height:       Temp (24hrs), Avg:98.3 F (36.8 C), Min:97.7 F (36.5 C), Max:98.6 F (37 C)  CBC Latest Ref Rng & Units 07/15/2018 07/10/2018 06/26/2018  WBC 4.0 - 10.5 K/uL 6.3 14.0(H) 8.0  Hemoglobin 12.0 - 15.0 g/dL 11.8(L) 13.3 12.5  Hematocrit 36.0 - 46.0 % 37.8 40.2 39.7  Platelets 150 - 400 K/uL 186 161 188   BMP Latest Ref Rng & Units 07/15/2018 07/10/2018 06/26/2018  Glucose 70 - 99 mg/dL 117(H) - 96  BUN 8 - 23 mg/dL 27(H) - 35(H)  Creatinine 0.44 - 1.00 mg/dL 2.13(H) 2.80(H) 2.56(H)  Sodium 135 - 145 mmol/L 145 - 139  Potassium 3.5 - 5.1 mmol/L 3.3(L) - 4.2  Chloride 98 - 111 mmol/L 108 - 108  CO2 22 - 32 mmol/L 29 - 22  Calcium 8.9 - 10.3 mg/dL 8.6(L) - 10.2    Intake/Output Summary (Last 24 hours) at 07/17/2018 0840 Last data filed at 07/17/2018 0500 Gross per 24 hour  Intake 450 ml  Output 650 ml  Net -200 ml    Current Facility-Administered Medications:  .  acetaminophen (TYLENOL) tablet 650 mg, 650 mg, Oral, Q4H PRN **OR** acetaminophen (TYLENOL) suppository 650 mg, 650 mg, Rectal, Q4H PRN, Kailin Principato A, MD .  amLODipine (NORVASC) tablet 5 mg, 5 mg, Oral, QPM, Saquoia Sianez A, MD, 5 mg at 07/15/18 1731 .  ampicillin (OMNIPEN) 2 g in sodium chloride 0.9 % 100 mL IVPB, 2 g, Intravenous, Q8H, Errika Narvaiz A, MD, Last Rate: 300 mL/hr at 07/17/18 0522, 2 g at 07/17/18 0522 .  carvedilol (COREG) tablet 12.5 mg, 12.5 mg, Oral, Daily, Judith Part, MD, 12.5 mg at 07/16/18 0943 .  Chlorhexidine Gluconate Cloth 2 % PADS 6 each, 6 each, Topical, Daily, Judith Part, MD, 6 each at 07/13/18 0932 .  docusate sodium (COLACE) capsule 100 mg, 100 mg, Oral, BID, Judith Part, MD, 100 mg at 07/16/18 0943 .  heparin injection 5,000 Units, 5,000 Units, Subcutaneous, Q8H, Exa Bomba, Joyice Faster, MD, 5,000 Units at 07/17/18 0522 .  HYDROcodone-acetaminophen (NORCO/VICODIN) 5-325 MG per tablet 1 tablet, 1 tablet, Oral, Q4H PRN, Judith Part, MD, 1 tablet at 07/16/18 0943 .  HYDROmorphone (DILAUDID) injection 0.5 mg, 0.5 mg, Intravenous, Q3H PRN, Judith Part, MD, 0.5 mg at 07/16/18 1942 .  insulin detemir (LEVEMIR) injection 10 Units, 10 Units, Subcutaneous, QPM, Judith Part, MD, 10 Units at 07/16/18 1900 .  labetalol (NORMODYNE,TRANDATE) injection 10-40 mg, 10-40 mg, Intravenous, Q10 min PRN, Judith Part, MD, 20 mg at 07/15/18 0116 .  mirtazapine (REMERON) tablet 7.5 mg, 7.5 mg, Oral, QHS, Markiya Keefe A, MD, 7.5 mg at 07/15/18 2212 .  naphazoline-glycerin (CLEAR EYES REDNESS) ophth solution 1 drop, 1 drop, Right Eye, QID, Judith Part, MD, 1 drop at 07/16/18 2202 .  ondansetron (ZOFRAN) tablet 4 mg, 4 mg, Oral, Q4H PRN **OR** ondansetron (ZOFRAN) injection 4 mg, 4 mg, Intravenous, Q4H PRN, Judith Part, MD .  polyethylene  glycol (MIRALAX / GLYCOLAX) packet 17 g, 17 g, Oral, Daily PRN, Judith Part, MD .  promethazine (PHENERGAN) tablet 12.5-25 mg, 12.5-25 mg, Oral, Q4H PRN, Carlyn Lemke A, MD .  sodium chloride flush (NS) 0.9 % injection 10-40 mL, 10-40 mL, Intracatheter, Q12H, Shaylynn Nulty, Joyice Faster, MD, 10 mL at 07/16/18 2202 .  sodium chloride flush (NS) 0.9 % injection 10-40 mL, 10-40 mL, Intracatheter, PRN, Judith Part, MD, 10 mL at 07/15/18 1747   Physical Exam: Awake/alert and interactive, Ox2, PERRL, EOMI, +R HB5, Strength 5/5 x4 Shunt incisions c/d/i R retrosig incision - pseudomeningocele soft, flat, no leakage of CSF  Assessment & Plan: 68 y.o. woman s/p shunt revision for CSF leak  and pseudomeningocele, recovering well.  -refused PICC placement, can try again today with sedation if needed -UCx w/ yeast, given that she is diabetic, will order US kidney to eval for fungus balls -mental status somewhat improved today, Ox2 this morning, pseudomeningocele flat, hopefully shunt continues to work as infection clears -SCDs/TEDs, Nadene Rubins A Lachlan Mckim  07/17/18 8:40 AM

## 2018-07-17 NOTE — Progress Notes (Signed)
Peripherally Inserted Central Catheter/Midline Placement  The IV Nurse has discussed with the patient and/or persons authorized to consent for the patient, the purpose of this procedure and the potential benefits and risks involved with this procedure.  The benefits include less needle sticks, lab draws from the catheter, and the patient may be discharged home with the catheter. Risks include, but not limited to, infection, bleeding, blood clot (thrombus formation), and puncture of an artery; nerve damage and irregular heartbeat and possibility to perform a PICC exchange if needed/ordered by physician.  Alternatives to this procedure were also discussed.  Bard Power PICC patient education guide, fact sheet on infection prevention and patient information card has been provided to patient /or left at bedside.    PICC/Midline Placement Documentation  PICC Single Lumen 25/42/70 PICC Right Basilic 37 cm 0 cm (Active)  Indication for Insertion or Continuance of Line Home intravenous therapies (PICC only) 07/17/2018 10:54 AM  Exposed Catheter (cm) 0 cm 07/17/2018 10:54 AM  Site Assessment Clean;Intact;Dry 07/17/2018 10:54 AM  Line Status Flushed;Blood return noted;Saline locked 07/17/2018 10:54 AM  Dressing Type Transparent 07/17/2018 10:54 AM  Dressing Status Clean;Dry;Intact;Antimicrobial disc in place 07/17/2018 10:54 AM  Dressing Change Due 07/24/18 07/17/2018 10:54 AM       [address] 07/17/2018, 10:57 AM

## 2018-07-17 NOTE — TOC Progression Note (Signed)
Transition of Care [facility]) - Progression Note    Patient Details  Name: Natalie Friedman MRN: [id number] Date of Birth: March 16, 1951  Transition of [facility]) CM/SW Contact  Ella Bodo, RN Phone Number: 07/17/2018, 4:18 PM  Clinical Narrative:  PICC line place today in preparation for home with IV antibiotic therapy.  Awaiting medical stability in order to coordinate dc home with [facility] services.  MD: please note anticipated discharge date so that IV infusion teaching can be coordinated with husband at discharge.  [facility] and [place] are prepared to accept pt anytime upon medical readiness.        Expected Discharge Plan: [place] Barriers to Discharge: Continued Medical Work up  Expected Discharge Plan and Services Expected Discharge Plan: [place]   Discharge Planning Services: CM Consult Post Acute Care Choice: Home Health                       HH Arranged: RN, PT, OT, IV Antibiotics HH Agency: [place] ([facility]), Other - See comment   Social Determinants of Health (SDOH) Interventions    Readmission Risk Interventions No flowsheet data found.  Reinaldo Raddle, RN, BSN  Trauma/Neuro ICU Case Manager [phone]

## 2018-07-17 NOTE — Progress Notes (Signed)
Attempted patient's b/p again but patient is currently turned to her left side and her right side is restricted due to picc line. Patient continues to move when cuff is tightening on arm. B/p taken is inaccurate. Attempted to try on leg but patient became very irritable when cuff began tightening. Does not follow directions when asked to lay still upon taking b/p.

## 2018-07-18 LAB — GLUCOSE, CAPILLARY
Glucose-Capillary: 54 mg/dL — ABNORMAL LOW (ref 70–99)
Glucose-Capillary: 98 mg/dL (ref 70–99)
Glucose-Capillary: 98 mg/dL (ref 70–99)
Glucose-Capillary: 104 mg/dL — ABNORMAL HIGH (ref 70–99)
Glucose-Capillary: 96 mg/dL (ref 70–99)

## 2018-07-18 MED ORDER — ENSURE ENLIVE PO LIQD
237.0000 mL | Freq: Two times a day (BID) | ORAL | Status: DC
  Administered 2018-07-18 – 2018-07-22 (×8): 237 mL via ORAL

## 2018-07-18 NOTE — Progress Notes (Signed)
Initial Nutrition Assessment   RD working remotely.  DOCUMENTATION CODES:   Obesity unspecified  INTERVENTION:  Provide Ensure Enlive po BID, each supplement provides 350 kcal and 20 grams of protein.  Encourage adequate PO intake.   NUTRITION DIAGNOSIS:   Increased nutrient needs related to post-op healing, chronic illness as evidenced by estimated needs.  GOAL:   Patient will meet greater than or equal to 90% of their needs  MONITOR:   PO intake, Supplement acceptance, Labs, I & O's, Weight trends, Skin  REASON FOR ASSESSMENT:   Low Braden    ASSESSMENT:   68 y.o. 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 She unfortunately had recurrence of her pseudomeningocele with CSF leakage and worsening of her mental status, concerning for shunt malfunction. Pt with recurrence of pseudomeningocele with CSF leakage and worsening of mental status, concerning for shunt malfunction. Pt with PMH of CHf, CKD4, DM.   Procedure (4/1): Left ventricular-peritoneal shunt revision (Left Head)   RD contacted pt via inpatient room phone. Pt reports having a poor appetite. Meal completion has been varied from 25-100%. Pt reports appetite additionally has been poor PTA, however unable to quantify meals usually consumed. Pt reports no longer following a vegan diet and now follows a regular diet. RD to order nutritional supplements to aid in caloric and protein needs.   Unable to complete Nutrition-Focused physical exam at this time.   Labs and medications reviewed.   Diet Order:   Diet Order            DIET SOFT Room service appropriate? Yes; Fluid consistency: Thin  Diet effective now              EDUCATION NEEDS:   Not appropriate for education at this time  Skin:  Skin Assessment: Skin Integrity Issues: Skin Integrity Issues:: Incisions Incisions: L head  Last BM:  4/3  Height:   Ht Readings from Last 1 Encounters:   07/10/18 5\' 5"  (1.651 m)    Weight:   Wt Readings from Last 1 Encounters:  07/10/18 94.6 kg    Ideal Body Weight:  56.8 kg  BMI:  Body mass index is 34.71 kg/m.  Estimated Nutritional Needs:   Kcal:  1850-2050  Protein:  90-105 grams  Fluid:  1.8 - 2 L/day    Corrin Parker, MS, RD, LDN Pager # [phone] After hours/ weekend pager # [phone]

## 2018-07-18 NOTE — Progress Notes (Signed)
Physical Therapy Treatment Patient Details Name: Natalie Friedman MRN: [id number] DOB: [birth year] Today's Date: 07/18/2018    History of Present Illness 68 y.o. 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 Admitted 2/24 for  pseudomeningocele with CSF leakage, 2/25 lumbar subarachnoid drain placed remained in hospital until 3/7 d/c'd home. 3/17 returned to hospital due to worsening mental status secondary to shunt malfunction. 3/18 shunt revision performed and d/c'd home. After additional decline in mental status admitted  4/1 returned to hospital for revision of left ventriculoperitoneal shunt.     PT Comments    Pt impulsive and distractible today. It was difficult to keep her on a task, all she wanted to do was get to the chair.  Emphasis on transition to EOB, sitting balance at EOB, transfers and sit to stands.    Follow Up Recommendations  Home health PT;Supervision/Assistance - 24 hour     Equipment Recommendations  None recommended by PT    Recommendations for Other Services       Precautions / Restrictions Precautions Precautions: Fall    Mobility  Bed Mobility Overal bed mobility: Needs Assistance Bed Mobility: Supine to Sit     Supine to sit: HOB elevated;Min assist     General bed mobility comments: cues for direction, tactile cues for initiation, truncal assist up to sitting  Transfers Overall transfer level: Needs assistance Equipment used: Rolling walker (2 wheeled) Transfers: Sit to/from [facility] Sit to Stand: +2 physical assistance;Mod assist(2) Stand pivot transfers: Max assist;Mod assist;+2 physical assistance;+2 safety/equipment       General transfer comment: cues for hand placement, assist to come forward and boost.; heavy steadying assist to take pivotal steps to turn towards recliner, cues for safety as pt wanting to sit prior to backing fully up to  recliner  Ambulation/Gait                 Stairs             Wheelchair Mobility    Modified Rankin (Stroke Patients Only)       Balance Overall balance assessment: Needs assistance   Sitting balance-Leahy Scale: Fair Sitting balance - Comments: able to maintain statically with close minguard assist however when moving dynamically pt with posterior LOB   Standing balance support: Bilateral upper extremity supported Standing balance-Leahy Scale: Poor                              Cognition Arousal/Alertness: Awake/alert Behavior During Therapy: Flat affect;Impulsive Overall Cognitive Status: Impaired/Different from baseline Area of Impairment: Orientation;Attention;Memory;Following commands;Safety/judgement;Awareness;Problem solving                 Orientation Level: Situation Current Attention Level: Focused Memory: Decreased short-term memory Following Commands: Follows one step commands with increased time;Follows one step commands inconsistently(but was sleepy today) Safety/Judgement: Decreased awareness of safety;Decreased awareness of deficits Awareness: Intellectual Problem Solving: Slow processing;Decreased initiation;Difficulty sequencing;Requires verbal cues;Requires tactile cues General Comments: pt initially impulsive and very easily distracted requiring max cues for redirection and for safety, at times responding to therapist with nonsensical words or statements      Exercises      General Comments        Pertinent Vitals/Pain Pain Assessment: Faces Faces Pain Scale: Hurts a little bit Pain Location: vague reports while moving Pain Descriptors / Indicators: Grimacing Pain Intervention(s): Monitored during session  Home Living                      Prior Function            PT Goals (current goals can now be found in the care plan section) Acute Rehab PT Goals Patient Stated Goal: none stated PT Goal  Formulation: Patient unable to participate in goal setting Time For Goal Achievement: 07/25/18 Potential to Achieve Goals: Fair Progress towards PT goals: Progressing toward goals    Frequency    Min 4X/week      PT Plan Current plan remains appropriate    Co-evaluation PT/OT/SLP Co-Evaluation/Treatment: Yes Reason for Co-Treatment: Necessary to address cognition/behavior during functional activity PT goals addressed during session: Mobility/safety with mobility;Strengthening/ROM OT goals addressed during session: ADL's and self-care      AM-PAC PT "6 Clicks" Mobility   Outcome Measure  Help needed turning from your back to your side while in a flat bed without using bedrails?: A Lot Help needed moving from lying on your back to sitting on the side of a flat bed without using bedrails?: A Lot Help needed moving to and from a bed to a chair (including a wheelchair)?: A Lot Help needed standing up from a chair using your arms (e.g., wheelchair or bedside chair)?: A Lot Help needed to walk in hospital room?: Total Help needed climbing 3-5 steps with a railing? : Total 6 Click Score: 10    End of Session   Activity Tolerance: Patient tolerated treatment well Patient left: in chair;with call bell/phone within reach;with chair alarm set Nurse Communication: Mobility status;Need for lift equipment PT Visit Diagnosis: Unsteadiness on feet (R26.81);Other abnormalities of gait and mobility (R26.89);Muscle weakness (generalized) (M62.81)     Time: 5638-9373 PT Time Calculation (min) (ACUTE ONLY): 24 min  Charges:  $Therapeutic Activity: 8-22 mins                     07/18/2018  Donnella Sham, PT Acute Rehabilitation Services [phone]  (pager) [phone]  (office)   Tessie Fass Youa Deloney 07/18/2018, 6:30 PM

## 2018-07-18 NOTE — Progress Notes (Signed)
Neurosurgery Service Progress Note  Subjective: No acute events overnight, no new complaints this morning  Objective: Vitals:   07/17/18 1200 07/17/18 1957 07/17/18 2336 07/18/18 0438  BP: (!) 159/91 (!) 161/121 (!) 188/118 (!) 197/98  Pulse:  93 94 92  Resp: 18     Temp: 97.7 F (36.5 C) 97.7 F (36.5 C) 98.7 F (37.1 C) 98.2 F (36.8 C)  TempSrc: Axillary Oral Oral Oral  SpO2: 90% 90% 96% 99%  Weight:      Height:       Temp (24hrs), Avg:98.1 F (36.7 C), Min:97.7 F (36.5 C), Max:98.7 F (37.1 C)  CBC Latest Ref Rng & Units 07/15/2018 07/10/2018 06/26/2018  WBC 4.0 - 10.5 K/uL 6.3 14.0(H) 8.0  Hemoglobin 12.0 - 15.0 g/dL 11.8(L) 13.3 12.5  Hematocrit 36.0 - 46.0 % 37.8 40.2 39.7  Platelets 150 - 400 K/uL 186 161 188   BMP Latest Ref Rng & Units 07/15/2018 07/10/2018 06/26/2018  Glucose 70 - 99 mg/dL 117(H) - 96  BUN 8 - 23 mg/dL 27(H) - 35(H)  Creatinine 0.44 - 1.00 mg/dL 2.13(H) 2.80(H) 2.56(H)  Sodium 135 - 145 mmol/L 145 - 139  Potassium 3.5 - 5.1 mmol/L 3.3(L) - 4.2  Chloride 98 - 111 mmol/L 108 - 108  CO2 22 - 32 mmol/L 29 - 22  Calcium 8.9 - 10.3 mg/dL 8.6(L) - 10.2    Intake/Output Summary (Last 24 hours) at 07/18/2018 1638 Last data filed at 07/18/2018 4665 Gross per 24 hour  Intake 100 ml  Output 600 ml  Net -500 ml    Current Facility-Administered Medications:  .  acetaminophen (TYLENOL) tablet 650 mg, 650 mg, Oral, Q4H PRN **OR** acetaminophen (TYLENOL) suppository 650 mg, 650 mg, Rectal, Q4H PRN, Esbeydi Manago A, MD .  amLODipine (NORVASC) tablet 5 mg, 5 mg, Oral, QPM, Tavis Kring A, MD, 5 mg at 07/17/18 1800 .  ampicillin (OMNIPEN) 2 g in sodium chloride 0.9 % 100 mL IVPB, 2 g, Intravenous, Q8H, Anquanette Bahner A, MD, Last Rate: 300 mL/hr at 07/18/18 0502, 2 g at 07/18/18 0502 .  carvedilol (COREG) tablet 12.5 mg, 12.5 mg, Oral, Daily, Trish Mancinelli A, MD, 12.5 mg at 07/17/18 1146 .  Chlorhexidine Gluconate Cloth 2 % PADS 6 each, 6 each,  Topical, Daily, Judith Part, MD, 6 each at 07/17/18 1916 .  docusate sodium (COLACE) capsule 100 mg, 100 mg, Oral, BID, Mia Winthrop A, MD, 100 mg at 07/17/18 2053 .  heparin injection 5,000 Units, 5,000 Units, Subcutaneous, Q8H, Nihaal Friesen, Joyice Faster, MD, 5,000 Units at 07/18/18 0502 .  HYDROcodone-acetaminophen (NORCO/VICODIN) 5-325 MG per tablet 1 tablet, 1 tablet, Oral, Q4H PRN, Judith Part, MD, 1 tablet at 07/16/18 0943 .  HYDROmorphone (DILAUDID) injection 0.5 mg, 0.5 mg, Intravenous, Q3H PRN, Judith Part, MD, 0.5 mg at 07/17/18 1000 .  insulin detemir (LEVEMIR) injection 10 Units, 10 Units, Subcutaneous, QPM, Judith Part, MD, 10 Units at 07/17/18 1800 .  labetalol (NORMODYNE,TRANDATE) injection 10-40 mg, 10-40 mg, Intravenous, Q10 min PRN, Judith Part, MD, 20 mg at 07/15/18 0116 .  mirtazapine (REMERON) tablet 7.5 mg, 7.5 mg, Oral, QHS, Tenleigh Byer A, MD, 7.5 mg at 07/17/18 2053 .  naphazoline-glycerin (CLEAR EYES REDNESS) ophth solution 1 drop, 1 drop, Right Eye, QID, Judith Part, MD, 1 drop at 07/17/18 2052 .  ondansetron (ZOFRAN) tablet 4 mg, 4 mg, Oral, Q4H PRN **OR** ondansetron (ZOFRAN) injection 4 mg, 4 mg, Intravenous, Q4H PRN, Alfonse Garringer, Joyice Faster,  MD .  polyethylene glycol (MIRALAX / GLYCOLAX) packet 17 g, 17 g, Oral, Daily PRN, Judith Part, MD .  promethazine (PHENERGAN) tablet 12.5-25 mg, 12.5-25 mg, Oral, Q4H PRN, Jeanine Caven A, MD .  sodium chloride flush (NS) 0.9 % injection 10-40 mL, 10-40 mL, Intracatheter, Q12H, Dillen Belmontes, Joyice Faster, MD, 10 mL at 07/17/18 2057 .  sodium chloride flush (NS) 0.9 % injection 10-40 mL, 10-40 mL, Intracatheter, PRN, Judith Part, MD, 10 mL at 07/15/18 4163 .  sodium chloride flush (NS) 0.9 % injection 10-40 mL, 10-40 mL, Intracatheter, Q12H, Chaley Castellanos, Joyice Faster, MD, 10 mL at 07/17/18 2056 .  sodium chloride flush (NS) 0.9 % injection 10-40 mL, 10-40 mL, Intracatheter, PRN,  Judith Part, MD   Physical Exam: Awake/alert, less interactive today, Ox1, PERRL, EOMI, +R HB5, Strength 5/5 x4 Shunt incisions c/d/i R retrosig incision - pseudomeningocele soft, flat, no leakage of CSF  Assessment & Plan: 68 y.o. woman s/p shunt revision for CSF leak and pseudomeningocele, intra-op CSF sampling started growing E. Faecalis, amp sensitive.  -continue to monitor pseudomeningocele to assess shunt function -continue ampicillin, stop date 07/21/18 -CKD, renal function stable -UCx w/ yeast, [place] w/ no fungus balls -SCDs/TEDs, SQH  Judith Part  07/18/18 9:21 AM

## 2018-07-18 NOTE — Progress Notes (Signed)
Occupational Therapy Treatment Patient Details Name: Natalie Friedman MRN: [id number] DOB: 1950-12-13 Today's Date: 07/18/2018    History of present illness 68 y.o. 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 Admitted 2/24 for  pseudomeningocele with CSF leakage, 2/25 lumbar subarachnoid drain placed remained in hospital until 3/7 d/c'd home. 3/17 returned to hospital due to worsening mental status secondary to shunt malfunction. 3/18 shunt revision performed and d/c'd home. After additional decline in mental status admitted  4/1 returned to hospital for revision of left ventriculoperitoneal shunt.    OT comments  Pt presents supine in bed agreeable to therapy session. Pt continues to present with significant cognitive impairments, presenting with impulsivities and very easily distracted today. Pt requires cues for attending to ADL tasks as well as sequencing and properly using ADL items. She requires modA for completion of oral care seated EOB, overall maintaining sitting balance with minguard assist though with dynamic movement requiring at least minA to maintain due to posterior LOB. Pt performing stand pivot transfers with maxA+2 using RW. Updated discharge recommendations as feel she if more appropriate for SNF at this time vs CIR. Will continue to follow acutely.     Follow Up Recommendations  SNF;Supervision/Assistance - 24 hour    Equipment Recommendations  Wheelchair (measurements OT);Wheelchair cushion (measurements OT);3 in 1 bedside commode          Precautions / Restrictions Precautions Precautions: Fall       Mobility Bed Mobility Overal bed mobility: Needs Assistance Bed Mobility: Supine to Sit     Supine to sit: HOB elevated;Min assist     General bed mobility comments: cues for direction, tactile cues for initiation, truncal assist up to sitting  Transfers Overall transfer level: Needs  assistance Equipment used: Rolling walker (2 wheeled) Transfers: Sit to/from [facility] Sit to Stand: +2 physical assistance;Mod assist(2) Stand pivot transfers: Max assist;Mod assist;+2 physical assistance;+2 safety/equipment       General transfer comment: cues for hand placement, assist to come forward and boost.; heavy steadying assist to take pivotal steps to turn towards recliner, cues for safety as pt wanting to sit prior to backing fully up to recliner    Balance Overall balance assessment: Needs assistance   Sitting balance-Leahy Scale: Fair Sitting balance - Comments: able to maintain statically with close minguard assist however when moving dynamically pt with posterior LOB   Standing balance support: Bilateral upper extremity supported Standing balance-Leahy Scale: Poor                             ADL either performed or assessed with clinical judgement   ADL Overall ADL's : Needs assistance/impaired     Grooming: Wash/dry face;Oral care;Moderate assistance;Sitting Grooming Details (indicate cue type and reason): cues for sequencing and proper use of grooming ADL items, pt easily distracted during task                             Functional mobility during ADLs: Moderate assistance;Maximal assistance;+2 for physical assistance;+2 for safety/equipment;Rolling walker       Vision       Perception     Praxis      Cognition Arousal/Alertness: Awake/alert Behavior During Therapy: Flat affect;Impulsive Overall Cognitive Status: Impaired/Different from baseline Area of Impairment: Orientation;Attention;Memory;Following commands;Safety/judgement;Awareness;Problem solving  Orientation Level: Situation Current Attention Level: Focused Memory: Decreased short-term memory Following Commands: Follows one step commands with increased time;Follows one step commands inconsistently(but was sleepy  today) Safety/Judgement: Decreased awareness of safety;Decreased awareness of deficits Awareness: Intellectual Problem Solving: Slow processing;Decreased initiation;Difficulty sequencing;Requires verbal cues;Requires tactile cues General Comments: pt initially impulsive and very easily distracted requiring max cues for redirection and for safety, at times responding to therapist with nonsensical words or statements        Exercises     Shoulder Instructions       General Comments      Pertinent Vitals/ Pain       Pain Assessment: Faces Faces Pain Scale: Hurts a little bit Pain Location: vague reports while moving Pain Descriptors / Indicators: Grimacing Pain Intervention(s): Monitored during session  Home Living                                          Prior Functioning/Environment              Frequency  Min 2X/week        Progress Toward Goals  OT Goals(current goals can now be found in the care plan section)  Progress towards OT goals: Progressing toward goals  Acute Rehab OT Goals Patient Stated Goal: none stated OT Goal Formulation: With patient Time For Goal Achievement: 07/25/18 Potential to Achieve Goals: Good  Plan Discharge plan needs to be updated    Co-evaluation    PT/OT/SLP Co-Evaluation/Treatment: Yes Reason for Co-Treatment: Necessary to address cognition/behavior during functional activity;For patient/therapist safety;To address functional/ADL transfers   OT goals addressed during session: ADL's and self-care      AM-PAC OT "6 Clicks" Daily Activity     Outcome Measure   Help from another person eating meals?: A Little Help from another person taking care of personal grooming?: A Little Help from another person toileting, which includes using toliet, bedpan, or urinal?: A Lot Help from another person bathing (including washing, rinsing, drying)?: A Lot Help from another person to put on and taking off regular upper  body clothing?: A Lot Help from another person to put on and taking off regular lower body clothing?: A Lot 6 Click Score: 14    End of Session Equipment Utilized During Treatment: Rolling walker  OT Visit Diagnosis: Muscle weakness (generalized) (M62.81);Other symptoms and signs involving cognitive function;Unsteadiness on feet (R26.81)   Activity Tolerance Patient tolerated treatment well   Patient Left in chair;with call bell/phone within reach;with chair alarm set   Nurse Communication Mobility status        Time: 8250-0370 OT Time Calculation (min): 24 min  Charges: OT General Charges $OT Visit: 1 Visit OT Treatments $Self Care/Home Management : 8-22 mins  Lou Cal, [place] Pager [phone] Office [place] 07/18/2018, 4:55 PM

## 2018-07-18 NOTE — Care Management Important Message (Signed)
Important Message  Patient Details  Name: Natalie Friedman MRN: [id number] Date of Birth: Dec 10, 1950   Medicare Important Message Given:  Yes    Zoya Sprecher Montine Circle 07/18/2018, 3:51 PM

## 2018-07-18 NOTE — Progress Notes (Signed)
Inpatient Diabetes Program Recommendations  AACE/ADA: New Consensus Statement on Inpatient Glycemic Control  Target Ranges:  Prepandial:   less than 140 mg/dL      Peak postprandial:   less than 180 mg/dL (1-2 hours)      Critically ill patients:  140 - 180 mg/dL   Results for DELANNA, BLACKETER (MRN [id number]) as of 07/18/2018 11:09  Ref. Range 07/17/2018 08:07 07/17/2018 13:08 07/17/2018 17:35 07/17/2018 21:41 07/18/2018 07:34 07/18/2018 08:35  Glucose-Capillary Latest Ref Range: 70 - 99 mg/dL 73 109 (H) 96 98 54 (L) 98   Review of Glycemic Control  Diabetes history: DM2 Outpatient Diabetes medications: Amaryl 1 mg daily, Levemir 10 units  QPM Current orders for Inpatient glycemic control: Levemir 10 units QPM  Inpatient Diabetes Program Recommendations:  Insulin - Basal: Please discontinue Levemir. Correction (SSI): Please consider ordering CBGs ACHS with Novolog 0-9 units TID with meals and Novolog 0-5 units QHS.  Thanks, Barnie Alderman, RN, MSN, CDE Diabetes Coordinator Inpatient Diabetes Program [phone] (Team Pager from 8am to 5pm)

## 2018-07-19 LAB — GLUCOSE, CAPILLARY
Glucose-Capillary: 93 mg/dL (ref 70–99)
Glucose-Capillary: 138 mg/dL — ABNORMAL HIGH (ref 70–99)
Glucose-Capillary: 155 mg/dL — ABNORMAL HIGH (ref 70–99)
Glucose-Capillary: 131 mg/dL — ABNORMAL HIGH (ref 70–99)

## 2018-07-19 MED ORDER — INSULIN ASPART 100 UNIT/ML ~~LOC~~ SOLN
0.0000 [IU] | Freq: Three times a day (TID) | SUBCUTANEOUS | Status: DC
  Administered 2018-07-19: 13:00:00 1 [IU] via SUBCUTANEOUS
  Administered 2018-07-20 (×2): 2 [IU] via SUBCUTANEOUS
  Administered 2018-07-21: 1 [IU] via SUBCUTANEOUS
  Administered 2018-07-21 – 2018-07-22 (×2): 2 [IU] via SUBCUTANEOUS

## 2018-07-19 MED ORDER — INSULIN ASPART 100 UNIT/ML ~~LOC~~ SOLN
0.0000 [IU] | Freq: Every day | SUBCUTANEOUS | Status: DC
  Administered 2018-07-21: 2 [IU] via SUBCUTANEOUS

## 2018-07-19 NOTE — Progress Notes (Signed)
Subjective: Patient reports some mild headache but doing ok, no acute events overnight  Objective: Vital signs in last 24 hours: Temp:  [97.9 F (36.6 C)-98.8 F (37.1 C)] 98.8 F (37.1 C) (04/10 0400) Pulse Rate:  [79-109] 79 (04/10 0400) Resp:  [16-18] 18 (04/10 0400) BP: (153-202)/(75-109) 153/87 (04/10 0400) SpO2:  [95 %-99 %] 96 % (04/10 0400)  Intake/Output from previous day: 04/09 0701 - 04/10 0700 In: 360 [P.O.:360] Out: 1150 [Urine:1150] Intake/Output this shift: No intake/output data recorded.  Neurologic: Grossly normal  Lab Results: Lab Results  Component Value Date   WBC 6.3 07/15/2018   HGB 11.8 (L) 07/15/2018   HCT 37.8 07/15/2018   MCV 94.5 07/15/2018   PLT 186 07/15/2018   Lab Results  Component Value Date   INR 0.94 07/17/2016   BMET Lab Results  Component Value Date   NA 145 07/15/2018   K 3.3 (L) 07/15/2018   CL 108 07/15/2018   CO2 29 07/15/2018   GLUCOSE 117 (H) 07/15/2018   BUN 27 (H) 07/15/2018   CREATININE 2.13 (H) 07/15/2018   CALCIUM 8.6 (L) 07/15/2018    Studies/Results: US Renal  Result Date: 07/17/2018 CLINICAL DATA:  Fungal cystitis EXAM: RENAL / URINARY TRACT ULTRASOUND COMPLETE COMPARISON:  04/22/2018 FINDINGS: Right Kidney: Renal measurements: 10.8 x 4.9 x 5.5 cm = volume: 152 mL. Mild increased renal echogenicity. Mild cortical thinning. No hydronephrosis. Trace perinephric strandy edema. No focal renal abnormality. Left Kidney: Renal measurements: 11.1 x 5.8 x 5.3 cm = volume: 179 mL. Mild increased renal echogenicity. Slight cortical thinning. No hydronephrosis or acute finding. No focal renal abnormality. Trace perinephric free fluid. Bladder: Decompressed by Foley catheter. Echogenic shadowing noted within the bladder anteriorly, suspect air artifact within the bladder from instrumentation. IMPRESSION: No acute finding by renal ultrasound or hydronephrosis. Mild increased renal echogenicity and cortical thinning, suspect  chronic medical renal disease. Electronically Signed   By: Jerilynn Mages.  Shick M.D.   On: 07/17/2018 12:45    Assessment/Plan: Shunt incision CDI, pseudomeningocele soft and boggy, no csf leakage. OT recommending SNF placement. Continue therapies today   LOS: 9 days    Ocie Cornfield Vicente Weidler 07/19/2018, 8:12 AM

## 2018-07-19 NOTE — Progress Notes (Signed)
Physical Therapy Treatment Patient Details Name: Natalie Friedman MRN: [id number] DOB: [birth year] Today's Date: 07/19/2018    History of Present Illness 68 y.o. 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 Admitted 2/24 for  pseudomeningocele with CSF leakage, 2/25 lumbar subarachnoid drain placed remained in hospital until 3/7 d/c'd home. 3/17 returned to hospital due to worsening mental status secondary to shunt malfunction. 3/18 shunt revision performed and d/c'd home. After additional decline in mental status admitted  4/1 returned to hospital for revision of left ventriculoperitoneal shunt.     PT Comments    Pt lethargic again, but could be encourage to participate once more aroused.  Emphasis remains to improve transitions to EOB, sitting balance, sit to stands into the RW and some method of transfer.    Follow Up Recommendations  Home health PT;Supervision/Assistance - 24 hour(per family not going back to CIR)     Equipment Recommendations  None recommended by PT    Recommendations for Other Services       Precautions / Restrictions Precautions Precautions: Fall    Mobility  Bed Mobility Overal bed mobility: Needs Assistance Bed Mobility: Supine to Sit     Supine to sit: Mod assist;HOB elevated;Max assist     General bed mobility comments: cues for direction, tactile cues for initiation, truncal assist up to sitting  Transfers Overall transfer level: Needs assistance Equipment used: Rolling walker (2 wheeled) Transfers: Sit to/from [employer] Sit to Stand: +2 physical assistance;Mod assist   Squat pivot transfers: Max assist;+2 safety/equipment     General transfer comment: cues for hand placement, assist to come forward and boost.  Face to face assist to complete squat pivot only  Ambulation/Gait                 Stairs             Wheelchair  Mobility    Modified Rankin (Stroke Patients Only)       Balance Overall balance assessment: Needs assistance Sitting-balance support: No upper extremity supported;Single extremity supported Sitting balance-Leahy Scale: Fair Sitting balance - Comments: initially with posterior list, but finally can sitting without UE assist , but can not accept challenge.   Standing balance support: Bilateral upper extremity supported Standing balance-Leahy Scale: Poor Standing balance comment: reliant on external support and/or AD                            Cognition Arousal/Alertness: Lethargic;Awake/alert Behavior During Therapy: Flat affect Overall Cognitive Status: Impaired/Different from baseline Area of Impairment: Orientation;Attention;Memory;Following commands;Safety/judgement;Awareness;Problem solving                 Orientation Level: Situation Current Attention Level: Focused Memory: Decreased short-term memory Following Commands: Follows one step commands with increased time Safety/Judgement: Decreased awareness of safety;Decreased awareness of deficits Awareness: Intellectual Problem Solving: Slow processing;Decreased initiation;Difficulty sequencing;Requires verbal cues;Requires tactile cues General Comments: max cues for redirection.      Exercises Other Exercises Other Exercises: warm up ROM to improve stiffness and help pt wake up.    General Comments        Pertinent Vitals/Pain Pain Assessment: Faces Faces Pain Scale: Hurts a little bit Pain Location: vague reports while moving Pain Descriptors / Indicators: Moaning;Discomfort;Grimacing Pain Intervention(s): Monitored during session    Home Living  Prior Function            PT Goals (current goals can now be found in the care plan section) Acute Rehab PT Goals Patient Stated Goal: none stated PT Goal Formulation: Patient unable to participate in goal  setting Time For Goal Achievement: 07/25/18 Potential to Achieve Goals: Fair Progress towards PT goals: Progressing toward goals    Frequency    Min 4X/week      PT Plan Current plan remains appropriate    Co-evaluation              AM-PAC PT "6 Clicks" Mobility   Outcome Measure  Help needed turning from your back to your side while in a flat bed without using bedrails?: A Lot Help needed moving from lying on your back to sitting on the side of a flat bed without using bedrails?: A Lot Help needed moving to and from a bed to a chair (including a wheelchair)?: Total Help needed standing up from a chair using your arms (e.g., wheelchair or bedside chair)?: A Lot Help needed to walk in hospital room?: Total Help needed climbing 3-5 steps with a railing? : Total 6 Click Score: 9    End of Session   Activity Tolerance: Patient tolerated treatment well Patient left: in chair;with call bell/phone within reach;with chair alarm set Nurse Communication: Mobility status PT Visit Diagnosis: Unsteadiness on feet (R26.81);Other abnormalities of gait and mobility (R26.89);Muscle weakness (generalized) (M62.81)     Time: 4469-5072 PT Time Calculation (min) (ACUTE ONLY): 20 min  Charges:  $Therapeutic Activity: 8-22 mins                     07/19/2018  Donnella Sham, PT Acute Rehabilitation Services [phone]  (pager) [phone]  (office)   [name] 07/19/2018, 5:05 PM

## 2018-07-20 LAB — CBC WITH DIFFERENTIAL/PLATELET
Abs Immature Granulocytes: 0.04 10*3/uL (ref 0.00–0.07)
Basophils Absolute: 0 10*3/uL (ref 0.0–0.1)
Basophils Relative: 0 %
Eosinophils Absolute: 0.1 10*3/uL (ref 0.0–0.5)
Eosinophils Relative: 2 %
HCT: 34.9 % — ABNORMAL LOW (ref 36.0–46.0)
Hemoglobin: 11 g/dL — ABNORMAL LOW (ref 12.0–15.0)
Immature Granulocytes: 1 %
Lymphocytes Relative: 17 %
Lymphs Abs: 0.9 10*3/uL (ref 0.7–4.0)
MCH: 29.7 pg (ref 26.0–34.0)
MCHC: 31.5 g/dL (ref 30.0–36.0)
MCV: 94.3 fL (ref 80.0–100.0)
Monocytes Absolute: 0.5 10*3/uL (ref 0.1–1.0)
Monocytes Relative: 9 %
Neutro Abs: 3.7 10*3/uL (ref 1.7–7.7)
Neutrophils Relative %: 71 %
Platelets: 160 10*3/uL (ref 150–400)
RBC: 3.7 MIL/uL — ABNORMAL LOW (ref 3.87–5.11)
RDW: 12.9 % (ref 11.5–15.5)
WBC: 5.2 10*3/uL (ref 4.0–10.5)
nRBC: 0 % (ref 0.0–0.2)

## 2018-07-20 LAB — GLUCOSE, CAPILLARY
Glucose-Capillary: 89 mg/dL (ref 70–99)
Glucose-Capillary: 90 mg/dL (ref 70–99)
Glucose-Capillary: 156 mg/dL — ABNORMAL HIGH (ref 70–99)
Glucose-Capillary: 184 mg/dL — ABNORMAL HIGH (ref 70–99)
Glucose-Capillary: 146 mg/dL — ABNORMAL HIGH (ref 70–99)

## 2018-07-20 LAB — COMPREHENSIVE METABOLIC PANEL
ALT: 6 U/L (ref 0–44)
AST: 12 U/L — ABNORMAL LOW (ref 15–41)
Albumin: 2.2 g/dL — ABNORMAL LOW (ref 3.5–5.0)
Alkaline Phosphatase: 46 U/L (ref 38–126)
Anion gap: 7 (ref 5–15)
BUN: 15 mg/dL (ref 8–23)
CO2: 29 mmol/L (ref 22–32)
Calcium: 9.2 mg/dL (ref 8.9–10.3)
Chloride: 107 mmol/L (ref 98–111)
Creatinine, Ser: 1.96 mg/dL — ABNORMAL HIGH (ref 0.44–1.00)
GFR calc Af Amer: 29 mL/min — ABNORMAL LOW (ref >60)
GFR calc non Af Amer: 25 mL/min — ABNORMAL LOW (ref >60)
Glucose, Bld: 130 mg/dL — ABNORMAL HIGH (ref 70–99)
Potassium: 3.5 mmol/L (ref 3.5–5.1)
Sodium: 143 mmol/L (ref 135–145)
Total Bilirubin: 0.6 mg/dL (ref 0.3–1.2)
Total Protein: 5 g/dL — ABNORMAL LOW (ref 6.5–8.1)

## 2018-07-20 MED ORDER — SODIUM CHLORIDE 0.9 % IV SOLN
INTRAVENOUS | Status: DC
  Administered 2018-07-20 – 2018-07-22 (×3): via INTRAVENOUS

## 2018-07-20 NOTE — Progress Notes (Addendum)
  NEUROSURGERY PROGRESS NOTE   No issues overnight. Complains of headache Nursing notes gradual progressive lethargy and confusion  EXAM:  BP 134/79 (BP Location: Left Arm)   Pulse 71   Temp 98.4 F (36.9 C) (Axillary)   Resp 18   Ht 5\' 5"  (1.651 m)   Wt 94.6 kg   LMP  (LMP Unknown)   SpO2 98%   BMI 34.71 kg/m   Lays with eyes closed, but does follow commands with encouragement Not oriented to self, location, year. Answers questions inappropriately  Does move all extremities well Incision: pseudomeningocele flat, no drainage   IMPRESSION/PLAN 68 y.o. female s/p shunt revision for CSF leak and pseudomeningocele. CSF + E. Faecalis, amp sensitive, last dose scheduled for tomorrow. Appears grossly neurologically stable - Repeat labs for monitoring - continue supportive care  Addendum Reevaluation about 10 minutes later. Patient much more awake, alert to self but not location/year Follows commands Continue with plan for repeat labs/supportive care

## 2018-07-21 LAB — GLUCOSE, CAPILLARY
Glucose-Capillary: 101 mg/dL — ABNORMAL HIGH (ref 70–99)
Glucose-Capillary: 139 mg/dL — ABNORMAL HIGH (ref 70–99)
Glucose-Capillary: 166 mg/dL — ABNORMAL HIGH (ref 70–99)
Glucose-Capillary: 210 mg/dL — ABNORMAL HIGH (ref 70–99)

## 2018-07-21 NOTE — Progress Notes (Signed)
  NEUROSURGERY PROGRESS NOTE   No issues overnight.  Reports feeling okay Nursing reports much more lucid compared to yesterday  EXAM:  BP 136/75 (BP Location: Left Arm)   Pulse 85   Temp 98.5 F (36.9 C) (Oral)   Resp 20   Ht 5\' 5"  (1.651 m)   Wt 94.6 kg   LMP  (LMP Unknown)   SpO2 95%   BMI 34.71 kg/m   Awake, alert oriented to name, year of birth but not day, not oriented to location MAEW Incision: pseudomeningocele flat, no drainage   PLAN Neurologically stable this am Continue supportive care Family apparently denies CIR. May need to reconsider   Ferne Reus, [facility]

## 2018-07-22 LAB — GLUCOSE, CAPILLARY
Glucose-Capillary: 110 mg/dL — ABNORMAL HIGH (ref 70–99)
Glucose-Capillary: 180 mg/dL — ABNORMAL HIGH (ref 70–99)

## 2018-07-22 MED ORDER — ENSURE ENLIVE PO LIQD: 237.0000 mL | Freq: Two times a day (BID) | ORAL | 12 refills | Status: AC

## 2018-07-22 NOTE — Care Management Important Message (Signed)
Important Message  Patient Details  Name: Natalie Friedman MRN: [id number] Date of Birth: 10/16/1950   Medicare Important Message Given:  Yes    Trayce Caravello Montine Circle 07/22/2018, 4:30 PM

## 2018-07-22 NOTE — Discharge Summary (Signed)
Discharge Summary  Date of Admission: 07/10/2018  Date of Discharge: 07/22/18  Attending Physician: Emelda Brothers, MD  Hospital Course: Patient was admitted following an uncomplicated ventriculoperitoneal shunt revision for shunt malfunction that was diagnosed due to recurrence of her pseudomeningocele with CSF leakage. She was recovered in PACU and transferred to the ICU and then stepdown unit. Intra-operative CSF was sent during the shunt revision, which eventually resulted in a positive culture for amp-sensitive E faecalis. She therefore had a 10d course of ampicillin while monitoring her for clinical improvement and pseudomeningocele recurrence. She clinically progressed well and completed her antibiotic regimen without evidence of shunt malfunciton. Her hospital course was uncomplicated and the patient was discharged home on 07/22/2018. She will follow up in clinic with me in 2 weeks.  Neurologic exam at discharge:  AOx2, PERRL, EOMI, +L HB5, TM Strength 5/5 x4, SILTx4 Pseudomeningocele soft, no leakage  Discharge diagnosis: Communicating hydrocephalus, shunt malfunction  Judith Part, MD 07/22/18 12:50 PM

## 2018-07-22 NOTE — Progress Notes (Signed)
Neurosurgery Service Progress Note  Subjective: No acute events overnight, much more awake and interactive today, starting to get some of her wit back in conversation  Objective: Vitals:   07/21/18 2022 07/21/18 2325 07/22/18 0345 07/22/18 0726  BP: 133/80 (!) 163/72 (!) 141/88 (!) 159/78  Pulse: 81 (!) 57 72 84  Resp: 18 18 18 18   Temp: 99 F (37.2 C) 98.4 F (36.9 C) 98.5 F (36.9 C) 98.8 F (37.1 C)  TempSrc: Oral Oral Oral Oral  SpO2: 96% 94% 93% 97%  Weight:      Height:       Temp (24hrs), Avg:98.3 F (36.8 C), Min:97.3 F (36.3 C), Max:99 F (37.2 C)  CBC Latest Ref Rng & Units 07/20/2018 07/15/2018 07/10/2018  WBC 4.0 - 10.5 K/uL 5.2 6.3 14.0(H)  Hemoglobin 12.0 - 15.0 g/dL 11.0(L) 11.8(L) 13.3  Hematocrit 36.0 - 46.0 % 34.9(L) 37.8 40.2  Platelets 150 - 400 K/uL 160 186 161   BMP Latest Ref Rng & Units 07/20/2018 07/15/2018 07/10/2018  Glucose 70 - 99 mg/dL 130(H) 117(H) -  BUN 8 - 23 mg/dL 15 27(H) -  Creatinine 0.44 - 1.00 mg/dL 1.96(H) 2.13(H) 2.80(H)  Sodium 135 - 145 mmol/L 143 145 -  Potassium 3.5 - 5.1 mmol/L 3.5 3.3(L) -  Chloride 98 - 111 mmol/L 107 108 -  CO2 22 - 32 mmol/L 29 29 -  Calcium 8.9 - 10.3 mg/dL 9.2 8.6(L) -    Intake/Output Summary (Last 24 hours) at 07/22/2018 0838 Last data filed at 07/22/2018 0521 Gross per 24 hour  Intake 780.93 ml  Output 875 ml  Net -94.07 ml    Current Facility-Administered Medications:  .  0.9 %  sodium chloride infusion, , Intravenous, Continuous, Costella, Vista Mink, PA-C, Last Rate: 100 mL/hr at 07/22/18 0028 .  acetaminophen (TYLENOL) tablet 650 mg, 650 mg, Oral, Q4H PRN **OR** acetaminophen (TYLENOL) suppository 650 mg, 650 mg, Rectal, Q4H PRN, Blaklee Shores A, MD .  amLODipine (NORVASC) tablet 5 mg, 5 mg, Oral, QPM, Nonnie Pickney, Joyice Faster, MD, 5 mg at 07/21/18 1734 .  carvedilol (COREG) tablet 12.5 mg, 12.5 mg, Oral, Daily, Zakirah Weingart A, MD, 12.5 mg at 07/21/18 1100 .  Chlorhexidine Gluconate Cloth 2 %  PADS 6 each, 6 each, Topical, Daily, Judith Part, MD, 6 each at 07/21/18 1735 .  docusate sodium (COLACE) capsule 100 mg, 100 mg, Oral, BID, Judith Part, MD, 100 mg at 07/21/18 2202 .  feeding supplement (ENSURE ENLIVE) (ENSURE ENLIVE) liquid 237 mL, 237 mL, Oral, BID BM, Keonta Alsip A, MD, 237 mL at 07/21/18 1415 .  heparin injection 5,000 Units, 5,000 Units, Subcutaneous, Q8H, Judith Part, MD, 5,000 Units at 07/22/18 [phone] .  HYDROcodone-acetaminophen (NORCO/VICODIN) 5-325 MG per tablet 1 tablet, 1 tablet, Oral, Q4H PRN, Judith Part, MD, 1 tablet at 07/16/18 0943 .  HYDROmorphone (DILAUDID) injection 0.5 mg, 0.5 mg, Intravenous, Q3H PRN, Judith Part, MD, 0.5 mg at 07/20/18 2128 .  insulin aspart (novoLOG) injection 0-5 Units, 0-5 Units, Subcutaneous, QHS, Meyran, Ocie Cornfield, NP, 2 Units at 07/21/18 2220 .  insulin aspart (novoLOG) injection 0-9 Units, 0-9 Units, Subcutaneous, TID WC, Meyran, Ocie Cornfield, NP, 2 Units at 07/21/18 1749 .  labetalol (NORMODYNE,TRANDATE) injection 10-40 mg, 10-40 mg, Intravenous, Q10 min PRN, Judith Part, MD, 10 mg at 07/19/18 0845 .  mirtazapine (REMERON) tablet 7.5 mg, 7.5 mg, Oral, QHS, Talena Neira A, MD, 7.5 mg at 07/21/18 2202 .  naphazoline-glycerin (CLEAR  EYES REDNESS) ophth solution 1 drop, 1 drop, Right Eye, QID, Judith Part, MD, 1 drop at 07/21/18 2202 .  ondansetron (ZOFRAN) tablet 4 mg, 4 mg, Oral, Q4H PRN **OR** ondansetron (ZOFRAN) injection 4 mg, 4 mg, Intravenous, Q4H PRN, Jaqwan Wieber A, MD .  polyethylene glycol (MIRALAX / GLYCOLAX) packet 17 g, 17 g, Oral, Daily PRN, Judith Part, MD .  promethazine (PHENERGAN) tablet 12.5-25 mg, 12.5-25 mg, Oral, Q4H PRN, Yamaira Spinner A, MD .  sodium chloride flush (NS) 0.9 % injection 10-40 mL, 10-40 mL, Intracatheter, Q12H, Judith Part, MD, 10 mL at 07/21/18 2203 .  sodium chloride flush (NS) 0.9 % injection 10-40  mL, 10-40 mL, Intracatheter, PRN, Judith Part, MD, 10 mL at 07/15/18 5830 .  sodium chloride flush (NS) 0.9 % injection 10-40 mL, 10-40 mL, Intracatheter, Q12H, Deavion Dobbs, Joyice Faster, MD, 10 mL at 07/21/18 2202 .  sodium chloride flush (NS) 0.9 % injection 10-40 mL, 10-40 mL, Intracatheter, PRN, Judith Part, MD   Physical Exam: Awake/alert, Ox2, PERRL, EOMI, +R HB5, Strength 5/5 x4 Shunt incisions c/d/i R retrosig incision - pseudomeningocele soft, flat, no leakage of CSF  Assessment & Plan: 68 y.o. woman s/p shunt revision for CSF leak and pseudomeningocele, intra-op CSF sampling started growing E. Faecalis, amp sensitive.  -clinically doing well, antibiotic course completed -will d/c foley today, if she can void on her own then can discharge home in the afternoon -SCDs/TEDs, SQH  Judith Part  07/22/18 8:38 AM

## 2018-07-22 NOTE — TOC Transition Note (Signed)
Transition of Care [facility]) - CM/SW Discharge Note   Patient Details  Name: Natalie Friedman MRN: [id number] Date of Birth: 24-May-1950  Transition of Care [facility]) CM/SW Contact:  Ella Bodo, RN Phone Number: 07/22/2018, 4:11 PM   Clinical Narrative: 68 y.o. 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 Admitted 2/24 for  pseudomeningocele with CSF leakage, 2/25 lumbar subarachnoid drain placed remained in hospital until 3/7 d/c'd home. 3/17 returned to hospital due to worsening mental status secondary to shunt malfunction. 3/18 shunt revision performed and d/c'd home. After additional decline in mental status admitted  4/1 returned to hospital for revision of left ventriculoperitoneal shunt.  Pt medically stable for discharge home today with husband, Aaron Edelman and [facility] services as arranged.  Notified [facility] of dc home today.      Final next level of care: [place] Barriers to Discharge: No Barriers Identified   Patient Goals and CMS Choice Patient states their goals for this hospitalization and ongoing recovery are:: Go home CMS Medicare.gov Compare Post Acute Care list provided to:: Patient Choice offered to / list presented to : Spouse  DDischarge Planning Services: CM Consult Post Acute Care Choice: Home Health             Services Arranged:  [facility], PT,OT [place]: [place] ([facility]), Other - See comment     Readmission Risk Interventions No flowsheet data found.  Reinaldo Raddle, RN, BSN  Trauma/Neuro ICU Case Manager [phone]

## 2018-07-22 NOTE — Discharge Instructions (Signed)
Discharge Instructions  No restriction in activities, slowly increase your activity back to normal. Keep your incisions open to air, no need to apply a dressing.   Okay to shower on the day of discharge. Be gentle when cleaning your incision. Use regular soap and water. If that is uncomfortable, try using baby shampoo. Do not submerge the wound under water for 2 weeks after surgery.  Follow up with Dr. Zada Finders in 2 weeks after discharge. If you do not already have a discharge appointment, please call his office at [phone] to schedule a follow up appointment. If you have any concerns or questions, please call the office and let us know.

## 2018-07-22 NOTE — Progress Notes (Signed)
Physical Therapy Treatment Patient Details Name: Natalie Friedman MRN: [id number] DOB: January 26, 1951 Today's Date: 07/22/2018    History of Present Illness 68 y.o. 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 Admitted 2/24 for  pseudomeningocele with CSF leakage, 2/25 lumbar subarachnoid drain placed remained in hospital until 3/7 d/c'd home. 3/17 returned to hospital due to worsening mental status secondary to shunt malfunction. 3/18 shunt revision performed and d/c'd home. After additional decline in mental status admitted  4/1 returned to hospital for revision of left ventriculoperitoneal shunt.     PT Comments    Patient seen for activity progression. Patient very reluctant to participate with therapies. Tolerated EOB and OOB with stedy to chair. Patient showing some improvements in ability to take on weight through LEs during transitional movements but overall remains limited. Patient not agreeable to ambulation attempt. Current POC remains in place. Will see as indicated and progress as tolerated.   Follow Up Recommendations  Home health PT;Supervision/Assistance - 24 hour(per family not going back to CIR)     Equipment Recommendations  None recommended by PT    Recommendations for Other Services       Precautions / Restrictions Precautions Precautions: Fall Restrictions Weight Bearing Restrictions: No    Mobility  Bed Mobility Overal bed mobility: Needs Assistance Bed Mobility: Supine to Sit     Supine to sit: Mod assist;HOB elevated;Max assist     General bed mobility comments: Moderate assist to elevate trunk and rotate hips to EOB, increased time and effort to perform.  Transfers Overall transfer level: Needs assistance   Transfers: Sit to/from [employer] Sit to Stand: Min assist;+2 physical assistance         General transfer comment: Performed sit <> stand with  steady multiple times during session.   Ambulation/Gait             General Gait Details: patient not willing to try    Stairs             Wheelchair Mobility    Modified Rankin (Stroke Patients Only)       Balance Overall balance assessment: Needs assistance Sitting-balance support: No upper extremity supported;Single extremity supported Sitting balance-Leahy Scale: Fair Sitting balance - Comments: able to maintain sitting balance at EOB   Standing balance support: Bilateral upper extremity supported Standing balance-Leahy Scale: Poor Standing balance comment: continues to require external assist                            Cognition Arousal/Alertness: Awake/alert Behavior During Therapy: Flat affect Overall Cognitive Status: Impaired/Different from baseline Area of Impairment: Orientation;Attention;Memory;Following commands;Safety/judgement;Awareness;Problem solving                 Orientation Level: Situation Current Attention Level: Focused Memory: Decreased short-term memory Following Commands: Follows one step commands with increased time Safety/Judgement: Decreased awareness of safety;Decreased awareness of deficits Awareness: Intellectual Problem Solving: Slow processing;Decreased initiation;Difficulty sequencing;Requires verbal cues;Requires tactile cues General Comments: Easily distracted, perseverating on having someone help her leave, max cues for direction to task performance and encouragement for engagement in activity      Exercises      General Comments        Pertinent Vitals/Pain Pain Assessment: Faces Faces Pain Scale: Hurts a little bit Pain Location: generalized Pain Descriptors / Indicators: Moaning;Discomfort;Grimacing Pain Intervention(s): Monitored during session    Home Living  Prior Function            PT Goals (current goals can now be found in the care plan section)  Acute Rehab PT Goals Patient Stated Goal: none stated PT Goal Formulation: Patient unable to participate in goal setting Time For Goal Achievement: 07/25/18 Potential to Achieve Goals: Fair Progress towards PT goals: Progressing toward goals(modestly)    Frequency    Min 4X/week      PT Plan Current plan remains appropriate    Co-evaluation              AM-PAC PT "6 Clicks" Mobility   Outcome Measure  Help needed turning from your back to your side while in a flat bed without using bedrails?: A Lot Help needed moving from lying on your back to sitting on the side of a flat bed without using bedrails?: A Lot Help needed moving to and from a bed to a chair (including a wheelchair)?: Total Help needed standing up from a chair using your arms (e.g., wheelchair or bedside chair)?: A Lot Help needed to walk in hospital room?: Total Help needed climbing 3-5 steps with a railing? : Total 6 Click Score: 9    End of Session Equipment Utilized During Treatment: Gait belt Activity Tolerance: Patient tolerated treatment well Patient left: in chair;with call bell/phone within reach;with chair alarm set Nurse Communication: Mobility status PT Visit Diagnosis: Unsteadiness on feet (R26.81);Other abnormalities of gait and mobility (R26.89);Muscle weakness (generalized) (M62.81)     Time: 6659-9357 PT Time Calculation (min) (ACUTE ONLY): 19 min  Charges:  $Therapeutic Activity: 8-22 mins                     Alben Deeds, PT DPT  Board Certified Neurologic Specialist [place] Pager [phone] Office [place] 07/22/2018, 12:48 PM

## 2018-07-23 ENCOUNTER — Other Ambulatory Visit: Payer: Self-pay

## 2018-07-23 DIAGNOSIS — Q059 Spina bifida, unspecified: Secondary | ICD-10-CM | POA: Diagnosis not present

## 2018-07-23 DIAGNOSIS — B952 Enterococcus as the cause of diseases classified elsewhere: Secondary | ICD-10-CM | POA: Diagnosis not present

## 2018-07-23 DIAGNOSIS — D333 Benign neoplasm of cranial nerves: Secondary | ICD-10-CM | POA: Diagnosis not present

## 2018-07-23 DIAGNOSIS — M1991 Primary osteoarthritis, unspecified site: Secondary | ICD-10-CM | POA: Diagnosis not present

## 2018-07-23 DIAGNOSIS — G918 Other hydrocephalus: Secondary | ICD-10-CM | POA: Diagnosis not present

## 2018-07-23 DIAGNOSIS — N184 Chronic kidney disease, stage 4 (severe): Secondary | ICD-10-CM | POA: Diagnosis not present

## 2018-07-23 DIAGNOSIS — I130 Hypertensive heart and chronic kidney disease with heart failure and stage 1 through stage 4 chronic kidney disease, or unspecified chronic kidney disease: Secondary | ICD-10-CM | POA: Diagnosis not present

## 2018-07-23 DIAGNOSIS — G960 Cerebrospinal fluid leak: Secondary | ICD-10-CM | POA: Diagnosis not present

## 2018-07-23 DIAGNOSIS — G910 Communicating hydrocephalus: Secondary | ICD-10-CM | POA: Diagnosis not present

## 2018-07-23 DIAGNOSIS — Z982 Presence of cerebrospinal fluid drainage device: Secondary | ICD-10-CM | POA: Diagnosis not present

## 2018-07-23 DIAGNOSIS — Z794 Long term (current) use of insulin: Secondary | ICD-10-CM | POA: Diagnosis not present

## 2018-07-23 DIAGNOSIS — I509 Heart failure, unspecified: Secondary | ICD-10-CM | POA: Diagnosis not present

## 2018-07-23 DIAGNOSIS — T8509XD Other mechanical complication of ventricular intracranial (communicating) shunt, subsequent encounter: Secondary | ICD-10-CM | POA: Diagnosis not present

## 2018-07-23 DIAGNOSIS — E1140 Type 2 diabetes mellitus with diabetic neuropathy, unspecified: Secondary | ICD-10-CM | POA: Diagnosis not present

## 2018-07-24 DIAGNOSIS — I10 Essential (primary) hypertension: Secondary | ICD-10-CM | POA: Diagnosis not present

## 2018-07-24 DIAGNOSIS — G970 Cerebrospinal fluid leak from spinal puncture: Secondary | ICD-10-CM | POA: Diagnosis not present

## 2018-07-24 DIAGNOSIS — N184 Chronic kidney disease, stage 4 (severe): Secondary | ICD-10-CM | POA: Diagnosis not present

## 2018-07-27 DIAGNOSIS — D333 Benign neoplasm of cranial nerves: Secondary | ICD-10-CM | POA: Diagnosis not present

## 2018-07-27 DIAGNOSIS — B952 Enterococcus as the cause of diseases classified elsewhere: Secondary | ICD-10-CM | POA: Diagnosis not present

## 2018-07-27 DIAGNOSIS — G960 Cerebrospinal fluid leak: Secondary | ICD-10-CM | POA: Diagnosis not present

## 2018-07-27 DIAGNOSIS — Z794 Long term (current) use of insulin: Secondary | ICD-10-CM | POA: Diagnosis not present

## 2018-07-27 DIAGNOSIS — Z982 Presence of cerebrospinal fluid drainage device: Secondary | ICD-10-CM | POA: Diagnosis not present

## 2018-07-27 DIAGNOSIS — T8509XD Other mechanical complication of ventricular intracranial (communicating) shunt, subsequent encounter: Secondary | ICD-10-CM | POA: Diagnosis not present

## 2018-07-27 DIAGNOSIS — M1991 Primary osteoarthritis, unspecified site: Secondary | ICD-10-CM | POA: Diagnosis not present

## 2018-07-27 DIAGNOSIS — E1140 Type 2 diabetes mellitus with diabetic neuropathy, unspecified: Secondary | ICD-10-CM | POA: Diagnosis not present

## 2018-07-27 DIAGNOSIS — Q059 Spina bifida, unspecified: Secondary | ICD-10-CM | POA: Diagnosis not present

## 2018-07-27 DIAGNOSIS — N184 Chronic kidney disease, stage 4 (severe): Secondary | ICD-10-CM | POA: Diagnosis not present

## 2018-07-27 DIAGNOSIS — G910 Communicating hydrocephalus: Secondary | ICD-10-CM | POA: Diagnosis not present

## 2018-07-27 DIAGNOSIS — I509 Heart failure, unspecified: Secondary | ICD-10-CM | POA: Diagnosis not present

## 2018-07-27 DIAGNOSIS — I130 Hypertensive heart and chronic kidney disease with heart failure and stage 1 through stage 4 chronic kidney disease, or unspecified chronic kidney disease: Secondary | ICD-10-CM | POA: Diagnosis not present

## 2018-07-30 DIAGNOSIS — G910 Communicating hydrocephalus: Secondary | ICD-10-CM | POA: Diagnosis not present

## 2018-07-30 DIAGNOSIS — Z794 Long term (current) use of insulin: Secondary | ICD-10-CM | POA: Diagnosis not present

## 2018-07-30 DIAGNOSIS — T8509XD Other mechanical complication of ventricular intracranial (communicating) shunt, subsequent encounter: Secondary | ICD-10-CM | POA: Diagnosis not present

## 2018-07-30 DIAGNOSIS — E1140 Type 2 diabetes mellitus with diabetic neuropathy, unspecified: Secondary | ICD-10-CM | POA: Diagnosis not present

## 2018-07-30 DIAGNOSIS — N184 Chronic kidney disease, stage 4 (severe): Secondary | ICD-10-CM | POA: Diagnosis not present

## 2018-07-30 DIAGNOSIS — Q059 Spina bifida, unspecified: Secondary | ICD-10-CM | POA: Diagnosis not present

## 2018-07-30 DIAGNOSIS — B952 Enterococcus as the cause of diseases classified elsewhere: Secondary | ICD-10-CM | POA: Diagnosis not present

## 2018-07-30 DIAGNOSIS — M1991 Primary osteoarthritis, unspecified site: Secondary | ICD-10-CM | POA: Diagnosis not present

## 2018-07-30 DIAGNOSIS — D333 Benign neoplasm of cranial nerves: Secondary | ICD-10-CM | POA: Diagnosis not present

## 2018-07-30 DIAGNOSIS — I509 Heart failure, unspecified: Secondary | ICD-10-CM | POA: Diagnosis not present

## 2018-07-30 DIAGNOSIS — I130 Hypertensive heart and chronic kidney disease with heart failure and stage 1 through stage 4 chronic kidney disease, or unspecified chronic kidney disease: Secondary | ICD-10-CM | POA: Diagnosis not present

## 2018-07-30 DIAGNOSIS — Z982 Presence of cerebrospinal fluid drainage device: Secondary | ICD-10-CM | POA: Diagnosis not present

## 2018-07-30 DIAGNOSIS — G960 Cerebrospinal fluid leak: Secondary | ICD-10-CM | POA: Diagnosis not present

## 2018-08-03 DIAGNOSIS — Q059 Spina bifida, unspecified: Secondary | ICD-10-CM | POA: Diagnosis not present

## 2018-08-03 DIAGNOSIS — I509 Heart failure, unspecified: Secondary | ICD-10-CM | POA: Diagnosis not present

## 2018-08-03 DIAGNOSIS — G910 Communicating hydrocephalus: Secondary | ICD-10-CM | POA: Diagnosis not present

## 2018-08-03 DIAGNOSIS — M1991 Primary osteoarthritis, unspecified site: Secondary | ICD-10-CM | POA: Diagnosis not present

## 2018-08-03 DIAGNOSIS — T8509XD Other mechanical complication of ventricular intracranial (communicating) shunt, subsequent encounter: Secondary | ICD-10-CM | POA: Diagnosis not present

## 2018-08-03 DIAGNOSIS — N184 Chronic kidney disease, stage 4 (severe): Secondary | ICD-10-CM | POA: Diagnosis not present

## 2018-08-03 DIAGNOSIS — Z794 Long term (current) use of insulin: Secondary | ICD-10-CM | POA: Diagnosis not present

## 2018-08-03 DIAGNOSIS — Z982 Presence of cerebrospinal fluid drainage device: Secondary | ICD-10-CM | POA: Diagnosis not present

## 2018-08-03 DIAGNOSIS — I130 Hypertensive heart and chronic kidney disease with heart failure and stage 1 through stage 4 chronic kidney disease, or unspecified chronic kidney disease: Secondary | ICD-10-CM | POA: Diagnosis not present

## 2018-08-03 DIAGNOSIS — E1140 Type 2 diabetes mellitus with diabetic neuropathy, unspecified: Secondary | ICD-10-CM | POA: Diagnosis not present

## 2018-08-03 DIAGNOSIS — B952 Enterococcus as the cause of diseases classified elsewhere: Secondary | ICD-10-CM | POA: Diagnosis not present

## 2018-08-03 DIAGNOSIS — D333 Benign neoplasm of cranial nerves: Secondary | ICD-10-CM | POA: Diagnosis not present

## 2018-08-03 DIAGNOSIS — G960 Cerebrospinal fluid leak: Secondary | ICD-10-CM | POA: Diagnosis not present

## 2018-08-08 DIAGNOSIS — E1140 Type 2 diabetes mellitus with diabetic neuropathy, unspecified: Secondary | ICD-10-CM | POA: Diagnosis not present

## 2018-08-08 DIAGNOSIS — G910 Communicating hydrocephalus: Secondary | ICD-10-CM | POA: Diagnosis not present

## 2018-08-08 DIAGNOSIS — Q059 Spina bifida, unspecified: Secondary | ICD-10-CM | POA: Diagnosis not present

## 2018-08-08 DIAGNOSIS — N184 Chronic kidney disease, stage 4 (severe): Secondary | ICD-10-CM | POA: Diagnosis not present

## 2018-08-08 DIAGNOSIS — T8509XD Other mechanical complication of ventricular intracranial (communicating) shunt, subsequent encounter: Secondary | ICD-10-CM | POA: Diagnosis not present

## 2018-08-08 DIAGNOSIS — I130 Hypertensive heart and chronic kidney disease with heart failure and stage 1 through stage 4 chronic kidney disease, or unspecified chronic kidney disease: Secondary | ICD-10-CM | POA: Diagnosis not present

## 2018-08-08 DIAGNOSIS — Z982 Presence of cerebrospinal fluid drainage device: Secondary | ICD-10-CM | POA: Diagnosis not present

## 2018-08-08 DIAGNOSIS — N179 Acute kidney failure, unspecified: Secondary | ICD-10-CM | POA: Diagnosis not present

## 2018-08-08 DIAGNOSIS — G960 Cerebrospinal fluid leak: Secondary | ICD-10-CM | POA: Diagnosis not present

## 2018-08-08 DIAGNOSIS — I509 Heart failure, unspecified: Secondary | ICD-10-CM | POA: Diagnosis not present

## 2018-08-08 DIAGNOSIS — I5030 Unspecified diastolic (congestive) heart failure: Secondary | ICD-10-CM | POA: Diagnosis not present

## 2018-08-08 DIAGNOSIS — M1991 Primary osteoarthritis, unspecified site: Secondary | ICD-10-CM | POA: Diagnosis not present

## 2018-08-08 DIAGNOSIS — B952 Enterococcus as the cause of diseases classified elsewhere: Secondary | ICD-10-CM | POA: Diagnosis not present

## 2018-08-08 DIAGNOSIS — D333 Benign neoplasm of cranial nerves: Secondary | ICD-10-CM | POA: Diagnosis not present

## 2018-08-08 DIAGNOSIS — Z794 Long term (current) use of insulin: Secondary | ICD-10-CM | POA: Diagnosis not present

## 2018-08-09 ENCOUNTER — Other Ambulatory Visit: Payer: Self-pay | Admitting: Physical Medicine and Rehabilitation

## 2018-08-22 ENCOUNTER — Other Ambulatory Visit: Payer: Self-pay | Admitting: Physical Medicine and Rehabilitation

## 2018-08-26 ENCOUNTER — Other Ambulatory Visit: Payer: Self-pay | Admitting: Physical Medicine and Rehabilitation

## 2018-08-29 DIAGNOSIS — Z0000 Encounter for general adult medical examination without abnormal findings: Secondary | ICD-10-CM | POA: Diagnosis not present

## 2018-09-06 DIAGNOSIS — D333 Benign neoplasm of cranial nerves: Secondary | ICD-10-CM | POA: Diagnosis not present

## 2018-09-06 DIAGNOSIS — Z1329 Encounter for screening for other suspected endocrine disorder: Secondary | ICD-10-CM | POA: Diagnosis not present

## 2018-09-07 DIAGNOSIS — I5030 Unspecified diastolic (congestive) heart failure: Secondary | ICD-10-CM | POA: Diagnosis not present

## 2018-09-07 DIAGNOSIS — N179 Acute kidney failure, unspecified: Secondary | ICD-10-CM | POA: Diagnosis not present

## 2018-09-07 DIAGNOSIS — D333 Benign neoplasm of cranial nerves: Secondary | ICD-10-CM | POA: Diagnosis not present

## 2018-09-20 ENCOUNTER — Other Ambulatory Visit: Payer: Self-pay | Admitting: Physical Medicine and Rehabilitation

## 2018-10-02 ENCOUNTER — Ambulatory Visit (INDEPENDENT_AMBULATORY_CARE_PROVIDER_SITE_OTHER): Payer: Medicare Other | Admitting: Orthopedic Surgery

## 2018-10-02 ENCOUNTER — Other Ambulatory Visit: Payer: Self-pay

## 2018-10-02 ENCOUNTER — Ambulatory Visit (INDEPENDENT_AMBULATORY_CARE_PROVIDER_SITE_OTHER): Payer: Medicare Other

## 2018-10-02 VITALS — BP 128/68 | HR 60 | Temp 98.4°F

## 2018-10-02 DIAGNOSIS — Z96651 Presence of right artificial knee joint: Secondary | ICD-10-CM | POA: Diagnosis not present

## 2018-10-02 NOTE — Progress Notes (Signed)
ANNUAL FOLLOW UP FOR right total knee Chief Complaint  Patient presents with  . Follow-up    Recheck on right total knee replacement, DOS 07-20-16.     HPI: The patient is here for the annual  follow-up x-ray for knee replacement. The patient is not complaining of pain weakness instability or stiffness in the repaired knee.  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      Examination of the RIGHT KNEE  BP 128/68   Pulse 60   Temp 98.4 F (36.9 C)   LMP  (LMP Unknown)   General the patient is normally groomed in no distress  Inspection shows : incision healed nicely without erythema, no tenderness no swelling  Range of motion total range of motion is 10-1 10 stability the knee is stable anterior to posterior as well as medial to lateral  Strength quadriceps strength mild weakness from the stroke  Skin no erythema around the skin incision  Neuro: normal sensation in the operative leg  Gait: REQUIRES MAX ASSIST AND CANE from weakness related to the stroke  Medical decision-making section  X-rays ordered, internal imaging shows (see full dictated report) stable implant with no signs of loosening, see report tibial stem buttressing tibia shaft no  cortical perforation  Diagnosis  Encounter Diagnosis  Name Primary?  . 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

## 2018-10-08 DIAGNOSIS — I5030 Unspecified diastolic (congestive) heart failure: Secondary | ICD-10-CM | POA: Diagnosis not present

## 2018-10-08 DIAGNOSIS — N179 Acute kidney failure, unspecified: Secondary | ICD-10-CM | POA: Diagnosis not present

## 2018-10-08 DIAGNOSIS — D333 Benign neoplasm of cranial nerves: Secondary | ICD-10-CM | POA: Diagnosis not present

## 2018-10-16 DIAGNOSIS — D333 Benign neoplasm of cranial nerves: Secondary | ICD-10-CM | POA: Diagnosis not present

## 2018-10-17 ENCOUNTER — Other Ambulatory Visit (HOSPITAL_COMMUNITY): Payer: Self-pay | Admitting: Neurological Surgery

## 2018-10-17 DIAGNOSIS — D333 Benign neoplasm of cranial nerves: Secondary | ICD-10-CM

## 2018-10-25 DIAGNOSIS — G910 Communicating hydrocephalus: Secondary | ICD-10-CM | POA: Diagnosis not present

## 2018-10-31 DIAGNOSIS — R42 Dizziness and giddiness: Secondary | ICD-10-CM | POA: Diagnosis not present

## 2018-10-31 DIAGNOSIS — R410 Disorientation, unspecified: Secondary | ICD-10-CM | POA: Diagnosis not present

## 2018-10-31 DIAGNOSIS — D333 Benign neoplasm of cranial nerves: Secondary | ICD-10-CM | POA: Diagnosis not present

## 2018-11-07 DIAGNOSIS — I5030 Unspecified diastolic (congestive) heart failure: Secondary | ICD-10-CM | POA: Diagnosis not present

## 2018-11-07 DIAGNOSIS — D333 Benign neoplasm of cranial nerves: Secondary | ICD-10-CM | POA: Diagnosis not present

## 2018-11-07 DIAGNOSIS — N179 Acute kidney failure, unspecified: Secondary | ICD-10-CM | POA: Diagnosis not present

## 2018-11-11 DIAGNOSIS — H2513 Age-related nuclear cataract, bilateral: Secondary | ICD-10-CM | POA: Diagnosis not present

## 2018-11-11 DIAGNOSIS — H179 Unspecified corneal scar and opacity: Secondary | ICD-10-CM | POA: Diagnosis not present

## 2018-11-11 DIAGNOSIS — H16001 Unspecified corneal ulcer, right eye: Secondary | ICD-10-CM | POA: Diagnosis not present

## 2018-11-11 DIAGNOSIS — H02153 Paralytic ectropion of right eye, unspecified eyelid: Secondary | ICD-10-CM | POA: Diagnosis not present

## 2018-11-13 DIAGNOSIS — H16001 Unspecified corneal ulcer, right eye: Secondary | ICD-10-CM | POA: Diagnosis not present

## 2018-11-13 DIAGNOSIS — H02102 Unspecified ectropion of right lower eyelid: Secondary | ICD-10-CM | POA: Diagnosis not present

## 2018-11-13 DIAGNOSIS — H02153 Paralytic ectropion of right eye, unspecified eyelid: Secondary | ICD-10-CM | POA: Diagnosis not present

## 2018-11-13 DIAGNOSIS — G510 Bell's palsy: Secondary | ICD-10-CM | POA: Diagnosis not present

## 2018-11-13 DIAGNOSIS — H02112 Cicatricial ectropion of right lower eyelid: Secondary | ICD-10-CM | POA: Diagnosis not present

## 2018-11-13 DIAGNOSIS — H1789 Other corneal scars and opacities: Secondary | ICD-10-CM | POA: Diagnosis not present

## 2018-11-13 DIAGNOSIS — H2513 Age-related nuclear cataract, bilateral: Secondary | ICD-10-CM | POA: Diagnosis not present

## 2018-11-13 DIAGNOSIS — H16211 Exposure keratoconjunctivitis, right eye: Secondary | ICD-10-CM | POA: Diagnosis not present

## 2018-11-13 DIAGNOSIS — H02132 Senile ectropion of right lower eyelid: Secondary | ICD-10-CM | POA: Diagnosis not present

## 2018-11-14 DIAGNOSIS — G910 Communicating hydrocephalus: Secondary | ICD-10-CM | POA: Diagnosis not present

## 2018-11-25 DIAGNOSIS — H02153 Paralytic ectropion of right eye, unspecified eyelid: Secondary | ICD-10-CM | POA: Diagnosis not present

## 2018-11-25 DIAGNOSIS — H16001 Unspecified corneal ulcer, right eye: Secondary | ICD-10-CM | POA: Diagnosis not present

## 2018-11-25 DIAGNOSIS — H179 Unspecified corneal scar and opacity: Secondary | ICD-10-CM | POA: Diagnosis not present

## 2018-11-25 DIAGNOSIS — H16211 Exposure keratoconjunctivitis, right eye: Secondary | ICD-10-CM | POA: Diagnosis not present

## 2018-11-25 DIAGNOSIS — H2513 Age-related nuclear cataract, bilateral: Secondary | ICD-10-CM | POA: Diagnosis not present

## 2018-11-29 DIAGNOSIS — D333 Benign neoplasm of cranial nerves: Secondary | ICD-10-CM | POA: Diagnosis not present

## 2018-11-29 DIAGNOSIS — T85618A Breakdown (mechanical) of other specified internal prosthetic devices, implants and grafts, initial encounter: Secondary | ICD-10-CM | POA: Diagnosis not present

## 2018-12-04 DIAGNOSIS — E782 Mixed hyperlipidemia: Secondary | ICD-10-CM | POA: Diagnosis not present

## 2018-12-04 DIAGNOSIS — N184 Chronic kidney disease, stage 4 (severe): Secondary | ICD-10-CM | POA: Diagnosis not present

## 2018-12-04 DIAGNOSIS — E1129 Type 2 diabetes mellitus with other diabetic kidney complication: Secondary | ICD-10-CM | POA: Diagnosis not present

## 2018-12-04 DIAGNOSIS — E1169 Type 2 diabetes mellitus with other specified complication: Secondary | ICD-10-CM | POA: Diagnosis not present

## 2018-12-04 DIAGNOSIS — N183 Chronic kidney disease, stage 3 (moderate): Secondary | ICD-10-CM | POA: Diagnosis not present

## 2018-12-06 DIAGNOSIS — E1129 Type 2 diabetes mellitus with other diabetic kidney complication: Secondary | ICD-10-CM | POA: Diagnosis not present

## 2018-12-06 DIAGNOSIS — E1169 Type 2 diabetes mellitus with other specified complication: Secondary | ICD-10-CM | POA: Diagnosis not present

## 2018-12-06 DIAGNOSIS — N184 Chronic kidney disease, stage 4 (severe): Secondary | ICD-10-CM | POA: Diagnosis not present

## 2018-12-06 DIAGNOSIS — E782 Mixed hyperlipidemia: Secondary | ICD-10-CM | POA: Diagnosis not present

## 2018-12-06 DIAGNOSIS — I129 Hypertensive chronic kidney disease with stage 1 through stage 4 chronic kidney disease, or unspecified chronic kidney disease: Secondary | ICD-10-CM | POA: Diagnosis not present

## 2018-12-06 DIAGNOSIS — Z136 Encounter for screening for cardiovascular disorders: Secondary | ICD-10-CM | POA: Diagnosis not present

## 2018-12-06 DIAGNOSIS — E1140 Type 2 diabetes mellitus with diabetic neuropathy, unspecified: Secondary | ICD-10-CM | POA: Diagnosis not present

## 2018-12-06 DIAGNOSIS — E1122 Type 2 diabetes mellitus with diabetic chronic kidney disease: Secondary | ICD-10-CM | POA: Diagnosis not present

## 2018-12-08 DIAGNOSIS — I5030 Unspecified diastolic (congestive) heart failure: Secondary | ICD-10-CM | POA: Diagnosis not present

## 2018-12-08 DIAGNOSIS — D333 Benign neoplasm of cranial nerves: Secondary | ICD-10-CM | POA: Diagnosis not present

## 2018-12-08 DIAGNOSIS — N179 Acute kidney failure, unspecified: Secondary | ICD-10-CM | POA: Diagnosis not present

## 2018-12-13 ENCOUNTER — Encounter (HOSPITAL_COMMUNITY): Payer: Self-pay

## 2018-12-13 ENCOUNTER — Emergency Department (HOSPITAL_COMMUNITY)
Admission: EM | Admit: 2018-12-13 | Discharge: 2019-01-09 | Disposition: E | Payer: Medicare Other | Attending: Emergency Medicine | Admitting: Emergency Medicine

## 2018-12-13 ENCOUNTER — Emergency Department (HOSPITAL_COMMUNITY): Payer: Medicare Other

## 2018-12-13 DIAGNOSIS — E1140 Type 2 diabetes mellitus with diabetic neuropathy, unspecified: Secondary | ICD-10-CM | POA: Insufficient documentation

## 2018-12-13 DIAGNOSIS — N189 Chronic kidney disease, unspecified: Secondary | ICD-10-CM | POA: Diagnosis not present

## 2018-12-13 DIAGNOSIS — J984 Other disorders of lung: Secondary | ICD-10-CM | POA: Diagnosis not present

## 2018-12-13 DIAGNOSIS — R9431 Abnormal electrocardiogram [ECG] [EKG]: Secondary | ICD-10-CM | POA: Diagnosis not present

## 2018-12-13 DIAGNOSIS — Z20828 Contact with and (suspected) exposure to other viral communicable diseases: Secondary | ICD-10-CM | POA: Insufficient documentation

## 2018-12-13 DIAGNOSIS — I130 Hypertensive heart and chronic kidney disease with heart failure and stage 1 through stage 4 chronic kidney disease, or unspecified chronic kidney disease: Secondary | ICD-10-CM | POA: Insufficient documentation

## 2018-12-13 DIAGNOSIS — Q760 Spina bifida occulta: Secondary | ICD-10-CM | POA: Insufficient documentation

## 2018-12-13 DIAGNOSIS — I469 Cardiac arrest, cause unspecified: Secondary | ICD-10-CM | POA: Diagnosis not present

## 2018-12-13 DIAGNOSIS — I499 Cardiac arrhythmia, unspecified: Secondary | ICD-10-CM | POA: Diagnosis not present

## 2018-12-13 DIAGNOSIS — R000 Tachycardia, unspecified: Secondary | ICD-10-CM | POA: Diagnosis not present

## 2018-12-13 DIAGNOSIS — E1122 Type 2 diabetes mellitus with diabetic chronic kidney disease: Secondary | ICD-10-CM | POA: Diagnosis not present

## 2018-12-13 DIAGNOSIS — Z794 Long term (current) use of insulin: Secondary | ICD-10-CM | POA: Diagnosis not present

## 2018-12-13 DIAGNOSIS — Z79899 Other long term (current) drug therapy: Secondary | ICD-10-CM | POA: Diagnosis not present

## 2018-12-13 DIAGNOSIS — R0689 Other abnormalities of breathing: Secondary | ICD-10-CM | POA: Diagnosis not present

## 2018-12-13 DIAGNOSIS — R069 Unspecified abnormalities of breathing: Secondary | ICD-10-CM | POA: Diagnosis not present

## 2018-12-13 DIAGNOSIS — I509 Heart failure, unspecified: Secondary | ICD-10-CM | POA: Diagnosis not present

## 2018-12-13 DIAGNOSIS — R404 Transient alteration of awareness: Secondary | ICD-10-CM | POA: Diagnosis not present

## 2018-12-13 DIAGNOSIS — Z03818 Encounter for observation for suspected exposure to other biological agents ruled out: Secondary | ICD-10-CM | POA: Diagnosis not present

## 2018-12-13 DIAGNOSIS — R55 Syncope and collapse: Secondary | ICD-10-CM | POA: Diagnosis present

## 2018-12-13 LAB — BLOOD GAS, ARTERIAL
Acid-base deficit: 17.6 mmol/L — ABNORMAL HIGH (ref 0.0–2.0)
Bicarbonate: 10 mmol/L — ABNORMAL LOW (ref 20.0–28.0)
FIO2: 100
O2 Saturation: 80.9 %
Patient temperature: 37.6
pCO2 arterial: 50.1 mmHg — ABNORMAL HIGH (ref 32.0–48.0)
pH, Arterial: 6.994 — CL (ref 7.350–7.450)
pO2, Arterial: 67.3 mmHg — ABNORMAL LOW (ref 83.0–108.0)

## 2018-12-13 LAB — COMPREHENSIVE METABOLIC PANEL
ALT: 1537 U/L — ABNORMAL HIGH (ref 0–44)
AST: 1721 U/L — ABNORMAL HIGH (ref 15–41)
Albumin: 2.7 g/dL — ABNORMAL LOW (ref 3.5–5.0)
Alkaline Phosphatase: 87 U/L (ref 38–126)
BUN: 102 mg/dL — ABNORMAL HIGH (ref 8–23)
CO2: 14 mmol/L — ABNORMAL LOW (ref 22–32)
Calcium: 9.8 mg/dL (ref 8.9–10.3)
Chloride: 110 mmol/L (ref 98–111)
Creatinine, Ser: 6.04 mg/dL — ABNORMAL HIGH (ref 0.44–1.00)
GFR calc Af Amer: 8 mL/min — ABNORMAL LOW (ref >60)
GFR calc non Af Amer: 6 mL/min — ABNORMAL LOW (ref >60)
Glucose, Bld: 207 mg/dL — ABNORMAL HIGH (ref 70–99)
Potassium: 5.5 mmol/L — ABNORMAL HIGH (ref 3.5–5.1)
Sodium: 148 mmol/L — ABNORMAL HIGH (ref 135–145)
Total Bilirubin: 1.2 mg/dL (ref 0.3–1.2)
Total Protein: 6.1 g/dL — ABNORMAL LOW (ref 6.5–8.1)

## 2018-12-13 LAB — CBC WITH DIFFERENTIAL/PLATELET
Abs Immature Granulocytes: 0.25 10*3/uL — ABNORMAL HIGH (ref 0.00–0.07)
Basophils Absolute: 0.1 10*3/uL (ref 0.0–0.1)
Basophils Relative: 1 %
Eosinophils Absolute: 0 10*3/uL (ref 0.0–0.5)
Eosinophils Relative: 0 %
HCT: 44.5 % (ref 36.0–46.0)
Hemoglobin: 12.5 g/dL (ref 12.0–15.0)
Immature Granulocytes: 2 %
Lymphocytes Relative: 23 %
Lymphs Abs: 3.2 10*3/uL (ref 0.7–4.0)
MCH: 28.9 pg (ref 26.0–34.0)
MCHC: 28.1 g/dL — ABNORMAL LOW (ref 30.0–36.0)
MCV: 102.8 fL — ABNORMAL HIGH (ref 80.0–100.0)
Monocytes Absolute: 0.7 10*3/uL (ref 0.1–1.0)
Monocytes Relative: 5 %
Neutro Abs: 9.9 10*3/uL — ABNORMAL HIGH (ref 1.7–7.7)
Neutrophils Relative %: 69 %
Platelets: 132 10*3/uL — ABNORMAL LOW (ref 150–400)
RBC: 4.33 MIL/uL (ref 3.87–5.11)
RDW: 13.6 % (ref 11.5–15.5)
WBC: 14.1 10*3/uL — ABNORMAL HIGH (ref 4.0–10.5)
nRBC: 0.1 % (ref 0.0–0.2)

## 2018-12-13 LAB — RAPID URINE DRUG SCREEN, HOSP PERFORMED
Amphetamines: NOT DETECTED
Barbiturates: NOT DETECTED
Benzodiazepines: NOT DETECTED
Cocaine: NOT DETECTED
Opiates: NOT DETECTED
Tetrahydrocannabinol: NOT DETECTED

## 2018-12-13 LAB — BRAIN NATRIURETIC PEPTIDE: B Natriuretic Peptide: 233 pg/mL — ABNORMAL HIGH (ref 0.0–100.0)

## 2018-12-13 LAB — SARS CORONAVIRUS 2 BY RT PCR (HOSPITAL ORDER, PERFORMED IN ~~LOC~~ HOSPITAL LAB): SARS Coronavirus 2: NEGATIVE

## 2018-12-13 LAB — URINALYSIS, ROUTINE W REFLEX MICROSCOPIC
Bacteria, UA: NONE SEEN
Bilirubin Urine: NEGATIVE
Glucose, UA: NEGATIVE mg/dL
Ketones, ur: NEGATIVE mg/dL
Nitrite: NEGATIVE
Protein, ur: 100 mg/dL — AB
Specific Gravity, Urine: 1.013 (ref 1.005–1.030)
WBC, UA: >50 WBC/hpf — ABNORMAL HIGH (ref 0–5)
pH: 5 (ref 5.0–8.0)

## 2018-12-13 LAB — LACTIC ACID, PLASMA: Lactic Acid, Venous: >11 mmol/L (ref 0.5–1.9)

## 2018-12-13 LAB — TROPONIN I (HIGH SENSITIVITY): Troponin I (High Sensitivity): 131 ng/L (ref <18)

## 2018-12-13 LAB — PROTIME-INR
INR: 1.5 — ABNORMAL HIGH (ref 0.8–1.2)
Prothrombin Time: 17.7 seconds — ABNORMAL HIGH (ref 11.4–15.2)

## 2018-12-13 LAB — ETHANOL: Alcohol, Ethyl (B): <10 mg/dL (ref <10)

## 2018-12-13 MED ORDER — SODIUM CHLORIDE 0.9 % IV BOLUS
1000.0000 mL | Freq: Once | INTRAVENOUS | Status: AC
  Administered 2018-12-13: 12:00:00 1000 mL via INTRAVENOUS

## 2018-12-14 LAB — URINE CULTURE: Culture: >=100000 — AB

## 2018-12-15 ENCOUNTER — Telehealth: Payer: Self-pay

## 2018-12-23 DIAGNOSIS — E1129 Type 2 diabetes mellitus with other diabetic kidney complication: Secondary | ICD-10-CM | POA: Diagnosis not present

## 2018-12-23 DIAGNOSIS — E1169 Type 2 diabetes mellitus with other specified complication: Secondary | ICD-10-CM | POA: Diagnosis not present

## 2018-12-23 DIAGNOSIS — N184 Chronic kidney disease, stage 4 (severe): Secondary | ICD-10-CM | POA: Diagnosis not present

## 2018-12-23 DIAGNOSIS — E782 Mixed hyperlipidemia: Secondary | ICD-10-CM | POA: Diagnosis not present

## 2018-12-23 DIAGNOSIS — N183 Chronic kidney disease, stage 3 (moderate): Secondary | ICD-10-CM | POA: Diagnosis not present

## 2019-01-08 DIAGNOSIS — I5030 Unspecified diastolic (congestive) heart failure: Secondary | ICD-10-CM | POA: Diagnosis not present

## 2019-01-08 DIAGNOSIS — N179 Acute kidney failure, unspecified: Secondary | ICD-10-CM | POA: Diagnosis not present

## 2019-01-08 DIAGNOSIS — D333 Benign neoplasm of cranial nerves: Secondary | ICD-10-CM | POA: Diagnosis not present

## 2019-01-09 NOTE — ED Provider Notes (Signed)
[facility] EMERGENCY DEPARTMENT Provider Note   CSN: [id number] Arrival date & time: 12/30/18  1031     History   Chief Complaint Chief Complaint  Patient presents with  . unrepsponsive    HPI Natalie Friedman is a 68 y.o. female.     The history is provided by the EMS personnel and a relative. The history is limited by the condition of the patient (acuity of condition).  Pt was seen at 1035. Per EMS and family report:  Husband woke up with pt "gurgling on mucus" and called EMS at [phone]. Pt was unresponsive, apneic and pulseless on Fire Dept arrival to scene at [phone]. EMS arrived at 0941 and noted pt unresponsive, apneic, pulseless with asystole on monitor. King airway placed, pt given IV epi x4 with monitor changing to VT. Pt was defib x1 with monitor changing back to NSR/return of pulses at 1016. Pt continued unresponsive and apneic en route.   1115 Update:  Husband in ED now: states he heard pt "gurgling on mucus" and when he cleared her mouth she was apneic. Unknown for how long. Husband states he started CPR until Fire/EMS arrived. Pt's husband states she has been in a slow decline over the past 2 weeks, especially over the past 2 days when "he's had a really hard time waking her up." He states "she wouldn't have wanted all this." Husband requested DNR.     Past Medical History:  Diagnosis Date  . Anxiety   . Arthritis    knee, back  . CHF (congestive heart failure) (HCC)    was a result of Cotiorsteroids  . Chronic kidney disease    stage IV  . Depression   . Diabetes mellitus without complication ([place])    Type II  . History of blood transfusion    ecotic pregnacy  . Hypertension   . Neuropathy    feet  . Occult spina bifida   . SVD (spontaneous vaginal delivery)    x 2  . Wears glasses   . Wears partial dentures    upper    Patient Active Problem List   Diagnosis Date Noted  . 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  Suture reaction 05/10/2018  . Acute on chronic renal failure ([place])   . Hypertension   . Diabetes mellitus type 2 in obese ([place])   . CKD (chronic kidney disease)   . 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 Hypoalbuminemia due to protein-calorie malnutrition ([place])   . Essential hypertension   . Acute blood loss anemia   . Vestibular dysfunction of right ear   . 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 Brain tumor ([place]) 04/16/2018  . Cellulitis and abscess of right leg   . 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 Primary osteoarthritis of right knee   . Nephrotic syndrome 05/15/2016  . CHF (congestive heart failure) ([place]) 05/12/2016  . DM (diabetes mellitus), type 2 with renal complications ([place]) [id number]  . Hypertensive urgency 05/12/2016  . anasarca 05/12/2016  . Immobility 05/12/2016  . Proteinuria 05/12/2016  . Uncontrolled hypertension 08/19/2015    Past Surgical History:  Procedure Laterality Date  . APPLICATION OF CRANIAL NAVIGATION N/A 04/16/2018   Procedure: APPLICATION OF CRANIAL NAVIGATION;  Surgeon: Judith Part, MD;  Location: [facility];  Service: Neurosurgery;  Laterality: N/A;  . CRANIOTOMY Right 04/16/2018   Procedure: Right craniotomy for tumor resection with brainlab;  Surgeon: Zada Finders,  Joyice Faster, MD;  Location: [facility];  Service: Neurosurgery;  Laterality: Right;  right  . ectopic    . INCISION AND DRAINAGE Right 10/04/2016   Procedure: INCISION AND DRAINAGE AND ASPIRATION; Knee. Surgeon: Carole Civil, MD;  Location: [facility];  Service: Orthopedics;  Laterality: Right;  right knee wound  . patella fx Right 07/2015  . SHUNT REVISION Left 06/26/2018   Procedure: Left ventriculoperitoneal shunt revision;  Surgeon: Judith Part, MD;  Location: [facility];  Service: Neurosurgery;  Laterality: Left;  Left ventriculoperitoneal shunt revision  . SHUNT REVISION Left 07/10/2018   Procedure: Left ventricular-peritoneal shunt  revision;  Surgeon: Judith Part, MD;  Location: [facility];  Service: Neurosurgery;  Laterality: Left;  left  . TOTAL KNEE ARTHROPLASTY Right 07/20/2016   Procedure: TOTAL KNEE ARTHROPLASTY;  Surgeon: Carole Civil, MD;  Location: [facility];  Service: Orthopedics;  Laterality: Right;  . VENTRICULOPERITONEAL SHUNT Left 06/10/2018   Procedure: SHUNT INSERTION VENTRICULAR-PERITONEAL;  Surgeon: Judith Part, MD;  Location: [facility];  Service: Neurosurgery;  Laterality: Left;  . WISDOM TOOTH EXTRACTION       OB History   No obstetric history on file.      Home Medications    Prior to Admission medications   Medication Sig Start Date End Date Taking? Authorizing Provider  amLODipine (NORVASC) 5 MG tablet Take 2 tablets (10 mg total) by mouth at bedtime. Patient taking differently: Take 5 mg by mouth every evening.  05/10/18   Love, Ivan Anchors, PA-C  carvedilol (COREG) 25 MG tablet Take 1 tablet (25 mg total) by mouth 2 (two) times daily with a meal. Patient taking differently: Take 12.5 mg by mouth daily.  05/10/18   Love, Ivan Anchors, PA-C  Cholecalciferol (VITAMIN D) 50 MCG (2000 UT) CAPS Take 4,000 Units by mouth daily.    [provider]  feeding supplement, ENSURE ENLIVE, (ENSURE ENLIVE) LIQD Take 237 mLs by mouth 2 (two) times daily between meals. Patient not taking: Reported on 10/02/2018 07/22/18   Judith Part, MD  glimepiride (AMARYL) 1 MG tablet Take 1 tablet (1 mg total) by mouth daily with breakfast. Patient not taking: Reported on 10/02/2018 05/10/18   Love, Ivan Anchors, PA-C  LEVEMIR FLEXTOUCH 100 UNIT/ML Pen Inject 10 Units into the skin every evening. 07/05/18   [provider]  mirtazapine (REMERON) 15 MG tablet Take 7.5 mg by mouth at bedtime. 06/19/18   [provider]  naphazoline-glycerin (CLEAR EYES REDNESS) 0.012-0.2 % SOLN Place 1 drop into the right eye 4 (four) times daily. 05/02/18   Love, Ivan Anchors, PA-C  olmesartan (BENICAR) 20 MG tablet  Take 1 tablet (20 mg total) by mouth daily. Patient not taking: Reported on 10/02/2018 05/10/18   Love, Ivan Anchors, PA-C  vitamin B-12 (CYANOCOBALAMIN) 500 MCG tablet Take 500 mcg by mouth daily.    [provider]    Family History Family History  Problem Relation Age of Onset  . Cancer Mother        colon  . Cancer - Colon Father     Social History Social History   Tobacco Use  . Smoking status: Never Smoker  . Smokeless tobacco: Never Used  Substance Use Topics  . Alcohol use: No  . Drug use: No     Allergies   Corticosteroids, Gluten meal, and Oxycodone   Review of Systems Review of Systems  Unable to perform ROS: Acuity of condition     Physical Exam  Updated Vital Signs BP (!) 69/31   Pulse (!) 54   Resp 18   Wt 94.6 kg   LMP  (LMP Unknown)   SpO2 94%   BMI 34.71 kg/m    Patient Vitals for the past 24 hrs:  BP Pulse Resp SpO2 Weight  04-Jan-2019 1045 - (!) 54 18 94 % -  01/04/19 1044 - - - - 94.6 kg  01-04-19 1043 (!) 69/31 (!) 56 16 (!) 86 % -  2019/01/04 1043 - - - (!) 77 % -     Physical Exam 1040: Physical examination: Vital signs and O2 SAT: Reviewed; Constitutional: Well developed, Well nourished, Well hydrated, Unresponsive; Head and Face: Normocephalic, Atraumatic; Eyes: Pupils 38mm bilat, NR.; ENMT: Intubated, Mucous membranes moist; Neck: No lymphadenopathy, No crepitus, Trachea midline; Cardiovascular: Heart sounds present, regular rate/rhythm, central pulses present; Respiratory: Breath sounds coarse & equal bilaterally, Apnea, Bag-valve-tube ventilated; Chest: No deformity, Movement normal, No crepitus; Abdomen: Soft, Nondistended; Extremities: No deformity, No edema; Neuro: Unresponsive, GCS 3. +right eyelid taped closed.; Skin: Color normal, Dry, Cool   ED Treatments / Results  Labs (all labs ordered are listed, but only abnormal results are displayed)   EKG EKG Interpretation  Date/Time:  01-04-2019 10:39:00 EDT  #1  Ventricular Rate:  70 PR Interval:    QRS Duration: 112 QT Interval:  462 QTC Calculation: 499 R Axis:   -84 Text Interpretation:  Sinus rhythm Prolonged PR interval Left anterior fascicular block Abnormal R-wave progression, late transition Repol abnrm suggests ischemia, anterolateral Baseline wander Artifact suggest repeat tracing Confirmed by Francine Graven [phone]) on 01/04/19 10:54:12 AM    EKG Interpretation  Date/Time:  01/04/19 10:41:06 EDT  #2 Ventricular Rate:  74 PR Interval:    QRS Duration: 113 QT Interval:  441 QTC Calculation: 490 R Axis:   -86 Text Interpretation:  Sinus rhythm Prolonged PR interval Left anterior fascicular block Abnormal R-wave progression, late transition Nonspecific repol abnormality, diffuse leads When compared with ECG of 04/12/2018 Nonspecific ST and T wave abnormality is now Present Confirmed by Francine Graven [phone]) on 01-04-2019 10:55:39 AM           Radiology   Procedures Procedures (including critical care time)  Medications Ordered in ED Medications - No data to display   Initial Impression / Assessment and Plan / ED Course  I have reviewed the triage vital signs and the nursing notes.  Pertinent labs & imaging results that were available during my care of the patient were reviewed by me and considered in my medical decision making (see chart for details).     MDM Reviewed: previous chart, nursing note and vitals Reviewed previous: labs and ECG Interpretation: labs, ECG and x-ray Total time providing critical care: 30-74 minutes. This excludes time spent performing separately reportable procedures and services. Consults: critical care   CRITICAL CARE Performed by: Francine Graven Total critical care time: 45 minutes Critical care time was exclusive of separately billable procedures and treating other patients. Critical care was necessary to treat or prevent imminent or life-threatening  deterioration. Critical care was time spent personally by me on the following activities: development of treatment plan with patient and/or surrogate as well as nursing, discussions with consultants, evaluation of patient's response to treatment, examination of patient, obtaining history from patient or surrogate, ordering and performing treatments and interventions, ordering and review of laboratory studies, ordering and review of radiographic studies, pulse oximetry and re-evaluation of patient's condition.  ELLEANA STILLSON was evaluated in Emergency Department on 01-06-2019 for the symptoms described in the history of present illness. She was evaluated in the context of the global COVID-19 pandemic, which necessitated consideration that the patient might be at risk for infection with the SARS-CoV-2 virus that causes COVID-19. Institutional protocols and algorithms that pertain to the evaluation of patients at risk for COVID-19 are in a state of rapid change based on information released by regulatory bodies including the CDC and federal and state organizations. These policies and algorithms were followed during the patient's care in the ED.    1045:  Pt arrived unresponsive, apneic, +central pulses present with NSR/sinus bradycardia on monitor. Pt ventilated well through [facility] airway with Sats 94-96%, lungs with equal rise/fall; will wait for COVID testing to change out to ETT. BP soft; IVF bolus ordered. Will need to have discussion with husband regarding goals of care. Workup placed.   1115:  Pt's husband in ED (see update above): requested DNR if pt loses pulses again.   1122:  Pt with loss of pulses, monitor asystole. Pt remains apneic, unresponsive. TOD 1122. Husband to bedside.  1135:  T/C returned from PMD Dr. Juel Burrow office , case discussed, including:  HPI, pertinent PM/SHx, VS/PE, dx testing, ED course and treatment:  Agreeable to sign death certificate.       Final Clinical  Impressions(s) / ED Diagnoses   Final diagnoses:  None    ED Discharge Orders    None       Francine Graven, DO 12/16/18 1706

## 2019-01-09 NOTE — Progress Notes (Signed)
Pt in unresponsive from EMS with king airway in place.  Pt ventilates easy and per MD leaving king airway in at this time until covid result back.  Pt placed on vent.  RT will continue to monitor.

## 2019-01-09 NOTE — ED Triage Notes (Signed)
Pt was  Brought from home. Husband woke up to her gurgling. asytole upon arrival at [phone] by fire dept, then EMS arrived at [phone] . Pt given 4 rounds of epi , then went into Vtach, shock administered then back in sinus rhythm. Has 4 king in place and I/O in left leg

## 2019-01-09 NOTE — ED Notes (Signed)
Pt husbands has requested that he bathe her body for the last time. Supplies provided

## 2019-01-09 NOTE — ED Notes (Signed)
Date and time results received: 12/18/18 1146 (use smartphrase ".now" to insert current time)  Test: troponin Critical Value: 131  Name of Provider Notified: [facility]  Orders Received? Or Actions Taken?:

## 2019-01-09 NOTE — ED Notes (Signed)
Date and time results received: 2018/12/28 1151 (use smartphrase ".now" to insert current time)  Test: lactic acid Critical Value: >11  Name of Provider Notified: Thurnell Garbe  Orders Received? Or Actions Taken?: none

## 2019-01-09 NOTE — ED Notes (Addendum)
Pt aystole at this time. CPR initiated . EDP notified

## 2019-01-09 NOTE — ED Notes (Addendum)
CPR ceasedTime of death 1122am. EDP at bedside. Husband at bedside

## 2019-01-09 NOTE — ED Notes (Addendum)
Husband requesting to take body home. Pt wishes to be [facility] at [facility] in which she will be buried in a shroud as opposed to traditional burial. Husband and son plan to take pt home in [place] and place her on ice and have a vigual for several days for people to visit. Notified AC and he is calling Health Dept. Charge nurse called patient placement then was advised to call medical examiner

## 2019-01-09 DEATH — deceased

## 2019-07-05 IMAGING — CT CT HEAD W/O CM
3 of 4 series · 15 of 47 positions shown, 18 images · non-contrast
Comparison: 04/15/2018 CT of the brain

04/16/2018 MRI of the brain

CLINICAL DATA: Status post right cerebellopontine angle lesion
resection with pseudomeningocele. Ventricular assessment before
lumbar drain placement.

EXAM:
CT HEAD WITHOUT CONTRAST
TECHNIQUE: Contiguous axial images were obtained from the base of the skull
through the vertex without intravenous contrast.

[Series 4: head 2.0 h70h · axial · 0.40mm/px · z∈[-114,+18]mm · 9 of 84 slices shown, 12 images]
[im 9/84  brain]
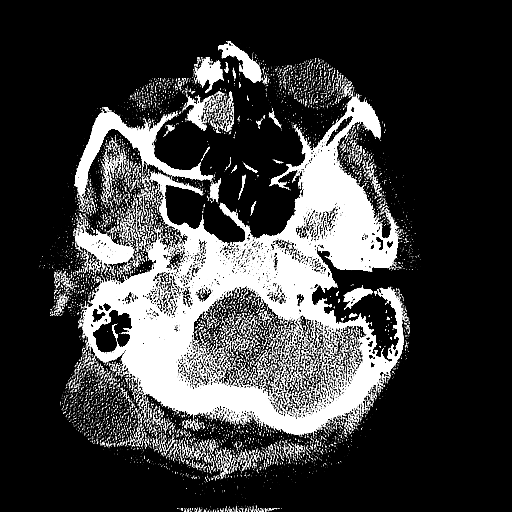
[im 9/84  bone]
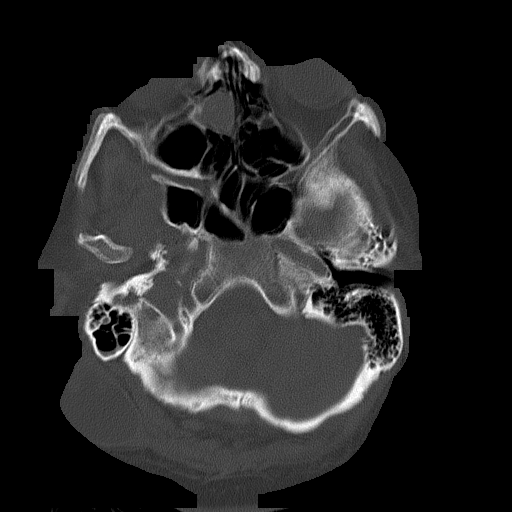
[im 17/84  brain]
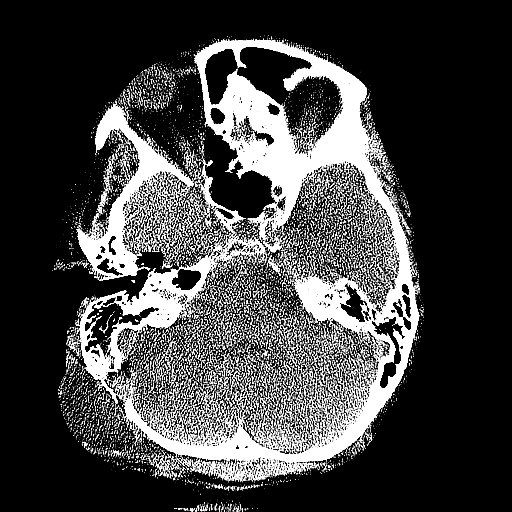
[im 25/84  brain]
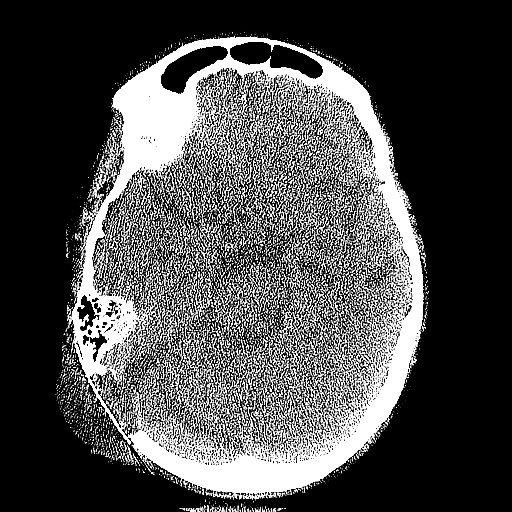
[im 34/84  brain]
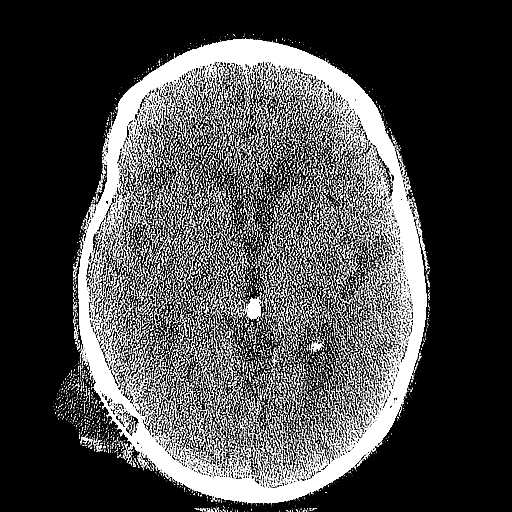
[im 42/84  brain]
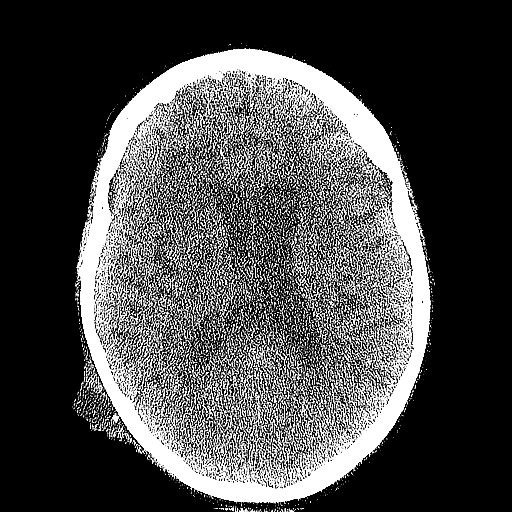
[im 42/84  bone]
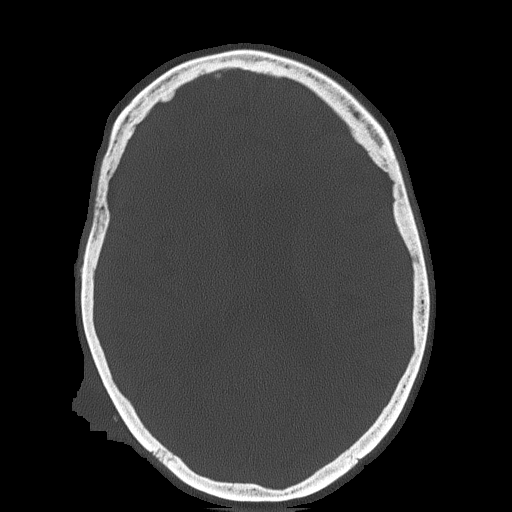
[im 50/84  brain]
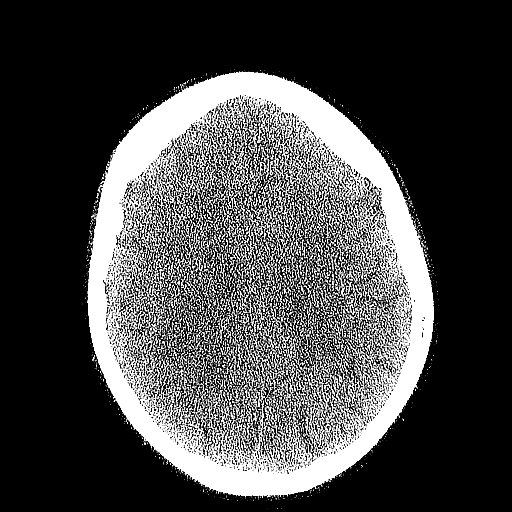
[im 59/84  brain]
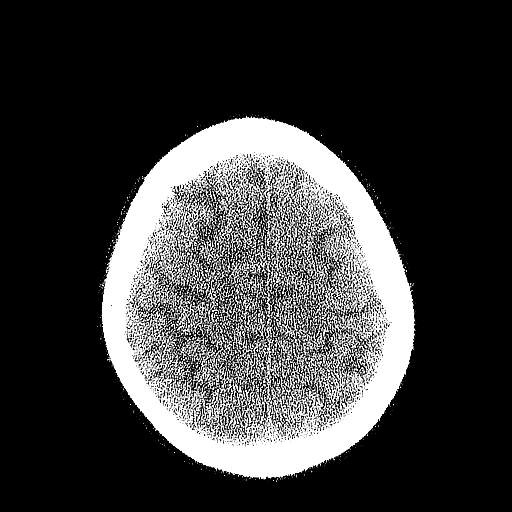
[im 67/84  brain]
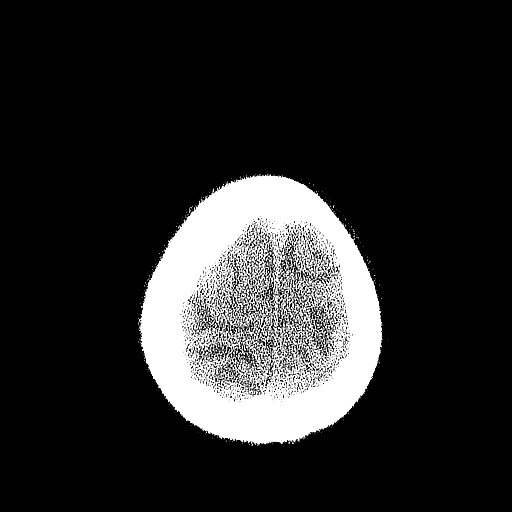
[im 75/84  brain]
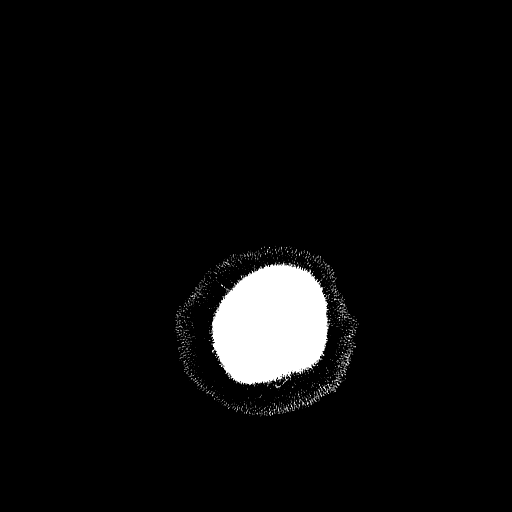
[im 75/84  bone]
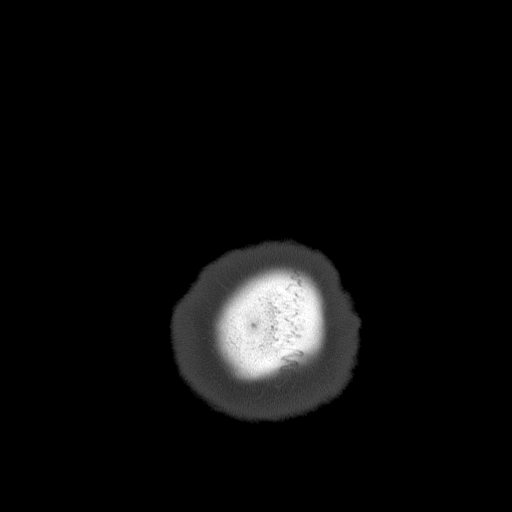

[Series 5: head 3.0 mpr cor · coronal · 0.33mm/px · 3 of 67 slices shown]
[im 23/67  brain]
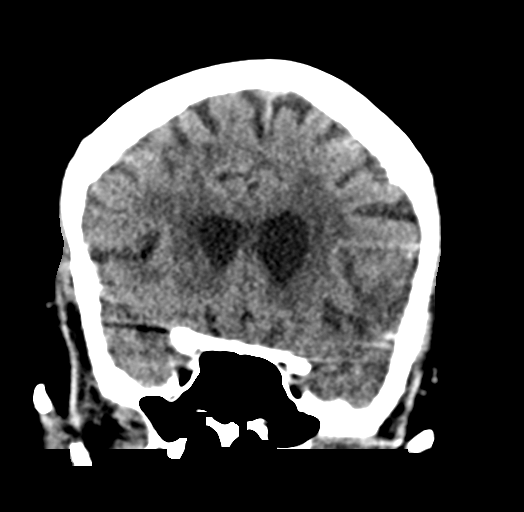
[im 30/67  brain]
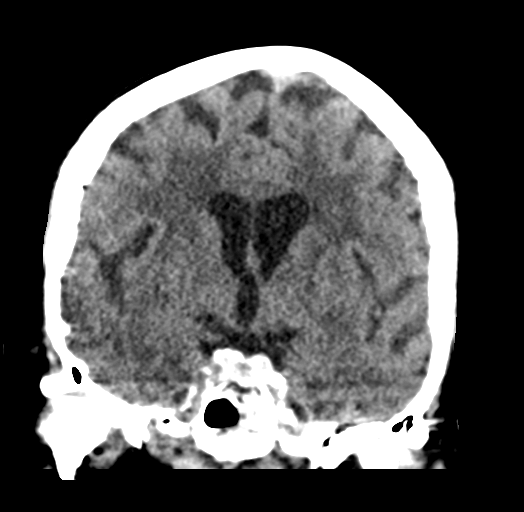
[im 37/67  brain]
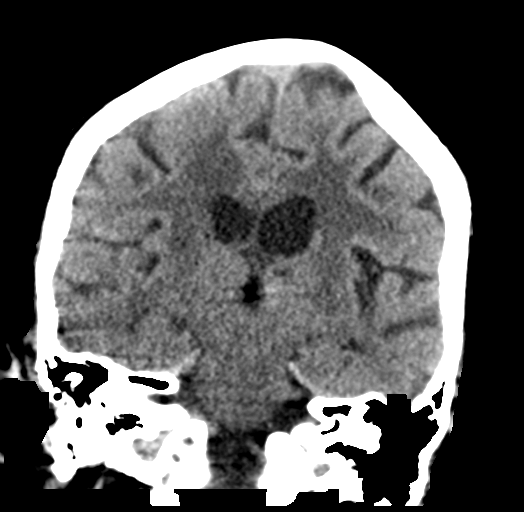

[Series 6: head 3.0 mpr sag · sagittal · 0.33mm/px · 3 of 62 slices shown]
[im 21/62  brain]
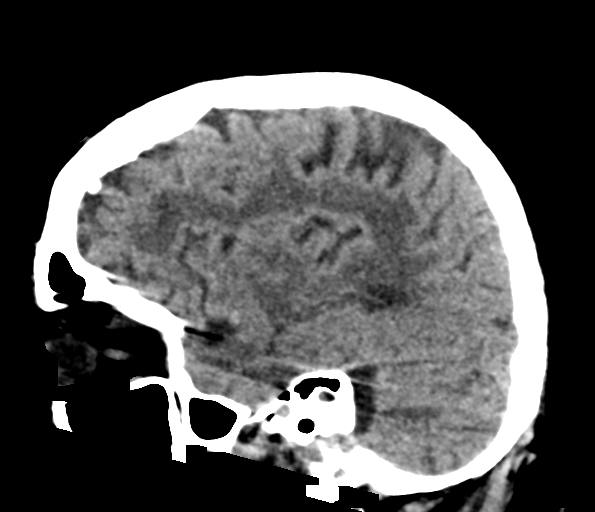
[im 31/62  brain]
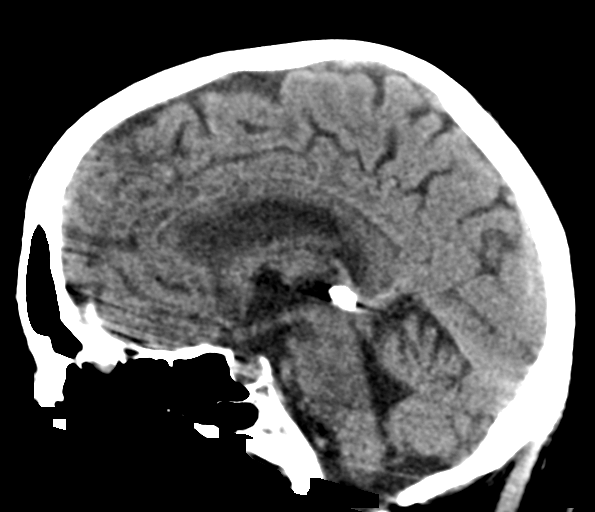
[im 41/62  brain]
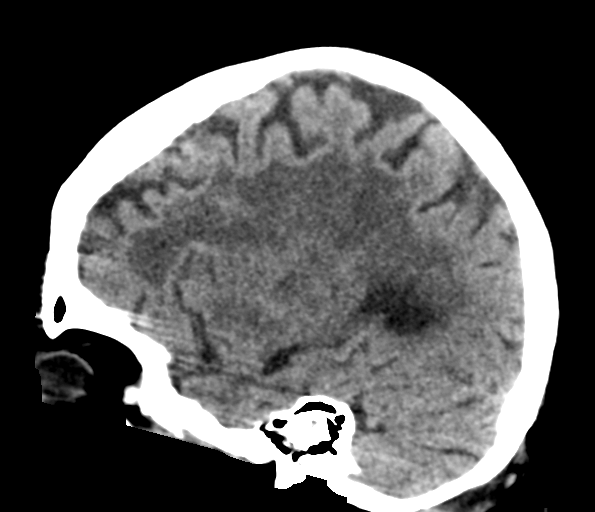

[15 of 47 positions shown; findings below may reference images not displayed]

FINDINGS: Brain: Postsurgical changes in the right posterior fossa with
hypoattenuation of the right middle cerebellar peduncle, consistent
with subacute ischemia. There is diffuse hypoattenuation of the
white matter consistent chronic ischemic microangiopathy. The size
and configuration of the ventricles are unchanged compared to the
preoperative scan of 04/15/2018.

Vascular: No abnormal hyperdensity of the major intracranial
arteries or dural venous sinuses. No intracranial atherosclerosis.

Skull: Right retromastoid craniotomy with cranioplasty mesh. The
pseudomeningocele has increased in size, now measuring 18 x 49 mm.

Sinuses/Orbits: Minimal right mastoid opacification. The paranasal
sinuses are clear. The orbits are normal.
IMPRESSION: 1. Increased size of right retromastoid craniotomy
pseudomeningocele, now measuring 18 x 49 mm.
2. Unchanged size and configuration of the ventricles compared to
the preoperative scan.
3. Hypoattenuation of the right middle cerebellar peduncle,
consistent with subacute ischemia.

## 2019-09-03 ENCOUNTER — Ambulatory Visit: Payer: Medicare Other | Admitting: Orthopedic Surgery

## 2020-01-14 IMAGING — CR DG CHEST 1V PORT
1 series · 1 of 1 positions shown · non-contrast
Comparison: 06/10/2018

CLINICAL DATA: The patient was found down and unresponsive. CPR.

EXAM:
PORTABLE CHEST 1 VIEW

[portable]
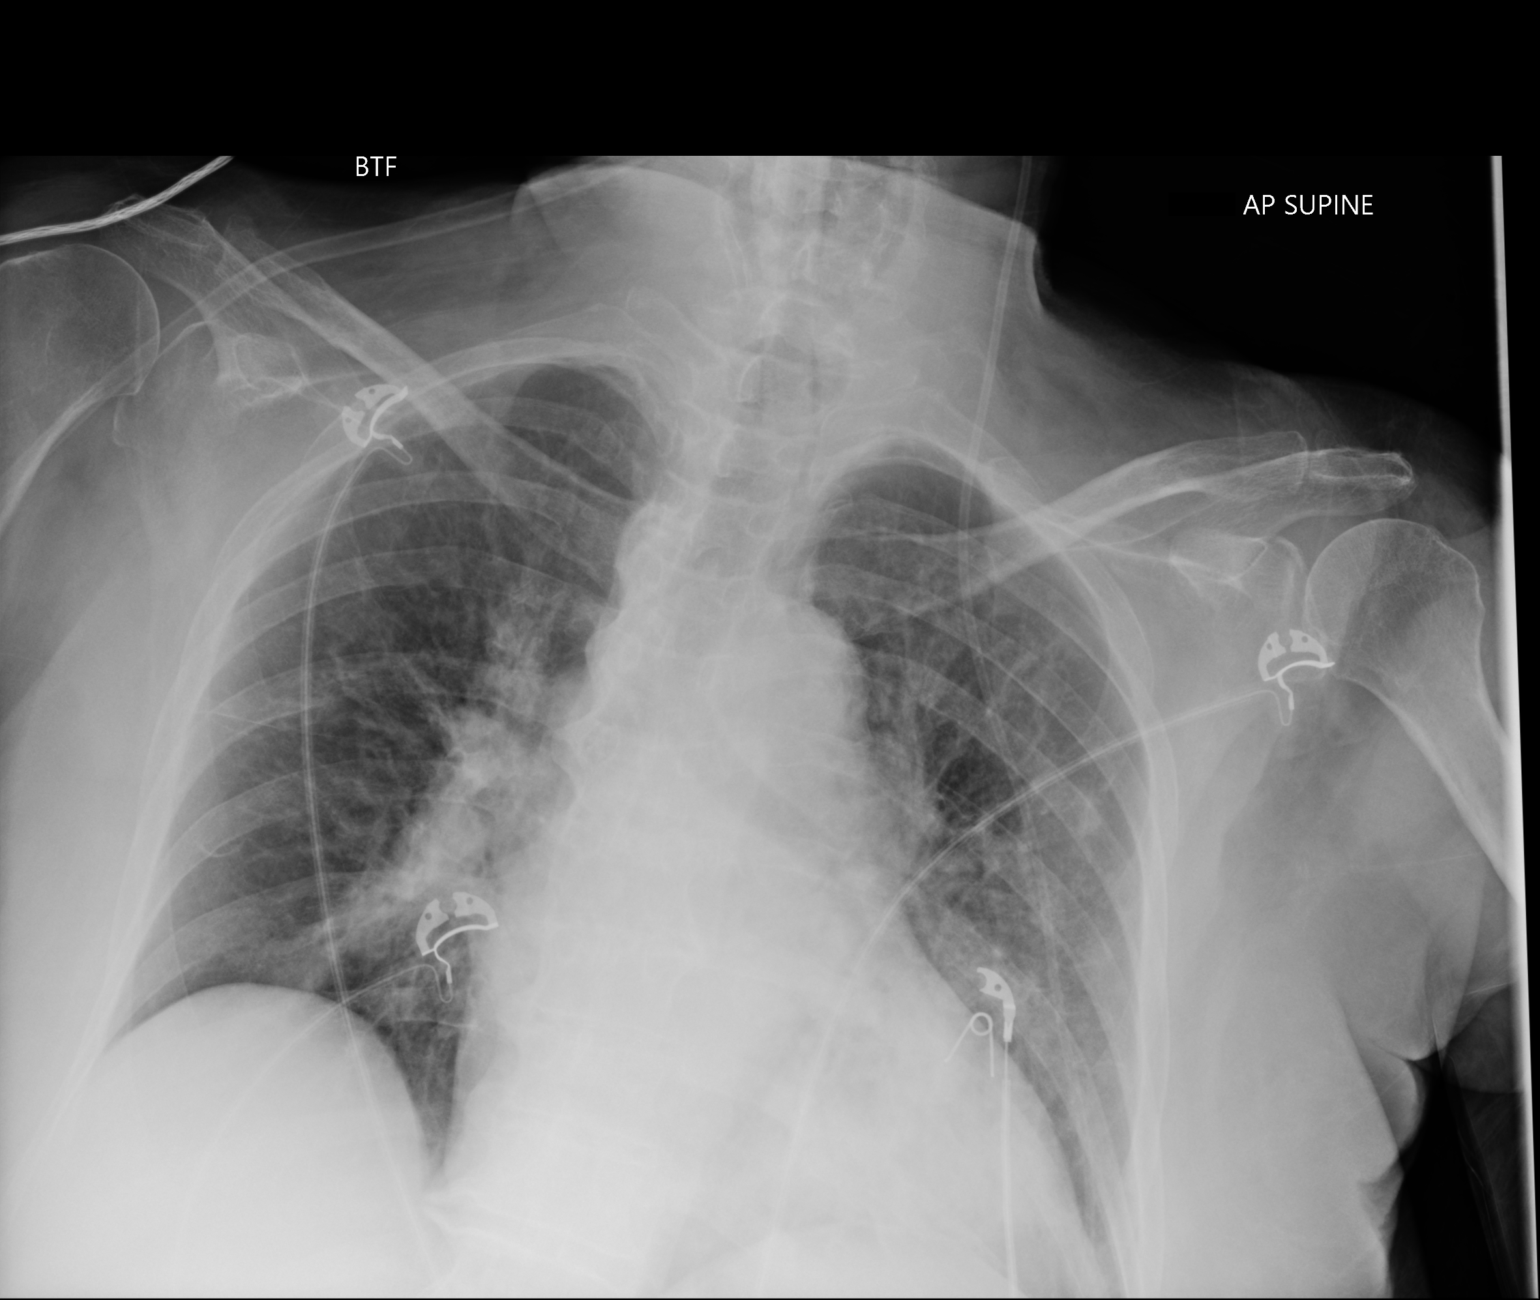

[1 of 1 positions shown; findings below may reference images not displayed]

FINDINGS: Endotracheal tube is at the T2 level just above the thoracic inlet.
Heart size is normal. Slight prominence of the main pulmonary
arteries. Density at the left lung base could represent atelectasis
or infiltrate. No discrete effusions. No acute bone abnormality.
IMPRESSION: Atelectasis or infiltrate at the left lung base. Endotracheal tube
is at the T2 level.
